# Patient Record
Sex: Female | Born: 1950 | Race: White | Hispanic: No | Smoking: Former smoker
Health system: Southern US, Community
[De-identification: ages and names within clinical notes are randomized; demographics above are authoritative.]

## PROBLEM LIST (undated history)

## (undated) DIAGNOSIS — Z972 Presence of dental prosthetic device (complete) (partial): Secondary | ICD-10-CM

## (undated) DIAGNOSIS — G4730 Sleep apnea, unspecified: Secondary | ICD-10-CM

## (undated) DIAGNOSIS — H409 Unspecified glaucoma: Secondary | ICD-10-CM

## (undated) DIAGNOSIS — M1990 Unspecified osteoarthritis, unspecified site: Secondary | ICD-10-CM

## (undated) DIAGNOSIS — R7303 Prediabetes: Secondary | ICD-10-CM

## (undated) DIAGNOSIS — Z98811 Dental restoration status: Secondary | ICD-10-CM

## (undated) DIAGNOSIS — K219 Gastro-esophageal reflux disease without esophagitis: Secondary | ICD-10-CM

## (undated) DIAGNOSIS — Z8489 Family history of other specified conditions: Secondary | ICD-10-CM

## (undated) DIAGNOSIS — J45909 Unspecified asthma, uncomplicated: Secondary | ICD-10-CM

## (undated) DIAGNOSIS — I10 Essential (primary) hypertension: Secondary | ICD-10-CM

## (undated) DIAGNOSIS — I38 Endocarditis, valve unspecified: Secondary | ICD-10-CM

## (undated) DIAGNOSIS — G5603 Carpal tunnel syndrome, bilateral upper limbs: Secondary | ICD-10-CM

## (undated) DIAGNOSIS — T7840XA Allergy, unspecified, initial encounter: Secondary | ICD-10-CM

## (undated) HISTORY — DX: Allergy, unspecified, initial encounter: T78.40XA

## (undated) HISTORY — DX: Unspecified glaucoma: H40.9

## (undated) HISTORY — PX: COLONOSCOPY: SHX174

## (undated) HISTORY — PX: TOOTH EXTRACTION: SUR596

---

## 2003-03-15 HISTORY — PX: IRIDOTOMY / IRIDECTOMY: SHX165

## 2005-01-12 ENCOUNTER — Ambulatory Visit: Payer: Self-pay | Admitting: Unknown Physician Specialty

## 2006-02-22 ENCOUNTER — Ambulatory Visit: Payer: Self-pay | Admitting: Unknown Physician Specialty

## 2006-07-20 ENCOUNTER — Emergency Department: Payer: Self-pay | Admitting: Emergency Medicine

## 2006-07-20 ENCOUNTER — Other Ambulatory Visit: Payer: Self-pay

## 2007-02-13 ENCOUNTER — Ambulatory Visit: Payer: Self-pay | Admitting: Specialist

## 2007-09-06 ENCOUNTER — Ambulatory Visit: Payer: Self-pay | Admitting: Unknown Physician Specialty

## 2007-10-18 ENCOUNTER — Ambulatory Visit: Payer: Self-pay | Admitting: Unknown Physician Specialty

## 2008-09-09 ENCOUNTER — Ambulatory Visit: Payer: Self-pay | Admitting: Unknown Physician Specialty

## 2008-09-18 ENCOUNTER — Ambulatory Visit: Payer: Self-pay | Admitting: Unknown Physician Specialty

## 2008-09-24 ENCOUNTER — Ambulatory Visit: Payer: Self-pay | Admitting: Unknown Physician Specialty

## 2009-11-18 ENCOUNTER — Ambulatory Visit: Payer: Self-pay | Admitting: Unknown Physician Specialty

## 2012-08-08 ENCOUNTER — Ambulatory Visit: Payer: Self-pay | Admitting: Internal Medicine

## 2012-11-16 ENCOUNTER — Ambulatory Visit: Payer: Self-pay | Admitting: Specialist

## 2013-01-21 ENCOUNTER — Ambulatory Visit: Payer: Self-pay | Admitting: Unknown Physician Specialty

## 2013-01-22 LAB — PATHOLOGY REPORT

## 2013-08-29 DIAGNOSIS — G4733 Obstructive sleep apnea (adult) (pediatric): Secondary | ICD-10-CM | POA: Insufficient documentation

## 2013-08-29 DIAGNOSIS — G4730 Sleep apnea, unspecified: Secondary | ICD-10-CM | POA: Insufficient documentation

## 2013-08-29 DIAGNOSIS — J45909 Unspecified asthma, uncomplicated: Secondary | ICD-10-CM | POA: Insufficient documentation

## 2014-03-28 ENCOUNTER — Ambulatory Visit: Payer: Self-pay | Admitting: Internal Medicine

## 2015-02-16 ENCOUNTER — Encounter: Payer: Self-pay | Admitting: Podiatry

## 2015-02-16 ENCOUNTER — Ambulatory Visit (INDEPENDENT_AMBULATORY_CARE_PROVIDER_SITE_OTHER): Payer: Medicare Other | Admitting: Podiatry

## 2015-02-16 VITALS — BP 127/86 | HR 69 | Resp 12

## 2015-02-16 DIAGNOSIS — L600 Ingrowing nail: Secondary | ICD-10-CM

## 2015-02-16 MED ORDER — NEOMYCIN-POLYMYXIN-HC 3.5-10000-1 OT SOLN
OTIC | Status: DC
Start: 2015-02-16 — End: 2016-09-06

## 2015-02-16 NOTE — Patient Instructions (Signed)

## 2015-02-16 NOTE — Progress Notes (Signed)
   Subjective:    Patient ID: Judith Arnold, female    DOB: 10-30-1950, 64 y.o.   MRN: [id number]  HPI: She presents today chief complaint of a painful ingrown toenail to the hallux left. She states it is been painful for the past month. She thinks that she has an ingrown toenail has been trying to treat it with Epsom salts and antibiotic ointments to no avail.    Review of Systems  HENT: Positive for sinus pressure.   Eyes: Positive for visual disturbance.  Respiratory: Positive for cough.   Musculoskeletal: Positive for gait problem.       Objective:   Physical Exam: 64 year old female vital signs stable alert and oriented 3 in no apparent distress. Pulses are strongly palpable. Neurologic sensorium is intact per Semmes-Weinstein monofilament. Deep tendon reflexes are intact. Muscle strength is 5 over 5 dorsiflexion plantar flexors and inverters everters all intrinsic musculature is intact. Orthopedic evaluation demonstrates all joints distal to the ankle for range of motion without crepitation. Cutaneous evaluation demonstrates supple well-hydrated cutis which sharp incurvated nail margin to the tibiofibular border of the hallux left with onychocryptosis. Mild paronychia is noted no purulence and no malodor.        Assessment & Plan:  Assessment ingrown nail paronychia abscess hallux left.  Plan: Discussed etiology pathology conservative versus surgical therapies. Chemical matrixectomy was performed to the tibial and fibular borders of the hallux left today after local anesthesia was diminished. She tolerated this procedure well and was given both oral and an home-going instructions for care and soaking of the toe. She was also provided a prescription for Cortisporin Otic to be applied twice daily after soaking notify us if there are any questions or concerns. Otherwise we will follow-up with her in 1 week.

## 2015-02-23 ENCOUNTER — Encounter: Payer: Self-pay | Admitting: Podiatry

## 2015-02-23 ENCOUNTER — Ambulatory Visit (INDEPENDENT_AMBULATORY_CARE_PROVIDER_SITE_OTHER): Payer: Medicare Other | Admitting: Podiatry

## 2015-02-23 DIAGNOSIS — L600 Ingrowing nail: Secondary | ICD-10-CM

## 2015-02-23 NOTE — Progress Notes (Signed)
She presents today status post matrixectomy hallux left 1 week. She states that it has a scab on it but seems to be doing well. She continues to soak in Betadine and warm water and apply Cortisporin Otic daily.  Objective: Vital signs are stable she is alert and oriented 3. Toe appears to be healing uneventfully. In that there is no erythema no cellulitis drainage or odor. Scab is present it is minimally tender on palpation.  Assessment: Well-healing surgical hallux matrixectomy left foot.  Plan: Discontinue Betadine sterile with Epsom salts and warm water soaks. Cover during the day and leave open at night time. I also encouraged her to continue to soak until this is completely resolved. She will continue to utilize the Cortisporin Otic until it is gone. All up with me should anything change for the worse or should she have questions or concerns.

## 2015-06-03 ENCOUNTER — Other Ambulatory Visit: Payer: Self-pay | Admitting: Internal Medicine

## 2015-06-03 DIAGNOSIS — Z1231 Encounter for screening mammogram for malignant neoplasm of breast: Secondary | ICD-10-CM

## 2015-06-15 ENCOUNTER — Ambulatory Visit: Payer: Self-pay

## 2015-06-16 ENCOUNTER — Ambulatory Visit
Admission: RE | Admit: 2015-06-16 | Discharge: 2015-06-16 | Disposition: A | Discharge status: Home / Self Care | Payer: Medicare Other | Source: Ambulatory Visit | Attending: Internal Medicine | Admitting: Internal Medicine

## 2015-06-16 ENCOUNTER — Other Ambulatory Visit: Payer: Self-pay | Admitting: Internal Medicine

## 2015-06-16 DIAGNOSIS — Z1231 Encounter for screening mammogram for malignant neoplasm of breast: Secondary | ICD-10-CM

## 2015-07-20 ENCOUNTER — Other Ambulatory Visit: Payer: Self-pay | Admitting: Nurse Practitioner

## 2015-07-20 DIAGNOSIS — R1310 Dysphagia, unspecified: Secondary | ICD-10-CM

## 2015-08-03 ENCOUNTER — Ambulatory Visit: Payer: Medicare Other

## 2015-08-31 ENCOUNTER — Ambulatory Visit
Admission: RE | Admit: 2015-08-31 | Discharge: 2015-08-31 | Disposition: A | Discharge status: Home / Self Care | Payer: Medicare Other | Source: Ambulatory Visit | Attending: Nurse Practitioner | Admitting: Nurse Practitioner

## 2015-08-31 DIAGNOSIS — R1310 Dysphagia, unspecified: Secondary | ICD-10-CM

## 2015-08-31 DIAGNOSIS — R1312 Dysphagia, oropharyngeal phase: Secondary | ICD-10-CM

## 2015-08-31 NOTE — Therapy (Signed)
[facility] DIAGNOSTIC RADIOLOGY [address] [place], [place], [address] Phone: [phone]   Fax:     Modified Barium Swallow  Patient Details  Name: Hennie DuosBarbara B Denicola MRN: [id number] Date of Birth: 1950/04/27 No Data Recorded  Encounter Date: 08/31/2015      End of Session - 08/31/15 1335    Visit Number 1   Number of Visits 1   Date for SLP Re-Evaluation 08/31/15   SLP Start Time 1230   SLP Stop Time  1330   SLP Time Calculation (min) 60 min   Activity Tolerance Patient tolerated treatment well      No past medical history on file.  No past surgical history on file.  There were no vitals filed for this visit.    Subjective: Patient behavior: (alertness, ability to follow instructions, etc.): Patient able to reoprt swallowing complaints and follow directions for this evaluation.  Chief complaint: coughing associated with drinking- usually in combination with another activity such as talking/laughing or turning her head.  The patient's subjective perception is aspiration into the lungs.     Objective:  Radiological Procedure: A videoflouroscopic evaluation of oral-preparatory, reflex initiation, and pharyngeal phases of the swallow was performed; as well as a screening of the upper esophageal phase.  I. POSTURE: Upright in MBS chair  II. VIEW: lateral  III. COMPENSATORY STRATEGIES: N/A  IV. BOLUSES ADMINISTERED:    Thin Liquid: 2 small sips; 3 rapid, consecutive sips; 2 sips with head turn   Nectar-thick Liquid: 1 moderate size sip    Puree: 2 teaspoon presentations   Mechanical Soft: 1/4 graham cracker in apple sauce  V. RESULTS OF EVALUATION: A. ORAL PREPARATORY PHASE: (The lips, tongue, and velum are observed for strength and coordination)       **Overall Severity Rating: Within normal limits  B. SWALLOW INITIATION/REFLEX: (The reflex is normal if "triggered" by the time the bolus reached the base of the  tongue)  **Overall Severity Rating: Mild; triggering at the valleculae for thick consistencies and while falling from the valleculae to the pyriform sinuses with thin liquid  C. PHARYNGEAL PHASE: (Pharyngeal function is normal if the bolus shows rapid, smooth, and continuous transit through the pharynx and there is no pharyngeal residue after the swallow)  **Overall Severity Rating: Within normal limits  D. LARYNGEAL PENETRATION: (Material entering into the laryngeal inlet/vestibule but not aspirated) None  E. ASPIRATION: None  F. ESOPHAGEAL PHASE: (Screening of the upper esophagus) In the cervical esophagus there is a finger-like protrusion along the posterior wall during swallow (does not impede flow of boluses) consistent with prominent cricopharyngeus.    ASSESSMENT: 65 year old woman, with coughing while drinking (patient perceives aspiration), is presenting with minimal oropharyngeal dysphagia.  Oral control of the bolus including oral hold, rotary mastication, and anterior to posterior transfer are within normal limits. Timing of the pharyngeal swallow is delayed, triggering at the valleculae for thick consistencies and while falling from the valleculae to the pyriform sinuses with thin liquid.   Pharyngeal stage of swallowing including tongue base retraction, hyolaryngeal excursion, epiglottic inversion, duration/amplitude of UES opening, and laryngeal vestibule closure at the height of the swallow are within normal limits.  There was no observed pharyngeal residue, laryngeal penetration, or aspiration.  The patient attempted to elicit the cough by turning her head and swallowing, but she did not cough.  In the cervical esophagus there is a finger-like protrusion along the posterior wall during swallow (does not impede flow  of boluses) consistent with prominent cricopharyngeus.  I do not think the patient's complaints are due to aspiration.  The patient may be experiencing laryngospasms.   Recommend referral to ENT for direct visualization of the pharynx/larynx.   PLAN/RECOMMENDATIONS:   A. Diet: Regular   B. Swallowing Precautions: N/A   C. Recommended consultation to: ENT   D. Therapy recommendations: N/A   E. Results and recommendations were discussed with the patient immediately following the study and the final report routed to the referring practitioner.         Oropharyngeal dysphagia  Dysphagia - Plan: DG OP Swallowing Func-Medicare/Speech Path, DG OP Swallowing Func-Medicare/Speech Path      G-Codes - September 05, 2015 1336    Functional Assessment Tool Used MBS, clinical judgment   Functional Limitations Swallowing   Swallow Current Status (Z6109) At least 1 percent but less than 20 percent impaired, limited or restricted   Swallow Goal Status (U0454) At least 1 percent but less than 20 percent impaired, limited or restricted   Swallow Discharge Status [phone]) At least 1 percent but less than 20 percent impaired, limited or restricted          Problem List Patient Active Problem List   Diagnosis Date Noted  . Airway hyperreactivity 08/29/2013  . Apnea, sleep 08/29/2013   Dollene Primrose, MS/CCC- SLP  Leandrew Koyanagi 09/05/2015, 1:37 PM   [facility] DIAGNOSTIC RADIOLOGY [address] [place], [place], [address] Phone: [phone]   Fax:     Name: JAMESA TEDRICK MRN: [id number] Date of Birth: 05-Dec-1950

## 2016-04-08 ENCOUNTER — Other Ambulatory Visit: Payer: Self-pay | Admitting: Internal Medicine

## 2016-04-08 DIAGNOSIS — Z1231 Encounter for screening mammogram for malignant neoplasm of breast: Secondary | ICD-10-CM

## 2016-06-20 ENCOUNTER — Ambulatory Visit: Payer: Medicare Other

## 2016-06-21 ENCOUNTER — Ambulatory Visit
Admission: RE | Admit: 2016-06-21 | Discharge: 2016-06-21 | Disposition: A | Discharge status: Home / Self Care | Payer: Medicare Other | Source: Ambulatory Visit | Attending: Internal Medicine | Admitting: Internal Medicine

## 2016-06-21 DIAGNOSIS — Z1231 Encounter for screening mammogram for malignant neoplasm of breast: Secondary | ICD-10-CM | POA: Diagnosis not present

## 2016-09-06 ENCOUNTER — Encounter: Payer: Self-pay | Admitting: *Deleted

## 2016-09-07 NOTE — Discharge Instructions (Signed)

## 2016-09-12 MED ORDER — SEVOFLURANE IN SOLN
RESPIRATORY_TRACT | Status: AC
  Filled 2016-09-12: qty 250

## 2016-09-14 MED ORDER — PROPOFOL 10 MG/ML IV BOLUS
INTRAVENOUS | Status: AC
  Filled 2016-09-14: qty 20

## 2016-11-09 ENCOUNTER — Encounter: Payer: Self-pay | Admitting: *Deleted

## 2016-11-16 ENCOUNTER — Ambulatory Visit: Payer: Medicare Other | Admitting: Anesthesiology

## 2016-11-16 ENCOUNTER — Ambulatory Visit
Admission: RE | Admit: 2016-11-16 | Discharge: 2016-11-16 | Disposition: A | Discharge status: Home / Self Care | Payer: Medicare Other | Source: Ambulatory Visit | Attending: Ophthalmology | Admitting: Ophthalmology

## 2016-11-16 ENCOUNTER — Encounter
Admission: RE | Disposition: A | Discharge status: Home / Self Care | Payer: Self-pay | Source: Ambulatory Visit | Attending: Ophthalmology

## 2016-11-16 DIAGNOSIS — Z87891 Personal history of nicotine dependence: Secondary | ICD-10-CM | POA: Diagnosis not present

## 2016-11-16 DIAGNOSIS — K219 Gastro-esophageal reflux disease without esophagitis: Secondary | ICD-10-CM | POA: Diagnosis not present

## 2016-11-16 DIAGNOSIS — J45909 Unspecified asthma, uncomplicated: Secondary | ICD-10-CM | POA: Insufficient documentation

## 2016-11-16 DIAGNOSIS — I10 Essential (primary) hypertension: Secondary | ICD-10-CM | POA: Diagnosis not present

## 2016-11-16 DIAGNOSIS — K4090 Unilateral inguinal hernia, without obstruction or gangrene, not specified as recurrent: Secondary | ICD-10-CM | POA: Diagnosis not present

## 2016-11-16 DIAGNOSIS — Z888 Allergy status to other drugs, medicaments and biological substances status: Secondary | ICD-10-CM | POA: Diagnosis not present

## 2016-11-16 DIAGNOSIS — M1990 Unspecified osteoarthritis, unspecified site: Secondary | ICD-10-CM | POA: Insufficient documentation

## 2016-11-16 DIAGNOSIS — Z91040 Latex allergy status: Secondary | ICD-10-CM | POA: Insufficient documentation

## 2016-11-16 DIAGNOSIS — R229 Localized swelling, mass and lump, unspecified: Secondary | ICD-10-CM | POA: Insufficient documentation

## 2016-11-16 DIAGNOSIS — H2511 Age-related nuclear cataract, right eye: Secondary | ICD-10-CM | POA: Insufficient documentation

## 2016-11-16 DIAGNOSIS — E7800 Pure hypercholesterolemia, unspecified: Secondary | ICD-10-CM | POA: Insufficient documentation

## 2016-11-16 DIAGNOSIS — G4730 Sleep apnea, unspecified: Secondary | ICD-10-CM | POA: Insufficient documentation

## 2016-11-16 DIAGNOSIS — E1136 Type 2 diabetes mellitus with diabetic cataract: Secondary | ICD-10-CM | POA: Diagnosis not present

## 2016-11-16 DIAGNOSIS — Z6841 Body Mass Index (BMI) 40.0 and over, adult: Secondary | ICD-10-CM | POA: Diagnosis not present

## 2016-11-16 HISTORY — DX: Dental restoration status: Z98.811

## 2016-11-16 HISTORY — DX: Sleep apnea, unspecified: G47.30

## 2016-11-16 HISTORY — DX: Carpal tunnel syndrome, bilateral upper limbs: G56.03

## 2016-11-16 HISTORY — DX: Gastro-esophageal reflux disease without esophagitis: K21.9

## 2016-11-16 HISTORY — DX: Prediabetes: R73.03

## 2016-11-16 HISTORY — PX: CATARACT EXTRACTION W/PHACO: SHX586

## 2016-11-16 HISTORY — DX: Unspecified asthma, uncomplicated: J45.909

## 2016-11-16 HISTORY — DX: Essential (primary) hypertension: I10

## 2016-11-16 HISTORY — DX: Unspecified osteoarthritis, unspecified site: M19.90

## 2016-11-16 HISTORY — DX: Family history of other specified conditions: Z84.89

## 2016-11-16 HISTORY — DX: Endocarditis, valve unspecified: I38

## 2016-11-16 HISTORY — DX: Presence of dental prosthetic device (complete) (partial): Z97.2

## 2016-11-16 LAB — GLUCOSE, CAPILLARY
Glucose-Capillary: 107 mg/dL — ABNORMAL HIGH (ref 65–99)
Glucose-Capillary: 116 mg/dL — ABNORMAL HIGH (ref 65–99)

## 2016-11-16 SURGERY — PHACOEMULSIFICATION, CATARACT, WITH IOL INSERTION
Anesthesia: Monitor Anesthesia Care | Laterality: Right | Wound class: Clean

## 2016-11-16 MED ORDER — MOXIFLOXACIN HCL 0.5 % OP SOLN
1.0000 [drp] | OPHTHALMIC | Status: DC | PRN
  Administered 2016-11-16 (×3): 1 [drp] via OPHTHALMIC

## 2016-11-16 MED ORDER — CEFUROXIME OPHTHALMIC INJECTION 1 MG/0.1 ML
INJECTION | OPHTHALMIC | Status: DC | PRN
  Administered 2016-11-16: 0.1 mL via OPHTHALMIC

## 2016-11-16 MED ORDER — BRIMONIDINE TARTRATE-TIMOLOL 0.2-0.5 % OP SOLN
OPHTHALMIC | Status: DC | PRN
  Administered 2016-11-16: 1 [drp] via OPHTHALMIC

## 2016-11-16 MED ORDER — ACETAMINOPHEN 325 MG PO TABS: 650.0000 mg | ORAL_TABLET | Freq: Once | ORAL | Status: DC | PRN

## 2016-11-16 MED ORDER — LIDOCAINE HCL (PF) 2 % IJ SOLN
INTRAMUSCULAR | Status: DC | PRN
  Administered 2016-11-16: 1 mL via INTRAOCULAR

## 2016-11-16 MED ORDER — ARMC OPHTHALMIC DILATING DROPS: 1.0000 "application " | OPHTHALMIC | Status: DC | PRN

## 2016-11-16 MED ORDER — ARMC OPHTHALMIC DILATING DROPS
1.0000 "application " | OPHTHALMIC | Status: DC | PRN
  Administered 2016-11-16 (×3): 1 via OPHTHALMIC

## 2016-11-16 MED ORDER — ONDANSETRON HCL 4 MG/2ML IJ SOLN: 4.0000 mg | Freq: Once | INTRAMUSCULAR | Status: DC | PRN

## 2016-11-16 MED ORDER — ACETAMINOPHEN 160 MG/5ML PO SOLN: 325.0000 mg | ORAL | Status: DC | PRN

## 2016-11-16 MED ORDER — FENTANYL CITRATE (PF) 100 MCG/2ML IJ SOLN
INTRAMUSCULAR | Status: DC | PRN
  Administered 2016-11-16: 50 ug via INTRAVENOUS

## 2016-11-16 MED ORDER — MOXIFLOXACIN HCL 0.5 % OP SOLN: 1.0000 [drp] | OPHTHALMIC | Status: DC | PRN

## 2016-11-16 MED ORDER — NA HYALUR & NA CHOND-NA HYALUR 0.4-0.35 ML IO KIT
PACK | INTRAOCULAR | Status: DC | PRN
  Administered 2016-11-16: 1 mL via INTRAOCULAR

## 2016-11-16 MED ORDER — MIDAZOLAM HCL 2 MG/2ML IJ SOLN
INTRAMUSCULAR | Status: DC | PRN
  Administered 2016-11-16: 2 mg via INTRAVENOUS

## 2016-11-16 MED ORDER — LACTATED RINGERS IV SOLN: INTRAVENOUS | Status: DC

## 2016-11-16 MED ORDER — EPINEPHRINE PF 1 MG/ML IJ SOLN
INTRAMUSCULAR | Status: DC | PRN
  Administered 2016-11-16: 64 mL via OPHTHALMIC

## 2016-11-16 SURGICAL SUPPLY — 27 items
CANNULA ANT/CHMB 27G (MISCELLANEOUS) ×1 IMPLANT
CANNULA ANT/CHMB 27GA (MISCELLANEOUS) ×2 IMPLANT
CARTRIDGE ABBOTT (MISCELLANEOUS) IMPLANT
GLOVE SURG LX 7.5 STRW (GLOVE) ×1
GLOVE SURG LX STRL 7.5 STRW (GLOVE) ×1 IMPLANT
GLOVE SURG TRIUMPH 8.0 PF LTX (GLOVE) ×2 IMPLANT
GOWN STRL REUS W/ TWL LRG LVL3 (GOWN DISPOSABLE) ×2 IMPLANT
GOWN STRL REUS W/TWL LRG LVL3 (GOWN DISPOSABLE) ×4
LENS IOL TECNIS ITEC 24.0 (Intraocular Lens) ×1 IMPLANT
MARKER SKIN DUAL TIP RULER LAB (MISCELLANEOUS) ×2 IMPLANT
NDL FILTER BLUNT 18X1 1/2 (NEEDLE) ×1 IMPLANT
NDL RETROBULBAR .5 NSTRL (NEEDLE) IMPLANT
NEEDLE FILTER BLUNT 18X 1/2SAF (NEEDLE) ×1
NEEDLE FILTER BLUNT 18X1 1/2 (NEEDLE) ×1 IMPLANT
PACK CATARACT BRASINGTON (MISCELLANEOUS) ×2 IMPLANT
PACK EYE AFTER SURG (MISCELLANEOUS) ×2 IMPLANT
PACK OPTHALMIC (MISCELLANEOUS) ×2 IMPLANT
RING MALYGIN 7.0 (MISCELLANEOUS) IMPLANT
SUT ETHILON 10-0 CS-B-6CS-B-6 (SUTURE)
SUT VICRYL  9 0 (SUTURE)
SUT VICRYL 9 0 (SUTURE) IMPLANT
SUTURE EHLN 10-0 CS-B-6CS-B-6 (SUTURE) IMPLANT
SYR 3ML LL SCALE MARK (SYRINGE) ×2 IMPLANT
SYR 5ML LL (SYRINGE) ×2 IMPLANT
SYR TB 1ML LUER SLIP (SYRINGE) ×2 IMPLANT
WATER STERILE IRR 250ML POUR (IV SOLUTION) ×2 IMPLANT
WIPE NON LINTING 3.25X3.25 (MISCELLANEOUS) ×2 IMPLANT

## 2016-11-16 NOTE — Anesthesia Postprocedure Evaluation (Signed)
Anesthesia Post Note  Patient: Judith Arnold  Procedure(s) Performed: Procedure(s) (LRB): CATARACT EXTRACTION PHACO AND INTRAOCULAR LENS PLACEMENT (IOC)  right (Right)  Patient location during evaluation: PACU Anesthesia Type: MAC Level of consciousness: awake Pain management: pain level controlled Vital Signs Assessment: post-procedure vital signs reviewed and stable Respiratory status: spontaneous breathing, nonlabored ventilation and respiratory function stable Cardiovascular status: stable Postop Assessment: no signs of nausea or vomiting Anesthetic complications: no    Veda Canning

## 2016-11-16 NOTE — H&P (Signed)
The History and Physical notes are on paper, have been signed, and are to be scanned. The patient remains stable and unchanged from the H&P.   Previous H&P reviewed, patient examined, and there are no changes.  Judith Arnold 11/16/2016 8:45 AM

## 2016-11-16 NOTE — Anesthesia Procedure Notes (Signed)
Procedure Name: MAC Performed by: Jahlen Bollman Pre-anesthesia Checklist: Patient identified, Emergency Drugs available, Suction available, Timeout performed and Patient being monitored Patient Re-evaluated:Patient Re-evaluated prior to inductionOxygen Delivery Method: Nasal cannula Placement Confirmation: positive ETCO2     

## 2016-11-16 NOTE — Op Note (Signed)
LOCATION:  [facility]   PREOPERATIVE DIAGNOSIS:    Nuclear sclerotic cataract right eye. H25.11   POSTOPERATIVE DIAGNOSIS:  Nuclear sclerotic cataract right eye.     PROCEDURE:  Phacoemusification with posterior chamber intraocular lens placement of the right eye   LENS:   Implant Name Type Inv. Item Serial No. Manufacturer Lot No. LRB No. Used  LENS IOL DIOP 24.0 - [id number] Intraocular Lens LENS IOL DIOP 24.0 [id number]   Right 1        ULTRASOUND TIME: 16 % of 1 minutes, 19 seconds.  CDE 12.7   SURGEON:  Deirdre Evenerhadwick R. Ioane Bhola, MD   ANESTHESIA:  Topical with tetracaine drops and 2% Xylocaine jelly, augmented with 1% preservative-free intracameral lidocaine.    COMPLICATIONS:  None.   DESCRIPTION OF PROCEDURE:  The patient was identified in the holding room and transported to the operating room and placed in the supine position under the operating microscope.  The right eye was identified as the operative eye and it was prepped and draped in the usual sterile ophthalmic fashion.   A 1 millimeter clear-corneal paracentesis was made at the 12:00 position.  0.5 ml of preservative-free 1% lidocaine was injected into the anterior chamber. The anterior chamber was filled with Viscoat viscoelastic.  A 2.4 millimeter keratome was used to make a near-clear corneal incision at the 9:00 position.  A curvilinear capsulorrhexis was made with a cystotome and capsulorrhexis forceps.  Balanced salt solution was used to hydrodissect and hydrodelineate the nucleus.   Phacoemulsification was then used in stop and chop fashion to remove the lens nucleus and epinucleus.  The remaining cortex was then removed using the irrigation and aspiration handpiece. Provisc was then placed into the capsular bag to distend it for lens placement.  A lens was then injected into the capsular bag.  The remaining viscoelastic was aspirated.   Wounds were hydrated with balanced salt solution.  The anterior  chamber was inflated to a physiologic pressure with balanced salt solution.  No wound leaks were noted. Cefuroxime 0.1 ml of a 10mg /ml solution was injected into the anterior chamber for a dose of 1 mg of intracameral antibiotic at the completion of the case.   Timolol and Brimonidine drops were applied to the eye.  The patient was taken to the recovery room in stable condition without complications of anesthesia or surgery.   Kelis Plasse 11/16/2016, 10:40 AM

## 2016-11-16 NOTE — Anesthesia Preprocedure Evaluation (Addendum)
Anesthesia Evaluation  Patient identified by MRN, date of birth, ID band Patient awake    Reviewed: Allergy & Precautions, NPO status , Patient's Chart, lab work & pertinent test results  Airway Mallampati: II  TM Distance: >3 FB     Dental   Pulmonary asthma , sleep apnea , former smoker,    breath sounds clear to auscultation       Cardiovascular hypertension,  Rhythm:Regular Rate:Normal     Neuro/Psych    GI/Hepatic GERD  ,  Endo/Other  Morbid obesity  Renal/GU      Musculoskeletal  (+) Arthritis ,   Abdominal   Peds  Hematology   Anesthesia Other Findings   Reproductive/Obstetrics                            Anesthesia Physical Anesthesia Plan  ASA: III  Anesthesia Plan: MAC   Post-op Pain Management:    Induction:   PONV Risk Score and Plan:   Airway Management Planned:   Additional Equipment:   Intra-op Plan:   Post-operative Plan:   Informed Consent: I have reviewed the patients History and Physical, chart, labs and discussed the procedure including the risks, benefits and alternatives for the proposed anesthesia with the patient or authorized representative who has indicated his/her understanding and acceptance.     Plan Discussed with: CRNA  Anesthesia Plan Comments:         Anesthesia Quick Evaluation

## 2016-11-16 NOTE — Transfer of Care (Signed)
Immediate Anesthesia Transfer of Care Note  Patient: Judith Arnold  Procedure(s) Performed: Procedure(s) with comments: CATARACT EXTRACTION PHACO AND INTRAOCULAR LENS PLACEMENT (IOC)  right (Right) - pre diabetic latex sensitivity sleep apnea  Patient Location: PACU  Anesthesia Type: MAC  Level of Consciousness: awake, alert  and patient cooperative  Airway and Oxygen Therapy: Patient Spontanous Breathing and Patient connected to supplemental oxygen  Post-op Assessment: Post-op Vital signs reviewed, Patient's Cardiovascular Status Stable, Respiratory Function Stable, Patent Airway and No signs of Nausea or vomiting  Post-op Vital Signs: Reviewed and stable  Complications: No apparent anesthesia complications

## 2016-11-17 ENCOUNTER — Encounter: Payer: Self-pay | Admitting: Ophthalmology

## 2017-01-12 NOTE — Discharge Instructions (Signed)

## 2017-01-18 ENCOUNTER — Ambulatory Visit: Payer: Medicare Other | Admitting: Anesthesiology

## 2017-01-18 ENCOUNTER — Ambulatory Visit
Admission: RE | Admit: 2017-01-18 | Discharge: 2017-01-18 | Disposition: A | Discharge status: Home / Self Care | Payer: Medicare Other | Source: Ambulatory Visit | Attending: Ophthalmology | Admitting: Ophthalmology

## 2017-01-18 ENCOUNTER — Encounter
Admission: RE | Disposition: A | Discharge status: Home / Self Care | Payer: Self-pay | Source: Ambulatory Visit | Attending: Ophthalmology

## 2017-01-18 DIAGNOSIS — I10 Essential (primary) hypertension: Secondary | ICD-10-CM | POA: Diagnosis not present

## 2017-01-18 DIAGNOSIS — J45909 Unspecified asthma, uncomplicated: Secondary | ICD-10-CM | POA: Diagnosis not present

## 2017-01-18 DIAGNOSIS — K219 Gastro-esophageal reflux disease without esophagitis: Secondary | ICD-10-CM | POA: Insufficient documentation

## 2017-01-18 DIAGNOSIS — H2512 Age-related nuclear cataract, left eye: Secondary | ICD-10-CM | POA: Diagnosis not present

## 2017-01-18 DIAGNOSIS — G4730 Sleep apnea, unspecified: Secondary | ICD-10-CM | POA: Diagnosis not present

## 2017-01-18 DIAGNOSIS — Z87891 Personal history of nicotine dependence: Secondary | ICD-10-CM | POA: Insufficient documentation

## 2017-01-18 HISTORY — PX: CATARACT EXTRACTION W/PHACO: SHX586

## 2017-01-18 LAB — GLUCOSE, CAPILLARY
Glucose-Capillary: 112 mg/dL — ABNORMAL HIGH (ref 65–99)
Glucose-Capillary: 121 mg/dL — ABNORMAL HIGH (ref 65–99)

## 2017-01-18 SURGERY — PHACOEMULSIFICATION, CATARACT, WITH IOL INSERTION
Anesthesia: Topical | Laterality: Left | Wound class: Clean

## 2017-01-18 MED ORDER — BRIMONIDINE TARTRATE-TIMOLOL 0.2-0.5 % OP SOLN
OPHTHALMIC | Status: DC | PRN
  Administered 2017-01-18: 1 [drp] via OPHTHALMIC

## 2017-01-18 MED ORDER — LIDOCAINE HCL (PF) 2 % IJ SOLN
INTRAOCULAR | Status: DC | PRN
  Administered 2017-01-18: 1 mL

## 2017-01-18 MED ORDER — NA HYALUR & NA CHOND-NA HYALUR 0.4-0.35 ML IO KIT
PACK | INTRAOCULAR | Status: DC | PRN
  Administered 2017-01-18: 1 mL via INTRAOCULAR

## 2017-01-18 MED ORDER — MOXIFLOXACIN HCL 0.5 % OP SOLN
1.0000 [drp] | OPHTHALMIC | Status: DC | PRN
  Administered 2017-01-18 (×3): 1 [drp] via OPHTHALMIC

## 2017-01-18 MED ORDER — MIDAZOLAM HCL 2 MG/2ML IJ SOLN
INTRAMUSCULAR | Status: DC | PRN
  Administered 2017-01-18: 2 mg via INTRAVENOUS

## 2017-01-18 MED ORDER — ARMC OPHTHALMIC DILATING DROPS
1.0000 "application " | OPHTHALMIC | Status: DC | PRN
  Administered 2017-01-18 (×3): 1 via OPHTHALMIC

## 2017-01-18 MED ORDER — LACTATED RINGERS IV SOLN: 1000.0000 mL | INTRAVENOUS | Status: DC

## 2017-01-18 MED ORDER — FENTANYL CITRATE (PF) 100 MCG/2ML IJ SOLN
INTRAMUSCULAR | Status: DC | PRN
  Administered 2017-01-18: 50 ug via INTRAVENOUS

## 2017-01-18 MED ORDER — CEFUROXIME OPHTHALMIC INJECTION 1 MG/0.1 ML
INJECTION | OPHTHALMIC | Status: DC | PRN
  Administered 2017-01-18: 0.1 mL via INTRACAMERAL

## 2017-01-18 SURGICAL SUPPLY — 27 items
CANNULA ANT/CHMB 27G (MISCELLANEOUS) ×1 IMPLANT
CANNULA ANT/CHMB 27GA (MISCELLANEOUS) ×2 IMPLANT
CARTRIDGE ABBOTT (MISCELLANEOUS) IMPLANT
GLOVE SURG LX 7.5 STRW (GLOVE) ×1
GLOVE SURG LX STRL 7.5 STRW (GLOVE) ×1 IMPLANT
GLOVE SURG TRIUMPH 8.0 PF LTX (GLOVE) ×2 IMPLANT
GOWN STRL REUS W/ TWL LRG LVL3 (GOWN DISPOSABLE) ×2 IMPLANT
GOWN STRL REUS W/TWL LRG LVL3 (GOWN DISPOSABLE) ×4
LENS IOL TECNIS ITEC 24.5 (Intraocular Lens) ×1 IMPLANT
MARKER SKIN DUAL TIP RULER LAB (MISCELLANEOUS) ×2 IMPLANT
NDL FILTER BLUNT 18X1 1/2 (NEEDLE) ×1 IMPLANT
NDL RETROBULBAR .5 NSTRL (NEEDLE) IMPLANT
NEEDLE FILTER BLUNT 18X 1/2SAF (NEEDLE) ×1
NEEDLE FILTER BLUNT 18X1 1/2 (NEEDLE) ×1 IMPLANT
PACK CATARACT BRASINGTON (MISCELLANEOUS) ×2 IMPLANT
PACK EYE AFTER SURG (MISCELLANEOUS) ×2 IMPLANT
PACK OPTHALMIC (MISCELLANEOUS) ×2 IMPLANT
RING MALYGIN 7.0 (MISCELLANEOUS) IMPLANT
SUT ETHILON 10-0 CS-B-6CS-B-6 (SUTURE)
SUT VICRYL  9 0 (SUTURE)
SUT VICRYL 9 0 (SUTURE) IMPLANT
SUTURE EHLN 10-0 CS-B-6CS-B-6 (SUTURE) IMPLANT
SYR 3ML LL SCALE MARK (SYRINGE) ×2 IMPLANT
SYR 5ML LL (SYRINGE) ×2 IMPLANT
SYR TB 1ML LUER SLIP (SYRINGE) ×2 IMPLANT
WATER STERILE IRR 250ML POUR (IV SOLUTION) ×2 IMPLANT
WIPE NON LINTING 3.25X3.25 (MISCELLANEOUS) ×2 IMPLANT

## 2017-01-18 NOTE — Anesthesia Postprocedure Evaluation (Signed)
Anesthesia Post Note  Patient: Judith Arnold  Procedure(s) Performed: CATARACT EXTRACTION PHACO AND INTRAOCULAR LENS PLACEMENT (IOC) LEFT DIABETIC (Left )  Patient location during evaluation: PACU Anesthesia Type: MAC Level of consciousness: awake and alert Pain management: pain level controlled Vital Signs Assessment: post-procedure vital signs reviewed and stable Respiratory status: spontaneous breathing, nonlabored ventilation, respiratory function stable and patient connected to nasal cannula oxygen Cardiovascular status: stable and blood pressure returned to baseline Postop Assessment: no apparent nausea or vomiting Anesthetic complications: no    Alisa Graff

## 2017-01-18 NOTE — Op Note (Signed)
OPERATIVE NOTE  Judith DuosBarbara B Makar [id number] 01/18/2017   PREOPERATIVE DIAGNOSIS:  Nuclear sclerotic cataract left eye. H25.12   POSTOPERATIVE DIAGNOSIS:    Nuclear sclerotic cataract left eye.     PROCEDURE:  Phacoemusification with posterior chamber intraocular lens placement of the left eye   LENS:   Implant Name Type Inv. Item Serial No. Manufacturer Lot No. LRB No. Used  LENS IOL DIOP 24.5 - [id number] Intraocular Lens LENS IOL DIOP 24.5 [id number]  Left 1        ULTRASOUND TIME: 16  % of 1 minutes 14 seconds, CDE 12.0  SURGEON:  Deirdre Evenerhadwick R. Kylan Liberati, MD   ANESTHESIA:  Topical with tetracaine drops and 2% Xylocaine jelly, augmented with 1% preservative-free intracameral lidocaine.    COMPLICATIONS:  None.   DESCRIPTION OF PROCEDURE:  The patient was identified in the holding room and transported to the operating room and placed in the supine position under the operating microscope.  The left eye was identified as the operative eye and it was prepped and draped in the usual sterile ophthalmic fashion.   A 1 millimeter clear-corneal paracentesis was made at the 1:30 position.  0.5 ml of preservative-free 1% lidocaine was injected into the anterior chamber.  The anterior chamber was filled with Viscoat viscoelastic.  A 2.4 millimeter keratome was used to make a near-clear corneal incision at the 10:30 position.  .  A curvilinear capsulorrhexis was made with a cystotome and capsulorrhexis forceps.  Balanced salt solution was used to hydrodissect and hydrodelineate the nucleus.   Phacoemulsification was then used in stop and chop fashion to remove the lens nucleus and epinucleus.  The remaining cortex was then removed using the irrigation and aspiration handpiece. Provisc was then placed into the capsular bag to distend it for lens placement.  A lens was then injected into the capsular bag.  The remaining viscoelastic was aspirated.   Wounds were hydrated with balanced salt  solution.  The anterior chamber was inflated to a physiologic pressure with balanced salt solution.  No wound leaks were noted. Cefuroxime 0.1 ml of a 10mg /ml solution was injected into the anterior chamber for a dose of 1 mg of intracameral antibiotic at the completion of the case.   Timolol and Brimonidine drops were applied to the eye.  The patient was taken to the recovery room in stable condition without complications of anesthesia or surgery.  [name] 01/18/2017, 8:09 AM

## 2017-01-18 NOTE — Anesthesia Preprocedure Evaluation (Signed)
Anesthesia Evaluation  Patient identified by MRN, date of birth, ID band Patient awake    Reviewed: Allergy & Precautions, H&P , NPO status , Patient's Chart, lab work & pertinent test results, reviewed documented beta blocker date and time   Airway Mallampati: II  TM Distance: >3 FB Neck ROM: full    Dental no notable dental hx.    Pulmonary asthma , sleep apnea , former smoker,    Pulmonary exam normal breath sounds clear to auscultation       Cardiovascular Exercise Tolerance: Good hypertension, negative cardio ROS   Rhythm:regular Rate:Normal     Neuro/Psych negative neurological ROS  negative psych ROS   GI/Hepatic Neg liver ROS, GERD  ,  Endo/Other  negative endocrine ROS  Renal/GU negative Renal ROS  negative genitourinary   Musculoskeletal   Abdominal   Peds  Hematology negative hematology ROS (+)   Anesthesia Other Findings   Reproductive/Obstetrics negative OB ROS                             Anesthesia Physical Anesthesia Plan  ASA: II  Anesthesia Plan:    Post-op Pain Management:    Induction:   PONV Risk Score and Plan: 2  Airway Management Planned:   Additional Equipment:   Intra-op Plan:   Post-operative Plan:   Informed Consent: I have reviewed the patients History and Physical, chart, labs and discussed the procedure including the risks, benefits and alternatives for the proposed anesthesia with the patient or authorized representative who has indicated his/her understanding and acceptance.   Dental Advisory Given  Plan Discussed with: CRNA  Anesthesia Plan Comments:         Anesthesia Quick Evaluation

## 2017-01-18 NOTE — Anesthesia Procedure Notes (Signed)
Procedure Name: MAC Performed by: Londell Moh, CRNA Pre-anesthesia Checklist: Patient identified, Emergency Drugs available, Suction available, Timeout performed and Patient being monitored Patient Re-evaluated:Patient Re-evaluated prior to induction Oxygen Delivery Method: Nasal cannula Placement Confirmation: positive ETCO2

## 2017-01-18 NOTE — Transfer of Care (Signed)
Immediate Anesthesia Transfer of Care Note  Patient: Judith Arnold  Procedure(s) Performed: CATARACT EXTRACTION PHACO AND INTRAOCULAR LENS PLACEMENT (IOC) LEFT DIABETIC (Left )  Patient Location: PACU  Anesthesia Type: No value filed.  Level of Consciousness: awake, alert  and patient cooperative  Airway and Oxygen Therapy: Patient Spontanous Breathing and Patient connected to supplemental oxygen  Post-op Assessment: Post-op Vital signs reviewed, Patient's Cardiovascular Status Stable, Respiratory Function Stable, Patent Airway and No signs of Nausea or vomiting  Post-op Vital Signs: Reviewed and stable  Complications: No apparent anesthesia complications

## 2017-01-18 NOTE — H&P (Signed)
The History and Physical notes are on paper, have been signed, and are to be scanned. The patient remains stable and unchanged from the H&P.   Previous H&P reviewed, patient examined, and there are no changes.  Judith Arnold 01/18/2017 7:41 AM

## 2017-01-19 ENCOUNTER — Encounter: Payer: Self-pay | Admitting: Ophthalmology

## 2017-05-08 ENCOUNTER — Other Ambulatory Visit: Payer: Self-pay | Admitting: Internal Medicine

## 2017-05-08 DIAGNOSIS — Z1231 Encounter for screening mammogram for malignant neoplasm of breast: Secondary | ICD-10-CM

## 2017-06-23 ENCOUNTER — Ambulatory Visit
Admission: RE | Admit: 2017-06-23 | Discharge: 2017-06-23 | Disposition: A | Discharge status: Home / Self Care | Payer: Medicare Other | Source: Ambulatory Visit | Attending: Internal Medicine | Admitting: Internal Medicine

## 2017-06-23 DIAGNOSIS — Z1231 Encounter for screening mammogram for malignant neoplasm of breast: Secondary | ICD-10-CM

## 2018-03-30 ENCOUNTER — Other Ambulatory Visit: Payer: Self-pay | Admitting: Family

## 2018-03-30 DIAGNOSIS — Z1231 Encounter for screening mammogram for malignant neoplasm of breast: Secondary | ICD-10-CM

## 2018-04-06 ENCOUNTER — Encounter: Payer: Self-pay | Admitting: Anesthesiology

## 2018-04-09 ENCOUNTER — Ambulatory Visit: Payer: Medicare Other | Admitting: Anesthesiology

## 2018-04-09 ENCOUNTER — Ambulatory Visit
Admission: RE | Admit: 2018-04-09 | Discharge: 2018-04-09 | Disposition: A | Discharge status: Home / Self Care | Payer: Medicare Other | Attending: Unknown Physician Specialty | Admitting: Unknown Physician Specialty

## 2018-04-09 ENCOUNTER — Encounter
Admission: RE | Disposition: A | Discharge status: Home / Self Care | Payer: Self-pay | Source: Home / Self Care | Attending: Unknown Physician Specialty

## 2018-04-09 ENCOUNTER — Encounter: Payer: Self-pay | Admitting: Anesthesiology

## 2018-04-09 DIAGNOSIS — Z7951 Long term (current) use of inhaled steroids: Secondary | ICD-10-CM | POA: Diagnosis not present

## 2018-04-09 DIAGNOSIS — Z7982 Long term (current) use of aspirin: Secondary | ICD-10-CM | POA: Insufficient documentation

## 2018-04-09 DIAGNOSIS — Z7984 Long term (current) use of oral hypoglycemic drugs: Secondary | ICD-10-CM | POA: Insufficient documentation

## 2018-04-09 DIAGNOSIS — Z87891 Personal history of nicotine dependence: Secondary | ICD-10-CM | POA: Diagnosis not present

## 2018-04-09 DIAGNOSIS — Z791 Long term (current) use of non-steroidal anti-inflammatories (NSAID): Secondary | ICD-10-CM | POA: Diagnosis not present

## 2018-04-09 DIAGNOSIS — K5730 Diverticulosis of large intestine without perforation or abscess without bleeding: Secondary | ICD-10-CM | POA: Diagnosis not present

## 2018-04-09 DIAGNOSIS — J45909 Unspecified asthma, uncomplicated: Secondary | ICD-10-CM | POA: Insufficient documentation

## 2018-04-09 DIAGNOSIS — E669 Obesity, unspecified: Secondary | ICD-10-CM | POA: Diagnosis not present

## 2018-04-09 DIAGNOSIS — Z8371 Family history of colonic polyps: Secondary | ICD-10-CM | POA: Insufficient documentation

## 2018-04-09 DIAGNOSIS — G4730 Sleep apnea, unspecified: Secondary | ICD-10-CM | POA: Insufficient documentation

## 2018-04-09 DIAGNOSIS — Z79899 Other long term (current) drug therapy: Secondary | ICD-10-CM | POA: Insufficient documentation

## 2018-04-09 DIAGNOSIS — K219 Gastro-esophageal reflux disease without esophagitis: Secondary | ICD-10-CM | POA: Diagnosis not present

## 2018-04-09 DIAGNOSIS — Z6838 Body mass index (BMI) 38.0-38.9, adult: Secondary | ICD-10-CM | POA: Diagnosis not present

## 2018-04-09 DIAGNOSIS — K640 First degree hemorrhoids: Secondary | ICD-10-CM | POA: Diagnosis not present

## 2018-04-09 DIAGNOSIS — I10 Essential (primary) hypertension: Secondary | ICD-10-CM | POA: Insufficient documentation

## 2018-04-09 DIAGNOSIS — Z1211 Encounter for screening for malignant neoplasm of colon: Secondary | ICD-10-CM | POA: Insufficient documentation

## 2018-04-09 HISTORY — PX: COLONOSCOPY WITH PROPOFOL: SHX5780

## 2018-04-09 LAB — GLUCOSE, CAPILLARY: Glucose-Capillary: 92 mg/dL (ref 70–99)

## 2018-04-09 SURGERY — COLONOSCOPY WITH PROPOFOL
Anesthesia: General

## 2018-04-09 MED ORDER — PROPOFOL 500 MG/50ML IV EMUL
INTRAVENOUS | Status: DC | PRN
  Administered 2018-04-09: 120 ug/kg/min via INTRAVENOUS

## 2018-04-09 MED ORDER — FENTANYL CITRATE (PF) 100 MCG/2ML IJ SOLN
INTRAMUSCULAR | Status: DC | PRN
  Administered 2018-04-09: 50 ug via INTRAVENOUS

## 2018-04-09 MED ORDER — MIDAZOLAM HCL 2 MG/2ML IJ SOLN
INTRAMUSCULAR | Status: DC | PRN
  Administered 2018-04-09: 2 mg via INTRAVENOUS

## 2018-04-09 MED ORDER — MIDAZOLAM HCL 2 MG/2ML IJ SOLN
INTRAMUSCULAR | Status: AC
  Filled 2018-04-09: qty 2

## 2018-04-09 MED ORDER — PROPOFOL 500 MG/50ML IV EMUL
INTRAVENOUS | Status: AC
  Filled 2018-04-09: qty 50

## 2018-04-09 MED ORDER — FENTANYL CITRATE (PF) 100 MCG/2ML IJ SOLN
INTRAMUSCULAR | Status: AC
  Filled 2018-04-09: qty 2

## 2018-04-09 MED ORDER — SODIUM CHLORIDE 0.9 % IV SOLN
INTRAVENOUS | Status: DC
  Administered 2018-04-09: 09:00:00 via INTRAVENOUS

## 2018-04-09 NOTE — Transfer of Care (Signed)
Immediate Anesthesia Transfer of Care Note  Patient: Judith Arnold  Procedure(s) Performed: COLONOSCOPY WITH PROPOFOL (N/A )  Patient Location: PACU  Anesthesia Type:General  Level of Consciousness: awake and sedated  Airway & Oxygen Therapy: Patient Spontanous Breathing and Patient connected to nasal cannula oxygen  Post-op Assessment: Report given to RN and Post -op Vital signs reviewed and stable  Post vital signs: Reviewed and stable  Last Vitals:  Vitals Value Taken Time  BP    Temp    Pulse    Resp    SpO2      Last Pain:  Vitals:   04/09/18 0901  TempSrc: Tympanic         Complications: No apparent anesthesia complications

## 2018-04-09 NOTE — Anesthesia Postprocedure Evaluation (Signed)
Anesthesia Post Note  Patient: Judith Arnold  Procedure(s) Performed: COLONOSCOPY WITH PROPOFOL (N/A )  Patient location during evaluation: Endoscopy Anesthesia Type: General Level of consciousness: awake and alert Pain management: pain level controlled Vital Signs Assessment: post-procedure vital signs reviewed and stable Respiratory status: spontaneous breathing, nonlabored ventilation, respiratory function stable and patient connected to nasal cannula oxygen Cardiovascular status: blood pressure returned to baseline and stable Postop Assessment: no apparent nausea or vomiting Anesthetic complications: no     Last Vitals:  Vitals:   04/09/18 1021 04/09/18 1041  BP:  122/65  Pulse: 73   Resp:    Temp: (!) 36.1 C   SpO2: 100%     Last Pain:  Vitals:   04/09/18 1139  TempSrc:   PainSc: 0-No pain                 Cole Eastridge S

## 2018-04-09 NOTE — Anesthesia Preprocedure Evaluation (Signed)
Anesthesia Evaluation  Patient identified by MRN, date of birth, ID band Patient awake    Reviewed: Allergy & Precautions, NPO status , Patient's Chart, lab work & pertinent test results, reviewed documented beta blocker date and time   History of Anesthesia Complications (+) Family history of anesthesia reaction  Airway Mallampati: III  TM Distance: >3 FB     Dental  (+) Chipped   Pulmonary asthma , sleep apnea , former smoker,           Cardiovascular hypertension, Pt. on medications      Neuro/Psych  Neuromuscular disease    GI/Hepatic GERD  ,  Endo/Other    Renal/GU      Musculoskeletal  (+) Arthritis ,   Abdominal   Peds  Hematology   Anesthesia Other Findings Obese.  Reproductive/Obstetrics                             Anesthesia Physical Anesthesia Plan  ASA: III  Anesthesia Plan: General   Post-op Pain Management:    Induction: Intravenous  PONV Risk Score and Plan:   Airway Management Planned:   Additional Equipment:   Intra-op Plan:   Post-operative Plan:   Informed Consent: I have reviewed the patients History and Physical, chart, labs and discussed the procedure including the risks, benefits and alternatives for the proposed anesthesia with the patient or authorized representative who has indicated his/her understanding and acceptance.       Plan Discussed with: CRNA  Anesthesia Plan Comments:         Anesthesia Quick Evaluation

## 2018-04-09 NOTE — Op Note (Signed)
[facility] Gastroenterology Patient Name: Judith Arnold Procedure Date: 04/09/2018 10:01 AM MRN: [id number] Account #: [id number] Date of Birth: [birth year] Admit Type: Outpatient Age: 68 Room: [facility] ENDO ROOM 1 Gender: Female Note Status: Finalized Procedure:            Colonoscopy Indications:          Colon cancer screening in patient at increased risk:                        Family history of 1st-degree relative with colon polyps Providers:            Scot Jun, MD Referring MD:         Margaretann Loveless, MD (Referring MD) Medicines:            Propofol per Anesthesia Complications:        No immediate complications. Procedure:            Pre-Anesthesia Assessment:                       - After reviewing the risks and benefits, the patient                        was deemed in satisfactory condition to undergo the                        procedure.                       After obtaining informed consent, the colonoscope was                        passed under direct vision. Throughout the procedure,                        the patient's blood pressure, pulse, and oxygen                        saturations were monitored continuously. The                        Colonoscope was introduced through the anus and                        advanced to the the cecum, identified by appendiceal                        orifice and ileocecal valve. The colonoscopy was                        performed without difficulty. The patient tolerated the                        procedure well. The quality of the bowel preparation                        was good. Findings:      Many small-mouthed diverticula were found in the sigmoid colon.      Internal hemorrhoids were found during endoscopy. The hemorrhoids were       small and Grade I (internal hemorrhoids that do not prolapse).  The exam was otherwise without abnormality. Impression:           - Diverticulosis in the sigmoid  colon.                       - Internal hemorrhoids.                       - The examination was otherwise normal.                       - No specimens collected. Recommendation:       - Repeat colonoscopy in 10 years for screening purposes. Scot Junobert T Trenity Pha, MD 04/09/2018 10:22:24 AM This report has been signed electronically. Number of Addenda: 0 Note Initiated On: 04/09/2018 10:01 AM Scope Withdrawal Time: 0 hours 4 minutes 26 seconds  Total Procedure Duration: 0 hours 10 minutes 27 seconds       [facility]

## 2018-04-09 NOTE — Anesthesia Procedure Notes (Addendum)
Performed by: Cook-Martin, Zania Kalisz Pre-anesthesia Checklist: Patient identified, Emergency Drugs available, Suction available, Patient being monitored and Timeout performed Patient Re-evaluated:Patient Re-evaluated prior to induction Oxygen Delivery Method: Simple face mask Preoxygenation: Pre-oxygenation with 100% oxygen Induction Type: IV induction Ventilation: Oral airway inserted - appropriate to patient size Placement Confirmation: positive ETCO2 and CO2 detector       

## 2018-04-09 NOTE — H&P (Signed)
Primary Care Physician:  Margaretann Loveless, MD Primary Gastroenterologist:  Dr. Mechele Collin  Pre-Procedure History & Physical: HPI:  Judith Arnold is a 68 y.o. female is here for an colonoscopy.   Past Medical History:  Diagnosis Date  . Arthritis    lower back, left shoulder  . Asthma   . Carpal tunnel syndrome on both sides   . Dental bridge present    top  . Dental crown present    multiple  . Family history of adverse reaction to anesthesia    Father and sister - PONV  . GERD (gastroesophageal reflux disease)   . Hypertension   . Leaky heart valve   . Pre-diabetes   . Sleep apnea     Past Surgical History:  Procedure Laterality Date  . CATARACT EXTRACTION W/PHACO Right 11/16/2016   Procedure: CATARACT EXTRACTION PHACO AND INTRAOCULAR LENS PLACEMENT (IOC)  right;  Surgeon: Lockie Mola, MD;  Location: [facility];  Service: Ophthalmology;  Laterality: Right;  pre diabetic latex sensitivity sleep apnea  . CATARACT EXTRACTION W/PHACO Left 01/18/2017   Procedure: CATARACT EXTRACTION PHACO AND INTRAOCULAR LENS PLACEMENT ([facility]) LEFT DIABETIC;  Surgeon: Lockie Mola, MD;  Location: [facility];  Service: Ophthalmology;  Laterality: Left;  . COLONOSCOPY    . IRIDOTOMY / IRIDECTOMY Bilateral 2005  . TOOTH EXTRACTION      Prior to Admission medications   Medication Sig Start Date End Date Taking? Authorizing Provider  albuterol (PROAIR HFA) 108 (90 BASE) MCG/ACT inhaler Inhale into the lungs. 06/24/14  Yes [provider]  amLODipine (NORVASC) 5 MG tablet Take 5 mg by mouth every morning. 01/22/15  Yes [provider]  gabapentin (NEURONTIN) 300 MG capsule Take 300 mg by mouth at bedtime. 02/04/15  Yes [provider]  Ibuprofen (ADVIL) 200 MG CAPS Take by mouth.   Yes [provider]  lansoprazole (PREVACID) 30 MG capsule daily. 12/08/14  Yes [provider]  metFORMIN (GLUCOPHAGE-XR) 500 MG 24 hr tablet  Take 500 mg by mouth daily. 02/04/15  Yes [provider]  montelukast (SINGULAIR) 10 MG tablet Take by mouth.   Yes [provider]  timolol (BETIMOL) 0.5 % ophthalmic solution Place 1 drop into both eyes 2 (two) times daily.   Yes [provider]  triamterene-hydrochlorothiazide (MAXZIDE-25) 37.5-25 MG tablet once daily. Take 1/2 - 1 tablets by mouth once a day   Yes [provider]  aspirin EC 81 MG tablet Take by mouth.    [provider]  bimatoprost (LUMIGAN) 0.01 % SOLN Apply to eye.    [provider]  Cetirizine HCl 10 MG CAPS Take by mouth.    [provider]  Cholecalciferol (VITAMIN D3) 50000 units TABS Take by mouth once a week.    [provider]  FeFum-FePoly-FA-B Cmp-C-Biot (INTEGRA PLUS) CAPS     [provider]  Fluticasone-Salmeterol (ADVAIR DISKUS) 250-50 MCG/DOSE AEPB Inhale into the lungs. 06/24/14   [provider]  gabapentin (NEURONTIN) 600 MG tablet Take by mouth.    [provider]  Ibuprofen-Diphenhydramine Cit (ADVIL PM PO) Take by mouth at bedtime.    [provider]  Icosapent Ethyl (VASCEPA) 1 g CAPS Take 2 g by mouth 2 (two) times daily.    [provider]  Polyethyl Glycol-Propyl Glycol (SYSTANE OP) Apply to eye as needed.    [provider]  simvastatin (ZOCOR) 20 MG tablet Take 20 mg by mouth daily. 02/03/15   [provider]    Allergies as of 01/22/2018 - Review Complete 01/18/2017  Allergen Reaction Noted  . Latex Other (See Comments) 02/16/2015  . Pseudoephedrine-guaifenesin Other (See Comments) 02/23/2015  . Other Rash 09/06/2016    Family History  Problem Relation Age of Onset  . Breast cancer Neg Hx     Social History   Socioeconomic History  . Marital status: Divorced    Spouse name: Not on file  . Number of children: Not on file  . Years of education: Not on file  . Highest education level: Not on file   Occupational History  . Not on file  Social Needs  . Financial resource strain: Not on file  . Food insecurity:    Worry: Not on file    Inability: Not on file  . Transportation needs:    Medical: Not on file    Non-medical: Not on file  Tobacco Use  . Smoking status: Former Smoker    Last attempt to quit: 1988    Years since quitting: 32.0  . Smokeless tobacco: Never Used  Substance and Sexual Activity  . Alcohol use: Yes    Alcohol/week: 0.0 standard drinks    Comment: 5x/yr  . Drug use: No  . Sexual activity: Not on file  Lifestyle  . Physical activity:    Days per week: Not on file    Minutes per session: Not on file  . Stress: Not on file  Relationships  . Social connections:    Talks on phone: Not on file    Gets together: Not on file    Attends religious service: Not on file    Active member of club or organization: Not on file    Attends meetings of clubs or organizations: Not on file    Relationship status: Not on file  . Intimate partner violence:    Fear of current or ex partner: Not on file    Emotionally abused: Not on file    Physically abused: Not on file    Forced sexual activity: Not on file  Other Topics Concern  . Not on file  Social History Narrative  . Not on file    Review of Systems: See HPI, otherwise negative ROS  Physical Exam: BP 124/90   Pulse 71   Temp (!) 97 F (36.1 C) (Tympanic)   Resp 18   Ht 5\' 3"  (1.6 m)   Wt 98.9 kg   SpO2 97%   BMI 38.62 kg/m  General:   Alert,  pleasant and cooperative in NAD Head:  Normocephalic and atraumatic. Neck:  Supple; no masses or thyromegaly. Lungs:  Clear throughout to auscultation.    Heart:  Regular rate and rhythm. Abdomen:  Soft, nontender and nondistended. Normal bowel sounds, without guarding, and without rebound.   Neurologic:  Alert and  oriented x4;  grossly normal neurologically.  Impression/Plan: Judith Arnold is here for an colonoscopy to be performed for [facility] colon  polyps last one 01/21/2013,  FH colon polyps.  Risks, benefits, limitations, and alternatives regarding  colonoscopy have been reviewed with the patient.  Questions have been answered.  All parties agreeable.   Lynnae Prude, MD  04/09/2018, 9:53 AM

## 2018-04-09 NOTE — Anesthesia Post-op Follow-up Note (Signed)
Anesthesia QCDR form completed.        

## 2018-04-11 ENCOUNTER — Encounter: Payer: Self-pay | Admitting: Unknown Physician Specialty

## 2019-03-25 ENCOUNTER — Other Ambulatory Visit: Payer: Self-pay | Admitting: Internal Medicine

## 2019-03-25 DIAGNOSIS — Z1231 Encounter for screening mammogram for malignant neoplasm of breast: Secondary | ICD-10-CM

## 2019-04-26 ENCOUNTER — Ambulatory Visit
Admission: RE | Admit: 2019-04-26 | Discharge: 2019-04-26 | Disposition: A | Discharge status: Home / Self Care | Payer: Medicare PPO | Source: Ambulatory Visit | Attending: Internal Medicine | Admitting: Internal Medicine

## 2019-04-26 DIAGNOSIS — Z1231 Encounter for screening mammogram for malignant neoplasm of breast: Secondary | ICD-10-CM | POA: Insufficient documentation

## 2020-03-26 ENCOUNTER — Other Ambulatory Visit: Payer: Self-pay | Admitting: Internal Medicine

## 2020-03-26 DIAGNOSIS — Z1231 Encounter for screening mammogram for malignant neoplasm of breast: Secondary | ICD-10-CM

## 2020-04-28 ENCOUNTER — Ambulatory Visit
Admission: RE | Admit: 2020-04-28 | Discharge: 2020-04-28 | Disposition: A | Discharge status: Home / Self Care | Payer: Medicare PPO | Source: Ambulatory Visit | Attending: Internal Medicine | Admitting: Internal Medicine

## 2020-04-28 ENCOUNTER — Other Ambulatory Visit: Payer: Self-pay

## 2020-04-28 DIAGNOSIS — Z1231 Encounter for screening mammogram for malignant neoplasm of breast: Secondary | ICD-10-CM | POA: Diagnosis not present

## 2020-11-24 DIAGNOSIS — Z23 Encounter for immunization: Secondary | ICD-10-CM | POA: Diagnosis not present

## 2020-11-24 DIAGNOSIS — I10 Essential (primary) hypertension: Secondary | ICD-10-CM | POA: Diagnosis not present

## 2020-11-24 DIAGNOSIS — E782 Mixed hyperlipidemia: Secondary | ICD-10-CM | POA: Diagnosis not present

## 2020-11-24 DIAGNOSIS — D519 Vitamin B12 deficiency anemia, unspecified: Secondary | ICD-10-CM | POA: Diagnosis not present

## 2020-11-24 DIAGNOSIS — Z0000 Encounter for general adult medical examination without abnormal findings: Secondary | ICD-10-CM | POA: Diagnosis not present

## 2020-11-24 DIAGNOSIS — E119 Type 2 diabetes mellitus without complications: Secondary | ICD-10-CM | POA: Diagnosis not present

## 2020-11-24 DIAGNOSIS — J4520 Mild intermittent asthma, uncomplicated: Secondary | ICD-10-CM | POA: Diagnosis not present

## 2021-02-19 DIAGNOSIS — I10 Essential (primary) hypertension: Secondary | ICD-10-CM | POA: Diagnosis not present

## 2021-02-19 DIAGNOSIS — E119 Type 2 diabetes mellitus without complications: Secondary | ICD-10-CM | POA: Diagnosis not present

## 2021-02-19 DIAGNOSIS — E782 Mixed hyperlipidemia: Secondary | ICD-10-CM | POA: Diagnosis not present

## 2021-02-19 DIAGNOSIS — D519 Vitamin B12 deficiency anemia, unspecified: Secondary | ICD-10-CM | POA: Diagnosis not present

## 2021-03-02 DIAGNOSIS — R7302 Impaired glucose tolerance (oral): Secondary | ICD-10-CM | POA: Diagnosis not present

## 2021-03-02 DIAGNOSIS — E782 Mixed hyperlipidemia: Secondary | ICD-10-CM | POA: Diagnosis not present

## 2021-03-02 DIAGNOSIS — I10 Essential (primary) hypertension: Secondary | ICD-10-CM | POA: Diagnosis not present

## 2021-03-22 DIAGNOSIS — I10 Essential (primary) hypertension: Secondary | ICD-10-CM | POA: Diagnosis not present

## 2021-03-22 DIAGNOSIS — E782 Mixed hyperlipidemia: Secondary | ICD-10-CM | POA: Diagnosis not present

## 2021-03-22 DIAGNOSIS — E119 Type 2 diabetes mellitus without complications: Secondary | ICD-10-CM | POA: Diagnosis not present

## 2021-04-05 DIAGNOSIS — E119 Type 2 diabetes mellitus without complications: Secondary | ICD-10-CM | POA: Diagnosis not present

## 2021-04-05 DIAGNOSIS — J4520 Mild intermittent asthma, uncomplicated: Secondary | ICD-10-CM | POA: Diagnosis not present

## 2021-04-05 DIAGNOSIS — E782 Mixed hyperlipidemia: Secondary | ICD-10-CM | POA: Diagnosis not present

## 2021-04-05 DIAGNOSIS — I10 Essential (primary) hypertension: Secondary | ICD-10-CM | POA: Diagnosis not present

## 2021-04-16 DIAGNOSIS — J340 Abscess, furuncle and carbuncle of nose: Secondary | ICD-10-CM | POA: Diagnosis not present

## 2021-04-16 DIAGNOSIS — J342 Deviated nasal septum: Secondary | ICD-10-CM | POA: Diagnosis not present

## 2021-04-26 ENCOUNTER — Other Ambulatory Visit: Payer: Self-pay | Admitting: Family

## 2021-04-26 DIAGNOSIS — E782 Mixed hyperlipidemia: Secondary | ICD-10-CM | POA: Diagnosis not present

## 2021-04-26 DIAGNOSIS — I10 Essential (primary) hypertension: Secondary | ICD-10-CM | POA: Diagnosis not present

## 2021-04-26 DIAGNOSIS — J4520 Mild intermittent asthma, uncomplicated: Secondary | ICD-10-CM | POA: Diagnosis not present

## 2021-04-26 DIAGNOSIS — Z1231 Encounter for screening mammogram for malignant neoplasm of breast: Secondary | ICD-10-CM

## 2021-04-26 DIAGNOSIS — E119 Type 2 diabetes mellitus without complications: Secondary | ICD-10-CM | POA: Diagnosis not present

## 2021-05-20 DIAGNOSIS — J340 Abscess, furuncle and carbuncle of nose: Secondary | ICD-10-CM | POA: Diagnosis not present

## 2021-05-20 DIAGNOSIS — J342 Deviated nasal septum: Secondary | ICD-10-CM | POA: Diagnosis not present

## 2021-05-20 DIAGNOSIS — J301 Allergic rhinitis due to pollen: Secondary | ICD-10-CM | POA: Diagnosis not present

## 2021-06-01 ENCOUNTER — Ambulatory Visit
Admission: RE | Admit: 2021-06-01 | Discharge: 2021-06-01 | Disposition: A | Discharge status: Home / Self Care | Payer: Medicare PPO | Source: Ambulatory Visit | Attending: Family | Admitting: Family

## 2021-06-01 ENCOUNTER — Other Ambulatory Visit: Payer: Self-pay

## 2021-06-01 DIAGNOSIS — Z1231 Encounter for screening mammogram for malignant neoplasm of breast: Secondary | ICD-10-CM | POA: Diagnosis not present

## 2021-06-17 DIAGNOSIS — D519 Vitamin B12 deficiency anemia, unspecified: Secondary | ICD-10-CM | POA: Diagnosis not present

## 2021-06-17 DIAGNOSIS — E119 Type 2 diabetes mellitus without complications: Secondary | ICD-10-CM | POA: Diagnosis not present

## 2021-06-17 DIAGNOSIS — I10 Essential (primary) hypertension: Secondary | ICD-10-CM | POA: Diagnosis not present

## 2021-06-17 DIAGNOSIS — E782 Mixed hyperlipidemia: Secondary | ICD-10-CM | POA: Diagnosis not present

## 2021-06-21 DIAGNOSIS — E782 Mixed hyperlipidemia: Secondary | ICD-10-CM | POA: Diagnosis not present

## 2021-06-21 DIAGNOSIS — J4531 Mild persistent asthma with (acute) exacerbation: Secondary | ICD-10-CM | POA: Diagnosis not present

## 2021-06-21 DIAGNOSIS — J301 Allergic rhinitis due to pollen: Secondary | ICD-10-CM | POA: Diagnosis not present

## 2021-06-21 DIAGNOSIS — E119 Type 2 diabetes mellitus without complications: Secondary | ICD-10-CM | POA: Diagnosis not present

## 2021-06-21 DIAGNOSIS — I10 Essential (primary) hypertension: Secondary | ICD-10-CM | POA: Diagnosis not present

## 2021-09-16 DIAGNOSIS — I10 Essential (primary) hypertension: Secondary | ICD-10-CM | POA: Diagnosis not present

## 2021-09-16 DIAGNOSIS — E782 Mixed hyperlipidemia: Secondary | ICD-10-CM | POA: Diagnosis not present

## 2021-09-16 DIAGNOSIS — E119 Type 2 diabetes mellitus without complications: Secondary | ICD-10-CM | POA: Diagnosis not present

## 2021-09-20 DIAGNOSIS — I10 Essential (primary) hypertension: Secondary | ICD-10-CM | POA: Diagnosis not present

## 2021-09-20 DIAGNOSIS — J4530 Mild persistent asthma, uncomplicated: Secondary | ICD-10-CM | POA: Diagnosis not present

## 2021-09-20 DIAGNOSIS — E782 Mixed hyperlipidemia: Secondary | ICD-10-CM | POA: Diagnosis not present

## 2021-09-20 DIAGNOSIS — E119 Type 2 diabetes mellitus without complications: Secondary | ICD-10-CM | POA: Diagnosis not present

## 2021-11-08 DIAGNOSIS — Z0100 Encounter for examination of eyes and vision without abnormal findings: Secondary | ICD-10-CM | POA: Diagnosis not present

## 2021-11-08 DIAGNOSIS — H18523 Epithelial (juvenile) corneal dystrophy, bilateral: Secondary | ICD-10-CM | POA: Diagnosis not present

## 2021-11-08 DIAGNOSIS — H401131 Primary open-angle glaucoma, bilateral, mild stage: Secondary | ICD-10-CM | POA: Diagnosis not present

## 2022-01-19 DIAGNOSIS — E119 Type 2 diabetes mellitus without complications: Secondary | ICD-10-CM | POA: Diagnosis not present

## 2022-01-19 DIAGNOSIS — E782 Mixed hyperlipidemia: Secondary | ICD-10-CM | POA: Diagnosis not present

## 2022-01-19 DIAGNOSIS — I10 Essential (primary) hypertension: Secondary | ICD-10-CM | POA: Diagnosis not present

## 2022-01-21 DIAGNOSIS — M25551 Pain in right hip: Secondary | ICD-10-CM | POA: Diagnosis not present

## 2022-01-21 DIAGNOSIS — Z23 Encounter for immunization: Secondary | ICD-10-CM | POA: Diagnosis not present

## 2022-01-21 DIAGNOSIS — J4530 Mild persistent asthma, uncomplicated: Secondary | ICD-10-CM | POA: Diagnosis not present

## 2022-01-21 DIAGNOSIS — M8589 Other specified disorders of bone density and structure, multiple sites: Secondary | ICD-10-CM | POA: Diagnosis not present

## 2022-01-21 DIAGNOSIS — E119 Type 2 diabetes mellitus without complications: Secondary | ICD-10-CM | POA: Diagnosis not present

## 2022-01-21 DIAGNOSIS — I10 Essential (primary) hypertension: Secondary | ICD-10-CM | POA: Diagnosis not present

## 2022-01-21 DIAGNOSIS — Z0000 Encounter for general adult medical examination without abnormal findings: Secondary | ICD-10-CM | POA: Diagnosis not present

## 2022-01-21 DIAGNOSIS — E782 Mixed hyperlipidemia: Secondary | ICD-10-CM | POA: Diagnosis not present

## 2022-01-24 DIAGNOSIS — E782 Mixed hyperlipidemia: Secondary | ICD-10-CM | POA: Diagnosis not present

## 2022-01-24 DIAGNOSIS — Z0000 Encounter for general adult medical examination without abnormal findings: Secondary | ICD-10-CM | POA: Diagnosis not present

## 2022-01-24 DIAGNOSIS — I10 Essential (primary) hypertension: Secondary | ICD-10-CM | POA: Diagnosis not present

## 2022-01-24 DIAGNOSIS — J4530 Mild persistent asthma, uncomplicated: Secondary | ICD-10-CM | POA: Diagnosis not present

## 2022-01-24 DIAGNOSIS — E119 Type 2 diabetes mellitus without complications: Secondary | ICD-10-CM | POA: Diagnosis not present

## 2022-01-24 DIAGNOSIS — M25551 Pain in right hip: Secondary | ICD-10-CM | POA: Diagnosis not present

## 2022-01-24 DIAGNOSIS — M8589 Other specified disorders of bone density and structure, multiple sites: Secondary | ICD-10-CM | POA: Diagnosis not present

## 2022-04-20 ENCOUNTER — Other Ambulatory Visit: Payer: Self-pay | Admitting: Internal Medicine

## 2022-04-20 DIAGNOSIS — M8589 Other specified disorders of bone density and structure, multiple sites: Secondary | ICD-10-CM

## 2022-05-10 DIAGNOSIS — H401121 Primary open-angle glaucoma, left eye, mild stage: Secondary | ICD-10-CM | POA: Diagnosis not present

## 2022-05-10 DIAGNOSIS — Z961 Presence of intraocular lens: Secondary | ICD-10-CM | POA: Diagnosis not present

## 2022-05-10 DIAGNOSIS — H401112 Primary open-angle glaucoma, right eye, moderate stage: Secondary | ICD-10-CM | POA: Diagnosis not present

## 2022-05-20 ENCOUNTER — Other Ambulatory Visit: Payer: Self-pay | Admitting: Internal Medicine

## 2022-05-20 ENCOUNTER — Other Ambulatory Visit: Payer: Medicare PPO

## 2022-05-20 DIAGNOSIS — D508 Other iron deficiency anemias: Secondary | ICD-10-CM

## 2022-05-20 DIAGNOSIS — E119 Type 2 diabetes mellitus without complications: Secondary | ICD-10-CM

## 2022-05-20 DIAGNOSIS — E782 Mixed hyperlipidemia: Secondary | ICD-10-CM

## 2022-05-20 DIAGNOSIS — I10 Essential (primary) hypertension: Secondary | ICD-10-CM | POA: Diagnosis not present

## 2022-05-21 LAB — CMP14+EGFR
ALT: 13 IU/L (ref 0–32)
AST: 20 IU/L (ref 0–40)
Albumin/Globulin Ratio: 1.8 (ref 1.2–2.2)
Albumin: 4.2 g/dL (ref 3.8–4.8)
Alkaline Phosphatase: 69 IU/L (ref 44–121)
BUN/Creatinine Ratio: 15 (ref 12–28)
BUN: 15 mg/dL (ref 8–27)
Bilirubin Total: 0.5 mg/dL (ref 0.0–1.2)
CO2: 24 mmol/L (ref 20–29)
Calcium: 9.5 mg/dL (ref 8.7–10.3)
Chloride: 105 mmol/L (ref 96–106)
Creatinine, Ser: 0.98 mg/dL (ref 0.57–1.00)
Globulin, Total: 2.3 g/dL (ref 1.5–4.5)
Glucose: 88 mg/dL (ref 70–99)
Potassium: 4.5 mmol/L (ref 3.5–5.2)
Sodium: 144 mmol/L (ref 134–144)
Total Protein: 6.5 g/dL (ref 6.0–8.5)
eGFR: 61 mL/min/{1.73_m2} (ref >59)

## 2022-05-21 LAB — CBC WITH DIFFERENTIAL
Basophils Absolute: 0 10*3/uL (ref 0.0–0.2)
Basos: 1 %
EOS (ABSOLUTE): 0.2 10*3/uL (ref 0.0–0.4)
Eos: 4 %
Hematocrit: 35 % (ref 34.0–46.6)
Hemoglobin: 11.6 g/dL (ref 11.1–15.9)
Immature Grans (Abs): 0 10*3/uL (ref 0.0–0.1)
Immature Granulocytes: 0 %
Lymphocytes Absolute: 1.8 10*3/uL (ref 0.7–3.1)
Lymphs: 32 %
MCH: 27.7 pg (ref 26.6–33.0)
MCHC: 33.1 g/dL (ref 31.5–35.7)
MCV: 84 fL (ref 79–97)
Monocytes Absolute: 0.3 10*3/uL (ref 0.1–0.9)
Monocytes: 6 %
Neutrophils Absolute: 3.2 10*3/uL (ref 1.4–7.0)
Neutrophils: 57 %
RBC: 4.19 x10E6/uL (ref 3.77–5.28)
RDW: 12.9 % (ref 11.7–15.4)
WBC: 5.6 10*3/uL (ref 3.4–10.8)

## 2022-05-21 LAB — LIPID PANEL W/O CHOL/HDL RATIO
Cholesterol, Total: 143 mg/dL (ref 100–199)
HDL: 70 mg/dL (ref >39)
LDL Chol Calc (NIH): 54 mg/dL (ref 0–99)
Triglycerides: 103 mg/dL (ref 0–149)
VLDL Cholesterol Cal: 19 mg/dL (ref 5–40)

## 2022-05-21 LAB — HEMOGLOBIN A1C
Est. average glucose Bld gHb Est-mCnc: 123 mg/dL
Hgb A1c MFr Bld: 5.9 % — ABNORMAL HIGH (ref 4.8–5.6)

## 2022-05-24 ENCOUNTER — Ambulatory Visit (INDEPENDENT_AMBULATORY_CARE_PROVIDER_SITE_OTHER): Payer: Medicare PPO

## 2022-05-24 ENCOUNTER — Encounter: Payer: Self-pay | Admitting: Internal Medicine

## 2022-05-24 ENCOUNTER — Ambulatory Visit: Payer: Medicare PPO | Admitting: Internal Medicine

## 2022-05-24 VITALS — BP 128/70 | HR 70 | Ht 63.0 in | Wt 212.0 lb

## 2022-05-24 DIAGNOSIS — D508 Other iron deficiency anemias: Secondary | ICD-10-CM | POA: Diagnosis not present

## 2022-05-24 DIAGNOSIS — K219 Gastro-esophageal reflux disease without esophagitis: Secondary | ICD-10-CM

## 2022-05-24 DIAGNOSIS — J309 Allergic rhinitis, unspecified: Secondary | ICD-10-CM

## 2022-05-24 DIAGNOSIS — M8589 Other specified disorders of bone density and structure, multiple sites: Secondary | ICD-10-CM

## 2022-05-24 DIAGNOSIS — D649 Anemia, unspecified: Secondary | ICD-10-CM | POA: Insufficient documentation

## 2022-05-24 DIAGNOSIS — G4733 Obstructive sleep apnea (adult) (pediatric): Secondary | ICD-10-CM

## 2022-05-24 DIAGNOSIS — H402234 Chronic angle-closure glaucoma, bilateral, indeterminate stage: Secondary | ICD-10-CM

## 2022-05-24 DIAGNOSIS — E119 Type 2 diabetes mellitus without complications: Secondary | ICD-10-CM | POA: Diagnosis not present

## 2022-05-24 DIAGNOSIS — J4540 Moderate persistent asthma, uncomplicated: Secondary | ICD-10-CM

## 2022-05-24 DIAGNOSIS — Z1231 Encounter for screening mammogram for malignant neoplasm of breast: Secondary | ICD-10-CM

## 2022-05-24 DIAGNOSIS — I10 Essential (primary) hypertension: Secondary | ICD-10-CM | POA: Diagnosis not present

## 2022-05-24 DIAGNOSIS — E782 Mixed hyperlipidemia: Secondary | ICD-10-CM | POA: Diagnosis not present

## 2022-05-24 LAB — POCT CBG (FASTING - GLUCOSE)-MANUAL ENTRY: Glucose Fasting, POC: 100 mg/dL — AB (ref 70–99)

## 2022-05-24 LAB — POCT UA - MICROALBUMIN
Creatinine, POC: 50 mg/dL
Microalbumin Ur, POC: 30 mg/L

## 2022-05-24 NOTE — Progress Notes (Signed)
Established Patient Office Visit  Subjective:  Patient ID: Judith Arnold, female    DOB: November 17, 1950  Age: 72 y.o. MRN: [id number]  Chief Complaint  Patient presents with   Follow-up    4 month follow up    Patient comes in for her follow-up today.  She recently had labs done and the results were discussed.  Essentially the labs are all stable.  She offers no new complaints.  Her bone density was done today results not available at this time.  She is also due for mammogram will send an order.     Past Medical History:  Diagnosis Date   Allergy    Arthritis    lower back, left shoulder   Asthma    Carpal tunnel syndrome on both sides    Dental bridge present    top   Dental crown present    multiple   Family history of adverse reaction to anesthesia    Father and sister - PONV   GERD (gastroesophageal reflux disease)    Glaucoma    Hypertension    Leaky heart valve    Pre-diabetes    Sleep apnea     Past Surgical History:  Procedure Laterality Date   CATARACT EXTRACTION W/PHACO Right 11/16/2016   Procedure: CATARACT EXTRACTION PHACO AND INTRAOCULAR LENS PLACEMENT ([place])  right;  Surgeon: Leandrew Koyanagi, MD;  Location: [place];  Service: Ophthalmology;  Laterality: Right;  pre diabetic latex sensitivity sleep apnea   CATARACT EXTRACTION W/PHACO Left 01/18/2017   Procedure: CATARACT EXTRACTION PHACO AND INTRAOCULAR LENS PLACEMENT ([address]) LEFT DIABETIC;  Surgeon: Leandrew Koyanagi, MD;  Location: [place];  Service: Ophthalmology;  Laterality: Left;   COLONOSCOPY     COLONOSCOPY WITH PROPOFOL N/A 04/09/2018   Procedure: COLONOSCOPY WITH PROPOFOL;  Surgeon: Manya Silvas, MD;  Location: [facility] END[facility];  Service: Endoscopy;  Laterality: N/A;   IRIDOTOMY / IRIDECTOMY Bilateral 2005   TOOTH EXTRACTION      Social History   Socioeconomic History   Marital status: Divorced    Spouse name: Not on file   Number of children: Not on file    Years of education: Not on file   Highest education level: Not on file  Occupational History   Not on file  Tobacco Use   Smoking status: Former    Types: Cigarettes    Quit date: 57    Years since quitting: 36.2   Smokeless tobacco: Never  Vaping Use   Vaping Use: Never used  Substance and Sexual Activity   Alcohol use: Yes    Alcohol/week: 0.0 standard drinks of alcohol    Comment: 5x/yr   Drug use: No   Sexual activity: Not on file  Other Topics Concern   Not on file  Social History Narrative   Not on file   Social Determinants of Health   Financial Resource Strain: Not on file  Food Insecurity: Not on file  Transportation Needs: Not on file  Physical Activity: Not on file  Stress: Not on file  Social Connections: Not on file  Intimate Partner Violence: Not on file    Family History  Problem Relation Age of Onset   Hypertension Mother    Diabetes Father    Breast cancer Neg Hx     Allergies  Allergen Reactions   Latex Other (See Comments)    Gets mouth rash from latex dental gloves. (Latex IgE - 08/25/16 - Negative)   Pseudoephedrine-Guaifenesin Other (See Comments)  Severe Headache   Other Rash    Walnuts cause mouth rash    Review of Systems  Constitutional: Negative.   HENT: Negative.    Eyes: Negative.   Respiratory: Negative.    Cardiovascular: Negative.   Gastrointestinal: Negative.   Genitourinary: Negative.   Musculoskeletal: Negative.   Skin: Negative.   Neurological: Negative.   Endo/Heme/Allergies: Negative.   Psychiatric/Behavioral: Negative.         Objective:   BP 128/70   Pulse 70   Ht '5\' 3"'$  (1.6 m)   Wt 212 lb (96.2 kg)   SpO2 96%   BMI 37.55 kg/m   Vitals:   05/24/22 1329  BP: 128/70  Pulse: 70  Height: '5\' 3"'$  (1.6 m)  Weight: 212 lb (96.2 kg)  SpO2: 96%  BMI (Calculated): 37.56    Physical Exam Vitals and nursing note reviewed.  Constitutional:      Appearance: Normal appearance. She is obese.  HENT:      Head: Normocephalic.  Cardiovascular:     Rate and Rhythm: Normal rate.  Pulmonary:     Effort: Pulmonary effort is normal.     Breath sounds: Normal breath sounds.  Abdominal:     General: Abdomen is flat. Bowel sounds are normal.     Palpations: Abdomen is soft.  Musculoskeletal:        General: Normal range of motion.     Cervical back: Normal range of motion and neck supple.  Skin:    General: Skin is warm.  Neurological:     General: No focal deficit present.     Mental Status: She is alert and oriented to person, place, and time.  Psychiatric:        Mood and Affect: Mood normal.        Behavior: Behavior normal.      Results for orders placed or performed in visit on 05/24/22  POCT UA - Microalbumin  Result Value Ref Range   Microalbumin Ur, POC 30 mg/L   Creatinine, POC 50 mg/dL   Albumin/Creatinine Ratio, Urine, POC 30-300   POCT CBG (Fasting - Glucose)  Result Value Ref Range   Glucose Fasting, POC 100 (A) 70 - 99 mg/dL    Recent Results (from the past 2160 hour(s))  CBC With Differential     Status: None   Collection Time: 05/20/22  3:20 PM  Result Value Ref Range   WBC 5.6 3.4 - 10.8 x10E3/uL   RBC 4.19 3.77 - 5.28 x10E6/uL   Hemoglobin 11.6 11.1 - 15.9 g/dL   Hematocrit 35.0 34.0 - 46.6 %   MCV 84 79 - 97 fL   MCH 27.7 26.6 - 33.0 pg   MCHC 33.1 31.5 - 35.7 g/dL   RDW 12.9 11.7 - 15.4 %   Neutrophils 57 Not Estab. %   Lymphs 32 Not Estab. %   Monocytes 6 Not Estab. %   Eos 4 Not Estab. %   Basos 1 Not Estab. %   Neutrophils Absolute 3.2 1.4 - 7.0 x10E3/uL   Lymphocytes Absolute 1.8 0.7 - 3.1 x10E3/uL   Monocytes Absolute 0.3 0.1 - 0.9 x10E3/uL   EOS (ABSOLUTE) 0.2 0.0 - 0.4 x10E3/uL   Basophils Absolute 0.0 0.0 - 0.2 x10E3/uL   Immature Granulocytes 0 Not Estab. %   Immature Grans (Abs) 0.0 0.0 - 0.1 x10E3/uL  Hemoglobin A1c     Status: Abnormal   Collection Time: 05/20/22  3:20 PM  Result Value Ref Range   Hgb  A1c MFr Bld 5.9 (H) 4.8 - 5.6  %    Comment:          Prediabetes: 5.7 - 6.4          Diabetes: >6.4          Glycemic control for adults with diabetes: <7.0    Est. average glucose Bld gHb Est-mCnc 123 mg/dL  CMP14+EGFR     Status: None   Collection Time: 05/20/22  3:20 PM  Result Value Ref Range   Glucose 88 70 - 99 mg/dL   BUN 15 8 - 27 mg/dL   Creatinine, Ser 0.98 0.57 - 1.00 mg/dL   eGFR 61 >59 mL/min/1.73   BUN/Creatinine Ratio 15 12 - 28   Sodium 144 134 - 144 mmol/L   Potassium 4.5 3.5 - 5.2 mmol/L   Chloride 105 96 - 106 mmol/L   CO2 24 20 - 29 mmol/L   Calcium 9.5 8.7 - 10.3 mg/dL   Total Protein 6.5 6.0 - 8.5 g/dL   Albumin 4.2 3.8 - 4.8 g/dL   Globulin, Total 2.3 1.5 - 4.5 g/dL   Albumin/Globulin Ratio 1.8 1.2 - 2.2   Bilirubin Total 0.5 0.0 - 1.2 mg/dL   Alkaline Phosphatase 69 44 - 121 IU/L   AST 20 0 - 40 IU/L   ALT 13 0 - 32 IU/L  Lipid Panel w/o Chol/HDL Ratio     Status: None   Collection Time: 05/20/22  3:20 PM  Result Value Ref Range   Cholesterol, Total 143 100 - 199 mg/dL   Triglycerides 103 0 - 149 mg/dL   HDL 70 >39 mg/dL   VLDL Cholesterol Cal 19 5 - 40 mg/dL   LDL Chol Calc (NIH) 54 0 - 99 mg/dL  POCT CBG (Fasting - Glucose)     Status: Abnormal   Collection Time: 05/24/22  1:40 PM  Result Value Ref Range   Glucose Fasting, POC 100 (A) 70 - 99 mg/dL  POCT UA - Microalbumin     Status: Abnormal   Collection Time: 05/24/22  1:45 PM  Result Value Ref Range   Microalbumin Ur, POC 30 mg/L   Creatinine, POC 50 mg/dL   Albumin/Creatinine Ratio, Urine, POC 30-300       Assessment & Plan:  Patient advised to continue all her medications as such.  She will get her mammogram scheduled also. Problem List Items Addressed This Visit     OSA on CPAP   Type 2 diabetes mellitus without complication, without long-term current use of insulin ([place]) - Primary   Relevant Orders   POCT UA - Microalbumin (Completed)   POCT CBG (Fasting - Glucose) (Completed)   Mixed hyperlipidemia    Essential hypertension, benign   Absolute anemia   Gastroesophageal reflux disease without esophagitis   Allergic rhinitis   Asthma, allergic, moderate persistent, uncomplicated   Bilateral chronic angle-closure glaucoma, indeterminate stage   Other Visit Diagnoses     Encounter for screening mammogram for malignant neoplasm of breast       Relevant Orders   MM 3D SCREENING MAMMOGRAM BILATERAL BREAST       Return in about 4 months (around 09/23/2022).   Total time spent: 30 minutes  Perrin Maltese, MD  05/24/2022

## 2022-06-27 ENCOUNTER — Other Ambulatory Visit: Payer: Self-pay | Admitting: Internal Medicine

## 2022-06-27 DIAGNOSIS — J4540 Moderate persistent asthma, uncomplicated: Secondary | ICD-10-CM

## 2022-06-27 DIAGNOSIS — I10 Essential (primary) hypertension: Secondary | ICD-10-CM

## 2022-09-11 ENCOUNTER — Other Ambulatory Visit: Payer: Self-pay | Admitting: Internal Medicine

## 2022-09-11 DIAGNOSIS — E119 Type 2 diabetes mellitus without complications: Secondary | ICD-10-CM

## 2022-09-21 ENCOUNTER — Other Ambulatory Visit: Payer: Medicare PPO

## 2022-09-21 ENCOUNTER — Other Ambulatory Visit: Payer: Self-pay | Admitting: Internal Medicine

## 2022-09-21 ENCOUNTER — Other Ambulatory Visit: Payer: Self-pay

## 2022-09-21 DIAGNOSIS — I10 Essential (primary) hypertension: Secondary | ICD-10-CM | POA: Diagnosis not present

## 2022-09-21 DIAGNOSIS — E119 Type 2 diabetes mellitus without complications: Secondary | ICD-10-CM | POA: Diagnosis not present

## 2022-09-21 DIAGNOSIS — E782 Mixed hyperlipidemia: Secondary | ICD-10-CM

## 2022-09-22 LAB — LIPID PANEL
Chol/HDL Ratio: 1.9 ratio (ref 0.0–4.4)
Cholesterol, Total: 137 mg/dL (ref 100–199)
HDL: 73 mg/dL (ref >39)
LDL Chol Calc (NIH): 48 mg/dL (ref 0–99)
Triglycerides: 87 mg/dL (ref 0–149)
VLDL Cholesterol Cal: 16 mg/dL (ref 5–40)

## 2022-09-22 LAB — CBC WITH DIFF/PLATELET
Basophils Absolute: 0.1 10*3/uL (ref 0.0–0.2)
Basos: 1 %
EOS (ABSOLUTE): 0.3 10*3/uL (ref 0.0–0.4)
Eos: 5 %
Hematocrit: 32.7 % — ABNORMAL LOW (ref 34.0–46.6)
Hemoglobin: 10.6 g/dL — ABNORMAL LOW (ref 11.1–15.9)
Immature Grans (Abs): 0 10*3/uL (ref 0.0–0.1)
Immature Granulocytes: 1 %
Lymphocytes Absolute: 1.5 10*3/uL (ref 0.7–3.1)
Lymphs: 27 %
MCH: 28.2 pg (ref 26.6–33.0)
MCHC: 32.4 g/dL (ref 31.5–35.7)
MCV: 87 fL (ref 79–97)
Monocytes Absolute: 0.4 10*3/uL (ref 0.1–0.9)
Monocytes: 7 %
Neutrophils Absolute: 3.3 10*3/uL (ref 1.4–7.0)
Neutrophils: 59 %
Platelets: 231 10*3/uL (ref 150–450)
RBC: 3.76 x10E6/uL — ABNORMAL LOW (ref 3.77–5.28)
RDW: 13.4 % (ref 11.7–15.4)
WBC: 5.6 10*3/uL (ref 3.4–10.8)

## 2022-09-22 LAB — CMP14+EGFR
ALT: 12 IU/L (ref 0–32)
AST: 17 IU/L (ref 0–40)
Albumin: 4.2 g/dL (ref 3.8–4.8)
Alkaline Phosphatase: 74 IU/L (ref 44–121)
BUN/Creatinine Ratio: 20 (ref 12–28)
BUN: 23 mg/dL (ref 8–27)
Bilirubin Total: 0.4 mg/dL (ref 0.0–1.2)
CO2: 22 mmol/L (ref 20–29)
Calcium: 9.5 mg/dL (ref 8.7–10.3)
Chloride: 104 mmol/L (ref 96–106)
Creatinine, Ser: 1.17 mg/dL — ABNORMAL HIGH (ref 0.57–1.00)
Globulin, Total: 2.2 g/dL (ref 1.5–4.5)
Glucose: 90 mg/dL (ref 70–99)
Potassium: 5 mmol/L (ref 3.5–5.2)
Sodium: 142 mmol/L (ref 134–144)
Total Protein: 6.4 g/dL (ref 6.0–8.5)
eGFR: 49 mL/min/{1.73_m2} — ABNORMAL LOW (ref >59)

## 2022-09-22 LAB — HEMOGLOBIN A1C
Est. average glucose Bld gHb Est-mCnc: 117 mg/dL
Hgb A1c MFr Bld: 5.7 % — ABNORMAL HIGH (ref 4.8–5.6)

## 2022-09-23 ENCOUNTER — Ambulatory Visit: Payer: Medicare PPO | Admitting: Internal Medicine

## 2022-09-23 ENCOUNTER — Encounter: Payer: Self-pay | Admitting: Internal Medicine

## 2022-09-23 DIAGNOSIS — I872 Venous insufficiency (chronic) (peripheral): Secondary | ICD-10-CM | POA: Diagnosis not present

## 2022-09-23 DIAGNOSIS — M25512 Pain in left shoulder: Secondary | ICD-10-CM

## 2022-09-23 DIAGNOSIS — L03116 Cellulitis of left lower limb: Secondary | ICD-10-CM | POA: Diagnosis not present

## 2022-09-23 DIAGNOSIS — M7502 Adhesive capsulitis of left shoulder: Secondary | ICD-10-CM | POA: Diagnosis not present

## 2022-09-23 DIAGNOSIS — E782 Mixed hyperlipidemia: Secondary | ICD-10-CM | POA: Diagnosis not present

## 2022-09-23 DIAGNOSIS — D508 Other iron deficiency anemias: Secondary | ICD-10-CM | POA: Diagnosis not present

## 2022-09-23 DIAGNOSIS — I10 Essential (primary) hypertension: Secondary | ICD-10-CM

## 2022-09-23 DIAGNOSIS — E119 Type 2 diabetes mellitus without complications: Secondary | ICD-10-CM

## 2022-09-23 MED ORDER — CEPHALEXIN 500 MG PO CAPS: 500.0000 mg | ORAL_CAPSULE | Freq: Two times a day (BID) | ORAL | 0 refills | Status: AC

## 2022-09-23 NOTE — Progress Notes (Signed)
Established Patient Office Visit  Subjective:  Patient ID: Judith Arnold, female    DOB: 03/02/1951  Age: 72 y.o. MRN: [id number]  Chief Complaint  Patient presents with   Follow-up    4 month follow up    Patient comes in for her follow-up and has a few complaints.  She recently noted increased swelling in both her lower legs and feet along with some redness and irritation bilaterally.  Patient has history of lymphedema and now she is developing bilateral stasis dermatitis.  She has not been scratching it but is recently got a pedicure.  There is an element of mild cellulitis.  Will start p.o. Keflex.  Patient advised to keep her legs elevated and start using compression stockings when possible.  She also has a prescription for a diuretic but does not take it regularly.  Patient advised to take it at least Monday Wednesday and Fridays until her next visit. Patient also reports of worsening left shoulder pain.  It has been a problem for him several years now.  And was diagnosed with adhesive capsulitis and rotator cuff tendinitis.  But it is getting worse now with further reduction in her range of motion.  Please set up a new orthopedic consult for her and may benefit from physical therapy as well. Patient missed her mammogram will be rescheduled. Labs discussed.  Her hemoglobin had dropped a little, patient given Hemoccult stool cards to take home.  Needs to drink more water.    No other concerns at this time.   Past Medical History:  Diagnosis Date   Allergy    Arthritis    lower back, left shoulder   Asthma    Carpal tunnel syndrome on both sides    Dental bridge present    top   Dental crown present    multiple   Family history of adverse reaction to anesthesia    Father and sister - PONV   GERD (gastroesophageal reflux disease)    Glaucoma    Hypertension    Leaky heart valve    Pre-diabetes    Sleep apnea     Past Surgical History:  Procedure Laterality Date    CATARACT EXTRACTION W/PHACO Right 11/16/2016   Procedure: CATARACT EXTRACTION PHACO AND INTRAOCULAR LENS PLACEMENT (IOC)  right;  Surgeon: Lockie Mola, MD;  Location: [facility];  Service: Ophthalmology;  Laterality: Right;  pre diabetic latex sensitivity sleep apnea   CATARACT EXTRACTION W/PHACO Left 01/18/2017   Procedure: CATARACT EXTRACTION PHACO AND INTRAOCULAR LENS PLACEMENT (IOC) LEFT DIABETIC;  Surgeon: Lockie Mola, MD;  Location: [facility];  Service: Ophthalmology;  Laterality: Left;   COLONOSCOPY     COLONOSCOPY WITH PROPOFOL N/A 04/09/2018   Procedure: COLONOSCOPY WITH PROPOFOL;  Surgeon: Scot Jun, MD;  Location: [facility] END[facility];  Service: Endoscopy;  Laterality: N/A;   IRIDOTOMY / IRIDECTOMY Bilateral 2005   TOOTH EXTRACTION      Social History   Socioeconomic History   Marital status: Divorced    Spouse name: Not on file   Number of children: Not on file   Years of education: Not on file   Highest education level: Not on file  Occupational History   Not on file  Tobacco Use   Smoking status: Former    Current packs/day: 0.00    Types: Cigarettes    Quit date: 41    Years since quitting: 36.5   Smokeless tobacco: Never  Vaping Use   Vaping status: Never  Used  Substance and Sexual Activity   Alcohol use: Yes    Alcohol/week: 0.0 standard drinks of alcohol    Comment: 5x/yr   Drug use: No   Sexual activity: Not on file  Other Topics Concern   Not on file  Social History Narrative   Not on file   Social Determinants of Health   Financial Resource Strain: Not on file  Food Insecurity: Not on file  Transportation Needs: Not on file  Physical Activity: Not on file  Stress: Not on file  Social Connections: Not on file  Intimate Partner Violence: Not on file    Family History  Problem Relation Age of Onset   Hypertension Mother    Diabetes Father    Breast cancer Neg Hx     Allergies  Allergen Reactions    Latex Other (See Comments)    Gets mouth rash from latex dental gloves. (Latex IgE - 08/25/16 - Negative)   Pseudoephedrine-Guaifenesin Other (See Comments)    Severe Headache   Other Rash    Walnuts cause mouth rash    Review of Systems  Constitutional: Negative.  Negative for chills, fever and malaise/fatigue.  HENT: Negative.    Eyes: Negative.   Respiratory: Negative.  Negative for cough and shortness of breath.   Cardiovascular: Negative.  Negative for chest pain, palpitations and leg swelling.  Gastrointestinal: Negative.  Negative for abdominal pain, constipation, diarrhea, heartburn, nausea and vomiting.  Genitourinary: Negative.  Negative for dysuria and flank pain.  Musculoskeletal:  Positive for joint pain (Left shoulder). Negative for myalgias.  Skin: Negative.   Neurological: Negative.  Negative for dizziness and headaches.  Endo/Heme/Allergies: Negative.   Psychiatric/Behavioral: Negative.  Negative for depression and suicidal ideas. The patient is not nervous/anxious.        Objective:   BP (!) 152/78   Pulse 71   Ht 5\' 3"  (1.6 m)   Wt 212 lb (96.2 kg)   SpO2 98%   BMI 37.55 kg/m   Vitals:   09/23/22 1323  BP: (!) 152/78  Pulse: 71  Height: 5\' 3"  (1.6 m)  Weight: 212 lb (96.2 kg)  SpO2: 98%  BMI (Calculated): 37.56    Physical Exam Vitals and nursing note reviewed.  Constitutional:      Appearance: Normal appearance.  HENT:     Head: Normocephalic and atraumatic.     Nose: Nose normal.     Mouth/Throat:     Mouth: Mucous membranes are moist.     Pharynx: Oropharynx is clear.  Eyes:     Conjunctiva/sclera: Conjunctivae normal.     Pupils: Pupils are equal, round, and reactive to light.  Cardiovascular:     Rate and Rhythm: Normal rate and regular rhythm.     Pulses: Normal pulses.     Heart sounds: Normal heart sounds. No murmur heard. Pulmonary:     Effort: Pulmonary effort is normal.     Breath sounds: Normal breath sounds. No wheezing.   Abdominal:     General: Bowel sounds are normal.     Palpations: Abdomen is soft.     Tenderness: There is no abdominal tenderness. There is no right CVA tenderness or left CVA tenderness.  Musculoskeletal:        General: Normal range of motion.     Cervical back: Normal range of motion.     Right lower leg: Edema present.     Left lower leg: Edema present.  Skin:    General: Skin is  warm and dry.  Neurological:     General: No focal deficit present.     Mental Status: She is alert and oriented to person, place, and time.  Psychiatric:        Mood and Affect: Mood normal.        Behavior: Behavior normal.      No results found for any visits on 09/23/22.  Recent Results (from the past 2160 hour(s))  CBC With Diff/Platelet     Status: Abnormal   Collection Time: 09/21/22 10:33 AM  Result Value Ref Range   WBC 5.6 3.4 - 10.8 x10E3/uL   RBC 3.76 (L) 3.77 - 5.28 x10E6/uL   Hemoglobin 10.6 (L) 11.1 - 15.9 g/dL   Hematocrit 96.0 (L) 45.4 - 46.6 %   MCV 87 79 - 97 fL   MCH 28.2 26.6 - 33.0 pg   MCHC 32.4 31.5 - 35.7 g/dL   RDW 09.8 11.9 - 14.7 %   Platelets 231 150 - 450 x10E3/uL   Neutrophils 59 Not Estab. %   Lymphs 27 Not Estab. %   Monocytes 7 Not Estab. %   Eos 5 Not Estab. %   Basos 1 Not Estab. %   Neutrophils Absolute 3.3 1.4 - 7.0 x10E3/uL   Lymphocytes Absolute 1.5 0.7 - 3.1 x10E3/uL   Monocytes Absolute 0.4 0.1 - 0.9 x10E3/uL   EOS (ABSOLUTE) 0.3 0.0 - 0.4 x10E3/uL   Basophils Absolute 0.1 0.0 - 0.2 x10E3/uL   Immature Granulocytes 1 Not Estab. %   Immature Grans (Abs) 0.0 0.0 - 0.1 x10E3/uL  Hemoglobin A1c     Status: Abnormal   Collection Time: 09/21/22 10:33 AM  Result Value Ref Range   Hgb A1c MFr Bld 5.7 (H) 4.8 - 5.6 %    Comment:          Prediabetes: 5.7 - 6.4          Diabetes: >6.4          Glycemic control for adults with diabetes: <7.0    Est. average glucose Bld gHb Est-mCnc 117 mg/dL  Lipid panel     Status: None   Collection Time:  09/21/22 10:33 AM  Result Value Ref Range   Cholesterol, Total 137 100 - 199 mg/dL   Triglycerides 87 0 - 149 mg/dL   HDL 73 >82 mg/dL   VLDL Cholesterol Cal 16 5 - 40 mg/dL   LDL Chol Calc (NIH) 48 0 - 99 mg/dL   Chol/HDL Ratio 1.9 0.0 - 4.4 ratio    Comment:                                   T. Chol/HDL Ratio                                             Men  Women                               1/2 Avg.Risk  3.4    3.3                                   Avg.Risk  5.0    4.4  2X Avg.Risk  9.6    7.1                                3X Avg.Risk 23.4   11.0   CMP14+EGFR     Status: Abnormal   Collection Time: 09/21/22 10:33 AM  Result Value Ref Range   Glucose 90 70 - 99 mg/dL   BUN 23 8 - 27 mg/dL   Creatinine, Ser 1.61 (H) 0.57 - 1.00 mg/dL   eGFR 49 (L) >09 UE/AVW/0.98   BUN/Creatinine Ratio 20 12 - 28   Sodium 142 134 - 144 mmol/L   Potassium 5.0 3.5 - 5.2 mmol/L   Chloride 104 96 - 106 mmol/L   CO2 22 20 - 29 mmol/L   Calcium 9.5 8.7 - 10.3 mg/dL   Total Protein 6.4 6.0 - 8.5 g/dL   Albumin 4.2 3.8 - 4.8 g/dL   Globulin, Total 2.2 1.5 - 4.5 g/dL   Bilirubin Total 0.4 0.0 - 1.2 mg/dL   Alkaline Phosphatase 74 44 - 121 IU/L   AST 17 0 - 40 IU/L   ALT 12 0 - 32 IU/L      Assessment & Plan:  Patient will start p.o. Keflex for mild cellulitis of both her legs.  Keep legs elevated and try to wear compression stockings.  Also to use her diuretic only on Monday Wednesdays and Fridays. Orthopedic consult sent for left shoulder pain. Problem List Items Addressed This Visit   None Visit Diagnoses     Left anterior shoulder pain    -  Primary   Relevant Orders   Ambulatory referral to Orthopedic Surgery   Cellulitis of left lower extremity       Relevant Medications   cephALEXin (KEFLEX) 500 MG capsule   Stasis dermatitis of both legs       Adhesive capsulitis of left shoulder           Return in about 3 weeks (around 10/14/2022).   Total time  spent: 30 minutes  Margaretann Loveless, MD  09/23/2022   This document may have been prepared by [employer] Voice Recognition software and as such may include unintentional dictation errors.

## 2022-10-10 ENCOUNTER — Other Ambulatory Visit: Payer: Self-pay | Admitting: Internal Medicine

## 2022-10-21 ENCOUNTER — Ambulatory Visit: Payer: Medicare PPO | Admitting: Internal Medicine

## 2022-10-21 ENCOUNTER — Encounter: Payer: Self-pay | Admitting: Internal Medicine

## 2022-10-21 VITALS — BP 142/80 | HR 72 | Ht 60.0 in | Wt 214.0 lb

## 2022-10-21 DIAGNOSIS — E119 Type 2 diabetes mellitus without complications: Secondary | ICD-10-CM | POA: Diagnosis not present

## 2022-10-21 DIAGNOSIS — I890 Lymphedema, not elsewhere classified: Secondary | ICD-10-CM | POA: Diagnosis not present

## 2022-10-21 DIAGNOSIS — M7989 Other specified soft tissue disorders: Secondary | ICD-10-CM

## 2022-10-21 DIAGNOSIS — E782 Mixed hyperlipidemia: Secondary | ICD-10-CM

## 2022-10-21 DIAGNOSIS — I10 Essential (primary) hypertension: Secondary | ICD-10-CM

## 2022-10-21 DIAGNOSIS — M19012 Primary osteoarthritis, left shoulder: Secondary | ICD-10-CM | POA: Diagnosis not present

## 2022-10-21 DIAGNOSIS — I872 Venous insufficiency (chronic) (peripheral): Secondary | ICD-10-CM | POA: Diagnosis not present

## 2022-10-21 LAB — POCT CBG (FASTING - GLUCOSE)-MANUAL ENTRY: Glucose Fasting, POC: 103 mg/dL — AB (ref 70–99)

## 2022-10-21 MED ORDER — FUROSEMIDE 20 MG PO TABS: 20.0000 mg | ORAL_TABLET | Freq: Every day | ORAL | 2 refills | Status: DC

## 2022-10-21 NOTE — Progress Notes (Signed)
Established Patient Office Visit  Subjective:  Patient ID: Judith Arnold, female    DOB: 03/12/1951  Age: 72 y.o. MRN: [id number]  Chief Complaint  Patient presents with   Follow-up    3 week follow up    Patient comes in for her follow-up today.  At her last visit she was having  increased swelling of both her lower extremities with stasis dermatitis and mild cellulitis.  She was given a prescription of p.o. Keflex and was advised to continue taking her other medications including Lasix 20 mg/day on Monday Wednesdays and Fridays.  Today patient states that after taking Keflex for a week she started having increased drowsiness and muscular aches and pains which she thinks was a side effect from it so she stopped it.  She also reports that the Lasix she had at home was expired and it did not help much. Today her redness has improved from before but she still has bilateral lymphedema with mild residual dermatitis.  She has trouble putting on compression stockings.  Will get a consult with the vein and vascular and consider lymphedema pump. Patient still has pain in her left shoulder with reduced range of motion.  She has not seen the orthopedic surgeon yet. Denies chest pain, no shortness of breath, no headaches or dizziness.    No other concerns at this time.   Past Medical History:  Diagnosis Date   Allergy    Arthritis    lower back, left shoulder   Asthma    Carpal tunnel syndrome on both sides    Dental bridge present    top   Dental crown present    multiple   Family history of adverse reaction to anesthesia    Father and sister - PONV   GERD (gastroesophageal reflux disease)    Glaucoma    Hypertension    Leaky heart valve    Pre-diabetes    Sleep apnea     Past Surgical History:  Procedure Laterality Date   CATARACT EXTRACTION W/PHACO Right 11/16/2016   Procedure: CATARACT EXTRACTION PHACO AND INTRAOCULAR LENS PLACEMENT (IOC)  right;  Surgeon: Lockie Mola, MD;  Location: [facility];  Service: Ophthalmology;  Laterality: Right;  pre diabetic latex sensitivity sleep apnea   CATARACT EXTRACTION W/PHACO Left 01/18/2017   Procedure: CATARACT EXTRACTION PHACO AND INTRAOCULAR LENS PLACEMENT (IOC) LEFT DIABETIC;  Surgeon: Lockie Mola, MD;  Location: [facility];  Service: Ophthalmology;  Laterality: Left;   COLONOSCOPY     COLONOSCOPY WITH PROPOFOL N/A 04/09/2018   Procedure: COLONOSCOPY WITH PROPOFOL;  Surgeon: Scot Jun, MD;  Location: [facility];  Service: Endoscopy;  Laterality: N/A;   IRIDOTOMY / IRIDECTOMY Bilateral 2005   TOOTH EXTRACTION      Social History   Socioeconomic History   Marital status: Divorced    Spouse name: Not on file   Number of children: Not on file   Years of education: Not on file   Highest education level: Not on file  Occupational History   Not on file  Tobacco Use   Smoking status: Former    Current packs/day: 0.00    Types: Cigarettes    Quit date: 43    Years since quitting: 36.6   Smokeless tobacco: Never  Vaping Use   Vaping status: Never Used  Substance and Sexual Activity   Alcohol use: Yes    Alcohol/week: 0.0 standard drinks of alcohol    Comment: 5x/yr   Drug use:  No   Sexual activity: Not on file  Other Topics Concern   Not on file  Social History Narrative   Not on file   Social Determinants of Health   Financial Resource Strain: Not on file  Food Insecurity: Not on file  Transportation Needs: Not on file  Physical Activity: Not on file  Stress: Not on file  Social Connections: Not on file  Intimate Partner Violence: Not on file    Family History  Problem Relation Age of Onset   Hypertension Mother    Diabetes Father    Breast cancer Neg Hx     Allergies  Allergen Reactions   Latex Other (See Comments)    Gets mouth rash from latex dental gloves. (Latex IgE - 08/25/16 - Negative)   Pseudoephedrine-Guaifenesin Other (See  Comments)    Severe Headache   Other Rash    Walnuts cause mouth rash    Review of Systems  Constitutional: Negative.  Negative for chills, diaphoresis, fever, malaise/fatigue and weight loss.  HENT: Negative.  Negative for congestion.   Eyes: Negative.   Respiratory: Negative.  Negative for cough, shortness of breath and stridor.   Cardiovascular: Negative.  Negative for chest pain, palpitations and leg swelling.  Gastrointestinal: Negative.  Negative for abdominal pain, constipation, diarrhea, heartburn, nausea and vomiting.  Genitourinary: Negative.  Negative for dysuria and flank pain.  Musculoskeletal:  Positive for joint pain and myalgias.  Skin: Negative.   Neurological: Negative.  Negative for dizziness and headaches.  Endo/Heme/Allergies: Negative.   Psychiatric/Behavioral: Negative.  Negative for depression and suicidal ideas. The patient is not nervous/anxious.        Objective:   BP (!) 142/80   Pulse 72   Ht 5' (1.524 m)   Wt 214 lb (97.1 kg)   SpO2 97%   BMI 41.79 kg/m   Vitals:   10/21/22 1350  BP: (!) 142/80  Pulse: 72  Height: 5' (1.524 m)  Weight: 214 lb (97.1 kg)  SpO2: 97%  BMI (Calculated): 41.79    Physical Exam Vitals and nursing note reviewed.  Constitutional:      Appearance: Normal appearance.  HENT:     Head: Normocephalic and atraumatic.     Nose: Nose normal.     Mouth/Throat:     Mouth: Mucous membranes are moist.     Pharynx: Oropharynx is clear.  Eyes:     Conjunctiva/sclera: Conjunctivae normal.     Pupils: Pupils are equal, round, and reactive to light.  Cardiovascular:     Rate and Rhythm: Normal rate and regular rhythm.     Pulses: Normal pulses.     Heart sounds: Normal heart sounds. No murmur heard. Pulmonary:     Effort: Pulmonary effort is normal.     Breath sounds: Normal breath sounds. No wheezing.  Abdominal:     General: Bowel sounds are normal.     Palpations: Abdomen is soft.     Tenderness: There is no  abdominal tenderness. There is no right CVA tenderness or left CVA tenderness.  Musculoskeletal:        General: Normal range of motion.     Cervical back: Normal range of motion.     Right lower leg: Edema present.     Left lower leg: Edema present.  Skin:    General: Skin is warm and dry.  Neurological:     General: No focal deficit present.     Mental Status: She is alert and oriented to person,  place, and time.  Psychiatric:        Mood and Affect: Mood normal.        Behavior: Behavior normal.      Results for orders placed or performed in visit on 10/21/22  POCT CBG (Fasting - Glucose)  Result Value Ref Range   Glucose Fasting, POC 103 (A) 70 - 99 mg/dL    Recent Results (from the past 2160 hour(s))  CBC With Diff/Platelet     Status: Abnormal   Collection Time: 09/21/22 10:33 AM  Result Value Ref Range   WBC 5.6 3.4 - 10.8 x10E3/uL   RBC 3.76 (L) 3.77 - 5.28 x10E6/uL   Hemoglobin 10.6 (L) 11.1 - 15.9 g/dL   Hematocrit 40.9 (L) 81.1 - 46.6 %   MCV 87 79 - 97 fL   MCH 28.2 26.6 - 33.0 pg   MCHC 32.4 31.5 - 35.7 g/dL   RDW 91.4 78.2 - 95.6 %   Platelets 231 150 - 450 x10E3/uL   Neutrophils 59 Not Estab. %   Lymphs 27 Not Estab. %   Monocytes 7 Not Estab. %   Eos 5 Not Estab. %   Basos 1 Not Estab. %   Neutrophils Absolute 3.3 1.4 - 7.0 x10E3/uL   Lymphocytes Absolute 1.5 0.7 - 3.1 x10E3/uL   Monocytes Absolute 0.4 0.1 - 0.9 x10E3/uL   EOS (ABSOLUTE) 0.3 0.0 - 0.4 x10E3/uL   Basophils Absolute 0.1 0.0 - 0.2 x10E3/uL   Immature Granulocytes 1 Not Estab. %   Immature Grans (Abs) 0.0 0.0 - 0.1 x10E3/uL  Hemoglobin A1c     Status: Abnormal   Collection Time: 09/21/22 10:33 AM  Result Value Ref Range   Hgb A1c MFr Bld 5.7 (H) 4.8 - 5.6 %    Comment:          Prediabetes: 5.7 - 6.4          Diabetes: >6.4          Glycemic control for adults with diabetes: <7.0    Est. average glucose Bld gHb Est-mCnc 117 mg/dL  Lipid panel     Status: None   Collection Time:  09/21/22 10:33 AM  Result Value Ref Range   Cholesterol, Total 137 100 - 199 mg/dL   Triglycerides 87 0 - 149 mg/dL   HDL 73 >21 mg/dL   VLDL Cholesterol Cal 16 5 - 40 mg/dL   LDL Chol Calc (NIH) 48 0 - 99 mg/dL   Chol/HDL Ratio 1.9 0.0 - 4.4 ratio    Comment:                                   T. Chol/HDL Ratio                                             Men  Women                               1/2 Avg.Risk  3.4    3.3                                   Avg.Risk  5.0    4.4  2X Avg.Risk  9.6    7.1                                3X Avg.Risk 23.4   11.0   CMP14+EGFR     Status: Abnormal   Collection Time: 09/21/22 10:33 AM  Result Value Ref Range   Glucose 90 70 - 99 mg/dL   BUN 23 8 - 27 mg/dL   Creatinine, Ser 0.96 (H) 0.57 - 1.00 mg/dL   eGFR 49 (L) >04 VW/UJW/1.19   BUN/Creatinine Ratio 20 12 - 28   Sodium 142 134 - 144 mmol/L   Potassium 5.0 3.5 - 5.2 mmol/L   Chloride 104 96 - 106 mmol/L   CO2 22 20 - 29 mmol/L   Calcium 9.5 8.7 - 10.3 mg/dL   Total Protein 6.4 6.0 - 8.5 g/dL   Albumin 4.2 3.8 - 4.8 g/dL   Globulin, Total 2.2 1.5 - 4.5 g/dL   Bilirubin Total 0.4 0.0 - 1.2 mg/dL   Alkaline Phosphatase 74 44 - 121 IU/L   AST 17 0 - 40 IU/L   ALT 12 0 - 32 IU/L  POCT CBG (Fasting - Glucose)     Status: Abnormal   Collection Time: 10/21/22  1:59 PM  Result Value Ref Range   Glucose Fasting, POC 103 (A) 70 - 99 mg/dL      Assessment & Plan:  Patient advised to continue taking her current medications.  She will be referred to vein and vascular. She also has to see the orthopedic for her left shoulder pain. Problem List Items Addressed This Visit     Type 2 diabetes mellitus without complication, without long-term current use of insulin (HCC) - Primary   Relevant Orders   POCT CBG (Fasting - Glucose) (Completed)   Mixed hyperlipidemia   Relevant Medications   furosemide (LASIX) 20 MG tablet   Essential hypertension, benign   Relevant  Medications   furosemide (LASIX) 20 MG tablet   Stasis dermatitis of both legs   Relevant Medications   furosemide (LASIX) 20 MG tablet   Leg swelling   Relevant Medications   furosemide (LASIX) 20 MG tablet   Other Relevant Orders   Ambulatory referral to Vascular Surgery   Lymphedema of lower extremity    Return in about 3 months (around 01/21/2023).   Total time spent: 30 minutes  Margaretann Loveless, MD  10/21/2022   This document may have been prepared by [facility] Voice Recognition software and as such may include unintentional dictation errors.

## 2022-10-26 ENCOUNTER — Telehealth: Payer: Self-pay | Admitting: Internal Medicine

## 2022-10-26 MED ORDER — TRIAMCINOLONE 0.1 % CREAM:EUCERIN CREAM 1:1
1.0000 | TOPICAL_CREAM | Freq: Two times a day (BID) | CUTANEOUS | 2 refills | Status: AC
Start: 2022-10-26 — End: ?

## 2022-10-26 NOTE — Telephone Encounter (Signed)
Patient called in with complaints of dermatitis on her lower legs/shin area and left thigh. States she was recently on cephalexin for this but cannot tolerate it. Should we send something in for the dermatitis?   [facility] [employer] [facility] [employer]

## 2022-10-28 DIAGNOSIS — M25512 Pain in left shoulder: Secondary | ICD-10-CM | POA: Diagnosis not present

## 2022-10-28 DIAGNOSIS — M25612 Stiffness of left shoulder, not elsewhere classified: Secondary | ICD-10-CM | POA: Diagnosis not present

## 2022-11-01 ENCOUNTER — Encounter: Payer: Self-pay | Admitting: Internal Medicine

## 2022-11-01 ENCOUNTER — Ambulatory Visit: Payer: Medicare PPO | Admitting: Internal Medicine

## 2022-11-01 VITALS — BP 140/60 | HR 72 | Ht 60.5 in | Wt 210.2 lb

## 2022-11-01 DIAGNOSIS — L304 Erythema intertrigo: Secondary | ICD-10-CM | POA: Diagnosis not present

## 2022-11-01 DIAGNOSIS — I872 Venous insufficiency (chronic) (peripheral): Secondary | ICD-10-CM

## 2022-11-01 DIAGNOSIS — I10 Essential (primary) hypertension: Secondary | ICD-10-CM

## 2022-11-01 DIAGNOSIS — D508 Other iron deficiency anemias: Secondary | ICD-10-CM | POA: Diagnosis not present

## 2022-11-01 DIAGNOSIS — I890 Lymphedema, not elsewhere classified: Secondary | ICD-10-CM | POA: Diagnosis not present

## 2022-11-01 MED ORDER — VALSARTAN 80 MG PO TABS
160.0000 mg | ORAL_TABLET | Freq: Every day | ORAL | 3 refills | Status: DC
Start: 2022-11-01 — End: 2023-08-24

## 2022-11-01 MED ORDER — INTEGRA PLUS PO CAPS: 1.0000 | ORAL_CAPSULE | Freq: Every day | ORAL | 6 refills | Status: AC

## 2022-11-01 MED ORDER — CLOTRIMAZOLE-BETAMETHASONE 1-0.05 % EX CREA
1.0000 | TOPICAL_CREAM | Freq: Two times a day (BID) | CUTANEOUS | 0 refills | Status: AC
Start: 2022-11-01 — End: ?

## 2022-11-01 NOTE — Progress Notes (Signed)
Established Patient Office Visit  Subjective:  Patient ID: Judith Arnold, female    DOB: 03/29/1950  Age: 72 y.o. MRN: [id number]  Chief Complaint  Patient presents with   Acute Visit    Allergic reaction to medication    Patient comes in with history of bilateral lower leg swelling and stasis dermatitis.  Today reports that her swelling is actually getting better more on the left side as compared to the right.  She has history of ankle injury of the right side however she has bilateral lymphedema which is improving slowly.  She is using triamcinolone cream and Aquaphor on her lower legs. Also noted some circular red scaly, very itchy patches on her groin and chest area.  Seems to be likely a fungal infection, will send in Lotrisone cream for that. She is getting physical therapy for her Left shoulder pain and is already noticing an improvement.  Wants to continue that for a while. Her blood pressure has been high recently.  Will increase her Valsartan to 80 mg to 160 and see if tolerated well.  Patient is also taking Lasix 20 mg Monday, Wednesdays and Fridays. Still waiting to be seen at the vein and vascular clinic.    No other concerns at this time.   Past Medical History:  Diagnosis Date   Allergy    Arthritis    lower back, left shoulder   Asthma    Carpal tunnel syndrome on both sides    Dental bridge present    top   Dental crown present    multiple   Family history of adverse reaction to anesthesia    Father and sister - PONV   GERD (gastroesophageal reflux disease)    Glaucoma    Hypertension    Leaky heart valve    Pre-diabetes    Sleep apnea     Past Surgical History:  Procedure Laterality Date   CATARACT EXTRACTION W/PHACO Right 11/16/2016   Procedure: CATARACT EXTRACTION PHACO AND INTRAOCULAR LENS PLACEMENT (IOC)  right;  Surgeon: Lockie Mola, MD;  Location: [facility];  Service: Ophthalmology;  Laterality: Right;  pre diabetic latex  sensitivity sleep apnea   CATARACT EXTRACTION W/PHACO Left 01/18/2017   Procedure: CATARACT EXTRACTION PHACO AND INTRAOCULAR LENS PLACEMENT (IOC) LEFT DIABETIC;  Surgeon: Lockie Mola, MD;  Location: [facility];  Service: Ophthalmology;  Laterality: Left;   COLONOSCOPY     COLONOSCOPY WITH PROPOFOL N/A 04/09/2018   Procedure: COLONOSCOPY WITH PROPOFOL;  Surgeon: Scot Jun, MD;  Location: [facility] END[facility]COPY;  Service: Endoscopy;  Laterality: N/A;   IRIDOTOMY / IRIDECTOMY Bilateral 2005   TOOTH EXTRACTION      Social History   Socioeconomic History   Marital status: Divorced    Spouse name: Not on file   Number of children: Not on file   Years of education: Not on file   Highest education level: Not on file  Occupational History   Not on file  Tobacco Use   Smoking status: Former    Current packs/day: 0.00    Types: Cigarettes    Quit date: 60    Years since quitting: 36.6   Smokeless tobacco: Never  Vaping Use   Vaping status: Never Used  Substance and Sexual Activity   Alcohol use: Yes    Alcohol/week: 0.0 standard drinks of alcohol    Comment: 5x/yr   Drug use: No   Sexual activity: Not on file  Other Topics Concern   Not on  file  Social History Narrative   Not on file   Social Determinants of Health   Financial Resource Strain: Not on file  Food Insecurity: Not on file  Transportation Needs: Not on file  Physical Activity: Not on file  Stress: Not on file  Social Connections: Not on file  Intimate Partner Violence: Not on file    Family History  Problem Relation Age of Onset   Hypertension Mother    Diabetes Father    Breast cancer Neg Hx     Allergies  Allergen Reactions   Latex Other (See Comments)    Gets mouth rash from latex dental gloves. (Latex IgE - 08/25/16 - Negative)   Pseudoephedrine-Guaifenesin Other (See Comments)    Severe Headache   Other Rash    Walnuts cause mouth rash    Review of Systems  Constitutional:  Negative.  Negative for chills, fever, malaise/fatigue and weight loss.  HENT: Negative.  Negative for congestion, sinus pain and sore throat.   Eyes: Negative.   Respiratory: Negative.  Negative for cough, shortness of breath and stridor.   Cardiovascular: Negative.  Negative for chest pain, palpitations and leg swelling.  Gastrointestinal: Negative.  Negative for abdominal pain, constipation, diarrhea, heartburn, nausea and vomiting.  Genitourinary: Negative.  Negative for dysuria and flank pain.  Musculoskeletal:  Positive for joint pain (Left shoulder). Negative for myalgias.  Skin:  Positive for itching and rash.  Neurological: Negative.  Negative for dizziness and headaches.  Endo/Heme/Allergies: Negative.   Psychiatric/Behavioral: Negative.  Negative for depression and suicidal ideas. The patient is not nervous/anxious.        Objective:   BP (!) 140/60   Pulse 72   Ht 5' 0.5" (1.537 m)   Wt 210 lb 3.2 oz (95.3 kg)   SpO2 95%   BMI 40.38 kg/m   Vitals:   11/01/22 1043 11/01/22 1132  BP: (!) 170/80 (!) 140/60  Pulse: 72   Height: 5' 0.5" (1.537 m)   Weight: 210 lb 3.2 oz (95.3 kg)   SpO2: 95%   BMI (Calculated): 40.36     Physical Exam Vitals and nursing note reviewed.  Constitutional:      Appearance: Normal appearance.  HENT:     Head: Normocephalic and atraumatic.     Nose: Nose normal.     Mouth/Throat:     Mouth: Mucous membranes are moist.     Pharynx: Oropharynx is clear.  Eyes:     Conjunctiva/sclera: Conjunctivae normal.     Pupils: Pupils are equal, round, and reactive to light.  Cardiovascular:     Rate and Rhythm: Normal rate and regular rhythm.     Pulses: Normal pulses.     Heart sounds: Normal heart sounds. No murmur heard. Pulmonary:     Effort: Pulmonary effort is normal.     Breath sounds: Normal breath sounds. No wheezing.  Abdominal:     General: Bowel sounds are normal.     Palpations: Abdomen is soft.     Tenderness: There is no  abdominal tenderness. There is no right CVA tenderness or left CVA tenderness.  Musculoskeletal:        General: Normal range of motion.     Cervical back: Normal range of motion.     Right lower leg: No edema.     Left lower leg: No edema.  Skin:    General: Skin is warm and dry.     Findings: Rash present.  Neurological:     General:  No focal deficit present.     Mental Status: She is alert and oriented to person, place, and time.  Psychiatric:        Mood and Affect: Mood normal.        Behavior: Behavior normal.      No results found for any visits on 11/01/22.  Recent Results (from the past 2160 hour(s))  CBC With Diff/Platelet     Status: Abnormal   Collection Time: 09/21/22 10:33 AM  Result Value Ref Range   WBC 5.6 3.4 - 10.8 x10E3/uL   RBC 3.76 (L) 3.77 - 5.28 x10E6/uL   Hemoglobin 10.6 (L) 11.1 - 15.9 g/dL   Hematocrit 09.8 (L) 11.9 - 46.6 %   MCV 87 79 - 97 fL   MCH 28.2 26.6 - 33.0 pg   MCHC 32.4 31.5 - 35.7 g/dL   RDW 14.7 82.9 - 56.2 %   Platelets 231 150 - 450 x10E3/uL   Neutrophils 59 Not Estab. %   Lymphs 27 Not Estab. %   Monocytes 7 Not Estab. %   Eos 5 Not Estab. %   Basos 1 Not Estab. %   Neutrophils Absolute 3.3 1.4 - 7.0 x10E3/uL   Lymphocytes Absolute 1.5 0.7 - 3.1 x10E3/uL   Monocytes Absolute 0.4 0.1 - 0.9 x10E3/uL   EOS (ABSOLUTE) 0.3 0.0 - 0.4 x10E3/uL   Basophils Absolute 0.1 0.0 - 0.2 x10E3/uL   Immature Granulocytes 1 Not Estab. %   Immature Grans (Abs) 0.0 0.0 - 0.1 x10E3/uL  Hemoglobin A1c     Status: Abnormal   Collection Time: 09/21/22 10:33 AM  Result Value Ref Range   Hgb A1c MFr Bld 5.7 (H) 4.8 - 5.6 %    Comment:          Prediabetes: 5.7 - 6.4          Diabetes: >6.4          Glycemic control for adults with diabetes: <7.0    Est. average glucose Bld gHb Est-mCnc 117 mg/dL  Lipid panel     Status: None   Collection Time: 09/21/22 10:33 AM  Result Value Ref Range   Cholesterol, Total 137 100 - 199 mg/dL   Triglycerides  87 0 - 149 mg/dL   HDL 73 >13 mg/dL   VLDL Cholesterol Cal 16 5 - 40 mg/dL   LDL Chol Calc (NIH) 48 0 - 99 mg/dL   Chol/HDL Ratio 1.9 0.0 - 4.4 ratio    Comment:                                   T. Chol/HDL Ratio                                             Men  Women                               1/2 Avg.Risk  3.4    3.3                                   Avg.Risk  5.0    4.4  2X Avg.Risk  9.6    7.1                                3X Avg.Risk 23.4   11.0   CMP14+EGFR     Status: Abnormal   Collection Time: 09/21/22 10:33 AM  Result Value Ref Range   Glucose 90 70 - 99 mg/dL   BUN 23 8 - 27 mg/dL   Creatinine, Ser 1.61 (H) 0.57 - 1.00 mg/dL   eGFR 49 (L) >09 UE/AVW/0.98   BUN/Creatinine Ratio 20 12 - 28   Sodium 142 134 - 144 mmol/L   Potassium 5.0 3.5 - 5.2 mmol/L   Chloride 104 96 - 106 mmol/L   CO2 22 20 - 29 mmol/L   Calcium 9.5 8.7 - 10.3 mg/dL   Total Protein 6.4 6.0 - 8.5 g/dL   Albumin 4.2 3.8 - 4.8 g/dL   Globulin, Total 2.2 1.5 - 4.5 g/dL   Bilirubin Total 0.4 0.0 - 1.2 mg/dL   Alkaline Phosphatase 74 44 - 121 IU/L   AST 17 0 - 40 IU/L   ALT 12 0 - 32 IU/L  POCT CBG (Fasting - Glucose)     Status: Abnormal   Collection Time: 10/21/22  1:59 PM  Result Value Ref Range   Glucose Fasting, POC 103 (A) 70 - 99 mg/dL      Assessment & Plan:  Trial of Lotrisone cream.  Monitor blood pressure at home.  Increase the dose of valsartan to 160 mg/day. Problem List Items Addressed This Visit     Essential hypertension, benign - Primary   Relevant Medications   valsartan (DIOVAN) 80 MG tablet   Absolute anemia   Relevant Medications   FeFum-FePoly-FA-B Cmp-C-Biot (INTEGRA PLUS) CAPS   Stasis dermatitis of both legs   Relevant Medications   valsartan (DIOVAN) 80 MG tablet   Lymphedema of lower extremity   Intertrigo   Relevant Medications   clotrimazole-betamethasone (LOTRISONE) cream    Return in about 2 weeks (around 11/15/2022).    Total time spent: 30 minutes  Margaretann Loveless, MD  11/01/2022   This document may have been prepared by [facility] Voice Recognition software and as such may include unintentional dictation errors.

## 2022-11-03 DIAGNOSIS — M25512 Pain in left shoulder: Secondary | ICD-10-CM | POA: Diagnosis not present

## 2022-11-03 DIAGNOSIS — M25612 Stiffness of left shoulder, not elsewhere classified: Secondary | ICD-10-CM | POA: Diagnosis not present

## 2022-11-05 ENCOUNTER — Other Ambulatory Visit: Payer: Self-pay | Admitting: Family

## 2022-11-05 DIAGNOSIS — E782 Mixed hyperlipidemia: Secondary | ICD-10-CM

## 2022-11-08 DIAGNOSIS — Z0100 Encounter for examination of eyes and vision without abnormal findings: Secondary | ICD-10-CM | POA: Diagnosis not present

## 2022-11-08 DIAGNOSIS — E119 Type 2 diabetes mellitus without complications: Secondary | ICD-10-CM | POA: Diagnosis not present

## 2022-11-08 DIAGNOSIS — H401121 Primary open-angle glaucoma, left eye, mild stage: Secondary | ICD-10-CM | POA: Diagnosis not present

## 2022-11-08 DIAGNOSIS — H401112 Primary open-angle glaucoma, right eye, moderate stage: Secondary | ICD-10-CM | POA: Diagnosis not present

## 2022-11-08 DIAGNOSIS — H26492 Other secondary cataract, left eye: Secondary | ICD-10-CM | POA: Diagnosis not present

## 2022-11-15 ENCOUNTER — Ambulatory Visit: Payer: Medicare PPO | Admitting: Internal Medicine

## 2022-11-17 DIAGNOSIS — M25512 Pain in left shoulder: Secondary | ICD-10-CM | POA: Diagnosis not present

## 2022-11-17 DIAGNOSIS — M25612 Stiffness of left shoulder, not elsewhere classified: Secondary | ICD-10-CM | POA: Diagnosis not present

## 2022-11-18 ENCOUNTER — Encounter: Payer: Self-pay | Admitting: Internal Medicine

## 2022-11-18 ENCOUNTER — Ambulatory Visit: Payer: Medicare PPO | Admitting: Internal Medicine

## 2022-11-18 VITALS — BP 170/80 | HR 70 | Ht 61.0 in | Wt 206.8 lb

## 2022-11-18 DIAGNOSIS — I872 Venous insufficiency (chronic) (peripheral): Secondary | ICD-10-CM | POA: Diagnosis not present

## 2022-11-18 DIAGNOSIS — I890 Lymphedema, not elsewhere classified: Secondary | ICD-10-CM | POA: Diagnosis not present

## 2022-11-18 DIAGNOSIS — L304 Erythema intertrigo: Secondary | ICD-10-CM | POA: Diagnosis not present

## 2022-11-18 DIAGNOSIS — I10 Essential (primary) hypertension: Secondary | ICD-10-CM

## 2022-11-18 DIAGNOSIS — E119 Type 2 diabetes mellitus without complications: Secondary | ICD-10-CM

## 2022-11-18 DIAGNOSIS — E782 Mixed hyperlipidemia: Secondary | ICD-10-CM

## 2022-11-19 ENCOUNTER — Encounter: Payer: Self-pay | Admitting: Internal Medicine

## 2022-11-19 NOTE — Progress Notes (Signed)
Established Patient Office Visit  Subjective:  Patient ID: Judith Arnold, female    DOB: [birth year]  Age: 72 y.o. MRN: [id number]  Chief Complaint  Patient presents with   Follow-up    2 week follow up    Patient comes in for follow up. Her BP is high but insists it is always much lower at home. She has checked with different BP monitors. Did not increase her dose of Valsartan to 160 , still on 80 mg. But taking Lasix 20 mg ,MW,F. Her leg swelling and stasis dermatitis has improved significantly. Lost some weight as well. Left shoulder discomfort and ROM is also better with PT.     No other concerns at this time.   Past Medical History:  Diagnosis Date   Allergy    Arthritis    lower back, left shoulder   Asthma    Carpal tunnel syndrome on both sides    Dental bridge present    top   Dental crown present    multiple   Family history of adverse reaction to anesthesia    Father and sister - PONV   GERD (gastroesophageal reflux disease)    Glaucoma    Hypertension    Leaky heart valve    Pre-diabetes    Sleep apnea     Past Surgical History:  Procedure Laterality Date   CATARACT EXTRACTION W/PHACO Right 11/16/2016   Procedure: CATARACT EXTRACTION PHACO AND INTRAOCULAR LENS PLACEMENT (IOC)  right;  Surgeon: Lockie Mola, MD;  Location: [facility];  Service: Ophthalmology;  Laterality: Right;  pre diabetic latex sensitivity sleep apnea   CATARACT EXTRACTION W/PHACO Left 01/18/2017   Procedure: CATARACT EXTRACTION PHACO AND INTRAOCULAR LENS PLACEMENT (IOC) LEFT DIABETIC;  Surgeon: Lockie Mola, MD;  Location: [facility];  Service: Ophthalmology;  Laterality: Left;   COLONOSCOPY     COLONOSCOPY WITH PROPOFOL N/A 04/09/2018   Procedure: COLONOSCOPY WITH PROPOFOL;  Surgeon: Scot Jun, MD;  Location: [facility] END[facility];  Service: Endoscopy;  Laterality: N/A;   IRIDOTOMY / IRIDECTOMY Bilateral 2005   TOOTH EXTRACTION      Social  History   Socioeconomic History   Marital status: Divorced    Spouse name: Not on file   Number of children: Not on file   Years of education: Not on file   Highest education level: Not on file  Occupational History   Not on file  Tobacco Use   Smoking status: Former    Current packs/day: 0.00    Types: Cigarettes    Quit date: 91    Years since quitting: 36.7   Smokeless tobacco: Never  Vaping Use   Vaping status: Never Used  Substance and Sexual Activity   Alcohol use: Yes    Alcohol/week: 0.0 standard drinks of alcohol    Comment: 5x/yr   Drug use: No   Sexual activity: Not on file  Other Topics Concern   Not on file  Social History Narrative   Not on file   Social Determinants of Health   Financial Resource Strain: Not on file  Food Insecurity: Not on file  Transportation Needs: Not on file  Physical Activity: Not on file  Stress: Not on file  Social Connections: Not on file  Intimate Partner Violence: Not on file    Family History  Problem Relation Age of Onset   Hypertension Mother    Diabetes Father    Breast cancer Neg Hx     Allergies  Allergen  Reactions   Latex Other (See Comments)    Gets mouth rash from latex dental gloves. (Latex IgE - 08/25/16 - Negative)   Pseudoephedrine-Guaifenesin Other (See Comments)    Severe Headache   Other Rash    Walnuts cause mouth rash    Review of Systems  Constitutional: Negative.   HENT: Negative.    Eyes: Negative.   Respiratory: Negative.  Negative for cough and shortness of breath.   Cardiovascular: Negative.  Negative for chest pain, palpitations and leg swelling.  Gastrointestinal: Negative.  Negative for abdominal pain, constipation, diarrhea, heartburn, nausea and vomiting.  Genitourinary: Negative.  Negative for dysuria and flank pain.  Musculoskeletal: Negative.  Negative for joint pain and myalgias.  Skin: Negative.   Neurological: Negative.  Negative for dizziness and headaches.   Endo/Heme/Allergies: Negative.   Psychiatric/Behavioral: Negative.  Negative for depression and suicidal ideas. The patient is not nervous/anxious.        Objective:   BP (!) 170/80   Pulse 70   Ht 5\' 1"  (1.549 m)   Wt 206 lb 12.8 oz (93.8 kg)   SpO2 96%   BMI 39.07 kg/m   Vitals:   11/18/22 1348  BP: (!) 170/80  Pulse: 70  Height: 5\' 1"  (1.549 m)  Weight: 206 lb 12.8 oz (93.8 kg)  SpO2: 96%  BMI (Calculated): 39.09    Physical Exam Vitals and nursing note reviewed.  Constitutional:      Appearance: Normal appearance.  HENT:     Head: Normocephalic and atraumatic.     Nose: Nose normal.     Mouth/Throat:     Mouth: Mucous membranes are moist.     Pharynx: Oropharynx is clear.  Eyes:     Conjunctiva/sclera: Conjunctivae normal.     Pupils: Pupils are equal, round, and reactive to light.  Cardiovascular:     Rate and Rhythm: Normal rate and regular rhythm.     Pulses: Normal pulses.     Heart sounds: Normal heart sounds. No murmur heard. Pulmonary:     Effort: Pulmonary effort is normal.     Breath sounds: Normal breath sounds. No wheezing.  Abdominal:     General: Bowel sounds are normal.     Palpations: Abdomen is soft.     Tenderness: There is no abdominal tenderness. There is no right CVA tenderness or left CVA tenderness.  Musculoskeletal:        General: Normal range of motion.     Cervical back: Normal range of motion.     Right lower leg: Edema present.     Left lower leg: Edema present.  Skin:    General: Skin is warm and dry.     Findings: Erythema present. No rash.  Neurological:     General: No focal deficit present.     Mental Status: She is alert and oriented to person, place, and time.  Psychiatric:        Mood and Affect: Mood normal.        Behavior: Behavior normal.      No results found for any visits on 11/18/22.  Recent Results (from the past 2160 hour(s))  CBC With Diff/Platelet     Status: Abnormal   Collection Time:  09/21/22 10:33 AM  Result Value Ref Range   WBC 5.6 3.4 - 10.8 x10E3/uL   RBC 3.76 (L) 3.77 - 5.28 x10E6/uL   Hemoglobin 10.6 (L) 11.1 - 15.9 g/dL   Hematocrit 40.9 (L) 81.1 - 46.6 %  MCV 87 79 - 97 fL   MCH 28.2 26.6 - 33.0 pg   MCHC 32.4 31.5 - 35.7 g/dL   RDW 16.1 09.6 - 04.5 %   Platelets 231 150 - 450 x10E3/uL   Neutrophils 59 Not Estab. %   Lymphs 27 Not Estab. %   Monocytes 7 Not Estab. %   Eos 5 Not Estab. %   Basos 1 Not Estab. %   Neutrophils Absolute 3.3 1.4 - 7.0 x10E3/uL   Lymphocytes Absolute 1.5 0.7 - 3.1 x10E3/uL   Monocytes Absolute 0.4 0.1 - 0.9 x10E3/uL   EOS (ABSOLUTE) 0.3 0.0 - 0.4 x10E3/uL   Basophils Absolute 0.1 0.0 - 0.2 x10E3/uL   Immature Granulocytes 1 Not Estab. %   Immature Grans (Abs) 0.0 0.0 - 0.1 x10E3/uL  Hemoglobin A1c     Status: Abnormal   Collection Time: 09/21/22 10:33 AM  Result Value Ref Range   Hgb A1c MFr Bld 5.7 (H) 4.8 - 5.6 %    Comment:          Prediabetes: 5.7 - 6.4          Diabetes: >6.4          Glycemic control for adults with diabetes: <7.0    Est. average glucose Bld gHb Est-mCnc 117 mg/dL  Lipid panel     Status: None   Collection Time: 09/21/22 10:33 AM  Result Value Ref Range   Cholesterol, Total 137 100 - 199 mg/dL   Triglycerides 87 0 - 149 mg/dL   HDL 73 >40 mg/dL   VLDL Cholesterol Cal 16 5 - 40 mg/dL   LDL Chol Calc (NIH) 48 0 - 99 mg/dL   Chol/HDL Ratio 1.9 0.0 - 4.4 ratio    Comment:                                   T. Chol/HDL Ratio                                             Men  Women                               1/2 Avg.Risk  3.4    3.3                                   Avg.Risk  5.0    4.4                                2X Avg.Risk  9.6    7.1                                3X Avg.Risk 23.4   11.0   CMP14+EGFR     Status: Abnormal   Collection Time: 09/21/22 10:33 AM  Result Value Ref Range   Glucose 90 70 - 99 mg/dL   BUN 23 8 - 27 mg/dL   Creatinine, Ser 9.81 (H) 0.57 - 1.00 mg/dL   eGFR  49 (L) >19 JY/NWG/9.56   BUN/Creatinine Ratio  20 12 - 28   Sodium 142 134 - 144 mmol/L   Potassium 5.0 3.5 - 5.2 mmol/L   Chloride 104 96 - 106 mmol/L   CO2 22 20 - 29 mmol/L   Calcium 9.5 8.7 - 10.3 mg/dL   Total Protein 6.4 6.0 - 8.5 g/dL   Albumin 4.2 3.8 - 4.8 g/dL   Globulin, Total 2.2 1.5 - 4.5 g/dL   Bilirubin Total 0.4 0.0 - 1.2 mg/dL   Alkaline Phosphatase 74 44 - 121 IU/L   AST 17 0 - 40 IU/L   ALT 12 0 - 32 IU/L  POCT CBG (Fasting - Glucose)     Status: Abnormal   Collection Time: 10/21/22  1:59 PM  Result Value Ref Range   Glucose Fasting, POC 103 (A) 70 - 99 mg/dL      Assessment & Plan:  Patient advised to continue meds, and monitor BP at home. Problem List Items Addressed This Visit     Type 2 diabetes mellitus without complication, without long-term current use of insulin (HCC)   Mixed hyperlipidemia   Essential hypertension, benign - Primary   Stasis dermatitis of both legs   Lymphedema of lower extremity   Intertrigo    Return in about 6 weeks (around 12/30/2022).   Total time spent: 25 minutes  Margaretann Loveless, MD  11/18/2022   This document may have been prepared by [facility] Voice Recognition software and as such may include unintentional dictation errors.

## 2022-11-22 DIAGNOSIS — M25512 Pain in left shoulder: Secondary | ICD-10-CM | POA: Diagnosis not present

## 2022-11-22 DIAGNOSIS — M25612 Stiffness of left shoulder, not elsewhere classified: Secondary | ICD-10-CM | POA: Diagnosis not present

## 2022-11-29 DIAGNOSIS — M25612 Stiffness of left shoulder, not elsewhere classified: Secondary | ICD-10-CM | POA: Diagnosis not present

## 2022-11-29 DIAGNOSIS — M25512 Pain in left shoulder: Secondary | ICD-10-CM | POA: Diagnosis not present

## 2022-12-07 ENCOUNTER — Encounter: Payer: Self-pay | Admitting: Internal Medicine

## 2022-12-08 DIAGNOSIS — H26492 Other secondary cataract, left eye: Secondary | ICD-10-CM | POA: Diagnosis not present

## 2022-12-13 ENCOUNTER — Other Ambulatory Visit: Payer: Self-pay | Admitting: Internal Medicine

## 2022-12-13 DIAGNOSIS — L304 Erythema intertrigo: Secondary | ICD-10-CM

## 2022-12-13 DIAGNOSIS — E119 Type 2 diabetes mellitus without complications: Secondary | ICD-10-CM

## 2022-12-21 ENCOUNTER — Other Ambulatory Visit: Payer: Self-pay | Admitting: Internal Medicine

## 2022-12-30 ENCOUNTER — Ambulatory Visit: Payer: Medicare PPO | Admitting: Internal Medicine

## 2022-12-30 ENCOUNTER — Encounter: Payer: Self-pay | Admitting: Internal Medicine

## 2022-12-30 VITALS — BP 144/78 | HR 83 | Ht 61.0 in | Wt 210.0 lb

## 2022-12-30 DIAGNOSIS — I872 Venous insufficiency (chronic) (peripheral): Secondary | ICD-10-CM | POA: Diagnosis not present

## 2022-12-30 DIAGNOSIS — Z23 Encounter for immunization: Secondary | ICD-10-CM | POA: Diagnosis not present

## 2022-12-30 DIAGNOSIS — I890 Lymphedema, not elsewhere classified: Secondary | ICD-10-CM | POA: Diagnosis not present

## 2022-12-30 DIAGNOSIS — I10 Essential (primary) hypertension: Secondary | ICD-10-CM

## 2022-12-30 DIAGNOSIS — E782 Mixed hyperlipidemia: Secondary | ICD-10-CM | POA: Diagnosis not present

## 2022-12-30 NOTE — Progress Notes (Addendum)
Established Patient Office Visit  Subjective:  Patient ID: Judith Arnold, female    DOB: 21-Mar-1950  Age: 72 y.o. MRN: [id number]  Chief Complaint  Patient presents with   Follow-up    6 week follow up    Patient comes in for her blood pressure follow-up today.  Is better than the last time but it is still elevated.  However patient feels well and insists that the blood pressure at home was always within normal limits. Still has bilateral stasis dermatitis and lymphedema, but better than before. Needs flu shot today.  Has rescheduled her mammogram. Will check labs next visit.    No other concerns at this time.   Past Medical History:  Diagnosis Date   Allergy    Arthritis    lower back, left shoulder   Asthma    Carpal tunnel syndrome on both sides    Dental bridge present    top   Dental crown present    multiple   Family history of adverse reaction to anesthesia    Father and sister - PONV   GERD (gastroesophageal reflux disease)    Glaucoma    Hypertension    Leaky heart valve    Pre-diabetes    Sleep apnea     Past Surgical History:  Procedure Laterality Date   CATARACT EXTRACTION W/PHACO Right 11/16/2016   Procedure: CATARACT EXTRACTION PHACO AND INTRAOCULAR LENS PLACEMENT (IOC)  right;  Surgeon: Lockie Mola, MD;  Location: [facility];  Service: Ophthalmology;  Laterality: Right;  pre diabetic latex sensitivity sleep apnea   CATARACT EXTRACTION W/PHACO Left 01/18/2017   Procedure: CATARACT EXTRACTION PHACO AND INTRAOCULAR LENS PLACEMENT (IOC) LEFT DIABETIC;  Surgeon: Lockie Mola, MD;  Location: [facility];  Service: Ophthalmology;  Laterality: Left;   COLONOSCOPY     COLONOSCOPY WITH PROPOFOL N/A 04/09/2018   Procedure: COLONOSCOPY WITH PROPOFOL;  Surgeon: Scot Jun, MD;  Location: [facility] END[facility]COPY;  Service: Endoscopy;  Laterality: N/A;   IRIDOTOMY / IRIDECTOMY Bilateral 2005   TOOTH EXTRACTION      Social  History   Socioeconomic History   Marital status: Divorced    Spouse name: Not on file   Number of children: Not on file   Years of education: Not on file   Highest education level: Not on file  Occupational History   Not on file  Tobacco Use   Smoking status: Former    Current packs/day: 0.00    Types: Cigarettes    Quit date: 28    Years since quitting: 36.8   Smokeless tobacco: Never  Vaping Use   Vaping status: Never Used  Substance and Sexual Activity   Alcohol use: Yes    Alcohol/week: 0.0 standard drinks of alcohol    Comment: 5x/yr   Drug use: No   Sexual activity: Not on file  Other Topics Concern   Not on file  Social History Narrative   Not on file   Social Determinants of Health   Financial Resource Strain: Not on file  Food Insecurity: Not on file  Transportation Needs: Not on file  Physical Activity: Not on file  Stress: Not on file  Social Connections: Not on file  Intimate Partner Violence: Not on file    Family History  Problem Relation Age of Onset   Hypertension Mother    Diabetes Father    Breast cancer Neg Hx     Allergies  Allergen Reactions   Latex Other (See Comments)  Gets mouth rash from latex dental gloves. (Latex IgE - 08/25/16 - Negative)   Pseudoephedrine-Guaifenesin Other (See Comments)    Severe Headache   Other Rash    Walnuts cause mouth rash    Review of Systems  Constitutional: Negative.  Negative for chills, fever, malaise/fatigue and weight loss.  HENT: Negative.    Eyes: Negative.   Respiratory: Negative.  Negative for cough and shortness of breath.   Cardiovascular: Negative.  Negative for chest pain, palpitations and leg swelling.  Gastrointestinal: Negative.  Negative for abdominal pain, constipation, diarrhea, heartburn, nausea and vomiting.  Genitourinary: Negative.  Negative for dysuria and flank pain.  Musculoskeletal: Negative.  Negative for joint pain and myalgias.  Neurological: Negative.  Negative  for dizziness and headaches.  Endo/Heme/Allergies: Negative.   Psychiatric/Behavioral: Negative.  Negative for depression and suicidal ideas. The patient is not nervous/anxious.        Objective:   BP (!) 144/78   Pulse 83   Ht 5\' 1"  (1.549 m)   Wt 210 lb (95.3 kg)   SpO2 97%   BMI 39.68 kg/m   Vitals:   12/30/22 1303  BP: (!) 144/78  Pulse: 83  Height: 5\' 1"  (1.549 m)  Weight: 210 lb (95.3 kg)  SpO2: 97%  BMI (Calculated): 39.7    Physical Exam Vitals and nursing note reviewed.  Constitutional:      Appearance: Normal appearance.  HENT:     Head: Normocephalic and atraumatic.     Nose: Nose normal.     Mouth/Throat:     Mouth: Mucous membranes are moist.     Pharynx: Oropharynx is clear.  Eyes:     Conjunctiva/sclera: Conjunctivae normal.     Pupils: Pupils are equal, round, and reactive to light.  Cardiovascular:     Rate and Rhythm: Normal rate and regular rhythm.     Pulses: Normal pulses.     Heart sounds: Normal heart sounds. No murmur heard. Pulmonary:     Effort: Pulmonary effort is normal.     Breath sounds: Normal breath sounds. No wheezing.  Abdominal:     General: Bowel sounds are normal.     Palpations: Abdomen is soft.     Tenderness: There is no abdominal tenderness. There is no right CVA tenderness or left CVA tenderness.  Musculoskeletal:        General: Normal range of motion.     Cervical back: Normal range of motion.     Right lower leg: Edema present.     Left lower leg: Edema present.  Skin:    General: Skin is warm and dry.  Neurological:     General: No focal deficit present.     Mental Status: She is alert and oriented to person, place, and time.  Psychiatric:        Mood and Affect: Mood normal.        Behavior: Behavior normal.      No results found for any visits on 12/30/22.  Recent Results (from the past 2160 hour(s))  POCT CBG (Fasting - Glucose)     Status: Abnormal   Collection Time: 10/21/22  1:59 PM  Result  Value Ref Range   Glucose Fasting, POC 103 (A) 70 - 99 mg/dL      Assessment & Plan:  Continue all medications. Monitor blood pressure at home. Flu shot given today. Patient will call and schedule her mammogram. Problem List Items Addressed This Visit     Mixed hyperlipidemia  Essential hypertension, benign - Primary   Stasis dermatitis of both legs   Lymphedema of lower extremity   Other Visit Diagnoses     Need for immunization against influenza       Relevant Orders   Flu Vaccine Trivalent High Dose (Fluad) (Completed)      Follow up for AWV.  Total time spent: 30 minutes  Margaretann Loveless, MD  12/30/2022   This document may have been prepared by [facility] Voice Recognition software and as such may include unintentional dictation errors.

## 2023-01-17 ENCOUNTER — Other Ambulatory Visit: Payer: Self-pay | Admitting: Internal Medicine

## 2023-01-17 DIAGNOSIS — M7989 Other specified soft tissue disorders: Secondary | ICD-10-CM

## 2023-01-18 ENCOUNTER — Other Ambulatory Visit: Payer: Self-pay | Admitting: Internal Medicine

## 2023-01-18 ENCOUNTER — Other Ambulatory Visit: Payer: Medicare PPO

## 2023-01-18 DIAGNOSIS — E119 Type 2 diabetes mellitus without complications: Secondary | ICD-10-CM | POA: Diagnosis not present

## 2023-01-18 DIAGNOSIS — E782 Mixed hyperlipidemia: Secondary | ICD-10-CM

## 2023-01-18 DIAGNOSIS — I10 Essential (primary) hypertension: Secondary | ICD-10-CM | POA: Diagnosis not present

## 2023-01-19 LAB — CBC WITH DIFF/PLATELET
Basophils Absolute: 0.1 10*3/uL (ref 0.0–0.2)
Basos: 1 %
EOS (ABSOLUTE): 0.4 10*3/uL (ref 0.0–0.4)
Eos: 7 %
Hematocrit: 34.9 % (ref 34.0–46.6)
Hemoglobin: 11.4 g/dL (ref 11.1–15.9)
Immature Grans (Abs): 0 10*3/uL (ref 0.0–0.1)
Immature Granulocytes: 0 %
Lymphocytes Absolute: 1.5 10*3/uL (ref 0.7–3.1)
Lymphs: 25 %
MCH: 28.6 pg (ref 26.6–33.0)
MCHC: 32.7 g/dL (ref 31.5–35.7)
MCV: 88 fL (ref 79–97)
Monocytes Absolute: 0.4 10*3/uL (ref 0.1–0.9)
Monocytes: 7 %
Neutrophils Absolute: 3.6 10*3/uL (ref 1.4–7.0)
Neutrophils: 60 %
Platelets: 241 10*3/uL (ref 150–450)
RBC: 3.98 x10E6/uL (ref 3.77–5.28)
RDW: 13.4 % (ref 11.7–15.4)
WBC: 6 10*3/uL (ref 3.4–10.8)

## 2023-01-19 LAB — CMP14+EGFR
ALT: 13 [IU]/L (ref 0–32)
AST: 21 [IU]/L (ref 0–40)
Albumin: 4.2 g/dL (ref 3.8–4.8)
Alkaline Phosphatase: 67 [IU]/L (ref 44–121)
BUN/Creatinine Ratio: 17 (ref 12–28)
BUN: 18 mg/dL (ref 8–27)
Bilirubin Total: 0.5 mg/dL (ref 0.0–1.2)
CO2: 21 mmol/L (ref 20–29)
Calcium: 9.8 mg/dL (ref 8.7–10.3)
Chloride: 105 mmol/L (ref 96–106)
Creatinine, Ser: 1.09 mg/dL — ABNORMAL HIGH (ref 0.57–1.00)
Globulin, Total: 2.1 g/dL (ref 1.5–4.5)
Glucose: 97 mg/dL (ref 70–99)
Potassium: 4.6 mmol/L (ref 3.5–5.2)
Sodium: 143 mmol/L (ref 134–144)
Total Protein: 6.3 g/dL (ref 6.0–8.5)
eGFR: 53 mL/min/{1.73_m2} — ABNORMAL LOW (ref >59)

## 2023-01-19 LAB — LIPID PANEL
Chol/HDL Ratio: 2.4 ratio (ref 0.0–4.4)
Cholesterol, Total: 159 mg/dL (ref 100–199)
HDL: 66 mg/dL (ref >39)
LDL Chol Calc (NIH): 73 mg/dL (ref 0–99)
Triglycerides: 110 mg/dL (ref 0–149)
VLDL Cholesterol Cal: 20 mg/dL (ref 5–40)

## 2023-01-19 LAB — HEMOGLOBIN A1C
Est. average glucose Bld gHb Est-mCnc: 120 mg/dL
Hgb A1c MFr Bld: 5.8 % — ABNORMAL HIGH (ref 4.8–5.6)

## 2023-01-20 ENCOUNTER — Ambulatory Visit: Payer: Medicare PPO | Admitting: Internal Medicine

## 2023-01-20 ENCOUNTER — Encounter: Payer: Self-pay | Admitting: Internal Medicine

## 2023-01-20 VITALS — BP 120/84 | HR 65 | Ht 61.0 in | Wt 204.0 lb

## 2023-01-20 VITALS — BP 124/70 | HR 65 | Ht 61.0 in | Wt 204.0 lb

## 2023-01-20 DIAGNOSIS — E782 Mixed hyperlipidemia: Secondary | ICD-10-CM

## 2023-01-20 DIAGNOSIS — I872 Venous insufficiency (chronic) (peripheral): Secondary | ICD-10-CM | POA: Diagnosis not present

## 2023-01-20 DIAGNOSIS — E1169 Type 2 diabetes mellitus with other specified complication: Secondary | ICD-10-CM | POA: Insufficient documentation

## 2023-01-20 DIAGNOSIS — E1159 Type 2 diabetes mellitus with other circulatory complications: Secondary | ICD-10-CM

## 2023-01-20 DIAGNOSIS — I152 Hypertension secondary to endocrine disorders: Secondary | ICD-10-CM | POA: Insufficient documentation

## 2023-01-20 DIAGNOSIS — I890 Lymphedema, not elsewhere classified: Secondary | ICD-10-CM

## 2023-01-20 DIAGNOSIS — Z0000 Encounter for general adult medical examination without abnormal findings: Secondary | ICD-10-CM

## 2023-01-20 DIAGNOSIS — G4733 Obstructive sleep apnea (adult) (pediatric): Secondary | ICD-10-CM | POA: Diagnosis not present

## 2023-01-20 DIAGNOSIS — J309 Allergic rhinitis, unspecified: Secondary | ICD-10-CM

## 2023-01-20 LAB — POCT CBG (FASTING - GLUCOSE)-MANUAL ENTRY: Glucose Fasting, POC: 71 mg/dL (ref 70–99)

## 2023-01-20 NOTE — Progress Notes (Signed)
Established Patient Office Visit  Subjective:  Patient ID: Judith Arnold, female    DOB: 08-16-1950  Age: 72 y.o. MRN: [id number]  Chief Complaint  Patient presents with   Annual Exam    3 month follow up, AWV.    Patient is here for her wellness visit today.  She has been taking her medications regularly and just had lab work done, results discussed, essentially stable. Her last colonoscopy was 2020. DEXA scan 2024. Waiting to get her mammogram for this year. PHQ-9/GAD score is 1/0. CFS is 0.    No other concerns at this time.   Past Medical History:  Diagnosis Date   Allergy    Arthritis    lower back, left shoulder   Asthma    Carpal tunnel syndrome on both sides    Dental bridge present    top   Dental crown present    multiple   Family history of adverse reaction to anesthesia    Father and sister - PONV   GERD (gastroesophageal reflux disease)    Glaucoma    Hypertension    Leaky heart valve    Pre-diabetes    Sleep apnea     Past Surgical History:  Procedure Laterality Date   CATARACT EXTRACTION W/PHACO Right 11/16/2016   Procedure: CATARACT EXTRACTION PHACO AND INTRAOCULAR LENS PLACEMENT (IOC)  right;  Surgeon: Lockie Mola, MD;  Location: [facility];  Service: Ophthalmology;  Laterality: Right;  pre diabetic latex sensitivity sleep apnea   CATARACT EXTRACTION W/PHACO Left 01/18/2017   Procedure: CATARACT EXTRACTION PHACO AND INTRAOCULAR LENS PLACEMENT (IOC) LEFT DIABETIC;  Surgeon: Lockie Mola, MD;  Location: [facility];  Service: Ophthalmology;  Laterality: Left;   COLONOSCOPY     COLONOSCOPY WITH PROPOFOL N/A 04/09/2018   Procedure: COLONOSCOPY WITH PROPOFOL;  Surgeon: Scot Jun, MD;  Location: [facility] ENDOSCOPY;  Service: Endoscopy;  Laterality: N/A;   IRIDOTOMY / IRIDECTOMY Bilateral 2005   TOOTH EXTRACTION      Social History   Socioeconomic History   Marital status: Divorced    Spouse name: Not on  file   Number of children: Not on file   Years of education: Not on file   Highest education level: Not on file  Occupational History   Not on file  Tobacco Use   Smoking status: Former    Current packs/day: 0.00    Types: Cigarettes    Quit date: 54    Years since quitting: 36.8   Smokeless tobacco: Never  Vaping Use   Vaping status: Never Used  Substance and Sexual Activity   Alcohol use: Yes    Alcohol/week: 0.0 standard drinks of alcohol    Comment: 5x/yr   Drug use: No   Sexual activity: Not on file  Other Topics Concern   Not on file  Social History Narrative   Not on file   Social Determinants of Health   Financial Resource Strain: Not on file  Food Insecurity: Not on file  Transportation Needs: Not on file  Physical Activity: Not on file  Stress: Not on file  Social Connections: Not on file  Intimate Partner Violence: Not on file    Family History  Problem Relation Age of Onset   Hypertension Mother    Diabetes Father    Breast cancer Neg Hx     Allergies  Allergen Reactions   Latex Other (See Comments)    Gets mouth rash from latex dental gloves. (Latex IgE -  08/25/16 - Negative)   Pseudoephedrine-Guaifenesin Other (See Comments)    Severe Headache   Other Rash    Walnuts cause mouth rash    Review of Systems  Constitutional: Negative.  Negative for chills, diaphoresis, fever, malaise/fatigue and weight loss.  HENT: Negative.  Negative for congestion, ear discharge, ear pain, nosebleeds, sinus pain and sore throat.   Eyes: Negative.  Negative for blurred vision, double vision, discharge and redness.  Respiratory: Negative.  Negative for cough, shortness of breath and wheezing.   Cardiovascular: Negative.  Negative for chest pain, palpitations, orthopnea and leg swelling.  Gastrointestinal: Negative.  Negative for abdominal pain, blood in stool, constipation, diarrhea, heartburn, melena, nausea and vomiting.  Genitourinary: Negative.  Negative for  dysuria, flank pain and urgency.  Musculoskeletal:  Positive for joint pain. Negative for back pain, falls, myalgias and neck pain.  Skin:  Positive for rash (mild stasis dermatitis.).  Neurological: Negative.  Negative for dizziness, tingling, tremors, weakness and headaches.  Endo/Heme/Allergies: Negative.   Psychiatric/Behavioral: Negative.  Negative for depression, memory loss and suicidal ideas. The patient is not nervous/anxious and does not have insomnia.        Objective:   BP 120/84   Pulse 65   Ht 5\' 1"  (1.549 m)   Wt 204 lb (92.5 kg)   SpO2 96%   BMI 38.55 kg/m   Vitals:   01/20/23 1406  BP: 120/84  Pulse: 65  Height: 5\' 1"  (1.549 m)  Weight: 204 lb (92.5 kg)  SpO2: 96%  BMI (Calculated): 38.57    Physical Exam Vitals and nursing note reviewed.  Constitutional:      General: She is not in acute distress.    Appearance: Normal appearance.  HENT:     Head: Normocephalic and atraumatic.     Nose: Nose normal.     Mouth/Throat:     Mouth: Mucous membranes are moist.     Pharynx: Oropharynx is clear.  Eyes:     Conjunctiva/sclera: Conjunctivae normal.     Pupils: Pupils are equal, round, and reactive to light.  Neck:     Vascular: No carotid bruit.  Cardiovascular:     Rate and Rhythm: Normal rate and regular rhythm.     Pulses: Normal pulses.     Heart sounds: Normal heart sounds. No murmur heard. Pulmonary:     Effort: Pulmonary effort is normal.     Breath sounds: No wheezing, rhonchi or rales.  Abdominal:     General: Bowel sounds are normal.     Palpations: Abdomen is soft.     Tenderness: There is no abdominal tenderness. There is no right CVA tenderness or left CVA tenderness.  Musculoskeletal:        General: Normal range of motion.     Cervical back: Normal range of motion.     Right lower leg: No edema.     Left lower leg: No edema.  Lymphadenopathy:     Cervical: No cervical adenopathy.  Skin:    General: Skin is warm and dry.   Neurological:     General: No focal deficit present.     Mental Status: She is alert and oriented to person, place, and time.  Psychiatric:        Mood and Affect: Mood normal.        Behavior: Behavior normal.      Results for orders placed or performed in visit on 01/20/23  POCT CBG (Fasting - Glucose)  Result Value Ref Range  Glucose Fasting, POC 71 70 - 99 mg/dL    Recent Results (from the past 2160 hour(s))  Lipid panel     Status: None   Collection Time: 01/18/23 11:10 AM  Result Value Ref Range   Cholesterol, Total 159 100 - 199 mg/dL   Triglycerides 629 0 - 149 mg/dL   HDL 66 >52 mg/dL   VLDL Cholesterol Cal 20 5 - 40 mg/dL   LDL Chol Calc (NIH) 73 0 - 99 mg/dL   Chol/HDL Ratio 2.4 0.0 - 4.4 ratio    Comment:                                   T. Chol/HDL Ratio                                             Men  Women                               1/2 Avg.Risk  3.4    3.3                                   Avg.Risk  5.0    4.4                                2X Avg.Risk  9.6    7.1                                3X Avg.Risk 23.4   11.0   CBC With Diff/Platelet     Status: None   Collection Time: 01/18/23 11:10 AM  Result Value Ref Range   WBC 6.0 3.4 - 10.8 x10E3/uL   RBC 3.98 3.77 - 5.28 x10E6/uL   Hemoglobin 11.4 11.1 - 15.9 g/dL   Hematocrit 84.1 32.4 - 46.6 %   MCV 88 79 - 97 fL   MCH 28.6 26.6 - 33.0 pg   MCHC 32.7 31.5 - 35.7 g/dL   RDW 40.1 02.7 - 25.3 %   Platelets 241 150 - 450 x10E3/uL   Neutrophils 60 Not Estab. %   Lymphs 25 Not Estab. %   Monocytes 7 Not Estab. %   Eos 7 Not Estab. %   Basos 1 Not Estab. %   Neutrophils Absolute 3.6 1.4 - 7.0 x10E3/uL   Lymphocytes Absolute 1.5 0.7 - 3.1 x10E3/uL   Monocytes Absolute 0.4 0.1 - 0.9 x10E3/uL   EOS (ABSOLUTE) 0.4 0.0 - 0.4 x10E3/uL   Basophils Absolute 0.1 0.0 - 0.2 x10E3/uL   Immature Granulocytes 0 Not Estab. %   Immature Grans (Abs) 0.0 0.0 - 0.1 x10E3/uL  Hemoglobin A1c     Status: Abnormal    Collection Time: 01/18/23 11:10 AM  Result Value Ref Range   Hgb A1c MFr Bld 5.8 (H) 4.8 - 5.6 %    Comment:          Prediabetes: 5.7 - 6.4          Diabetes: >6.4          Glycemic  control for adults with diabetes: <7.0    Est. average glucose Bld gHb Est-mCnc 120 mg/dL  [id number]     Status: Abnormal   Collection Time: 01/18/23 11:12 AM  Result Value Ref Range   Glucose 97 70 - 99 mg/dL   BUN 18 8 - 27 mg/dL   Creatinine, Ser 7.82 (H) 0.57 - 1.00 mg/dL   eGFR 53 (L) >95 AO/ZHY/8.65   BUN/Creatinine Ratio 17 12 - 28   Sodium 143 134 - 144 mmol/L   Potassium 4.6 3.5 - 5.2 mmol/L   Chloride 105 96 - 106 mmol/L   CO2 21 20 - 29 mmol/L   Calcium 9.8 8.7 - 10.3 mg/dL   Total Protein 6.3 6.0 - 8.5 g/dL   Albumin 4.2 3.8 - 4.8 g/dL   Globulin, Total 2.1 1.5 - 4.5 g/dL   Bilirubin Total 0.5 0.0 - 1.2 mg/dL   Alkaline Phosphatase 67 44 - 121 IU/L   AST 21 0 - 40 IU/L   ALT 13 0 - 32 IU/L  POCT CBG (Fasting - Glucose)     Status: None   Collection Time: 01/20/23  1:33 PM  Result Value Ref Range   Glucose Fasting, POC 71 70 - 99 mg/dL      Assessment & Plan:  Patient advised to continue taking all her medications regularly. Will call and set up her mammogram appointment. Problem List Items Addressed This Visit     OSA on CPAP   Stasis dermatitis of both legs   Lymphedema of lower extremity   Hypertension associated with diabetes (HCC)   Combined hyperlipidemia associated with type 2 diabetes mellitus (HCC)   Other Visit Diagnoses     Annual wellness visit    -  Primary       Return in about 4 months (around 05/20/2023).   Total time spent: 30 minutes  Margaretann Loveless, MD  01/20/2023   This document may have been prepared by [facility] Voice Recognition software and as such may include unintentional dictation errors.

## 2023-01-23 ENCOUNTER — Encounter: Payer: Self-pay | Admitting: Internal Medicine

## 2023-01-23 NOTE — Progress Notes (Signed)
cancelled

## 2023-01-30 ENCOUNTER — Ambulatory Visit: Payer: Medicare PPO | Admitting: Internal Medicine

## 2023-02-16 ENCOUNTER — Encounter (INDEPENDENT_AMBULATORY_CARE_PROVIDER_SITE_OTHER): Payer: Self-pay | Admitting: Nurse Practitioner

## 2023-02-16 ENCOUNTER — Ambulatory Visit (INDEPENDENT_AMBULATORY_CARE_PROVIDER_SITE_OTHER): Payer: Medicare PPO | Admitting: Nurse Practitioner

## 2023-02-16 VITALS — BP 135/64 | HR 73 | Resp 16 | Ht 61.0 in | Wt 205.0 lb

## 2023-02-16 DIAGNOSIS — E119 Type 2 diabetes mellitus without complications: Secondary | ICD-10-CM | POA: Diagnosis not present

## 2023-02-16 DIAGNOSIS — I10 Essential (primary) hypertension: Secondary | ICD-10-CM | POA: Diagnosis not present

## 2023-02-16 DIAGNOSIS — I890 Lymphedema, not elsewhere classified: Secondary | ICD-10-CM

## 2023-02-20 ENCOUNTER — Encounter (INDEPENDENT_AMBULATORY_CARE_PROVIDER_SITE_OTHER): Payer: Self-pay | Admitting: Nurse Practitioner

## 2023-02-20 NOTE — Progress Notes (Signed)
Subjective:    Patient ID: Judith Arnold, female    DOB: [birth year], 72 y.o.   MRN: [id number] Chief Complaint  Patient presents with   New Patient (Initial Visit)    Ref Dorene Ar consult leg swelling    Judith Arnold is a 72 year old female who presents today due to lower extremity edema.  The patient has had longstanding leg swelling over the last several years.  However recently it is worsened.  She has been having redness and discoloration of her bilateral lower extremities.  She had an episode of cellulitis treated with antibiotics.  She also has dryness of the areas of redness despite use of lotion.  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  She has previously worn compression but is unable to do so due to significant arthritis issues.    Review of Systems  Cardiovascular:  Positive for leg swelling.  Musculoskeletal:  Positive for arthralgias and gait problem.  All other systems reviewed and are negative.      Objective:   Physical Exam Vitals reviewed.  HENT:     Head: Normocephalic.  Cardiovascular:     Rate and Rhythm: Normal rate.  Pulmonary:     Effort: Pulmonary effort is normal.  Musculoskeletal:     Right lower leg: Edema present.     Left lower leg: Edema present.  Skin:    General: Skin is warm and dry.  Neurological:     Mental Status: She is alert and oriented to person, place, and time.  Psychiatric:        Mood and Affect: Mood normal.        Behavior: Behavior normal.        Thought Content: Thought content normal.        Judgment: Judgment normal.     BP 135/64 (BP Location: Left Arm)   Pulse 73   Resp 16   Ht 5\' 1"  (1.549 m)   Wt 205 lb (93 kg)   BMI 38.73 kg/m   Past Medical History:  Diagnosis Date   Allergy    Arthritis    lower back, left shoulder   Asthma    Carpal tunnel syndrome on both sides    Dental bridge present    top   Dental crown present    multiple   Family history of adverse  reaction to anesthesia    Father and sister - PONV   GERD (gastroesophageal reflux disease)    Glaucoma    Hypertension    Leaky heart valve    Pre-diabetes    Sleep apnea     Social History   Socioeconomic History   Marital status: Divorced    Spouse name: Not on file   Number of children: Not on file   Years of education: Not on file   Highest education level: Not on file  Occupational History   Not on file  Tobacco Use   Smoking status: Former    Current packs/day: 0.00    Types: Cigarettes    Quit date: 36    Years since quitting: 36.9   Smokeless tobacco: Never  Vaping Use   Vaping status: Never Used  Substance and Sexual Activity   Alcohol use: Yes    Alcohol/week: 0.0 standard drinks of alcohol    Comment: 5x/yr   Drug use: No   Sexual activity: Not on file  Other Topics Concern   Not on file  Social History  Narrative   Not on file   Social Determinants of Health   Financial Resource Strain: Not on file  Food Insecurity: Not on file  Transportation Needs: Not on file  Physical Activity: Not on file  Stress: Not on file  Social Connections: Not on file  Intimate Partner Violence: Not on file    Past Surgical History:  Procedure Laterality Date   CATARACT EXTRACTION W/PHACO Right 11/16/2016   Procedure: CATARACT EXTRACTION PHACO AND INTRAOCULAR LENS PLACEMENT (IOC)  right;  Surgeon: Lockie Mola, MD;  Location: [facility];  Service: Ophthalmology;  Laterality: Right;  pre diabetic latex sensitivity sleep apnea   CATARACT EXTRACTION W/PHACO Left 01/18/2017   Procedure: CATARACT EXTRACTION PHACO AND INTRAOCULAR LENS PLACEMENT (IOC) LEFT DIABETIC;  Surgeon: Lockie Mola, MD;  Location: [facility];  Service: Ophthalmology;  Laterality: Left;   COLONOSCOPY     COLONOSCOPY WITH PROPOFOL N/A 04/09/2018   Procedure: COLONOSCOPY WITH PROPOFOL;  Surgeon: Scot Jun, MD;  Location: [facility];  Service: Endoscopy;   Laterality: N/A;   IRIDOTOMY / IRIDECTOMY Bilateral 2005   TOOTH EXTRACTION      Family History  Problem Relation Age of Onset   Hypertension Mother    Diabetes Father    Breast cancer Neg Hx     Allergies  Allergen Reactions   Latex Other (See Comments)    Gets mouth rash from latex dental gloves. (Latex IgE - 08/25/16 - Negative)   Pseudoephedrine-Guaifenesin Other (See Comments)    Severe Headache   Other Rash    Walnuts cause mouth rash       Latest Ref Rng & Units 01/18/2023   11:10 AM 09/21/2022   10:33 AM 05/20/2022    3:20 PM  CBC  WBC 3.4 - 10.8 x10E3/uL 6.0  5.6  5.6   Hemoglobin 11.1 - 15.9 g/dL 16.1  09.6  04.5   Hematocrit 34.0 - 46.6 % 34.9  32.7  35.0   Platelets 150 - 450 x10E3/uL 241  231        CMP     Component Value Date/Time   NA 143 01/18/2023 1112   K 4.6 01/18/2023 1112   CL 105 01/18/2023 1112   CO2 21 01/18/2023 1112   GLUCOSE 97 01/18/2023 1112   BUN 18 01/18/2023 1112   CREATININE 1.09 (H) 01/18/2023 1112   CALCIUM 9.8 01/18/2023 1112   PROT 6.3 01/18/2023 1112   ALBUMIN 4.2 01/18/2023 1112   AST 21 01/18/2023 1112   ALT 13 01/18/2023 1112   ALKPHOS 67 01/18/2023 1112   BILITOT 0.5 01/18/2023 1112   EGFR 53 (L) 01/18/2023 1112     No results found.     Assessment & Plan:   1. Lymphedema of lower extremity Recommend:  I have had a long discussion with the patient regarding swelling and why it  causes symptoms.  Patient will begin wearing graduated compression on a daily basis a prescription was given. The patient will  wear the stockings first thing in the morning and removing them in the evening. The patient is instructed specifically not to sleep in the stockings.   In addition, behavioral modification will be initiated.  This will include frequent elevation, use of over the counter pain medications and exercise such as walking.  Consideration for a lymph pump will also be made based upon the effectiveness of conservative  therapy.  This would help to improve the edema control and prevent sequela such as ulcers and infections  Patient should undergo duplex ultrasound of the venous system to ensure that DVT or reflux is not present.  The patient will follow-up with me after the ultrasound.   2. Essential hypertension, benign Continue antihypertensive medications as already ordered, these medications have been reviewed and there are no changes at this time.  3. Type 2 diabetes mellitus without complication, without long-term current use of insulin (HCC) Continue hypoglycemic medications as already ordered, these medications have been reviewed and there are no changes at this time.  Hgb A1C to be monitored as already arranged by primary service   Current Outpatient Medications on File Prior to Visit  Medication Sig Dispense Refill   albuterol (VENTOLIN HFA) 108 (90 Base) MCG/ACT inhaler TAKE 2 PUFFS BY MOUTH 4 TIMES A DAY AS NEEDED 8.5 each 6   aspirin EC 81 MG tablet Take by mouth.     bimatoprost (LUMIGAN) 0.01 % SOLN Apply to eye.     Cetirizine HCl 10 MG CAPS Take by mouth.     Cholecalciferol (VITAMIN D3) 50000 units TABS Take by mouth once a week.     clotrimazole-betamethasone (LOTRISONE) cream APPLY TO AFFECTED AREA TWICE A DAY 75 g 2   FeFum-FePoly-FA-B Cmp-C-Biot (INTEGRA PLUS) CAPS Take 1 each by mouth daily. 30 capsule 6   Fluticasone-Salmeterol (ADVAIR DISKUS) 250-50 MCG/DOSE AEPB Inhale into the lungs.     furosemide (LASIX) 20 MG tablet TAKE 1 TABLET BY MOUTH EVERY DAY 90 tablet 3   gabapentin (NEURONTIN) 300 MG capsule TAKE 1 CAPSULE BY MOUTH EVERYDAY AT BEDTIME 90 capsule 3   Ibuprofen (ADVIL) 200 MG CAPS Take by mouth.     lansoprazole (PREVACID) 15 MG capsule TAKE 1 CAPSULE BY MOUTH TWICE DAILY 30 MINUTES BEFORE MEALS. 180 capsule 3   metFORMIN (GLUCOPHAGE-XR) 500 MG 24 hr tablet TAKE 1 TABLET BY MOUTH EVERY DAY 90 tablet 3   Polyethyl Glycol-Propyl Glycol (SYSTANE OP) Apply to eye as  needed.     rosuvastatin (CRESTOR) 20 MG tablet TAKE 1 TABLET BY MOUTH EVERY DAY 90 tablet 3   SINGULAIR 10 MG tablet TAKE 1 TABLET BY MOUTH EVERY DAY 90 tablet 3   timolol (BETIMOL) 0.5 % ophthalmic solution Place 1 drop into both eyes 2 (two) times daily.     Triamcinolone Acetonide (TRIAMCINOLONE 0.1 % CREAM : EUCERIN) CREA Apply 1 Application topically 2 (two) times daily. 80 each 2   valsartan (DIOVAN) 80 MG tablet Take 2 tablets (160 mg total) by mouth daily. 90 tablet 3   VASCEPA 1 g capsule TAKE 2 CAPSULES BY MOUTH TWICE A DAY 360 capsule 2   lansoprazole (PREVACID) 30 MG capsule daily. (Patient not taking: Reported on 01/20/2023)  3   No current facility-administered medications on file prior to visit.    There are no Patient Instructions on file for this visit. No follow-ups on file.   Georgiana Spinner, NP

## 2023-03-23 ENCOUNTER — Other Ambulatory Visit (INDEPENDENT_AMBULATORY_CARE_PROVIDER_SITE_OTHER): Payer: Self-pay | Admitting: Nurse Practitioner

## 2023-03-23 DIAGNOSIS — R600 Localized edema: Secondary | ICD-10-CM

## 2023-03-24 ENCOUNTER — Ambulatory Visit (INDEPENDENT_AMBULATORY_CARE_PROVIDER_SITE_OTHER): Payer: Medicare PPO | Admitting: Nurse Practitioner

## 2023-03-24 ENCOUNTER — Encounter (INDEPENDENT_AMBULATORY_CARE_PROVIDER_SITE_OTHER): Payer: Medicare PPO

## 2023-04-21 ENCOUNTER — Ambulatory Visit (INDEPENDENT_AMBULATORY_CARE_PROVIDER_SITE_OTHER): Payer: Medicare PPO | Admitting: Nurse Practitioner

## 2023-04-21 ENCOUNTER — Encounter (INDEPENDENT_AMBULATORY_CARE_PROVIDER_SITE_OTHER): Payer: Self-pay | Admitting: Nurse Practitioner

## 2023-04-21 ENCOUNTER — Ambulatory Visit (INDEPENDENT_AMBULATORY_CARE_PROVIDER_SITE_OTHER): Payer: Medicare PPO

## 2023-04-21 VITALS — BP 122/71 | HR 66 | Resp 18 | Ht 61.5 in | Wt 205.0 lb

## 2023-04-21 DIAGNOSIS — I10 Essential (primary) hypertension: Secondary | ICD-10-CM

## 2023-04-21 DIAGNOSIS — R600 Localized edema: Secondary | ICD-10-CM

## 2023-04-21 DIAGNOSIS — E119 Type 2 diabetes mellitus without complications: Secondary | ICD-10-CM

## 2023-04-21 DIAGNOSIS — I890 Lymphedema, not elsewhere classified: Secondary | ICD-10-CM

## 2023-04-23 NOTE — Progress Notes (Signed)
 Subjective:    Patient ID: Judith Arnold, female    DOB: 03/28/1950, 73 y.o.   MRN: [id number] Chief Complaint  Patient presents with   Follow-up    pt conv BIL reflux    Judith Arnold is a 73 year old female who presents today due to lower extremity edema.  The patient has had longstanding leg swelling over the last several years.  However recently it is worsened.  She has been having redness and discoloration of her bilateral lower extremities.  She had an episode of cellulitis treated with antibiotics.  She also has dryness of the areas of redness despite use of lotion.  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  She has previously worn compression but is unable to do so due to significant arthritis issues.   Today noninvasive study showed no evidence of DVT or superficial phlebitis bilaterally.  No evidence of deep venous insufficiency or superficial venous reflux bilaterally.    Review of Systems  Cardiovascular:  Positive for leg swelling.  Skin:  Negative for wound.  All other systems reviewed and are negative.      Objective:   Physical Exam Vitals reviewed.  HENT:     Head: Normocephalic.  Cardiovascular:     Rate and Rhythm: Normal rate.     Pulses: Normal pulses.  Pulmonary:     Effort: Pulmonary effort is normal.  Musculoskeletal:     Right lower leg: Edema present.     Left lower leg: Edema present.  Skin:    General: Skin is warm and dry.  Neurological:     Mental Status: She is alert and oriented to person, place, and time.  Psychiatric:        Mood and Affect: Mood normal.        Behavior: Behavior normal.        Thought Content: Thought content normal.        Judgment: Judgment normal.     BP 122/71   Pulse 66   Resp 18   Ht 5' 1.5 (1.562 m)   Wt 205 lb (93 kg)   BMI 38.11 kg/m   Past Medical History:  Diagnosis Date   Allergy    Arthritis    lower back, left shoulder   Asthma    Carpal tunnel  syndrome on both sides    Dental bridge present    top   Dental crown present    multiple   Family history of adverse reaction to anesthesia    Father and sister - PONV   GERD (gastroesophageal reflux disease)    Glaucoma    Hypertension    Leaky heart valve    Pre-diabetes    Sleep apnea     Social History   Socioeconomic History   Marital status: Divorced    Spouse name: Not on file   Number of children: Not on file   Years of education: Not on file   Highest education level: Not on file  Occupational History   Not on file  Tobacco Use   Smoking status: Former    Current packs/day: 0.00    Types: Cigarettes    Quit date: 60    Years since quitting: 37.1   Smokeless tobacco: Never  Vaping Use   Vaping status: Never Used  Substance and Sexual Activity   Alcohol use: Yes    Alcohol/week: 0.0 standard drinks of alcohol    Comment: 5x/yr  Drug use: No   Sexual activity: Not on file  Other Topics Concern   Not on file  Social History Narrative   Not on file   Social Drivers of Health   Financial Resource Strain: Not on file  Food Insecurity: Not on file  Transportation Needs: Not on file  Physical Activity: Not on file  Stress: Not on file  Social Connections: Not on file  Intimate Partner Violence: Not on file    Past Surgical History:  Procedure Laterality Date   CATARACT EXTRACTION W/PHACO Right 11/16/2016   Procedure: CATARACT EXTRACTION PHACO AND INTRAOCULAR LENS PLACEMENT (IOC)  right;  Surgeon: Mittie Gaskin, MD;  Location: [facility];  Service: Ophthalmology;  Laterality: Right;  pre diabetic latex sensitivity sleep apnea   CATARACT EXTRACTION W/PHACO Left 01/18/2017   Procedure: CATARACT EXTRACTION PHACO AND INTRAOCULAR LENS PLACEMENT (IOC) LEFT DIABETIC;  Surgeon: Mittie Gaskin, MD;  Location: [facility];  Service: Ophthalmology;  Laterality: Left;   COLONOSCOPY     COLONOSCOPY WITH PROPOFOL  N/A 04/09/2018    Procedure: COLONOSCOPY WITH PROPOFOL ;  Surgeon: Viktoria Lamar DASEN, MD;  Location: [facility];  Service: Endoscopy;  Laterality: N/A;   IRIDOTOMY / IRIDECTOMY Bilateral 2005   TOOTH EXTRACTION      Family History  Problem Relation Age of Onset   Hypertension Mother    Diabetes Father    Breast cancer Neg Hx     Allergies  Allergen Reactions   Latex Other (See Comments)    Gets mouth rash from latex dental gloves. (Latex IgE - 08/25/16 - Negative)   Pseudoephedrine-Guaifenesin Other (See Comments)    Severe Headache   Other Rash    Walnuts cause mouth rash       Latest Ref Rng & Units 01/18/2023   11:10 AM 09/21/2022   10:33 AM 05/20/2022    3:20 PM  CBC  WBC 3.4 - 10.8 x10E3/uL 6.0  5.6  5.6   Hemoglobin 11.1 - 15.9 g/dL 88.5  89.3  88.3   Hematocrit 34.0 - 46.6 % 34.9  32.7  35.0   Platelets 150 - 450 x10E3/uL 241  231        CMP     Component Value Date/Time   NA 143 01/18/2023 1112   K 4.6 01/18/2023 1112   CL 105 01/18/2023 1112   CO2 21 01/18/2023 1112   GLUCOSE 97 01/18/2023 1112   BUN 18 01/18/2023 1112   CREATININE 1.09 (H) 01/18/2023 1112   CALCIUM 9.8 01/18/2023 1112   PROT 6.3 01/18/2023 1112   ALBUMIN 4.2 01/18/2023 1112   AST 21 01/18/2023 1112   ALT 13 01/18/2023 1112   ALKPHOS 67 01/18/2023 1112   BILITOT 0.5 01/18/2023 1112   EGFR 53 (L) 01/18/2023 1112     No results found.     Assessment & Plan:   1. Lymphedema of lower extremity (Primary) I had a discussion regarding lymphedema and treatment for the edema.  Typical conservative therapy goals use of medical grade compression, elevation and activity.  Additional compression will be difficult for the patient.  We discussed compression wraps but her mobility will also limit this as well.  The patient will attempt to elevate her lower extremities more in addition to more activity.  We discussed a lymphedema pump but at this time the patient wishes to attempt more conservative therapies.  Will  have her return in 6 months or sooner if she has worsening edema, cellulitis or weeping.  2. Essential  hypertension, benign Continue antihypertensive medications as already ordered, these medications have been reviewed and there are no changes at this time.  3. Type 2 diabetes mellitus without complication, without long-term current use of insulin (HCC) Continue hypoglycemic medications as already ordered, these medications have been reviewed and there are no changes at this time.  Hgb A1C to be monitored as already arranged by primary service   Current Outpatient Medications on File Prior to Visit  Medication Sig Dispense Refill   albuterol (VENTOLIN HFA) 108 (90 Base) MCG/ACT inhaler TAKE 2 PUFFS BY MOUTH 4 TIMES A DAY AS NEEDED 8.5 each 6   aspirin EC 81 MG tablet Take by mouth.     bimatoprost (LUMIGAN) 0.01 % SOLN Apply to eye.     Cetirizine HCl 10 MG CAPS Take by mouth.     Cholecalciferol (VITAMIN D3) 50000 units TABS Take by mouth once a week.     clotrimazole -betamethasone  (LOTRISONE ) cream APPLY TO AFFECTED AREA TWICE A DAY 75 g 2   FeFum-FePoly-FA-B Cmp-C-Biot (INTEGRA PLUS ) CAPS Take 1 each by mouth daily. 30 capsule 6   Fluticasone-Salmeterol (ADVAIR DISKUS) 250-50 MCG/DOSE AEPB Inhale into the lungs.     furosemide  (LASIX ) 20 MG tablet TAKE 1 TABLET BY MOUTH EVERY DAY 90 tablet 3   gabapentin  (NEURONTIN ) 300 MG capsule TAKE 1 CAPSULE BY MOUTH EVERYDAY AT BEDTIME 90 capsule 3   Ibuprofen (ADVIL) 200 MG CAPS Take by mouth.     lansoprazole (PREVACID) 15 MG capsule TAKE 1 CAPSULE BY MOUTH TWICE DAILY 30 MINUTES BEFORE MEALS. 180 capsule 3   metFORMIN (GLUCOPHAGE-XR) 500 MG 24 hr tablet TAKE 1 TABLET BY MOUTH EVERY DAY 90 tablet 3   Polyethyl Glycol-Propyl Glycol (SYSTANE OP) Apply to eye as needed.     rosuvastatin (CRESTOR) 20 MG tablet TAKE 1 TABLET BY MOUTH EVERY DAY 90 tablet 3   SINGULAIR 10 MG tablet TAKE 1 TABLET BY MOUTH EVERY DAY 90 tablet 3   timolol  (BETIMOL ) 0.5 %  ophthalmic solution Place 1 drop into both eyes 2 (two) times daily.     Triamcinolone  Acetonide (TRIAMCINOLONE  0.1 % CREAM : EUCERIN) CREA Apply 1 Application topically 2 (two) times daily. 80 each 2   valsartan  (DIOVAN ) 80 MG tablet Take 2 tablets (160 mg total) by mouth daily. 90 tablet 3   VASCEPA 1 g capsule TAKE 2 CAPSULES BY MOUTH TWICE A DAY 360 capsule 2   lansoprazole (PREVACID) 30 MG capsule daily. (Patient not taking: Reported on 04/21/2023)  3   No current facility-administered medications on file prior to visit.    There are no Patient Instructions on file for this visit. No follow-ups on file.   Denaya Horn E Kween Bacorn, NP

## 2023-05-22 ENCOUNTER — Encounter: Payer: Self-pay | Admitting: Internal Medicine

## 2023-05-22 ENCOUNTER — Ambulatory Visit: Payer: Medicare PPO | Admitting: Internal Medicine

## 2023-05-22 VITALS — BP 144/80 | HR 82 | Ht 61.0 in | Wt 197.6 lb

## 2023-05-22 DIAGNOSIS — M25512 Pain in left shoulder: Secondary | ICD-10-CM

## 2023-05-22 DIAGNOSIS — G4733 Obstructive sleep apnea (adult) (pediatric): Secondary | ICD-10-CM | POA: Diagnosis not present

## 2023-05-22 DIAGNOSIS — E782 Mixed hyperlipidemia: Secondary | ICD-10-CM | POA: Diagnosis not present

## 2023-05-22 DIAGNOSIS — E1159 Type 2 diabetes mellitus with other circulatory complications: Secondary | ICD-10-CM | POA: Diagnosis not present

## 2023-05-22 DIAGNOSIS — K219 Gastro-esophageal reflux disease without esophagitis: Secondary | ICD-10-CM

## 2023-05-22 DIAGNOSIS — I890 Lymphedema, not elsewhere classified: Secondary | ICD-10-CM | POA: Diagnosis not present

## 2023-05-22 DIAGNOSIS — I152 Hypertension secondary to endocrine disorders: Secondary | ICD-10-CM

## 2023-05-22 DIAGNOSIS — E1169 Type 2 diabetes mellitus with other specified complication: Secondary | ICD-10-CM | POA: Diagnosis not present

## 2023-05-22 DIAGNOSIS — L304 Erythema intertrigo: Secondary | ICD-10-CM | POA: Diagnosis not present

## 2023-05-22 DIAGNOSIS — Z1231 Encounter for screening mammogram for malignant neoplasm of breast: Secondary | ICD-10-CM | POA: Diagnosis not present

## 2023-05-22 DIAGNOSIS — I872 Venous insufficiency (chronic) (peripheral): Secondary | ICD-10-CM | POA: Diagnosis not present

## 2023-05-22 DIAGNOSIS — E119 Type 2 diabetes mellitus without complications: Secondary | ICD-10-CM | POA: Diagnosis not present

## 2023-05-22 NOTE — Progress Notes (Signed)
 Established Patient Office Visit  Subjective:  Patient ID: Judith Arnold, female    DOB: 05-18-1950  Age: 74 y.o. MRN: [id number]  Chief Complaint  Patient presents with   Follow-up    4 month follow up    Patient is here for her follow-up today.  She is generally feeling well but complains of multiple arthritic aches and pains including her wrists hips and knees.  She has to use a walker for ambulation.  Patient also has bilateral lymphedema with stasis dermatitis.  She was evaluated by the vascular surgeon and her venous Dopplers were negative for DVT.  She has been advised to wear compression stockings, keep her legs elevated, and maybe consider lymphedema pump. She has no complaints of chest pain or shortness of breath, no nausea vomiting or diarrhea.  Her blood pressure is slightly above normal today.  Usually it stays stable.    No other concerns at this time.   Past Medical History:  Diagnosis Date   Allergy    Arthritis    lower back, left shoulder   Asthma    Carpal tunnel syndrome on both sides    Dental bridge present    top   Dental crown present    multiple   Family history of adverse reaction to anesthesia    Father and sister - PONV   GERD (gastroesophageal reflux disease)    Glaucoma    Hypertension    Leaky heart valve    Pre-diabetes    Sleep apnea     Past Surgical History:  Procedure Laterality Date   CATARACT EXTRACTION W/PHACO Right 11/16/2016   Procedure: CATARACT EXTRACTION PHACO AND INTRAOCULAR LENS PLACEMENT (IOC)  right;  Surgeon: Lockie Mola, MD;  Location: [facility];  Service: Ophthalmology;  Laterality: Right;  pre diabetic latex sensitivity sleep apnea   CATARACT EXTRACTION W/PHACO Left 01/18/2017   Procedure: CATARACT EXTRACTION PHACO AND INTRAOCULAR LENS PLACEMENT (IOC) LEFT DIABETIC;  Surgeon: Lockie Mola, MD;  Location: [facility];  Service: Ophthalmology;  Laterality: Left;   COLONOSCOPY      COLONOSCOPY WITH PROPOFOL N/A 04/09/2018   Procedure: COLONOSCOPY WITH PROPOFOL;  Surgeon: Scot Jun, MD;  Location: [facility];  Service: Endoscopy;  Laterality: N/A;   IRIDOTOMY / IRIDECTOMY Bilateral 2005   TOOTH EXTRACTION      Social History   Socioeconomic History   Marital status: Divorced    Spouse name: Not on file   Number of children: Not on file   Years of education: Not on file   Highest education level: Not on file  Occupational History   Not on file  Tobacco Use   Smoking status: Former    Current packs/day: 0.00    Types: Cigarettes    Quit date: 5    Years since quitting: 37.2   Smokeless tobacco: Never  Vaping Use   Vaping status: Never Used  Substance and Sexual Activity   Alcohol use: Yes    Alcohol/week: 0.0 standard drinks of alcohol    Comment: 5x/yr   Drug use: No   Sexual activity: Not on file  Other Topics Concern   Not on file  Social History Narrative   Not on file   Social Drivers of Health   Financial Resource Strain: Not on file  Food Insecurity: Not on file  Transportation Needs: Not on file  Physical Activity: Not on file  Stress: Not on file  Social Connections: Not on file  Intimate Partner  Violence: Not on file    Family History  Problem Relation Age of Onset   Hypertension Mother    Diabetes Father    Breast cancer Neg Hx     Allergies  Allergen Reactions   Latex Other (See Comments)    Gets mouth rash from latex dental gloves. (Latex IgE - 08/25/16 - Negative)   Pseudoephedrine-Guaifenesin Other (See Comments)    Severe Headache   Other Rash    Walnuts cause mouth rash    Outpatient Medications Prior to Visit  Medication Sig   albuterol (VENTOLIN HFA) 108 (90 Base) MCG/ACT inhaler TAKE 2 PUFFS BY MOUTH 4 TIMES A DAY AS NEEDED   aspirin EC 81 MG tablet Take by mouth.   b complex vitamins capsule Take 1 capsule by mouth daily.   bimatoprost (LUMIGAN) 0.01 % SOLN Apply to eye.   Cetirizine HCl 10 MG  CAPS Take by mouth.   clotrimazole-betamethasone (LOTRISONE) cream APPLY TO AFFECTED AREA TWICE A DAY   FeFum-FePoly-FA-B Cmp-C-Biot (INTEGRA PLUS) CAPS Take 1 each by mouth daily.   Fluticasone-Salmeterol (ADVAIR DISKUS) 250-50 MCG/DOSE AEPB Inhale into the lungs.   furosemide (LASIX) 20 MG tablet TAKE 1 TABLET BY MOUTH EVERY DAY   Ibuprofen (ADVIL) 200 MG CAPS Take by mouth.   lansoprazole (PREVACID) 15 MG capsule TAKE 1 CAPSULE BY MOUTH TWICE DAILY 30 MINUTES BEFORE MEALS.   metFORMIN (GLUCOPHAGE-XR) 500 MG 24 hr tablet TAKE 1 TABLET BY MOUTH EVERY DAY   Polyethyl Glycol-Propyl Glycol (SYSTANE OP) Apply to eye as needed.   rosuvastatin (CRESTOR) 20 MG tablet TAKE 1 TABLET BY MOUTH EVERY DAY   SINGULAIR 10 MG tablet TAKE 1 TABLET BY MOUTH EVERY DAY   timolol (BETIMOL) 0.5 % ophthalmic solution Place 1 drop into both eyes 2 (two) times daily.   Triamcinolone Acetonide (TRIAMCINOLONE 0.1 % CREAM : EUCERIN) CREA Apply 1 Application topically 2 (two) times daily.   valsartan (DIOVAN) 80 MG tablet Take 2 tablets (160 mg total) by mouth daily.   VASCEPA 1 g capsule TAKE 2 CAPSULES BY MOUTH TWICE A DAY   Cholecalciferol (VITAMIN D3) 50000 units TABS Take by mouth once a week. (Patient not taking: Reported on 05/22/2023)   gabapentin (NEURONTIN) 300 MG capsule TAKE 1 CAPSULE BY MOUTH EVERYDAY AT BEDTIME (Patient not taking: Reported on 05/22/2023)   lansoprazole (PREVACID) 30 MG capsule daily. (Patient not taking: Reported on 01/20/2023)   No facility-administered medications prior to visit.    Review of Systems  Constitutional: Negative.  Negative for chills, diaphoresis, fever, malaise/fatigue and weight loss.  HENT: Negative.  Negative for sore throat.   Eyes: Negative.   Respiratory: Negative.  Negative for cough and shortness of breath.   Cardiovascular: Negative.  Negative for chest pain, palpitations and leg swelling.  Gastrointestinal: Negative.  Negative for abdominal pain, constipation,  diarrhea, heartburn, nausea and vomiting.  Genitourinary: Negative.  Negative for dysuria and flank pain.  Musculoskeletal:  Positive for joint pain. Negative for myalgias.  Skin: Negative.   Neurological: Negative.  Negative for dizziness and headaches.  Endo/Heme/Allergies: Negative.   Psychiatric/Behavioral: Negative.  Negative for depression and suicidal ideas. The patient is not nervous/anxious.        Objective:   BP (!) 144/80   Pulse 82   Ht 5\' 1"  (1.549 m)   Wt 197 lb 9.6 oz (89.6 kg)   SpO2 97%   BMI 37.34 kg/m   Vitals:   05/22/23 1257  BP: (!) 144/80  Pulse:  82  Height: 5\' 1"  (1.549 m)  Weight: 197 lb 9.6 oz (89.6 kg)  SpO2: 97%  BMI (Calculated): 37.36    Physical Exam Vitals and nursing note reviewed.  Constitutional:      Appearance: Normal appearance.  HENT:     Head: Normocephalic and atraumatic.     Nose: Nose normal.     Mouth/Throat:     Mouth: Mucous membranes are moist.     Pharynx: Oropharynx is clear.  Eyes:     Conjunctiva/sclera: Conjunctivae normal.     Pupils: Pupils are equal, round, and reactive to light.  Cardiovascular:     Rate and Rhythm: Normal rate and regular rhythm.     Pulses: Normal pulses.     Heart sounds: Normal heart sounds. No murmur heard. Pulmonary:     Effort: Pulmonary effort is normal.     Breath sounds: Normal breath sounds. No wheezing.  Abdominal:     General: Bowel sounds are normal.     Palpations: Abdomen is soft.     Tenderness: There is no abdominal tenderness. There is no right CVA tenderness or left CVA tenderness.  Musculoskeletal:        General: Normal range of motion.     Cervical back: Normal range of motion.     Right lower leg: Edema present.     Left lower leg: Edema present.  Skin:    General: Skin is warm and dry.  Neurological:     General: No focal deficit present.     Mental Status: She is alert and oriented to person, place, and time.  Psychiatric:        Mood and Affect: Mood  normal.        Behavior: Behavior normal.      No results found for any visits on 05/22/23.  No results found for this or any previous visit (from the past 2160 hours).    Assessment & Plan:  To continue current medications.  Encouraged to wear compression stockings.  Check labs today.  Mammogram order sent again. Problem List Items Addressed This Visit     OSA on CPAP   Type 2 diabetes mellitus without complication, without long-term current use of insulin (HCC)   Relevant Orders   Hemoglobin A1c   Gastroesophageal reflux disease without esophagitis   Stasis dermatitis of both legs   Lymphedema of lower extremity   Intertrigo   Hypertension associated with diabetes (HCC) - Primary   Relevant Orders   CMP14+EGFR   Combined hyperlipidemia associated with type 2 diabetes mellitus (HCC)   Relevant Orders   Lipid Profile   Other Visit Diagnoses       Breast cancer screening by mammogram       Relevant Orders   MM 3D SCREENING MAMMOGRAM BILATERAL BREAST     Left anterior shoulder pain       Relevant Orders   Arthritis Panel       Return in about 4 months (around 09/21/2023).   Total time spent: 30 minutes  Margaretann Loveless, MD  05/22/2023   This document may have been prepared by [facility] Voice Recognition software and as such may include unintentional dictation errors.

## 2023-05-23 LAB — CMP14+EGFR
ALT: 12 IU/L (ref 0–32)
AST: 21 IU/L (ref 0–40)
Albumin: 4.3 g/dL (ref 3.8–4.8)
Alkaline Phosphatase: 71 IU/L (ref 44–121)
BUN/Creatinine Ratio: 10 — ABNORMAL LOW (ref 12–28)
BUN: 11 mg/dL (ref 8–27)
Bilirubin Total: 0.3 mg/dL (ref 0.0–1.2)
CO2: 20 mmol/L (ref 20–29)
Calcium: 9.6 mg/dL (ref 8.7–10.3)
Chloride: 106 mmol/L (ref 96–106)
Creatinine, Ser: 1.09 mg/dL — ABNORMAL HIGH (ref 0.57–1.00)
Globulin, Total: 2.4 g/dL (ref 1.5–4.5)
Glucose: 85 mg/dL (ref 70–99)
Potassium: 4.7 mmol/L (ref 3.5–5.2)
Sodium: 144 mmol/L (ref 134–144)
Total Protein: 6.7 g/dL (ref 6.0–8.5)
eGFR: 53 mL/min/{1.73_m2} — ABNORMAL LOW (ref >59)

## 2023-05-23 LAB — LIPID PANEL
Chol/HDL Ratio: 2.3 ratio (ref 0.0–4.4)
Cholesterol, Total: 167 mg/dL (ref 100–199)
HDL: 74 mg/dL (ref >39)
LDL Chol Calc (NIH): 75 mg/dL (ref 0–99)
Triglycerides: 100 mg/dL (ref 0–149)
VLDL Cholesterol Cal: 18 mg/dL (ref 5–40)

## 2023-05-23 LAB — ARTHRITIS PANEL
Anti Nuclear Antibody (ANA): NEGATIVE
Rheumatoid fact SerPl-aCnc: <10 [IU]/mL (ref <14.0)
Sed Rate: 8 mm/h (ref 0–40)
Uric Acid: 5.5 mg/dL (ref 3.1–7.9)

## 2023-05-23 LAB — HEMOGLOBIN A1C
Est. average glucose Bld gHb Est-mCnc: 114 mg/dL
Hgb A1c MFr Bld: 5.6 % (ref 4.8–5.6)

## 2023-05-25 NOTE — Progress Notes (Signed)
 Patient notified

## 2023-06-26 ENCOUNTER — Telehealth: Payer: Self-pay | Admitting: Internal Medicine

## 2023-06-26 NOTE — Telephone Encounter (Signed)
 Patient left VM requesting a referral to the wound clinic for her legs. States that vein and vascular have not been helpful for her and that her legs are having something "strange" going on with them.   Need to call and see exactly what is going on with her legs now that is "strange".

## 2023-06-27 ENCOUNTER — Ambulatory Visit
Admission: RE | Admit: 2023-06-27 | Discharge: 2023-06-27 | Disposition: A | Discharge status: Home / Self Care | Source: Ambulatory Visit | Attending: Internal Medicine | Admitting: Internal Medicine

## 2023-06-27 DIAGNOSIS — Z1231 Encounter for screening mammogram for malignant neoplasm of breast: Secondary | ICD-10-CM | POA: Diagnosis not present

## 2023-06-29 DIAGNOSIS — H401121 Primary open-angle glaucoma, left eye, mild stage: Secondary | ICD-10-CM | POA: Diagnosis not present

## 2023-07-03 ENCOUNTER — Ambulatory Visit: Admitting: Internal Medicine

## 2023-07-03 ENCOUNTER — Encounter: Payer: Self-pay | Admitting: Internal Medicine

## 2023-07-03 VITALS — BP 130/70 | HR 62 | Ht 61.0 in | Wt 188.2 lb

## 2023-07-03 DIAGNOSIS — E1169 Type 2 diabetes mellitus with other specified complication: Secondary | ICD-10-CM

## 2023-07-03 DIAGNOSIS — I872 Venous insufficiency (chronic) (peripheral): Secondary | ICD-10-CM | POA: Diagnosis not present

## 2023-07-03 DIAGNOSIS — I152 Hypertension secondary to endocrine disorders: Secondary | ICD-10-CM | POA: Diagnosis not present

## 2023-07-03 DIAGNOSIS — I890 Lymphedema, not elsewhere classified: Secondary | ICD-10-CM | POA: Diagnosis not present

## 2023-07-03 DIAGNOSIS — E1159 Type 2 diabetes mellitus with other circulatory complications: Secondary | ICD-10-CM | POA: Diagnosis not present

## 2023-07-03 DIAGNOSIS — E782 Mixed hyperlipidemia: Secondary | ICD-10-CM | POA: Diagnosis not present

## 2023-07-03 DIAGNOSIS — G4733 Obstructive sleep apnea (adult) (pediatric): Secondary | ICD-10-CM | POA: Diagnosis not present

## 2023-07-03 NOTE — Progress Notes (Signed)
 Established Patient Office Visit  Subjective:  Patient ID: Judith Arnold, female    DOB: 1950/11/27  Age: 73 y.o. MRN: [id number]  Chief Complaint  Patient presents with   Follow-up    Wants wound clinic referral for legs    Patient comes in to address swelling of her legs due to lymphedema.  Patient has been evaluated by the vascular surgeon and recommended to wear her compression stockings regularly.  Patient has tried wearing them but they are not very comfortable and did not help much.  However today her right leg is slightly more swollen than the left but overall they are looking better.  She has very mild stasis dermatitis on both legs.  There is no open wound or ulcers.  Patient wanted to get information about the wound clinic, but since she does not have any open wounds they will not be able to help.  Will check on the lymphedema pumps and lymphedema clinic at the hospital.  Meanwhile she is advised to keep her legs elevated when possible and wear her compression stockings.    No other concerns at this time.   Past Medical History:  Diagnosis Date   Allergy    Arthritis    lower back, left shoulder   Asthma    Carpal tunnel syndrome on both sides    Dental bridge present    top   Dental crown present    multiple   Family history of adverse reaction to anesthesia    Father and sister - PONV   GERD (gastroesophageal reflux disease)    Glaucoma    Hypertension    Leaky heart valve    Pre-diabetes    Sleep apnea     Past Surgical History:  Procedure Laterality Date   CATARACT EXTRACTION W/PHACO Right 11/16/2016   Procedure: CATARACT EXTRACTION PHACO AND INTRAOCULAR LENS PLACEMENT (IOC)  right;  Surgeon: Annell Kidney, MD;  Location: [facility];  Service: Ophthalmology;  Laterality: Right;  pre diabetic latex sensitivity sleep apnea   CATARACT EXTRACTION W/PHACO Left 01/18/2017   Procedure: CATARACT EXTRACTION PHACO AND INTRAOCULAR LENS PLACEMENT  (IOC) LEFT DIABETIC;  Surgeon: Annell Kidney, MD;  Location: [facility];  Service: Ophthalmology;  Laterality: Left;   COLONOSCOPY     COLONOSCOPY WITH PROPOFOL  N/A 04/09/2018   Procedure: COLONOSCOPY WITH PROPOFOL ;  Surgeon: Cassie Click, MD;  Location: [facility] ENDOSCOPY;  Service: Endoscopy;  Laterality: N/A;   IRIDOTOMY / IRIDECTOMY Bilateral 2005   TOOTH EXTRACTION      Social History   Socioeconomic History   Marital status: Divorced    Spouse name: Not on file   Number of children: Not on file   Years of education: Not on file   Highest education level: Not on file  Occupational History   Not on file  Tobacco Use   Smoking status: Former    Current packs/day: 0.00    Types: Cigarettes    Quit date: 9    Years since quitting: 37.3   Smokeless tobacco: Never  Vaping Use   Vaping status: Never Used  Substance and Sexual Activity   Alcohol use: Yes    Alcohol/week: 0.0 standard drinks of alcohol    Comment: 5x/yr   Drug use: No   Sexual activity: Not on file  Other Topics Concern   Not on file  Social History Narrative   Not on file   Social Drivers of Health   Financial Resource Strain: Not on file  Food Insecurity: Not on file  Transportation Needs: Not on file  Physical Activity: Not on file  Stress: Not on file  Social Connections: Not on file  Intimate Partner Violence: Not on file    Family History  Problem Relation Age of Onset   Hypertension Mother    Diabetes Father    Breast cancer Neg Hx     Allergies  Allergen Reactions   Latex Other (See Comments)    Gets mouth rash from latex dental gloves. (Latex IgE - 08/25/16 - Negative)   Pseudoephedrine-Guaifenesin Other (See Comments)    Severe Headache   Other Rash    Walnuts cause mouth rash    Outpatient Medications Prior to Visit  Medication Sig   albuterol (VENTOLIN HFA) 108 (90 Base) MCG/ACT inhaler TAKE 2 PUFFS BY MOUTH 4 TIMES A DAY AS NEEDED   aspirin EC 81 MG  tablet Take by mouth.   b complex vitamins capsule Take 1 capsule by mouth daily.   bimatoprost (LUMIGAN) 0.01 % SOLN Apply to eye.   Cetirizine HCl 10 MG CAPS Take by mouth.   clotrimazole -betamethasone  (LOTRISONE ) cream APPLY TO AFFECTED AREA TWICE A DAY   FeFum-FePoly-FA-B Cmp-C-Biot (INTEGRA PLUS ) CAPS Take 1 each by mouth daily.   Fluticasone-Salmeterol (ADVAIR DISKUS) 250-50 MCG/DOSE AEPB Inhale into the lungs.   furosemide  (LASIX ) 20 MG tablet TAKE 1 TABLET BY MOUTH EVERY DAY   Ibuprofen (ADVIL) 200 MG CAPS Take by mouth.   lansoprazole (PREVACID) 15 MG capsule TAKE 1 CAPSULE BY MOUTH TWICE DAILY 30 MINUTES BEFORE MEALS.   metFORMIN (GLUCOPHAGE-XR) 500 MG 24 hr tablet TAKE 1 TABLET BY MOUTH EVERY DAY   Polyethyl Glycol-Propyl Glycol (SYSTANE OP) Apply to eye as needed.   rosuvastatin (CRESTOR) 20 MG tablet TAKE 1 TABLET BY MOUTH EVERY DAY   SINGULAIR 10 MG tablet TAKE 1 TABLET BY MOUTH EVERY DAY   timolol  (BETIMOL ) 0.5 % ophthalmic solution Place 1 drop into both eyes 2 (two) times daily.   Triamcinolone  Acetonide (TRIAMCINOLONE  0.1 % CREAM : EUCERIN) CREA Apply 1 Application topically 2 (two) times daily.   valsartan  (DIOVAN ) 80 MG tablet Take 2 tablets (160 mg total) by mouth daily.   VASCEPA 1 g capsule TAKE 2 CAPSULES BY MOUTH TWICE A DAY   Cholecalciferol (VITAMIN D3) 50000 units TABS Take by mouth once a week. (Patient not taking: Reported on 07/03/2023)   gabapentin (NEURONTIN) 300 MG capsule TAKE 1 CAPSULE BY MOUTH EVERYDAY AT BEDTIME (Patient not taking: Reported on 07/03/2023)   lansoprazole (PREVACID) 30 MG capsule daily. (Patient not taking: Reported on 07/03/2023)   No facility-administered medications prior to visit.    Review of Systems  Constitutional: Negative.  Negative for chills, fever, malaise/fatigue and weight loss.  HENT: Negative.  Negative for sore throat.   Eyes: Negative.   Respiratory: Negative.  Negative for cough and shortness of breath.    Cardiovascular: Negative.  Negative for chest pain, palpitations and leg swelling.  Gastrointestinal: Negative.  Negative for abdominal pain, constipation, diarrhea, heartburn, nausea and vomiting.  Genitourinary: Negative.  Negative for dysuria and flank pain.  Musculoskeletal: Negative.  Negative for joint pain and myalgias.  Skin: Negative.   Neurological: Negative.  Negative for dizziness and headaches.  Endo/Heme/Allergies: Negative.   Psychiatric/Behavioral: Negative.  Negative for depression and suicidal ideas. The patient is not nervous/anxious.        Objective:   BP 130/70   Pulse 62   Ht 5\' 1"  (1.549 m)  Wt 188 lb 3.2 oz (85.4 kg)   SpO2 97%   BMI 35.56 kg/m   Vitals:   07/03/23 1313  BP: 130/70  Pulse: 62  Height: 5\' 1"  (1.549 m)  Weight: 188 lb 3.2 oz (85.4 kg)  SpO2: 97%  BMI (Calculated): 35.58    Physical Exam Vitals and nursing note reviewed.  Constitutional:      Appearance: Normal appearance.  HENT:     Head: Normocephalic and atraumatic.     Nose: Nose normal.     Mouth/Throat:     Mouth: Mucous membranes are moist.     Pharynx: Oropharynx is clear.  Eyes:     Conjunctiva/sclera: Conjunctivae normal.     Pupils: Pupils are equal, round, and reactive to light.  Cardiovascular:     Rate and Rhythm: Normal rate and regular rhythm.     Pulses: Normal pulses.     Heart sounds: Normal heart sounds. No murmur heard. Pulmonary:     Effort: Pulmonary effort is normal.     Breath sounds: Normal breath sounds. No wheezing.  Abdominal:     General: Bowel sounds are normal.     Palpations: Abdomen is soft.     Tenderness: There is no abdominal tenderness. There is no right CVA tenderness or left CVA tenderness.  Musculoskeletal:        General: Normal range of motion.     Cervical back: Normal range of motion.     Right lower leg: Edema present.     Left lower leg: Edema present.  Skin:    General: Skin is warm and dry.  Neurological:      General: No focal deficit present.     Mental Status: She is alert and oriented to person, place, and time.  Psychiatric:        Mood and Affect: Mood normal.        Behavior: Behavior normal.      No results found for any visits on 07/03/23.  Recent Results (from the past 2160 hours)  CMP14+EGFR     Status: Abnormal   Collection Time: 05/22/23  1:39 PM  Result Value Ref Range   Glucose 85 70 - 99 mg/dL   BUN 11 8 - 27 mg/dL   Creatinine, Ser 0.98 (H) 0.57 - 1.00 mg/dL   eGFR 53 (L) >11 BJ/YNW/2.95   BUN/Creatinine Ratio 10 (L) 12 - 28   Sodium 144 134 - 144 mmol/L   Potassium 4.7 3.5 - 5.2 mmol/L   Chloride 106 96 - 106 mmol/L   CO2 20 20 - 29 mmol/L   Calcium 9.6 8.7 - 10.3 mg/dL   Total Protein 6.7 6.0 - 8.5 g/dL   Albumin 4.3 3.8 - 4.8 g/dL   Globulin, Total 2.4 1.5 - 4.5 g/dL   Bilirubin Total 0.3 0.0 - 1.2 mg/dL   Alkaline Phosphatase 71 44 - 121 IU/L   AST 21 0 - 40 IU/L   ALT 12 0 - 32 IU/L  Lipid Profile     Status: None   Collection Time: 05/22/23  1:39 PM  Result Value Ref Range   Cholesterol, Total 167 100 - 199 mg/dL   Triglycerides 621 0 - 149 mg/dL   HDL 74 >30 mg/dL   VLDL Cholesterol Cal 18 5 - 40 mg/dL   LDL Chol Calc (NIH) 75 0 - 99 mg/dL   Chol/HDL Ratio 2.3 0.0 - 4.4 ratio    Comment:  T. Chol/HDL Ratio                                             Men  Women                               1/2 Avg.Risk  3.4    3.3                                   Avg.Risk  5.0    4.4                                2X Avg.Risk  9.6    7.1                                3X Avg.Risk 23.4   11.0   Hemoglobin A1c     Status: None   Collection Time: 05/22/23  1:39 PM  Result Value Ref Range   Hgb A1c MFr Bld 5.6 4.8 - 5.6 %    Comment:          Prediabetes: 5.7 - 6.4          Diabetes: >6.4          Glycemic control for adults with diabetes: <7.0    Est. average glucose Bld gHb Est-mCnc 114 mg/dL  Arthritis Panel     Status: None    Collection Time: 05/22/23  1:39 PM  Result Value Ref Range   Uric Acid 5.5 3.1 - 7.9 mg/dL    Comment:            Therapeutic target for gout patients: <6.0   Anti Nuclear Antibody (ANA) Negative Negative   Rheumatoid fact SerPl-aCnc <10.0 <14.0 IU/mL   Sed Rate 8 0 - 40 mm/hr      Assessment & Plan:  Will try to get lymphedema pump for the patient to use at home.  Also consider lymphedema clinic at the hospital. Problem List Items Addressed This Visit     OSA on CPAP   Stasis dermatitis of both legs   Lymphedema of lower extremity - Primary   Hypertension associated with diabetes (HCC)   Combined hyperlipidemia associated with type 2 diabetes mellitus (HCC)    Follow up as scheduled.  Total time spent: 25 minutes  Aisha Hove, MD  07/03/2023   This document may have been prepared by [facility] Voice Recognition software and as such may include unintentional dictation errors.

## 2023-07-06 DIAGNOSIS — H401121 Primary open-angle glaucoma, left eye, mild stage: Secondary | ICD-10-CM | POA: Diagnosis not present

## 2023-07-06 DIAGNOSIS — H18523 Epithelial (juvenile) corneal dystrophy, bilateral: Secondary | ICD-10-CM | POA: Diagnosis not present

## 2023-07-06 DIAGNOSIS — H401112 Primary open-angle glaucoma, right eye, moderate stage: Secondary | ICD-10-CM | POA: Diagnosis not present

## 2023-07-07 ENCOUNTER — Telehealth: Payer: Self-pay | Admitting: Internal Medicine

## 2023-07-07 ENCOUNTER — Other Ambulatory Visit: Payer: Self-pay | Admitting: Internal Medicine

## 2023-07-07 DIAGNOSIS — I890 Lymphedema, not elsewhere classified: Secondary | ICD-10-CM

## 2023-07-07 DIAGNOSIS — I872 Venous insufficiency (chronic) (peripheral): Secondary | ICD-10-CM

## 2023-07-07 NOTE — Telephone Encounter (Signed)
 Patient left VM requesting a referral to the lymphedema clinic at [facility]. I checked and we cannot get a lymphedema pump through a DME company, she would need a referral to the lymphedema clinic. Please place referral.

## 2023-07-12 ENCOUNTER — Ambulatory Visit: Attending: Internal Medicine | Admitting: Occupational Therapy

## 2023-07-12 ENCOUNTER — Encounter: Payer: Self-pay | Admitting: Occupational Therapy

## 2023-07-12 DIAGNOSIS — I890 Lymphedema, not elsewhere classified: Secondary | ICD-10-CM | POA: Diagnosis not present

## 2023-07-12 NOTE — Therapy (Signed)
 OUTPATIENT OCCUPATIONAL THERAPY EVALUATION  BILATERAL LOWER EXTREMITY LYMPHEDEMA  Patient Name: AYVEN COUFAL MRN: [id number] DOB:05-17-1950, 73 y.o., female Today's Date: 07/12/2023  END OF SESSION:   OT End of Session - 07/12/23 1413     Visit Number 1    Number of Visits 36    Date for OT Re-Evaluation 10/10/23    OT Start Time 0205    OT Stop Time 0315    OT Time Calculation (min) 70 min    Activity Tolerance Patient tolerated treatment well;No increased pain;Other (comment)    Behavior During Therapy WFL for tasks assessed/performed   verbose; needs frequent redirection to complete eval             Past Medical History:  Diagnosis Date   Allergy    Arthritis    lower back, left shoulder   Asthma    Carpal tunnel syndrome on both sides    Dental bridge present    top   Dental crown present    multiple   Family history of adverse reaction to anesthesia    Father and sister - PONV   GERD (gastroesophageal reflux disease)    Glaucoma    Hypertension    Leaky heart valve    Pre-diabetes    Sleep apnea    Past Surgical History:  Procedure Laterality Date   CATARACT EXTRACTION W/PHACO Right 11/16/2016   Procedure: CATARACT EXTRACTION PHACO AND INTRAOCULAR LENS PLACEMENT (IOC)  right;  Surgeon: Annell Kidney, MD;  Location: [facility];  Service: Ophthalmology;  Laterality: Right;  pre diabetic latex sensitivity sleep apnea   CATARACT EXTRACTION W/PHACO Left 01/18/2017   Procedure: CATARACT EXTRACTION PHACO AND INTRAOCULAR LENS PLACEMENT (IOC) LEFT DIABETIC;  Surgeon: Annell Kidney, MD;  Location: [facility];  Service: Ophthalmology;  Laterality: Left;   COLONOSCOPY     COLONOSCOPY WITH PROPOFOL  N/A 04/09/2018   Procedure: COLONOSCOPY WITH PROPOFOL ;  Surgeon: Cassie Click, MD;  Location: [facility];  Service: Endoscopy;  Laterality: N/A;   IRIDOTOMY / IRIDECTOMY Bilateral 2005   TOOTH EXTRACTION     Patient Active  Problem List   Diagnosis Date Noted   Hypertension associated with diabetes (HCC) 01/20/2023   Combined hyperlipidemia associated with type 2 diabetes mellitus (HCC) 01/20/2023   Intertrigo 11/01/2022   Stasis dermatitis of both legs 10/21/2022   Leg swelling 10/21/2022   Lymphedema of lower extremity 10/21/2022   Type 2 diabetes mellitus without complication, without long-term current use of insulin (HCC) 05/24/2022   Mixed hyperlipidemia 05/24/2022   Essential hypertension, benign 05/24/2022   Absolute anemia 05/24/2022   Gastroesophageal reflux disease without esophagitis 05/24/2022   Allergic rhinitis 05/24/2022   Asthma, allergic, moderate persistent, uncomplicated 05/24/2022   Bilateral chronic angle-closure glaucoma, indeterminate stage 05/24/2022   Airway hyperreactivity 08/29/2013   OSA on CPAP 08/29/2013    PCP: Aisha Hove, MD  REFERRING PROVIDER: same  REFERRING DIAG: lymphedema I89.0  THERAPY DIAG:  Lymphedema, not elsewhere classified  Rationale for Evaluation and Treatment: Rehabilitation  ONSET DATE: Pt reports onset of L ankle / leg swelling after injury 20-30 yrs ago. Then about 1 year ago legs started swelling and Pt having prickly sensation on shins and thighs bilaterally.   SUBJECTIVE:  SUBJECTIVE STATEMENT:Jaunice Kable is referred to Occupational Therapy by Aisha Hove, MD,  for evaluation and treatment of BLE lymphedema.  Pt reports onset of L ankle and distal leg swelling about 30 years ago, then noticed RLE swelling and worsening LLE swelling about 1 year ago at same time she started having unusual sensation in legs. Pt denies previously having lymphedema treatment. Pt reports paternal aunt had leg swelling. Pt is unable to wear compression stockings because she is  unable to reach feet and distal legs to don and doff them due to back and hip pain.  PERTINENT HISTORY: Relevant to lymphedema (OSA (denies using CPAP), BLE stasis dermatitis, HTN, DM 2 (Pt states she is prediabetic only), B glaucoma, Back and R hip OA  PAIN:  Are you having pain? Yes: NPRS scale: not rated numerically Pain location: B legs Pain description: like "stickers" Aggravating factors: TBA Relieving factors: TBA  PRECAUTIONS: Fall and Other: LYMPHEDEMA precautions  (DM 2 and asthma)  RED FLAGS: None   WEIGHT BEARING RESTRICTIONS: No  FALLS:  Has patient fallen in last 6 months?  TBA  LIVING ENVIRONMENT: Lives with:  TBA Lives in: House/apartment Stairs: No;  Has following equipment at home: Environmental consultant - 4 wheeled  OCCUPATION: retired [employer] information [employer] manager  LEISURE: TBA  HAND DOMINANCE: right   PRIOR LEVEL OF FUNCTION: Independent with household mobility with device, Requires assistive device for independence, Needs assistance with ADLs, Needs assistance with homemaking, and Needs assistance with gait  PATIENT GOALS: reduce swelling and ikeep it fromn gettng worse    OBJECTIVE: Note: Objective measures were completed at Evaluation unless otherwise noted.  COGNITION:  Overall cognitive status:  impaired short term  memory    OBSERVATIONS / OTHER ASSESSMENTS: Mild, Stage II, BLE Lymphedema 2/2 suspected venous insufficiency.   SENSATION: Reports uncomtable scratching, or sticker-like  sensation on anterior thighs   POSTURE: Raised walker handles 2 notches to increase upright spinal alignment when walking to limit falls risk. Handles need to be raised at least 1 hole more for safe upright posture when walking.  Upright sitting posture: head forward , shoulders forward and rounded- appears to be flexible kyphosis  LE ROM: WFL for ankles and knees. Pt reports limited hip abduction sted)  LE MMT: wfl FOR LYMPHEDEMA CARE. Presenting with generalized weakness  and Pt reports debility  LYMPHEDEMA ASSESSMENTS:   SURGERY TYPE/DATE: Non-cancer related limb swelling  HX INFECTIONS: positive for 1 episode cellulitis treated w inbiotics'  Hx WOUNDS: denies   BLE COMPARATIVE LIMB VOLUMETRICS TBA OT Rx 1  LANDMARK RIGHT   R LEG (A-D) N/A  R THIGH (E-G) ml  R FULL LIMB (A-G) ml  Limb Volume differential (LVD)  %  Volume change since initial %  Volume change overall V  (Blank rows = not tested)  LANDMARK LEFT    L LEG (A-D) N/A  L THIGH (E-G) ml  L FULL LIMB (A-G) ml  Limb Volume differential (LVD)  %  Volume change since initial %  Volume change overall %    Mild, Stage  II, Bilateral Lower Extremity Lymphedema 2/2 CVI and Obesity  Skin  Description Hyper-Keratosis Peau' de Orange Shiny Tight Fibrotic/ Indurated Fatty Doughy Spongy/ boggy       R>L x  x   Skin dry Flaky WNL Macerated   mildly      Color Redness Varicosities Blanching Hemosiderin Stain Mottled   x     x   Odor Malodorous Yeast Fungal infection  WNL      x   Temperature Warm Cool wnl    x     Pitting Edema   1+ 2+ 3+ 4+ Non-pitting         x   Girth Symmetrical Asymmetrical                   Distribution    R>L toes to groin    Stemmer Sign Positive Negative   +    Lymphorrhea History Of:  Present Absent     x    Wounds History Of Present Absent Venous Arterial Pressure Sheer     x        Signs of Infection Redness Warmth Erythema Acute Swelling Drainage Borders                    Sensation Light Touch Deep pressure Hypersensitivty   Present Impaired Present Impaired Absent Impaired   x Tactile  x  x     Nails WNL   Fungus nail dystrophy   x     Hair Growth Symmetrical Asymmetrical   x    Skin Creases Base of toes  Ankles   Base of Fingers knees       Abdominal pannus Thigh Lobules  Face/neck   x x  x      (Blank rows = not tested)    GAIT: Distance walked: >500' Assistive device utilized: Environmental consultant - 4 wheeled Level  of assistance: Modified independence Comments: Pt bent at waist nearly 90 degrees with walker handles in lowest position  LYMPHEDEMA LIFE IMPACT SCALE (LLIS): TBA OT Rx visit 1 TREATMENT DATE:  OT Lymphedema evaluation Pt edu Modified walker handles by raising them ~ 3" to facilitate improved upright posture and to reduce fall risk  PATIENT EDUCATION:  Discussed differential diagnoses for various swelling disorders. Provided basic level education regarding lymphatic structure and function, etiology, onset patterns, stages of progression, and prevention to limit infection risk, worsening condition and further functional decline. Pt edu for aught interaction between blood circulatory system and lymphatic circulation.Discussed  impact of gravity and co-morbidities on lymphatic function. Outlined Complete Decongestive Therapy (CDT)  as standard of care and provided in depth information regarding 4 primary components of Intensive and Self Management Phases, including Manual Lymph Drainage (MLD), compression wrapping and garments, skin care, and therapeutic exercise. Samuel Crock discussion with re need for frequent attendance and high burden of care when caregiver is needed, impact of co morbidities. We discussed  the chronic, progressive nature of lymphedema and Importance of daily, ongoing LE self-care essential for limiting progression and infection risk.  Person educated: Patient  Education method: Explanation, Demonstration, and Handouts Education comprehension: verbalized understanding, returned demonstration, verbal cues required, and needs further education  HOME EXERCISE PROGRAM: BLE lymphatic pumping there ex using- 1 sets of 10 reps, each exercise in order-  1-2 x daily, bilaterally Simple self MLD 1 x daily Daily skin care to increase hydration, skin mobility and decrease infection risk- can be done during MLD During Intensive Phase CDT: Compression wraps 23/7 until limb volume reduction  complete During Self-management Phase CDT: Fit with qappropriate compression garments or alternatives. Consider BLE, knee length, Mediven, custom, CircAid , Velcro style leggings over soft cotton liners.  ASSESSMENT:  CLINICAL IMPRESSION: Kavitha "[name]" Richad Champagne is a 73 yo female presenting with chronic, progressive, BLE swelling 2/2 unknown etiology, which at first glance appears to be circlatory in nature due to skin coloring at distal legs  where swelling is most invested. Pt is presently unable to wear traditional elastic comression garments due to difficulty donning and doffing them.  Chronic, progressive lymphedema with associated skin changes, including fibrosis, limits this patent's functional performance in all occupational domains, including functional ambulation and mobility, basic and instrumental ADLs (lower body dressing, LB bathing, fitting street shoes and LB clothing, driving, shopping, and home management. It also limits perform productive activities, leisure pursuits, and participation in social and community activities. BLE lymphedema contributes to elevated infection risk. Pt will benefit from skilled OT for Complete Decongestive Therapy (CDT), which  typically includes manual lymphatic drainage (MLD), skin care to limit' infection risk and increase skin excursion, lymphatic pumping exercise, and during the Intensive Phase multilayer, gradient compression bandaging to reduce limb volume. Once limb volume is reduced as much as possible, Pt is fitted with aapprropriate compression garments and/ or devices andtransitions into the self management phase of care consisting of follow long support PRN.     Pt understands that her fair prognosis will become poor without daily assistance with compression wrapping since she is unable to reach feet and legs to apply them herself. Pt assures OT that she has a friend she believes is willing to assist her between OT sessions.   OBJECTIVE IMPAIRMENTS:  decreased standing and walking tolerance, activity tolerance, decreased balance, decreased knowledge of condition, decreased knowledge of use of DME, decreased mobility, decreased ROM, decreased strength, increased edema, impaired flexibility, impaired sensation, impaired UE functional use, impaired vision/preception, pain, and chronic, progressive, BLE swelling and associated skin changes, at increased infection risk   ACTIVITY LIMITATIONS: Functional ambulation and mobility (lifting, carrying, steps/stairs, transfers, squating) , Basic and instrumental ADLs, leisure pursuuits, productive activirties, social participation  PARTICIPATION LIMITATIONS: meal prep, cleaning, laundry, driving, shopping, yard work, and    PERSONAL FACTORS: Age, Fitness, Past/current experiences, Time since onset of injury/illness/exacerbation, 2+ comorbidities: OSA, Htn,  are also affecting patient's functional outcome.   REHAB POTENTIAL: Good  EVALUATION COMPLEXITY: Moderate   GOALS: Goals reviewed with patient? Yes  SHORT TERM GOALS: Target date: 4th OT Rx visit   Pt will demonstrate understanding of lymphedema precautions and prevention strategies with modified independence using a printed reference to identify at least 5 precautions and discussing how s/he may implement them into daily life to reduce risk of progression with extra time. Baseline:Max A Goal status: INITIAL  2.  Pt will be able to apply multilayer, knee length, gradient, compression wraps to one leg at a time from toes to below knee with max caregiver assist to decrease limb volume, to limit infection risk, and to limit lymphedema progression.  Baseline: Dependent Goal status: INITIAL  LONG TERM GOALS: Target date: 09/19/23  1.Given this patient's Intake score of TBA % on the Lymphedema Life Impact Scale (LLIS), patient will experience a reduction of at least 5 points in her perceived level of functional impairment resulting from lymphedema to  improve functional performance and quality of life (QOL). Baseline: TBA % Goal status: INITIAL  2.  Pt will achieve at least a 10% volume reduction in B legs to return limb to typical size and shape, to limit infection risk and LE progression, to decrease pain, to improve function. Baseline: Dependent Goal status: INITIAL  3.  Pt will obtain appropriate compression garments/devices and achieve modified independence (extra time + assistive devices) with donning/doffing to optimize limb volume reductions and limit LE progression over time. Baseline: Dependent Goal status: INITIAL  During Intensive phase CDT, with max  CG assistance, Pt will achieve at least 85% compliance with all adapted lymphedema self-care home program components, including daily skin care, compression wraps and /or garments, simple self MLD and lymphatic pumping therex to habituate LE self care protocol  into ADLs for optimal LE self-management over time. Baseline: Dependent Goal status: INITIAL  PLAN:  OT FREQUENCY: 2x/week  OT DURATION: 12 weeks  PLANNED INTERVENTIONS:compression bandaging, skin care,  97110-Therapeutic exercises, 97530- Therapeutic activity, 97535- Self Care, Manual lymph drainage, DME instructions, and fit with appropriate compression  PLAN FOR NEXT SESSION:  initial volumetrics. Pt edufor all aspects of LE self-care   Arnold Bicker, MS, OTR/L, CLT-LANA 07/12/23 3:41 PM

## 2023-07-19 ENCOUNTER — Encounter: Payer: Self-pay | Admitting: Occupational Therapy

## 2023-07-19 ENCOUNTER — Ambulatory Visit: Attending: Internal Medicine | Admitting: Occupational Therapy

## 2023-07-19 DIAGNOSIS — I890 Lymphedema, not elsewhere classified: Secondary | ICD-10-CM | POA: Diagnosis not present

## 2023-07-19 NOTE — Therapy (Signed)
 OUTPATIENT OCCUPATIONAL THERAPY TREATMENT NOTE  BILATERAL LOWER EXTREMITY LYMPHEDEMA  Patient Name: Judith Arnold MRN: [id number] DOB:[birth year], 73 y.o., female Today's Date: 07/19/2023  END OF SESSION:   OT End of Session - 07/19/23 0804     Visit Number 2    Number of Visits 36    Date for OT Re-Evaluation 10/10/23    OT Start Time 0804    OT Stop Time 0905    OT Time Calculation (min) 61 min    Equipment Utilized During Treatment sock donner    Activity Tolerance Patient tolerated treatment well;No increased pain;Other (comment)   Pt unable to reach distal legs and feet. Must have max assist to apply traditional compression wraps.   Behavior During Therapy [facility] for tasks assessed/performed   verbose; needs frequent redirection to complete eval             Past Medical History:  Diagnosis Date   Allergy    Arthritis    lower back, left shoulder   Asthma    Carpal tunnel syndrome on both sides    Dental bridge present    top   Dental crown present    multiple   Family history of adverse reaction to anesthesia    Father and sister - PONV   GERD (gastroesophageal reflux disease)    Glaucoma    Hypertension    Leaky heart valve    Pre-diabetes    Sleep apnea    Past Surgical History:  Procedure Laterality Date   CATARACT EXTRACTION W/PHACO Right 11/16/2016   Procedure: CATARACT EXTRACTION PHACO AND INTRAOCULAR LENS PLACEMENT (IOC)  right;  Surgeon: Annell Kidney, MD;  Location: [facility];  Service: Ophthalmology;  Laterality: Right;  pre diabetic latex sensitivity sleep apnea   CATARACT EXTRACTION W/PHACO Left 01/18/2017   Procedure: CATARACT EXTRACTION PHACO AND INTRAOCULAR LENS PLACEMENT (IOC) LEFT DIABETIC;  Surgeon: Annell Kidney, MD;  Location: [facility];  Service: Ophthalmology;  Laterality: Left;   COLONOSCOPY     COLONOSCOPY WITH PROPOFOL  N/A 04/09/2018   Procedure: COLONOSCOPY WITH PROPOFOL ;  Surgeon: Cassie Click,  MD;  Location: [facility];  Service: Endoscopy;  Laterality: N/A;   IRIDOTOMY / IRIDECTOMY Bilateral 2005   TOOTH EXTRACTION     Patient Active Problem List   Diagnosis Date Noted   Hypertension associated with diabetes (HCC) 01/20/2023   Combined hyperlipidemia associated with type 2 diabetes mellitus (HCC) 01/20/2023   Intertrigo 11/01/2022   Stasis dermatitis of both legs 10/21/2022   Leg swelling 10/21/2022   Lymphedema of lower extremity 10/21/2022   Type 2 diabetes mellitus without complication, without long-term current use of insulin (HCC) 05/24/2022   Mixed hyperlipidemia 05/24/2022   Essential hypertension, benign 05/24/2022   Absolute anemia 05/24/2022   Gastroesophageal reflux disease without esophagitis 05/24/2022   Allergic rhinitis 05/24/2022   Asthma, allergic, moderate persistent, uncomplicated 05/24/2022   Bilateral chronic angle-closure glaucoma, indeterminate stage 05/24/2022   Airway hyperreactivity 08/29/2013   OSA on CPAP 08/29/2013    PCP: Aisha Hove, MD  REFERRING PROVIDER: same  REFERRING DIAG: lymphedema I89.0  THERAPY DIAG:  Lymphedema, not elsewhere classified  Rationale for Evaluation and Treatment: Rehabilitation  ONSET DATE: Pt reports onset of L ankle / leg swelling after injury 20-30 yrs ago. Then about 1 year ago legs started swelling and Pt having prickly sensation on shins and thighs bilaterally.   SUBJECTIVE:  SUBJECTIVE STATEMENT: "Judith Kehr" Arnold presents to OT for treatment of BLE lymphedema 2/2 suspected CVI and obesity. Pt is unaccompanied today. She reports that the friend who she hoped would be able to assist her with compression wrapping between visits is not available. At present Pt does not have assistance with self care. Pt brings lace up  tennis shoes to treatment visit as instructed. Pt is eager to commence CDT today. She decides to start Rx on thew LLE because it is the most painful.    (INITIAL EVAL 07/12/23 : Judith Arnold is referred to Occupational Therapy by Aisha Hove, MD,  for evaluation and treatment of BLE lymphedema.  Pt reports onset of L ankle and distal leg swelling about 30 years ago, then noticed RLE swelling and worsening LLE swelling about 1 year ago at same time she started having unusual sensation in legs. Pt denies previously having lymphedema treatment. Pt reports paternal aunt had leg swelling. Pt is unable to wear compression stockings because she is unable to reach feet and distal legs to don and doff them due to back and hip pain.)  PERTINENT HISTORY: Relevant to lymphedema (OSA (denies using CPAP), BLE stasis dermatitis, HTN, DM 2 (Pt states she is prediabetic only), B glaucoma, Back and R hip OA  PAIN:  Are you having pain? Yes: NPRS scale: not rated numerically Pain location: B legs Pain description: like "stickers" Aggravating factors: standing, walking, dependent sitting Relieving factors: elevation  PRECAUTIONS: Fall and Other: LYMPHEDEMA precautions  (DM 2 and asthma)  WEIGHT BEARING RESTRICTIONS: No  FALLS:  Has patient fallen in last 6 months?  TBA  LIVING ENVIRONMENT: Lives with:  TBA Lives in: House/apartment Stairs: No;  Has following equipment at home: Environmental consultant - 4 wheeled  OCCUPATION: retired Scientist, research (life sciences)  LEISURE: reading and taking notes on current events and politics  HAND DOMINANCE: right   PRIOR LEVEL OF FUNCTION: Independent with household mobility with device, Requires assistive device for independence, Needs assistance with ADLs, Needs assistance with homemaking, and Needs assistance with gait  PATIENT GOALS: reduce swelling and keep it from getting worse  OBJECTIVE: Note: Objective measures were completed at Evaluation unless otherwise  noted.  COGNITION:  Overall cognitive status:  impaired short term  memory    OBSERVATIONS / OTHER ASSESSMENTS: Mild, Stage II, BLE Lymphedema 2/2 suspected venous insufficiency.   SENSATION: Reports uncomfortable scratching, or sticker-like  sensation on anterior thighs   POSTURE: Raised walker handles 2 notches to increase upright spinal alignment when walking to limit falls risk. Handles need to be raised at least 1 hole more for safe upright posture when walking.  Upright sitting posture: head forward , shoulders forward and rounded- appears to be flexible kyphosis  LE ROM: WFL for ankles and knees. Pt reports limited hip abduction sted)  LE MMT: WFL FOR LYMPHEDEMA CARE. Presenting with generalized weakness and Pt reports debility  LYMPHEDEMA ASSESSMENTS:   SURGERY TYPE/DATE: Non-cancer related limb swelling  HX INFECTIONS: positive for 1 episode cellulitis treated w antibiotics  Hx WOUNDS: denies   BLE COMPARATIVE LIMB VOLUMETRICS: INITIAL 07/19/23  LANDMARK RIGHT (dominant)  R LEG (A-D) 3525.1 ml  R THIGH (E-G) ml  R FULL LIMB (A-G) ml  Limb Volume differential (LVD)  %  Volume change since initial %  Volume change overall V  (Blank rows = not tested)  LANDMARK LEFT    L LEG (A-D) 3769.6 ml  L THIGH (E-G) ml  L FULL LIMB (A-G) ml  Limb Volume differential (LVD)  Limb Volume Differential (LVD) measures 6.5%, L>R.  Volume change since initial %  Volume change overall %    Mild, Stage  II, Bilateral Lower Extremity Lymphedema 2/2 CVI and Obesity  Skin  Description Hyper-Keratosis Peau' de Orange Shiny Tight Fibrotic/ Indurated Fatty Doughy Spongy/ boggy       R>L x  x   Skin dry Flaky WNL Macerated   mildly      Color Redness Varicosities Blanching Hemosiderin Stain Mottled   x     x   Odor Malodorous Yeast Fungal infection  WNL      x   Temperature Warm Cool wnl    x     Pitting Edema   1+ 2+ 3+ 4+ Non-pitting         x   Girth Symmetrical  Asymmetrical                   Distribution    R>L toes to groin    Stemmer Sign Positive Negative   +    Lymphorrhea History Of:  Present Absent     x    Wounds History Of Present Absent Venous Arterial Pressure Sheer     x        Signs of Infection Redness Warmth Erythema Acute Swelling Drainage Borders                    Sensation Light Touch Deep pressure Hypersensitivity   Present Impaired Present Impaired Absent Impaired   x Tactile  x  x     Nails WNL   Fungus nail dystrophy   x     Hair Growth Symmetrical Asymmetrical   x    Skin Creases Base of toes  Ankles   Base of Fingers knees       Abdominal pannus Thigh Lobules  Face/neck   x x  x      (Blank rows = not tested)    GAIT: Distance walked: >500' Assistive device utilized: Environmental consultant - 4 wheeled Level of assistance: Modified independence Comments: Pt bent at waist nearly 90 degrees with walker handles in lowest position  LYMPHEDEMA LIFE IMPACT SCALE (LLIS): TBA OT Rx visit 1  TREATMENT DATE:  BLE comparative limb volumetrics RLE multilayer compression bandaging Pt edu for lymphedema self-care- compression wrapping and volumetrics   PATIENT EDUCATION:  Continued Pt/ CG edu for lymphedema self care home program throughout session. Topics include outcome of comparative limb volumetrics- starting limb volume differentials (LVDs), technology and gradient techniques used for short stretch, multilayer compression wrapping, simple self-MLD, therapeutic lymphatic pumping exercises, skin/nail care, LE precautions, compression garment recommendations and specifications, wear and care schedule and compression garment donning / doffing w assistive devices. Discussed progress towards all OT goals since commencing CDT. Discussed detrimental impact of obesity on lower and upper extremity lymphedema over time. Reviewed OT goals for lymphedema care with Pt and discussed progress to date.  All questions answered to the  Pt's satisfaction. Good return. Person educated: Patient  Education method: Explanation, Demonstration, and Handouts Education comprehension: verbalized understanding, returned demonstration, verbal cues required, and needs further education   HOME EXERCISE PROGRAM: BLE lymphatic pumping there ex using- 1 sets of 10 reps, each exercise in order-  1-2 x daily, bilaterally Simple self MLD 1 x daily Daily skin care to increase hydration, skin mobility and decrease infection risk- can be done during MLD During Intensive Phase CDT: Compression wraps 23/7 until limb  volume reduction complete During Self-management Phase CDT: Fit with appropriate compression garments or alternatives. Consider BLE, knee length, Mediven, custom, CircAid , Velcro style leggings over soft cotton liners.  ASSESSMENT: CLINICAL IMPRESSION:Initial limb volumetrics reveal Limb Volume Differential (LVD) measuring 6.5%, L>R. Despite LLE being mildly larger, the R leg is most symptomatic for leg pain and stasis dermatitis. Applied multilayer short retch compression wraps from base of the toes to popliteal fossa of R Leg utilizing one each 10 and 12 cm wide  short stretch wraps over a single layer of Rosidal   foam (0.4 cm thick). Provided Pt edu throughout session. Pt was able to use sock donner to don socks after wrapping. After modifying shoe laces Pt able to done B tennis shoes without assistive device. Pt instructed to remove wraps if she experiences sudden increased pain in her leg, or atypical SOB. If Pt is unable to line up assistance with compression wrapping between visits, then we'll change plan and opt for garments ASAP instead of full Intensive Phase CDT. Cont as per POC.   (INITIAL EVAL 07/12/23: Delone "Judith Kehr" Richad Champagne is a 73 yo female presenting with chronic, progressive, BLE swelling 2/2 unknown etiology, which at first glance appears to be circulatory in nature due to skin coloring at distal legs where swelling is most  invested. Pt is presently unable to wear traditional elastic compression garments due to difficulty donning and doffing them.  Chronic, progressive lymphedema with associated skin changes, including fibrosis, limits this patent's functional performance in all occupational domains, including functional ambulation and mobility, basic and instrumental ADLs (lower body dressing, LB bathing, fitting street shoes and LB clothing, driving, shopping, and home management. It also limits perform productive activities, leisure pursuits, and participation in social and community activities. BLE lymphedema contributes to elevated infection risk. Pt will benefit from skilled OT for Complete Decongestive Therapy (CDT), which  typically includes manual lymphatic drainage (MLD), skin care to limit' infection risk and increase skin excursion, lymphatic pumping exercise, and during the Intensive Phase multilayer, gradient compression bandaging to reduce limb volume. Once limb volume is reduced as much as possible, Pt is fitted with appropriate compression garments and/ or devices and transitions into the self management phase of care consisting of follow long support PRN.    Pt understands that her fair prognosis will become poor without daily assistance with compression wrapping since she is unable to reach feet and legs to apply them herself. Pt assures OT that she has a friend she believes is willing to assist her between OT sessions. )   OBJECTIVE IMPAIRMENTS: decreased standing and walking tolerance, activity tolerance, decreased balance, decreased knowledge of condition, decreased knowledge of use of DME, decreased mobility, decreased ROM, decreased strength, increased edema, impaired flexibility, impaired sensation, impaired UE functional use, impaired vision/reception, pain, and chronic, progressive, BLE swelling and associated skin changes, at increased infection risk   ACTIVITY LIMITATIONS: Functional ambulation and  mobility (lifting, carrying, steps/stairs, transfers, squatting) , Basic and instrumental ADLs, leisure pursuits, productive activities, social participation  PARTICIPATION LIMITATIONS: meal prep, cleaning, laundry, driving, shopping, yard work, and    PERSONAL FACTORS: Age, Fitness, Past/current experiences, Time since onset of injury/illness/exacerbation, 2+ co morbidities: OSA, Htn,  are also affecting patient's functional outcome.   REHAB POTENTIAL: Good  EVALUATION COMPLEXITY: Moderate   GOALS: Goals reviewed with patient? Yes  SHORT TERM GOALS: Target date: 4th OT Rx visit   Pt will demonstrate understanding of lymphedema precautions and prevention strategies with modified independence using a  printed reference to identify at least 5 precautions and discussing how s/he may implement them into daily life to reduce risk of progression with extra time. Baseline:Max A Goal status: INITIAL  2.  Pt will be able to apply multilayer, knee length, gradient, compression wraps to one leg at a time from toes to below knee with max caregiver assist to decrease limb volume, to limit infection risk, and to limit lymphedema progression.  Baseline: Dependent Goal status: INITIAL  LONG TERM GOALS: Target date: 09/19/23  1.Given this patient's Intake score of TBA % on the Lymphedema Life Impact Scale (LLIS), patient will experience a reduction of at least 5 points in her perceived level of functional impairment resulting from lymphedema to improve functional performance and quality of life (QOL). Baseline: TBA % Goal status: INITIAL  2.  Pt will achieve at least a 10% volume reduction in B legs to return limb to typical size and shape, to limit infection risk and LE progression, to decrease pain, to improve function. Baseline: Dependent Goal status: INITIAL  3.  Pt will obtain appropriate compression garments/devices and achieve modified independence (extra time + assistive devices) with  donning/doffing to optimize limb volume reductions and limit LE progression over time. Baseline: Dependent Goal status: INITIAL  During Intensive phase CDT, with max CG assistance, Pt will achieve at least 85% compliance with all adapted lymphedema self-care home program components, including daily skin care, compression wraps and /or garments, simple self MLD and lymphatic pumping therex to habituate LE self care protocol  into ADLs for optimal LE self-management over time. Baseline: Dependent Goal status: INITIAL  PLAN:  OT FREQUENCY: 2x/week  OT DURATION: 12 weeks  PLANNED INTERVENTIONS:compression bandaging, skin care,  97110-Therapeutic exercises, 97530- Therapeutic activity, 97535- Self Care, Manual lymph drainage, DME instructions, and fit with appropriate compression  PLAN FOR NEXT SESSION:  initial volumetrics. Pt edu for all aspects of LE self-care   Arnold Bicker, MS, OTR/L, CLT-LANA 07/19/23 9:20 AM

## 2023-07-21 ENCOUNTER — Ambulatory Visit: Admitting: Occupational Therapy

## 2023-07-21 DIAGNOSIS — I890 Lymphedema, not elsewhere classified: Secondary | ICD-10-CM

## 2023-07-21 NOTE — Therapy (Signed)
 OUTPATIENT OCCUPATIONAL THERAPY TREATMENT NOTE  BILATERAL LOWER EXTREMITY LYMPHEDEMA  Patient Name: Judith Arnold MRN: [id number] DOB:[birth year], 73 y.o., female Today's Date: 07/21/2023  END OF SESSION:   OT End of Session - 07/21/23 1108     Visit Number 3    Number of Visits 36    Date for OT Re-Evaluation 10/10/23    OT Start Time 1100    OT Stop Time 1154    OT Time Calculation (min) 54 min    Equipment Utilized During Treatment sock donner    Activity Tolerance Patient tolerated treatment well;No increased pain;Other (comment)   Pt unable to reach distal legs and feet. Must have max assist to apply traditional compression wraps.   Behavior During Therapy [facility] for tasks assessed/performed   verbose; needs frequent redirection to complete eval             Past Medical History:  Diagnosis Date   Allergy    Arthritis    lower back, left shoulder   Asthma    Carpal tunnel syndrome on both sides    Dental bridge present    top   Dental crown present    multiple   Family history of adverse reaction to anesthesia    Father and sister - PONV   GERD (gastroesophageal reflux disease)    Glaucoma    Hypertension    Leaky heart valve    Pre-diabetes    Sleep apnea    Past Surgical History:  Procedure Laterality Date   CATARACT EXTRACTION W/PHACO Right 11/16/2016   Procedure: CATARACT EXTRACTION PHACO AND INTRAOCULAR LENS PLACEMENT (IOC)  right;  Surgeon: Annell Kidney, MD;  Location: [facility];  Service: Ophthalmology;  Laterality: Right;  pre diabetic latex sensitivity sleep apnea   CATARACT EXTRACTION W/PHACO Left 01/18/2017   Procedure: CATARACT EXTRACTION PHACO AND INTRAOCULAR LENS PLACEMENT (IOC) LEFT DIABETIC;  Surgeon: Annell Kidney, MD;  Location: [facility];  Service: Ophthalmology;  Laterality: Left;   COLONOSCOPY     COLONOSCOPY WITH PROPOFOL  N/A 04/09/2018   Procedure: COLONOSCOPY WITH PROPOFOL ;  Surgeon: Cassie Click,  MD;  Location: [facility];  Service: Endoscopy;  Laterality: N/A;   IRIDOTOMY / IRIDECTOMY Bilateral 2005   TOOTH EXTRACTION     Patient Active Problem List   Diagnosis Date Noted   Hypertension associated with diabetes (HCC) 01/20/2023   Combined hyperlipidemia associated with type 2 diabetes mellitus (HCC) 01/20/2023   Intertrigo 11/01/2022   Stasis dermatitis of both legs 10/21/2022   Leg swelling 10/21/2022   Lymphedema of lower extremity 10/21/2022   Type 2 diabetes mellitus without complication, without long-term current use of insulin (HCC) 05/24/2022   Mixed hyperlipidemia 05/24/2022   Essential hypertension, benign 05/24/2022   Absolute anemia 05/24/2022   Gastroesophageal reflux disease without esophagitis 05/24/2022   Allergic rhinitis 05/24/2022   Asthma, allergic, moderate persistent, uncomplicated 05/24/2022   Bilateral chronic angle-closure glaucoma, indeterminate stage 05/24/2022   Airway hyperreactivity 08/29/2013   OSA on CPAP 08/29/2013    PCP: Judith Hove, MD  REFERRING PROVIDER: same  REFERRING DIAG: lymphedema I89.0  THERAPY DIAG:  Lymphedema, not elsewhere classified  Rationale for Evaluation and Treatment: Rehabilitation  ONSET DATE: Pt reports onset of L ankle / leg swelling after injury 20-30 yrs ago. Then about 1 year ago legs started swelling and Pt having prickly sensation on shins and thighs bilaterally.   SUBJECTIVE:  SUBJECTIVE STATEMENT: "Judith Kehr" Arnold presents to OT for treatment of BLE lymphedema 2/2 suspected CVI and obesity. Pt is unaccompanied today. Pt presents wearing compression wraps applied at last visit. Pt did not realize that she was supposed to remove the wraps after 24 hours. So, consequently, We agree that she tolerated them without any  difficulty. Pt denies LE related pain in her legs.   (INITIAL EVAL 07/12/23 : Judith Arnold is referred to Occupational Therapy by Judith Hove, MD,  for evaluation and treatment of BLE lymphedema.  Pt reports onset of L ankle and distal leg swelling about 30 years ago, then noticed RLE swelling and worsening LLE swelling about 1 year ago at same time she started having unusual sensation in legs. Pt denies previously having lymphedema treatment. Pt reports paternal aunt had leg swelling. Pt is unable to wear compression stockings because she is unable to reach feet and distal legs to don and doff them due to back and hip pain.)  PERTINENT HISTORY: Relevant to lymphedema (OSA (denies using CPAP), BLE stasis dermatitis, HTN, DM 2 (Pt states she is prediabetic only), B glaucoma, Back and R hip OA  PAIN:  Are you having pain? Yes: NPRS scale: not rated numerically Pain location: B legs Pain description: like "stickers" Aggravating factors: standing, walking, dependent sitting Relieving factors: elevation  PRECAUTIONS: Fall and Other: LYMPHEDEMA precautions  (DM 2 and asthma)  WEIGHT BEARING RESTRICTIONS: No  FALLS:  Has patient fallen in last 6 months? TBA  LIVING ENVIRONMENT: Lives with: TBA Lives in: House/apartment Stairs: No;  Has following equipment at home: Environmental consultant - 4 wheeled  OCCUPATION: retired Scientist, research (life sciences)  LEISURE: reading and taking notes on current events and politics  HAND DOMINANCE: right   PRIOR LEVEL OF FUNCTION: Independent with household mobility with device, Requires assistive device for independence, Needs assistance with ADLs, Needs assistance with homemaking, and Needs assistance with gait  PATIENT GOALS: reduce swelling and keep it from getting worse  OBJECTIVE: Note: Objective measures were completed at Evaluation unless otherwise noted.  COGNITION:  Overall cognitive status: impaired short term  memory   OBSERVATIONS / OTHER  ASSESSMENTS: Mild, Stage II, BLE Lymphedema 2/2 suspected venous insufficiency.   SENSATION: Reports uncomfortable scratching, or sticker-like  sensation on anterior thighs   POSTURE: Raised walker handles 2 notches to increase upright spinal alignment when walking to limit falls risk. Handles need to be raised at least 1 hole more for safe upright posture when walking.  Upright sitting posture: head forward , shoulders forward and rounded- appears to be flexible kyphosis  LE ROM: WFL for ankles and knees. Pt reports limited hip abduction sted)  LE MMT: WFL FOR LYMPHEDEMA CARE. Presenting with generalized weakness and Pt reports debility  LYMPHEDEMA ASSESSMENTS:   SURGERY TYPE/DATE: Non-cancer related limb swelling  HX INFECTIONS: positive for 1 episode cellulitis treated w antibiotics  Hx WOUNDS: denies   BLE COMPARATIVE LIMB VOLUMETRICS: INITIAL 07/19/23  LANDMARK RIGHT (dominant)  R LEG (A-D) 3525.1 ml  R THIGH (E-G) ml  R FULL LIMB (A-G) ml  Limb Volume differential (LVD)  %  Volume change since initial %  Volume change overall V  (Blank rows = not tested)  LANDMARK LEFT    L LEG (A-D) 3769.6 ml  L THIGH (E-G) ml  L FULL LIMB (A-G) ml  Limb Volume differential (LVD)  Limb Volume Differential (LVD) measures 6.5%, L>R.  Volume change since initial %  Volume change overall %  Mild, Stage  II, Bilateral Lower Extremity Lymphedema 2/2 CVI and Obesity  Skin  Description Hyper-Keratosis Peau' de Orange Shiny Tight Fibrotic/ Indurated Fatty Doughy Spongy/ boggy       R>L x  x   Skin dry Flaky WNL Macerated   mildly      Color Redness Varicosities Blanching Hemosiderin Stain Mottled   x     x   Odor Malodorous Yeast Fungal infection  WNL      x   Temperature Warm Cool wnl    x     Pitting Edema   1+ 2+ 3+ 4+ Non-pitting         x   Girth Symmetrical Asymmetrical                   Distribution    R>L toes to groin    Stemmer Sign Positive Negative    +    Lymphorrhea History Of:  Present Absent     x    Wounds History Of Present Absent Venous Arterial Pressure Sheer     x        Signs of Infection Redness Warmth Erythema Acute Swelling Drainage Borders                    Sensation Light Touch Deep pressure Hypersensitivity   Present Impaired Present Impaired Absent Impaired   x Tactile  x  x     Nails WNL   Fungus nail dystrophy   x     Hair Growth Symmetrical Asymmetrical   x    Skin Creases Base of toes  Ankles   Base of Fingers knees       Abdominal pannus Thigh Lobules  Face/neck   x x  x      (Blank rows = not tested)    GAIT: Distance walked: >500' Assistive device utilized: Environmental consultant - 4 wheeled Level of assistance: Modified independence Comments: Pt bent at waist nearly 90 degrees with walker handles in lowest position  LYMPHEDEMA LIFE IMPACT SCALE (LLIS): TBA OT Rx visit 1  TREATMENT DATE:  RLE multilayer compression bandaging Pt edu for lymphedema self-care- compression wrapping    PATIENT EDUCATION:  Continued Pt/ CG edu for lymphedema self care home program throughout session. Topics include outcome of comparative limb volumetrics- starting limb volume differentials (LVDs), technology and gradient techniques used for short stretch, multilayer compression wrapping, simple self-MLD, therapeutic lymphatic pumping exercises, skin/nail care, LE precautions, compression garment recommendations and specifications, wear and care schedule and compression garment donning / doffing w assistive devices. Discussed progress towards all OT goals since commencing CDT. Discussed detrimental impact of obesity on lower and upper extremity lymphedema over time. Reviewed OT goals for lymphedema care with Pt and discussed progress to date.  All questions answered to the Pt's satisfaction. Good return. Person educated: Patient  Education method: Explanation, Demonstration, and Handouts Education comprehension:  verbalized understanding, returned demonstration, verbal cues required, and needs further education   HOME EXERCISE PROGRAM: BLE lymphatic pumping there ex using- 1 sets of 10 reps, each exercise in order-  1-2 x daily, bilaterally Simple self MLD 1 x daily Daily skin care to increase hydration, skin mobility and decrease infection risk- can be done during MLD During Intensive Phase CDT: Compression wraps 23/7 until limb volume reduction complete During Self-management Phase CDT: Fit with appropriate compression garments or alternatives. Consider BLE, knee length, Mediven, custom, CircAid , Velcro style leggings over soft cotton liners.  ASSESSMENT:  CLINICAL IMPRESSION: Entire visit dedicated to teaching Pt how to apply multilayer compression wraps using gradient techniques. Pt took notes throughout session. We discussed again the importance of having assistance with compression wrapping between sessions since Pt is unable to perform this critical componenet of CDT herself. Pt will contact the person she hopes will assist her this weekend and ask her to attend an OT visit to learn. Pt agrees to remove the wraps applied today in 24 hours. Cont as per POC.   (INITIAL EVAL 07/12/23: Brittinay "Judith Kehr" Richad Champagne is a 73 yo female presenting with chronic, progressive, BLE swelling 2/2 unknown etiology, which at first glance appears to be circulatory in nature due to skin coloring at distal legs where swelling is most invested. Pt is presently unable to wear traditional elastic compression garments due to difficulty donning and doffing them.  Chronic, progressive lymphedema with associated skin changes, including fibrosis, limits this patent's functional performance in all occupational domains, including functional ambulation and mobility, basic and instrumental ADLs (lower body dressing, LB bathing, fitting street shoes and LB clothing, driving, shopping, and home management. It also limits perform productive  activities, leisure pursuits, and participation in social and community activities. BLE lymphedema contributes to elevated infection risk. Pt will benefit from skilled OT for Complete Decongestive Therapy (CDT), which  typically includes manual lymphatic drainage (MLD), skin care to limit' infection risk and increase skin excursion, lymphatic pumping exercise, and during the Intensive Phase multilayer, gradient compression bandaging to reduce limb volume. Once limb volume is reduced as much as possible, Pt is fitted with appropriate compression garments and/ or devices and transitions into the self management phase of care consisting of follow long support PRN.    Pt understands that her fair prognosis will become poor without daily assistance with compression wrapping since she is unable to reach feet and legs to apply them herself. Pt assures OT that she has a friend she believes is willing to assist her between OT sessions. )   OBJECTIVE IMPAIRMENTS: decreased standing and walking tolerance, activity tolerance, decreased balance, decreased knowledge of condition, decreased knowledge of use of DME, decreased mobility, decreased ROM, decreased strength, increased edema, impaired flexibility, impaired sensation, impaired UE functional use, impaired vision/reception, pain, and chronic, progressive, BLE swelling and associated skin changes, at increased infection risk   ACTIVITY LIMITATIONS: Functional ambulation and mobility (lifting, carrying, steps/stairs, transfers, squatting) , Basic and instrumental ADLs, leisure pursuits, productive activities, social participation  PARTICIPATION LIMITATIONS: meal prep, cleaning, laundry, driving, shopping, yard work, and    PERSONAL FACTORS: Age, Fitness, Past/current experiences, Time since onset of injury/illness/exacerbation, 2+ co morbidities: OSA, Htn,  are also affecting patient's functional outcome.   REHAB POTENTIAL: Good  EVALUATION COMPLEXITY:  Moderate   GOALS: Goals reviewed with patient? Yes  SHORT TERM GOALS: Target date: 4th OT Rx visit   Pt will demonstrate understanding of lymphedema precautions and prevention strategies with modified independence using a printed reference to identify at least 5 precautions and discussing how s/he may implement them into daily life to reduce risk of progression with extra time. Baseline:Max A Goal status: INITIAL  2.  Pt will be able to apply multilayer, knee length, gradient, compression wraps to one leg at a time from toes to below knee with max caregiver assist to decrease limb volume, to limit infection risk, and to limit lymphedema progression.  Baseline: Dependent Goal status: INITIAL  LONG TERM GOALS: Target date: 09/19/23  1.Given this patient's Intake score of TBA % on  the Lymphedema Life Impact Scale (LLIS), patient will experience a reduction of at least 5 points in her perceived level of functional impairment resulting from lymphedema to improve functional performance and quality of life (QOL). Baseline: TBA % Goal status: INITIAL  2.  Pt will achieve at least a 10% volume reduction in B legs to return limb to typical size and shape, to limit infection risk and LE progression, to decrease pain, to improve function. Baseline: Dependent Goal status: INITIAL  3.  Pt will obtain appropriate compression garments/devices and achieve modified independence (extra time + assistive devices) with donning/doffing to optimize limb volume reductions and limit LE progression over time. Baseline: Dependent Goal status: INITIAL  During Intensive phase CDT, with max CG assistance, Pt will achieve at least 85% compliance with all adapted lymphedema self-care home program components, including daily skin care, compression wraps and /or garments, simple self MLD and lymphatic pumping therex to habituate LE self care protocol  into ADLs for optimal LE self-management over time. Baseline:  Dependent Goal status: INITIAL  PLAN:  OT FREQUENCY: 2x/week  OT DURATION: 12 weeks  PLANNED INTERVENTIONS:compression bandaging, skin care,  97110-Therapeutic exercises, 97530- Therapeutic activity, 97535- Self Care, Manual lymph drainage, DME instructions, and fit with appropriate compression  PLAN FOR NEXT SESSION:  Pt edu for all aspects of LE self-care- bandaging   Arnold Bicker, MS, OTR/L, CLT-LANA 07/21/23 11:55 AM

## 2023-07-26 ENCOUNTER — Ambulatory Visit: Admitting: Occupational Therapy

## 2023-07-26 DIAGNOSIS — I890 Lymphedema, not elsewhere classified: Secondary | ICD-10-CM

## 2023-07-26 NOTE — Therapy (Signed)
 OUTPATIENT OCCUPATIONAL THERAPY TREATMENT NOTE  BILATERAL LOWER EXTREMITY LYMPHEDEMA  Patient Name: Judith Arnold MRN: [id number] DOB:24-Sep-1950, 73 y.o., female Today's Date: 07/26/2023  END OF SESSION:   OT End of Session - 07/26/23 1459     Visit Number 4    Number of Visits 36    Date for OT Re-Evaluation 10/10/23    OT Start Time 0306    OT Stop Time 0359    OT Time Calculation (min) 53 min    Equipment Utilized During Treatment sock donner    Activity Tolerance Patient tolerated treatment well;No increased pain;Other (comment)   Pt unable to reach distal legs and feet. Must have max assist to apply traditional compression wraps.   Behavior During Therapy [facility] for tasks assessed/performed   verbose; needs frequent redirection to complete eval             Past Medical History:  Diagnosis Date   Allergy    Arthritis    lower back, left shoulder   Asthma    Carpal tunnel syndrome on both sides    Dental bridge present    top   Dental crown present    multiple   Family history of adverse reaction to anesthesia    Father and sister - PONV   GERD (gastroesophageal reflux disease)    Glaucoma    Hypertension    Leaky heart valve    Pre-diabetes    Sleep apnea    Past Surgical History:  Procedure Laterality Date   CATARACT EXTRACTION W/PHACO Right 11/16/2016   Procedure: CATARACT EXTRACTION PHACO AND INTRAOCULAR LENS PLACEMENT (IOC)  right;  Surgeon: Annell Kidney, MD;  Location: [facility];  Service: Ophthalmology;  Laterality: Right;  pre diabetic latex sensitivity sleep apnea   CATARACT EXTRACTION W/PHACO Left 01/18/2017   Procedure: CATARACT EXTRACTION PHACO AND INTRAOCULAR LENS PLACEMENT (IOC) LEFT DIABETIC;  Surgeon: Annell Kidney, MD;  Location: [facility];  Service: Ophthalmology;  Laterality: Left;   COLONOSCOPY     COLONOSCOPY WITH PROPOFOL  N/A 04/09/2018   Procedure: COLONOSCOPY WITH PROPOFOL ;  Surgeon: Cassie Click, MD;  Location: [facility];  Service: Endoscopy;  Laterality: N/A;   IRIDOTOMY / IRIDECTOMY Bilateral 2005   TOOTH EXTRACTION     Patient Active Problem List   Diagnosis Date Noted   Hypertension associated with diabetes (HCC) 01/20/2023   Combined hyperlipidemia associated with type 2 diabetes mellitus (HCC) 01/20/2023   Intertrigo 11/01/2022   Stasis dermatitis of both legs 10/21/2022   Leg swelling 10/21/2022   Lymphedema of lower extremity 10/21/2022   Type 2 diabetes mellitus without complication, without long-term current use of insulin (HCC) 05/24/2022   Mixed hyperlipidemia 05/24/2022   Essential hypertension, benign 05/24/2022   Absolute anemia 05/24/2022   Gastroesophageal reflux disease without esophagitis 05/24/2022   Allergic rhinitis 05/24/2022   Asthma, allergic, moderate persistent, uncomplicated 05/24/2022   Bilateral chronic angle-closure glaucoma, indeterminate stage 05/24/2022   Airway hyperreactivity 08/29/2013   OSA on CPAP 08/29/2013    PCP: Aisha Hove, MD  REFERRING PROVIDER: same  REFERRING DIAG: lymphedema I89.0  THERAPY DIAG:  Lymphedema, not elsewhere classified  Rationale for Evaluation and Treatment: Rehabilitation  ONSET DATE: Pt reports onset of L ankle / leg swelling after injury 20-30 yrs ago. Then about 1 year ago legs started swelling and Pt having prickly sensation on shins and thighs bilaterally.   SUBJECTIVE:  SUBJECTIVE STATEMENT: "Judith Kehr" Arnold presents to OT for treatment of BLE lymphedema 2/2 suspected CVI and obesity. Pt is accompanied today by Celestyne who will help with compression bandaging between visits. Pt reports she was able to remove compression wraps by herself and roll bandages. Pt was able to tolerate them without difficulty. Pt  denies LE related pain in her legs.   (INITIAL EVAL 07/12/23 : Judith "[place]" Arnold is referred to Occupational Therapy by Aisha Hove, MD,  for evaluation and treatment of BLE lymphedema.  Pt reports onset of L ankle and distal leg swelling about 30 years ago, then noticed RLE swelling and worsening LLE swelling about 1 year ago at same time she started having unusual sensation in legs. Pt denies previously having lymphedema treatment. Pt reports paternal aunt had leg swelling. Pt is unable to wear compression stockings because she is unable to reach feet and distal legs to don and doff them due to back and hip pain.)  PERTINENT HISTORY: Relevant to lymphedema (OSA (denies using CPAP), BLE stasis dermatitis, HTN, DM 2 (Pt states she is prediabetic only), B glaucoma, Back and R hip OA  PAIN:  Are you having pain? Yes: NPRS scale: not rated numerically Pain location: B legs Pain description: like "stickers" Aggravating factors: standing, walking, dependent sitting Relieving factors: elevation  PRECAUTIONS: Fall and Other: LYMPHEDEMA precautions  (DM 2 and asthma)  WEIGHT BEARING RESTRICTIONS: No  FALLS:  Has patient fallen in last 6 months? TBA  LIVING ENVIRONMENT: Lives with: TBA Lives in: House/apartment Stairs: No;  Has following equipment at home: Environmental consultant - 4 wheeled  OCCUPATION: retired Scientist, research (life sciences)  LEISURE: reading and taking notes on current events and politics  HAND DOMINANCE: right   PRIOR LEVEL OF FUNCTION: Independent with household mobility with device, Requires assistive device for independence, Needs assistance with ADLs, Needs assistance with homemaking, and Needs assistance with gait  PATIENT GOALS: reduce swelling and keep it from getting worse  OBJECTIVE: Note: Objective measures were completed at Evaluation unless otherwise noted.  COGNITION:  Overall cognitive status: impaired short term  memory   OBSERVATIONS / OTHER ASSESSMENTS: Mild,  Stage II, BLE Lymphedema 2/2 suspected venous insufficiency.   SENSATION: Reports uncomfortable scratching, or sticker-like  sensation on anterior thighs   POSTURE: Raised walker handles 2 notches to increase upright spinal alignment when walking to limit falls risk. Handles need to be raised at least 1 hole more for safe upright posture when walking.  Upright sitting posture: head forward , shoulders forward and rounded- appears to be flexible kyphosis  LE ROM: WFL for ankles and knees. Pt reports limited hip abduction )  LE MMT: WFL FOR LYMPHEDEMA CARE. Presenting with generalized weakness and Pt reports debility  LYMPHEDEMA ASSESSMENTS:   SURGERY TYPE/DATE: Non-cancer related limb swelling  HX INFECTIONS: positive for 1 episode cellulitis treated w antibiotics  Hx WOUNDS: denies   BLE COMPARATIVE LIMB VOLUMETRICS: INITIAL 07/19/23  LANDMARK RIGHT (dominant)  R LEG (A-D) 3525.1 ml  R THIGH (E-G) ml  R FULL LIMB (A-G) ml  Limb Volume differential (LVD)  %  Volume change since initial %  Volume change overall V  (Blank rows = not tested)  LANDMARK LEFT    L LEG (A-D) 3769.6 ml  L THIGH (E-G) ml  L FULL LIMB (A-G) ml  Limb Volume differential (LVD)  Limb Volume Differential (LVD) measures 6.5%, L>R.  Volume change since initial %  Volume change overall %    Mild, Stage  II, Bilateral Lower Extremity Lymphedema 2/2 CVI and Obesity  Skin  Description Hyper-Keratosis Peau' de Orange Shiny Tight Fibrotic/ Indurated Fatty Doughy Spongy/ boggy       R>L x  x   Skin dry Flaky WNL Macerated   mildly      Color Redness Varicosities Blanching Hemosiderin Stain Mottled   x     x   Odor Malodorous Yeast Fungal infection  WNL      x   Temperature Warm Cool wnl    x     Pitting Edema   1+ 2+ 3+ 4+ Non-pitting         x   Girth Symmetrical Asymmetrical                   Distribution    R>L toes to groin    Stemmer Sign Positive Negative   +    Lymphorrhea  History Of:  Present Absent     x    Wounds History Of Present Absent Venous Arterial Pressure Sheer     x        Signs of Infection Redness Warmth Erythema Acute Swelling Drainage Borders                    Sensation Light Touch Deep pressure Hypersensitivity   Present Impaired Present Impaired Absent Impaired   x Tactile  x  x     Nails WNL   Fungus nail dystrophy   x     Hair Growth Symmetrical Asymmetrical   x    Skin Creases Base of toes  Ankles   Base of Fingers knees       Abdominal pannus Thigh Lobules  Face/neck   x x  x      (Blank rows = not tested)    GAIT: Distance walked: >500' Assistive device utilized: Environmental consultant - 4 wheeled Level of assistance: Modified independence Comments: Pt bent at waist nearly 90 degrees with walker handles in lowest position  LYMPHEDEMA LIFE IMPACT SCALE (LLIS): TBA OT Rx visit 1  TREATMENT DATE:  RLE multilayer compression bandaging Pt edu for lymphedema self-care- compression wrapping    PATIENT EDUCATION:  Continued Pt/ CG edu for lymphedema self care home program throughout session. Topics include outcome of comparative limb volumetrics- starting limb volume differentials (LVDs), technology and gradient techniques used for short stretch, multilayer compression wrapping, simple self-MLD, therapeutic lymphatic pumping exercises, skin/nail care, LE precautions, compression garment recommendations and specifications, wear and care schedule and compression garment donning / doffing w assistive devices. Discussed progress towards all OT goals since commencing CDT. Discussed detrimental impact of obesity on lower and upper extremity lymphedema over time. Reviewed OT goals for lymphedema care with Pt and discussed progress to date.  All questions answered to the Pt's satisfaction. Good return. Person educated: Patient  Education method: Explanation, Demonstration, and Handouts Education comprehension: verbalized understanding,  returned demonstration, verbal cues required, and needs further education   HOME EXERCISE PROGRAM: BLE lymphatic pumping there ex using- 1 sets of 10 reps, each exercise in order-  1-2 x daily, bilaterally Simple self MLD 1 x daily Daily skin care to increase hydration, skin mobility and decrease infection risk- can be done during MLD During Intensive Phase CDT: Compression wraps 23/7 until limb volume reduction complete During Self-management Phase CDT: Fit with appropriate compression garments or alternatives. Consider BLE, knee length, Mediven, custom, CircAid , Velcro style leggings over soft cotton liners.  ASSESSMENT: CLINICAL IMPRESSION: Entire  visit dedicated to teaching Pt and caregiver/friend how to apply multilayer compression wraps using gradient techniques. By end of session caregiver was able to apply wraps with min assistance. Pt able to cue caregiver intermittently to correct technique. With practice I believe caregiver will learn techniques quickly.  Cont as per POC.   (INITIAL EVAL 07/12/23: Loralee "Judith Kehr" Richad Champagne is a 73 yo female presenting with chronic, progressive, BLE swelling 2/2 unknown etiology, which at first glance appears to be circulatory in nature due to skin coloring at distal legs where swelling is most invested. Pt is presently unable to wear traditional elastic compression garments due to difficulty donning and doffing them.  Chronic, progressive lymphedema with associated skin changes, including fibrosis, limits this patent's functional performance in all occupational domains, including functional ambulation and mobility, basic and instrumental ADLs (lower body dressing, LB bathing, fitting street shoes and LB clothing, driving, shopping, and home management. It also limits perform productive activities, leisure pursuits, and participation in social and community activities. BLE lymphedema contributes to elevated infection risk. Pt will benefit from skilled OT for  Complete Decongestive Therapy (CDT), which  typically includes manual lymphatic drainage (MLD), skin care to limit' infection risk and increase skin excursion, lymphatic pumping exercise, and during the Intensive Phase multilayer, gradient compression bandaging to reduce limb volume. Once limb volume is reduced as much as possible, Pt is fitted with appropriate compression garments and/ or devices and transitions into the self management phase of care consisting of follow long support PRN.    Pt understands that her fair prognosis will become poor without daily assistance with compression wrapping since she is unable to reach feet and legs to apply them herself. Pt assures OT that she has a friend she believes is willing to assist her between OT sessions. )   OBJECTIVE IMPAIRMENTS: decreased standing and walking tolerance, activity tolerance, decreased balance, decreased knowledge of condition, decreased knowledge of use of DME, decreased mobility, decreased ROM, decreased strength, increased edema, impaired flexibility, impaired sensation, impaired UE functional use, impaired vision/reception, pain, and chronic, progressive, BLE swelling and associated skin changes, at increased infection risk   ACTIVITY LIMITATIONS: Functional ambulation and mobility (lifting, carrying, steps/stairs, transfers, squatting) , Basic and instrumental ADLs, leisure pursuits, productive activities, social participation  PARTICIPATION LIMITATIONS: meal prep, cleaning, laundry, driving, shopping, yard work, and    PERSONAL FACTORS: Age, Fitness, Past/current experiences, Time since onset of injury/illness/exacerbation, 2+ co morbidities: OSA, HTN,  are also affecting patient's functional outcome.   REHAB POTENTIAL: Good  EVALUATION COMPLEXITY: Moderate   GOALS: Goals reviewed with patient? Yes  SHORT TERM GOALS: Target date: 4th OT Rx visit   Pt will demonstrate understanding of lymphedema precautions and prevention  strategies with modified independence using a printed reference to identify at least 5 precautions and discussing how s/he may implement them into daily life to reduce risk of progression with extra time. Baseline:Max A Goal status: INITIAL  2.  Pt will be able to apply multilayer, knee length, gradient, compression wraps to one leg at a time from toes to below knee with max caregiver assist to decrease limb volume, to limit infection risk, and to limit lymphedema progression.  Baseline: Dependent Goal status: INITIAL  LONG TERM GOALS: Target date: 09/19/23  1.Given this patient's Intake score of TBA % on the Lymphedema Life Impact Scale (LLIS), patient will experience a reduction of at least 5 points in her perceived level of functional impairment resulting from lymphedema to improve functional performance and quality  of life (QOL). Baseline: TBA % Goal status: INITIAL  2.  Pt will achieve at least a 10% volume reduction in B legs to return limb to typical size and shape, to limit infection risk and LE progression, to decrease pain, to improve function. Baseline: Dependent Goal status: INITIAL  3.  Pt will obtain appropriate compression garments/devices and achieve modified independence (extra time + assistive devices) with donning/doffing to optimize limb volume reductions and limit LE progression over time. Baseline: Dependent Goal status: INITIAL  During Intensive phase CDT, with max CG assistance, Pt will achieve at least 85% compliance with all adapted lymphedema self-care home program components, including daily skin care, compression wraps and /or garments, simple self MLD and lymphatic pumping therex to habituate LE self care protocol  into ADLs for optimal LE self-management over time. Baseline: Dependent Goal status: INITIAL  PLAN:  OT FREQUENCY: 2 x/week  OT DURATION: 12 weeks  PLANNED INTERVENTIONS:compression bandaging, skin care,  97110-Therapeutic exercises, 97530-  Therapeutic activity, 97535- Self Care, Manual lymph drainage, DME instructions, and fit with appropriate compression  PLAN FOR NEXT SESSION:  Pt edu for all aspects of LE self-care- bandaging   Arnold Bicker, MS, OTR/L, CLT-LANA 07/26/23 4:06 PM

## 2023-07-28 ENCOUNTER — Ambulatory Visit: Admitting: Occupational Therapy

## 2023-07-28 DIAGNOSIS — I890 Lymphedema, not elsewhere classified: Secondary | ICD-10-CM | POA: Diagnosis not present

## 2023-07-28 NOTE — Therapy (Signed)
 OUTPATIENT OCCUPATIONAL THERAPY TREATMENT NOTE  BILATERAL LOWER EXTREMITY LYMPHEDEMA  Patient Name: Judith Arnold MRN: [id number] DOB:1950/08/31, 73 y.o., female Today's Date: 07/28/2023  END OF SESSION:   OT End of Session - 07/28/23 1110     Visit Number 5    Number of Visits 36    Date for OT Re-Evaluation 10/10/23    OT Start Time 1107    OT Stop Time 1155    OT Time Calculation (min) 48 min    Equipment Utilized During Treatment sock donner    Activity Tolerance Patient tolerated treatment well;No increased pain;Other (comment)   Pt unable to reach distal legs and feet. Must have max assist to apply traditional compression wraps.   Behavior During Therapy [facility] for tasks assessed/performed   verbose; needs frequent redirection to complete eval             Past Medical History:  Diagnosis Date   Allergy    Arthritis    lower back, left shoulder   Asthma    Carpal tunnel syndrome on both sides    Dental bridge present    top   Dental crown present    multiple   Family history of adverse reaction to anesthesia    Father and sister - PONV   GERD (gastroesophageal reflux disease)    Glaucoma    Hypertension    Leaky heart valve    Pre-diabetes    Sleep apnea    Past Surgical History:  Procedure Laterality Date   CATARACT EXTRACTION W/PHACO Right 11/16/2016   Procedure: CATARACT EXTRACTION PHACO AND INTRAOCULAR LENS PLACEMENT (IOC)  right;  Surgeon: Annell Kidney, MD;  Location: [facility];  Service: Ophthalmology;  Laterality: Right;  pre diabetic latex sensitivity sleep apnea   CATARACT EXTRACTION W/PHACO Left 01/18/2017   Procedure: CATARACT EXTRACTION PHACO AND INTRAOCULAR LENS PLACEMENT (IOC) LEFT DIABETIC;  Surgeon: Annell Kidney, MD;  Location: [facility];  Service: Ophthalmology;  Laterality: Left;   COLONOSCOPY     COLONOSCOPY WITH PROPOFOL  N/A 04/09/2018   Procedure: COLONOSCOPY WITH PROPOFOL ;  Surgeon: Cassie Click, MD;  Location: [facility];  Service: Endoscopy;  Laterality: N/A;   IRIDOTOMY / IRIDECTOMY Bilateral 2005   TOOTH EXTRACTION     Patient Active Problem List   Diagnosis Date Noted   Hypertension associated with diabetes (HCC) 01/20/2023   Combined hyperlipidemia associated with type 2 diabetes mellitus (HCC) 01/20/2023   Intertrigo 11/01/2022   Stasis dermatitis of both legs 10/21/2022   Leg swelling 10/21/2022   Lymphedema of lower extremity 10/21/2022   Type 2 diabetes mellitus without complication, without long-term current use of insulin (HCC) 05/24/2022   Mixed hyperlipidemia 05/24/2022   Essential hypertension, benign 05/24/2022   Absolute anemia 05/24/2022   Gastroesophageal reflux disease without esophagitis 05/24/2022   Allergic rhinitis 05/24/2022   Asthma, allergic, moderate persistent, uncomplicated 05/24/2022   Bilateral chronic angle-closure glaucoma, indeterminate stage 05/24/2022   Airway hyperreactivity 08/29/2013   OSA on CPAP 08/29/2013    PCP: Aisha Hove, MD  REFERRING PROVIDER: same  REFERRING DIAG: lymphedema I89.0  THERAPY DIAG:  Lymphedema, not elsewhere classified  Rationale for Evaluation and Treatment: Rehabilitation  ONSET DATE: Pt reports onset of L ankle / leg swelling after injury 20-30 yrs ago. Then about 1 year ago legs started swelling and Pt having prickly sensation on shins and thighs bilaterally.   SUBJECTIVE:  SUBJECTIVE STATEMENT: "Judith Kehr" Arnold presents to OT for treatment of BLE lymphedema 2/2 suspected CVI and obesity. Pt is unaccompanied today. Pt reports her friend who is assisting her with compression wraps between visits got them a little too tight the first time.  Pt states she had to take wraps off because they were too tight. Pt reports  pain  in proximal thighs 5/10.   (INITIAL EVAL 07/12/23 : Judith Arnold is referred to Occupational Therapy by Aisha Hove, MD,  for evaluation and treatment of BLE lymphedema.  Pt reports onset of L ankle and distal leg swelling about 30 years ago, then noticed RLE swelling and worsening LLE swelling about 1 year ago at same time she started having unusual sensation in legs. Pt denies previously having lymphedema treatment. Pt reports paternal aunt had leg swelling. Pt is unable to wear compression stockings because she is unable to reach feet and distal legs to don and doff them due to back and hip pain.)  PERTINENT HISTORY: Relevant to lymphedema (OSA (denies using CPAP), BLE stasis dermatitis, HTN, DM 2 (Pt states she is prediabetic only), B glaucoma, Back and R hip OA  PAIN:  Are you having pain? Yes: NPRS scale: 5/10 Pain location: B legs Pain description: like "stickers" Aggravating factors: standing, walking, dependent sitting Relieving factors: elevation  PRECAUTIONS: Fall and Other: LYMPHEDEMA precautions  (DM 2 and asthma)  WEIGHT BEARING RESTRICTIONS: No  FALLS:  Has patient fallen in last 6 months? TBA  LIVING ENVIRONMENT: Lives with: TBA Lives in: House/apartment Stairs: No;  Has following equipment at home: Environmental consultant - 4 wheeled  OCCUPATION: retired Scientist, research (life sciences)  LEISURE: reading and taking notes on current events and politics  HAND DOMINANCE: right   PRIOR LEVEL OF FUNCTION: Independent with household mobility with device, Requires assistive device for independence, Needs assistance with ADLs, Needs assistance with homemaking, and Needs assistance with gait  PATIENT GOALS: reduce swelling and keep it from getting worse  OBJECTIVE: Note: Objective measures were completed at Evaluation unless otherwise noted.  COGNITION:  Overall cognitive status: impaired short term  memory   OBSERVATIONS / OTHER ASSESSMENTS: Mild, Stage II, BLE Lymphedema  2/2 suspected venous insufficiency.   SENSATION: Reports uncomfortable scratching, or sticker-like  sensation on anterior thighs   POSTURE: Raised walker handles 2 notches to increase upright spinal alignment when walking to limit falls risk. Handles need to be raised at least 1 hole more for safe upright posture when walking.  Upright sitting posture: head forward , shoulders forward and rounded- appears to be flexible kyphosis  LE ROM: WFL for ankles and knees. Pt reports limited hip abduction )  LE MMT: WFL FOR LYMPHEDEMA CARE. Presenting with generalized weakness and Pt reports debility  LYMPHEDEMA ASSESSMENTS:   SURGERY TYPE/DATE: Non-cancer related limb swelling  HX INFECTIONS: positive for 1 episode cellulitis treated w antibiotics  Hx WOUNDS: denies   BLE COMPARATIVE LIMB VOLUMETRICS: INITIAL 07/19/23  LANDMARK RIGHT (dominant)  R LEG (A-D) 3525.1 ml  R THIGH (E-G) ml  R FULL LIMB (A-G) ml  Limb Volume differential (LVD)  %  Volume change since initial %  Volume change overall V  (Blank rows = not tested)  LANDMARK LEFT    L LEG (A-D) 3769.6 ml  L THIGH (E-G) ml  L FULL LIMB (A-G) ml  Limb Volume differential (LVD)  Limb Volume Differential (LVD) measures 6.5%, L>R.  Volume change since initial %  Volume change overall %    Mild,  Stage  II, Bilateral Lower Extremity Lymphedema 2/2 CVI and Obesity  Skin  Description Hyper-Keratosis Peau' de Orange Shiny Tight Fibrotic/ Indurated Fatty Doughy Spongy/ boggy       R>L x  x   Skin dry Flaky WNL Macerated   mildly      Color Redness Varicosities Blanching Hemosiderin Stain Mottled   x     x   Odor Malodorous Yeast Fungal infection  WNL      x   Temperature Warm Cool wnl    x     Pitting Edema   1+ 2+ 3+ 4+ Non-pitting         x   Girth Symmetrical Asymmetrical                   Distribution    R>L toes to groin    Stemmer Sign Positive Negative   +    Lymphorrhea History Of:  Present  Absent     x    Wounds History Of Present Absent Venous Arterial Pressure Sheer     x        Signs of Infection Redness Warmth Erythema Acute Swelling Drainage Borders                    Sensation Light Touch Deep pressure Hypersensitivity   Present Impaired Present Impaired Absent Impaired   x Tactile  x  x     Nails WNL   Fungus nail dystrophy   x     Hair Growth Symmetrical Asymmetrical   x    Skin Creases Base of toes  Ankles   Base of Fingers knees       Abdominal pannus Thigh Lobules  Face/neck   x x  x      (Blank rows = not tested)    GAIT: Distance walked: >500' Assistive device utilized: Environmental consultant - 4 wheeled Level of assistance: Modified independence Comments: Pt bent at waist nearly 90 degrees with walker handles in lowest position  LYMPHEDEMA LIFE IMPACT SCALE (LLIS): TBA OT Rx visit 1  TREATMENT DATE:  RLE multilayer compression bandaging Pt edu for lymphedema self-care- compression wrapping    PATIENT EDUCATION:  Continued Pt/ CG edu for lymphedema self care home program throughout session. Topics include outcome of comparative limb volumetrics- starting limb volume differentials (LVDs), technology and gradient techniques used for short stretch, multilayer compression wrapping, simple self-MLD, therapeutic lymphatic pumping exercises, skin/nail care, LE precautions, compression garment recommendations and specifications, wear and care schedule and compression garment donning / doffing w assistive devices. Discussed progress towards all OT goals since commencing CDT. Discussed detrimental impact of obesity on lower and upper extremity lymphedema over time. Reviewed OT goals for lymphedema care with Pt and discussed progress to date.  All questions answered to the Pt's satisfaction. Good return. Person educated: Patient  Education method: Explanation, Demonstration, and Handouts Education comprehension: verbalized understanding, returned demonstration,  verbal cues required, and needs further education   HOME EXERCISE PROGRAM: BLE lymphatic pumping there ex using- 1 sets of 10 reps, each exercise in order-  1-2 x daily, bilaterally Simple self MLD 1 x daily Daily skin care to increase hydration, skin mobility and decrease infection risk- can be done during MLD During Intensive Phase CDT: Compression wraps 23/7 until limb volume reduction complete During Self-management Phase CDT: Fit with appropriate compression garments or alternatives. Consider BLE, knee length, Mediven, custom, CircAid , Velcro style leggings over soft cotton liners.  ASSESSMENT: CLINICAL  IMPRESSION: Commenced MLD to RLE/RLQ today utilizing functional inguinal LN, deep abdominal pathways and modified short neck sequence to avoid thyroid region (No strokes to lateral neck and very gentle strokes to R and L of sternal notch over terminus. Pt tolerated manual therapy without increased pain. Provided skin care to limit infection risk using castor oil to increase skin excursion during MLD. Pt educated throughout session on lymphatic map, structure and function. Reapplied multilayer, knee length compression wraps as established, reviewing techniques simultaneously. Cont as per POC.  (INITIAL EVAL 07/12/23: Marketia "Judith Kehr" Richad Champagne is a 73 yo female presenting with chronic, progressive, BLE swelling 2/2 unknown etiology, which at first glance appears to be circulatory in nature due to skin coloring at distal legs where swelling is most invested. Pt is presently unable to wear traditional elastic compression garments due to difficulty donning and doffing them.  Chronic, progressive lymphedema with associated skin changes, including fibrosis, limits this patent's functional performance in all occupational domains, including functional ambulation and mobility, basic and instrumental ADLs (lower body dressing, LB bathing, fitting street shoes and LB clothing, driving, shopping, and home  management. It also limits perform productive activities, leisure pursuits, and participation in social and community activities. BLE lymphedema contributes to elevated infection risk. Pt will benefit from skilled OT for Complete Decongestive Therapy (CDT), which  typically includes manual lymphatic drainage (MLD), skin care to limit' infection risk and increase skin excursion, lymphatic pumping exercise, and during the Intensive Phase multilayer, gradient compression bandaging to reduce limb volume. Once limb volume is reduced as much as possible, Pt is fitted with appropriate compression garments and/ or devices and transitions into the self management phase of care consisting of follow long support PRN.    Pt understands that her fair prognosis will become poor without daily assistance with compression wrapping since she is unable to reach feet and legs to apply them herself. Pt assures OT that she has a friend she believes is willing to assist her between OT sessions. )   OBJECTIVE IMPAIRMENTS: decreased standing and walking tolerance, activity tolerance, decreased balance, decreased knowledge of condition, decreased knowledge of use of DME, decreased mobility, decreased ROM, decreased strength, increased edema, impaired flexibility, impaired sensation, impaired UE functional use, impaired vision/reception, pain, and chronic, progressive, BLE swelling and associated skin changes, at increased infection risk   ACTIVITY LIMITATIONS: Functional ambulation and mobility (lifting, carrying, steps/stairs, transfers, squatting) , Basic and instrumental ADLs, leisure pursuits, productive activities, social participation  PARTICIPATION LIMITATIONS: meal prep, cleaning, laundry, driving, shopping, yard work, and    PERSONAL FACTORS: Age, Fitness, Past/current experiences, Time since onset of injury/illness/exacerbation, 2+ co morbidities: OSA, HTN,  are also affecting patient's functional outcome.   REHAB  POTENTIAL: Good  EVALUATION COMPLEXITY: Moderate   GOALS: Goals reviewed with patient? Yes  SHORT TERM GOALS: Target date: 4th OT Rx visit   Pt will demonstrate understanding of lymphedema precautions and prevention strategies with modified independence using a printed reference to identify at least 5 precautions and discussing how s/he may implement them into daily life to reduce risk of progression with extra time. Baseline:Max A Goal status: INITIAL  2.  Pt will be able to apply multilayer, knee length, gradient, compression wraps to one leg at a time from toes to below knee with max caregiver assist to decrease limb volume, to limit infection risk, and to limit lymphedema progression.  Baseline: Dependent Goal status: INITIAL  LONG TERM GOALS: Target date: 09/19/23  1.Given this patient's Intake score of  TBA % on the Lymphedema Life Impact Scale (LLIS), patient will experience a reduction of at least 5 points in her perceived level of functional impairment resulting from lymphedema to improve functional performance and quality of life (QOL). Baseline: TBA % Goal status: INITIAL  2.  Pt will achieve at least a 10% volume reduction in B legs to return limb to typical size and shape, to limit infection risk and LE progression, to decrease pain, to improve function. Baseline: Dependent Goal status: INITIAL  3.  Pt will obtain appropriate compression garments/devices and achieve modified independence (extra time + assistive devices) with donning/doffing to optimize limb volume reductions and limit LE progression over time. Baseline: Dependent Goal status: INITIAL  During Intensive phase CDT, with max CG assistance, Pt will achieve at least 85% compliance with all adapted lymphedema self-care home program components, including daily skin care, compression wraps and /or garments, simple self MLD and lymphatic pumping therex to habituate LE self care protocol  into ADLs for optimal LE  self-management over time. Baseline: Dependent Goal status: INITIAL  PLAN:  OT FREQUENCY: 2 x/week  OT DURATION: 12 weeks  PLANNED INTERVENTIONS:compression bandaging, skin care,  97110-Therapeutic exercises, 97530- Therapeutic activity, 97535- Self Care, Manual lymph drainage, DME instructions, and fit with appropriate compression  PLAN FOR NEXT SESSION:  Pt edu for all aspects of LE self-care- bandaging   Arnold Bicker, MS, OTR/L, CLT-LANA 07/28/23 11:57 AM

## 2023-08-01 ENCOUNTER — Encounter (INDEPENDENT_AMBULATORY_CARE_PROVIDER_SITE_OTHER): Payer: Self-pay

## 2023-08-02 ENCOUNTER — Encounter: Payer: Self-pay | Admitting: Occupational Therapy

## 2023-08-02 ENCOUNTER — Ambulatory Visit: Admitting: Occupational Therapy

## 2023-08-02 DIAGNOSIS — I890 Lymphedema, not elsewhere classified: Secondary | ICD-10-CM

## 2023-08-02 NOTE — Therapy (Signed)
 OUTPATIENT OCCUPATIONAL THERAPY TREATMENT NOTE  BILATERAL LOWER EXTREMITY LYMPHEDEMA  Patient Name: Judith Arnold MRN: [id number] DOB:08-29-1950, 73 y.o., female Today's Date: 08/02/2023  END OF SESSION:   OT End of Session - 08/02/23 1414     Visit Number 6    Number of Visits 36    Date for OT Re-Evaluation 10/10/23    OT Start Time 0200    OT Stop Time 0315    OT Time Calculation (min) 75 min    Activity Tolerance Patient tolerated treatment well;No increased pain;Other (comment)   Pt unable to reach distal legs and feet. Must have max assist to apply traditional compression wraps.   Behavior During Therapy [facility] for tasks assessed/performed   verbose; needs frequent redirection to complete eval             Past Medical History:  Diagnosis Date   Allergy    Arthritis    lower back, left shoulder   Asthma    Carpal tunnel syndrome on both sides    Dental bridge present    top   Dental crown present    multiple   Family history of adverse reaction to anesthesia    Father and sister - PONV   GERD (gastroesophageal reflux disease)    Glaucoma    Hypertension    Leaky heart valve    Pre-diabetes    Sleep apnea    Past Surgical History:  Procedure Laterality Date   CATARACT EXTRACTION W/PHACO Right 11/16/2016   Procedure: CATARACT EXTRACTION PHACO AND INTRAOCULAR LENS PLACEMENT (IOC)  right;  Surgeon: Judith Kidney, MD;  Location: [facility];  Service: Ophthalmology;  Laterality: Right;  pre diabetic latex sensitivity sleep apnea   CATARACT EXTRACTION W/PHACO Left 01/18/2017   Procedure: CATARACT EXTRACTION PHACO AND INTRAOCULAR LENS PLACEMENT (IOC) LEFT DIABETIC;  Surgeon: Judith Kidney, MD;  Location: [facility];  Service: Ophthalmology;  Laterality: Left;   COLONOSCOPY     COLONOSCOPY WITH PROPOFOL  N/A 04/09/2018   Procedure: COLONOSCOPY WITH PROPOFOL ;  Surgeon: Judith Click, MD;  Location: [facility];  Service:  Endoscopy;  Laterality: N/A;   IRIDOTOMY / IRIDECTOMY Bilateral 2005   TOOTH EXTRACTION     Patient Active Problem List   Diagnosis Date Noted   Hypertension associated with diabetes (HCC) 01/20/2023   Combined hyperlipidemia associated with type 2 diabetes mellitus (HCC) 01/20/2023   Intertrigo 11/01/2022   Stasis dermatitis of both legs 10/21/2022   Leg swelling 10/21/2022   Lymphedema of lower extremity 10/21/2022   Type 2 diabetes mellitus without complication, without long-term current use of insulin (HCC) 05/24/2022   Mixed hyperlipidemia 05/24/2022   Essential hypertension, benign 05/24/2022   Absolute anemia 05/24/2022   Gastroesophageal reflux disease without esophagitis 05/24/2022   Allergic rhinitis 05/24/2022   Asthma, allergic, moderate persistent, uncomplicated 05/24/2022   Bilateral chronic angle-closure glaucoma, indeterminate stage 05/24/2022   Airway hyperreactivity 08/29/2013   OSA on CPAP 08/29/2013    PCP: Judith Hove, MD  REFERRING PROVIDER: same  REFERRING DIAG: lymphedema I89.0  THERAPY DIAG:  Lymphedema, not elsewhere classified  Rationale for Evaluation and Treatment: Rehabilitation  ONSET DATE: Pt reports onset of L ankle / leg swelling after injury 20-30 yrs ago. Then about 1 year ago legs started swelling and Pt having prickly sensation on shins and thighs bilaterally.   SUBJECTIVE:  SUBJECTIVE STATEMENT: "Judith Kehr" Arnold presents to OT for treatment of BLE lymphedema 2/2 suspected CVI and obesity. Pt is unaccompanied today. Pt reports her friend who is assisting her with compression wraps will come again tomorrow. Pt states, "when she wraps she puts too much on my foot and I can't get my slipper on". Pt reports pain  in proximal thighs 5/10.   (INITIAL EVAL  07/12/23 : Judith Arnold is referred to Occupational Therapy by Judith Hove, MD,  for evaluation and treatment of BLE lymphedema.  Pt reports onset of L ankle and distal leg swelling about 30 years ago, then noticed RLE swelling and worsening LLE swelling about 1 year ago at same time she started having unusual sensation in legs. Pt denies previously having lymphedema treatment. Pt reports paternal aunt had leg swelling. Pt is unable to wear compression stockings because she is unable to reach feet and distal legs to don and doff them due to back and hip pain.)  PERTINENT HISTORY: Relevant to lymphedema (OSA (denies using CPAP), BLE stasis dermatitis, HTN, DM 2 (Pt states she is prediabetic only), B glaucoma, Back and R hip OA  PAIN:  Are you having pain? Yes: NPRS scale: 5/10 Pain location: B legs Pain description: like "stickers" Aggravating factors: standing, walking, dependent sitting Relieving factors: elevation  PRECAUTIONS: Fall and Other: LYMPHEDEMA precautions  (DM 2 and asthma)  WEIGHT BEARING RESTRICTIONS: No  FALLS:  Has patient fallen in last 6 months? TBA  LIVING ENVIRONMENT: Lives with: TBA Lives in: House/apartment Stairs: No;  Has following equipment at home: Environmental consultant - 4 wheeled  OCCUPATION: retired Scientist, research (life sciences)  LEISURE: reading and taking notes on current events and politics  HAND DOMINANCE: right   PRIOR LEVEL OF FUNCTION: Independent with household mobility with device, Requires assistive device for independence, Needs assistance with ADLs, Needs assistance with homemaking, and Needs assistance with gait  PATIENT GOALS: reduce swelling and keep it from getting worse  OBJECTIVE: Note: Objective measures were completed at Evaluation unless otherwise noted.  COGNITION:  Overall cognitive status: impaired short term  memory   OBSERVATIONS / OTHER ASSESSMENTS: Mild, Stage II, BLE Lymphedema 2/2 suspected venous insufficiency.    SENSATION: Reports uncomfortable scratching, or sticker-like  sensation on anterior thighs   POSTURE: Raised walker handles 2 notches to increase upright spinal alignment when walking to limit falls risk. Handles need to be raised at least 1 hole more for safe upright posture when walking.  Upright sitting posture: head forward , shoulders forward and rounded- appears to be flexible kyphosis  LE ROM: WFL for ankles and knees. Pt reports limited hip abduction )  LE MMT: WFL FOR LYMPHEDEMA CARE. Presenting with generalized weakness and Pt reports debility  LYMPHEDEMA ASSESSMENTS:   SURGERY TYPE/DATE: Non-cancer related limb swelling  HX INFECTIONS: positive for 1 episode cellulitis treated w antibiotics  Hx WOUNDS: denies   BLE COMPARATIVE LIMB VOLUMETRICS: INITIAL 07/19/23  LANDMARK RIGHT (dominant)  R LEG (A-D) 3525.1 ml  R THIGH (E-G) ml  R FULL LIMB (A-G) ml  Limb Volume differential (LVD)  %  Volume change since initial %  Volume change overall V  (Blank rows = not tested)  LANDMARK LEFT    L LEG (A-D) 3769.6 ml  L THIGH (E-G) ml  L FULL LIMB (A-G) ml  Limb Volume differential (LVD)  Limb Volume Differential (LVD) measures 6.5%, L>R.  Volume change since initial %  Volume change overall %    Mild, Stage  II, Bilateral Lower Extremity Lymphedema 2/2 CVI and Obesity  Skin  Description Hyper-Keratosis Peau' de Orange Shiny Tight Fibrotic/ Indurated Fatty Doughy Spongy/ boggy       R>L x  x   Skin dry Flaky WNL Macerated   mildly      Color Redness Varicosities Blanching Hemosiderin Stain Mottled   x     x   Odor Malodorous Yeast Fungal infection  WNL      x   Temperature Warm Cool wnl    x     Pitting Edema   1+ 2+ 3+ 4+ Non-pitting         x   Girth Symmetrical Asymmetrical                   Distribution    R>L toes to groin    Stemmer Sign Positive Negative   +    Lymphorrhea History Of:  Present Absent     x    Wounds History Of  Present Absent Venous Arterial Pressure Sheer     x        Signs of Infection Redness Warmth Erythema Acute Swelling Drainage Borders                    Sensation Light Touch Deep pressure Hypersensitivity   Present Impaired Present Impaired Absent Impaired   x Tactile  x  x     Nails WNL   Fungus nail dystrophy   x     Hair Growth Symmetrical Asymmetrical   x    Skin Creases Base of toes  Ankles   Base of Fingers knees       Abdominal pannus Thigh Lobules  Face/neck   x x  x      (Blank rows = not tested)    GAIT: Distance walked: >500' Assistive device utilized: Environmental consultant - 4 wheeled Level of assistance: Modified independence Comments: Pt bent at waist nearly 90 degrees with walker handles in lowest position  LYMPHEDEMA LIFE IMPACT SCALE (LLIS): TBA OT Rx visit 1  TREATMENT DATE:  RLE/RLQ MLD with simultaneous skin care as established RLE multilayer compression bandaging Pt edu for lymphedema self-care throughout session    PATIENT EDUCATION:  Continued Pt/ CG edu for lymphedema self care home program throughout session. Topics include outcome of comparative limb volumetrics- starting limb volume differentials (LVDs), technology and gradient techniques used for short stretch, multilayer compression wrapping, simple self-MLD, therapeutic lymphatic pumping exercises, skin/nail care, LE precautions, compression garment recommendations and specifications, wear and care schedule and compression garment donning / doffing w assistive devices. Discussed progress towards all OT goals since commencing CDT. Discussed detrimental impact of obesity on lower and upper extremity lymphedema over time. Reviewed OT goals for lymphedema care with Pt and discussed progress to date.  All questions answered to the Pt's satisfaction. Good return. Person educated: Patient  Education method: Explanation, Demonstration, and Handouts Education comprehension: verbalized understanding, returned  demonstration, verbal cues required, and needs further education   HOME EXERCISE PROGRAM: BLE lymphatic pumping there ex using- 1 sets of 10 reps, each exercise in order-  1-2 x daily, bilaterally Simple self MLD 1 x daily Daily skin care to increase hydration, skin mobility and decrease infection risk- can be done during MLD During Intensive Phase CDT: Compression wraps 23/7 until limb volume reduction complete During Self-management Phase CDT: Fit with appropriate compression garments or alternatives. Consider BLE, knee length, Mediven, custom, CircAid , Velcro style leggings over  soft cotton liners.  ASSESSMENT: CLINICAL IMPRESSION: Continued MLD to RLE/RLQ utilizing functional inguinal LN, deep abdominal pathways and modified short neck sequence to avoid thyroid region (No strokes to lateral neck and very gentle strokes to R and L of sternal notch over terminus. Pt tolerated manual therapy without increased pain. Provided skin care to limit infection risk using castor oil to increase skin excursion during MLD. Pt educated throughout session on lymphedema self care. Reapplied multilayer, knee length compression wraps, adding Artiflex to change shape at distal leg to reduce compression. Stasis dermatitis on anterior R leg appears  darker red today. Wraps may be exacerbating it. Reviewed bandaging reviewing simultaneously with wrapping. Cont as per POC.  (INITIAL EVAL 07/12/23: Cyndel "Judith Kehr" Richad Champagne is a 73 yo female presenting with chronic, progressive, BLE swelling 2/2 unknown etiology, which at first glance appears to be circulatory in nature due to skin coloring at distal legs where swelling is most invested. Pt is presently unable to wear traditional elastic compression garments due to difficulty donning and doffing them.  Chronic, progressive lymphedema with associated skin changes, including fibrosis, limits this patent's functional performance in all occupational domains, including functional  ambulation and mobility, basic and instrumental ADLs (lower body dressing, LB bathing, fitting street shoes and LB clothing, driving, shopping, and home management. It also limits perform productive activities, leisure pursuits, and participation in social and community activities. BLE lymphedema contributes to elevated infection risk. Pt will benefit from skilled OT for Complete Decongestive Therapy (CDT), which  typically includes manual lymphatic drainage (MLD), skin care to limit' infection risk and increase skin excursion, lymphatic pumping exercise, and during the Intensive Phase multilayer, gradient compression bandaging to reduce limb volume. Once limb volume is reduced as much as possible, Pt is fitted with appropriate compression garments and/ or devices and transitions into the self management phase of care consisting of follow long support PRN.    Pt understands that her fair prognosis will become poor without daily assistance with compression wrapping since she is unable to reach feet and legs to apply them herself. Pt assures OT that she has a friend she believes is willing to assist her between OT sessions. )   OBJECTIVE IMPAIRMENTS: decreased standing and walking tolerance, activity tolerance, decreased balance, decreased knowledge of condition, decreased knowledge of use of DME, decreased mobility, decreased ROM, decreased strength, increased edema, impaired flexibility, impaired sensation, impaired UE functional use, impaired vision/reception, pain, and chronic, progressive, BLE swelling and associated skin changes, at increased infection risk   ACTIVITY LIMITATIONS: Functional ambulation and mobility (lifting, carrying, steps/stairs, transfers, squatting) , Basic and instrumental ADLs, leisure pursuits, productive activities, social participation  PARTICIPATION LIMITATIONS: meal prep, cleaning, laundry, driving, shopping, yard work, and    PERSONAL FACTORS: Age, Fitness, Past/current  experiences, Time since onset of injury/illness/exacerbation, 2+ co morbidities: OSA, HTN,  are also affecting patient's functional outcome.   REHAB POTENTIAL: Good  EVALUATION COMPLEXITY: Moderate   GOALS: Goals reviewed with patient? Yes  SHORT TERM GOALS: Target date: 4th OT Rx visit   Pt will demonstrate understanding of lymphedema precautions and prevention strategies with modified independence using a printed reference to identify at least 5 precautions and discussing how s/he may implement them into daily life to reduce risk of progression with extra time. Baseline:Max A Goal status: INITIAL  2.  Pt will be able to apply multilayer, knee length, gradient, compression wraps to one leg at a time from toes to below knee with max caregiver assist to decrease  limb volume, to limit infection risk, and to limit lymphedema progression.  Baseline: Dependent Goal status: INITIAL  LONG TERM GOALS: Target date: 09/19/23  1.Given this patient's Intake score of TBA % on the Lymphedema Life Impact Scale (LLIS), patient will experience a reduction of at least 5 points in her perceived level of functional impairment resulting from lymphedema to improve functional performance and quality of life (QOL). Baseline: TBA % Goal status: INITIAL  2.  Pt will achieve at least a 10% volume reduction in B legs to return limb to typical size and shape, to limit infection risk and LE progression, to decrease pain, to improve function. Baseline: Dependent Goal status: INITIAL  3.  Pt will obtain appropriate compression garments/devices and achieve modified independence (extra time + assistive devices) with donning/doffing to optimize limb volume reductions and limit LE progression over time. Baseline: Dependent Goal status: INITIAL  During Intensive phase CDT, with max CG assistance, Pt will achieve at least 85% compliance with all adapted lymphedema self-care home program components, including daily skin  care, compression wraps and /or garments, simple self MLD and lymphatic pumping therex to habituate LE self care protocol  into ADLs for optimal LE self-management over time. Baseline: Dependent Goal status: INITIAL  PLAN:  OT FREQUENCY: 2 x/week  OT DURATION: 12 weeks  PLANNED INTERVENTIONS:compression bandaging, skin care,  97110-Therapeutic exercises, 97530- Therapeutic activity, 97535- Self Care, Manual lymph drainage, DME instructions, and fit with appropriate compression  PLAN FOR NEXT SESSION:  Pt edu for all aspects of LE self-care- bandaging   Arnold Bicker, MS, OTR/L, CLT-LANA 08/02/23 3:48 PM

## 2023-08-04 ENCOUNTER — Encounter: Admitting: Occupational Therapy

## 2023-08-08 ENCOUNTER — Ambulatory Visit: Admitting: Occupational Therapy

## 2023-08-08 DIAGNOSIS — I890 Lymphedema, not elsewhere classified: Secondary | ICD-10-CM

## 2023-08-08 NOTE — Therapy (Addendum)
 OUTPATIENT OCCUPATIONAL THERAPY TREATMENT NOTE  BILATERAL LOWER EXTREMITY LYMPHEDEMA  Patient Name: Judith Arnold MRN: [id number] DOB:1950-12-20, 73 y.o., female Today's Date: 08/08/2023  END OF SESSION:   OT End of Session - 08/08/23 1119     Visit Number 7    Number of Visits 36    Date for OT Re-Evaluation 10/10/23    OT Start Time 1110    OT Stop Time 1215    OT Time Calculation (min) 65 min    Activity Tolerance Patient tolerated treatment well;No increased pain;Other (comment)   Pt unable to reach distal legs and feet. Must have max assist to apply traditional compression wraps.   Behavior During Therapy [facility] for tasks assessed/performed   verbose; needs frequent redirection to complete eval             Past Medical History:  Diagnosis Date   Allergy    Arthritis    lower back, left shoulder   Asthma    Carpal tunnel syndrome on both sides    Dental bridge present    top   Dental crown present    multiple   Family history of adverse reaction to anesthesia    Father and sister - PONV   GERD (gastroesophageal reflux disease)    Glaucoma    Hypertension    Leaky heart valve    Pre-diabetes    Sleep apnea    Past Surgical History:  Procedure Laterality Date   CATARACT EXTRACTION W/PHACO Right 11/16/2016   Procedure: CATARACT EXTRACTION PHACO AND INTRAOCULAR LENS PLACEMENT (IOC)  right;  Surgeon: Annell Kidney, MD;  Location: [facility];  Service: Ophthalmology;  Laterality: Right;  pre diabetic latex sensitivity sleep apnea   CATARACT EXTRACTION W/PHACO Left 01/18/2017   Procedure: CATARACT EXTRACTION PHACO AND INTRAOCULAR LENS PLACEMENT (IOC) LEFT DIABETIC;  Surgeon: Annell Kidney, MD;  Location: [facility];  Service: Ophthalmology;  Laterality: Left;   COLONOSCOPY     COLONOSCOPY WITH PROPOFOL  N/A 04/09/2018   Procedure: COLONOSCOPY WITH PROPOFOL ;  Surgeon: Cassie Click, MD;  Location: [facility];  Service:  Endoscopy;  Laterality: N/A;   IRIDOTOMY / IRIDECTOMY Bilateral 2005   TOOTH EXTRACTION     Patient Active Problem List   Diagnosis Date Noted   Hypertension associated with diabetes (HCC) 01/20/2023   Combined hyperlipidemia associated with type 2 diabetes mellitus (HCC) 01/20/2023   Intertrigo 11/01/2022   Stasis dermatitis of both legs 10/21/2022   Leg swelling 10/21/2022   Lymphedema of lower extremity 10/21/2022   Type 2 diabetes mellitus without complication, without long-term current use of insulin (HCC) 05/24/2022   Mixed hyperlipidemia 05/24/2022   Essential hypertension, benign 05/24/2022   Absolute anemia 05/24/2022   Gastroesophageal reflux disease without esophagitis 05/24/2022   Allergic rhinitis 05/24/2022   Asthma, allergic, moderate persistent, uncomplicated 05/24/2022   Bilateral chronic angle-closure glaucoma, indeterminate stage 05/24/2022   Airway hyperreactivity 08/29/2013   OSA on CPAP 08/29/2013    PCP: Aisha Hove, MD  REFERRING PROVIDER: same  REFERRING DIAG: lymphedema I89.0  THERAPY DIAG:  Lymphedema, not elsewhere classified  Rationale for Evaluation and Treatment: Rehabilitation  ONSET DATE: Pt reports onset of L ankle / leg swelling after injury 20-30 yrs ago. Then about 1 year ago legs started swelling and Pt having prickly sensation on shins and thighs bilaterally.   SUBJECTIVE:  SUBJECTIVE STATEMENT: "Judith Arnold" [place] presents to OT for treatment of BLE lymphedema 2/2 suspected CVI and obesity. Pt is unaccompanied today. Pt reports her friend who is assisting her with compression wraps is "doing a good job". Pt reports pain  in proximal thighs 5/10.   (INITIAL EVAL 07/12/23 : Judith Arnold is referred to Occupational Therapy by Aisha Hove, MD,   for evaluation and treatment of BLE lymphedema.  Pt reports onset of L ankle and distal leg swelling about 30 years ago, then noticed RLE swelling and worsening LLE swelling about 1 year ago at same time she started having unusual sensation in legs. Pt denies previously having lymphedema treatment. Pt reports paternal aunt had leg swelling. Pt is unable to wear compression stockings because she is unable to reach feet and distal legs to don and doff them due to back and hip pain.)  PERTINENT HISTORY: Relevant to lymphedema (OSA (denies using CPAP), BLE stasis dermatitis, HTN, DM 2 (Pt states she is prediabetic only), B glaucoma, Back and R hip OA  PAIN:  Are you having pain? Yes: NPRS scale: 5/10 Pain location: B legs Pain description: like "stickers" Aggravating factors: standing, walking, dependent sitting Relieving factors: elevation  PRECAUTIONS: Fall and Other: LYMPHEDEMA precautions  (DM 2 and asthma)  WEIGHT BEARING RESTRICTIONS: No  FALLS:  Has patient fallen in last 6 months? TBA  LIVING ENVIRONMENT: Lives with: TBA Lives in: House/apartment Stairs: No;  Has following equipment at home: Environmental consultant - 4 wheeled  OCCUPATION: retired Scientist, research (life sciences)  LEISURE: reading and taking notes on current events and politics  HAND DOMINANCE: right   PRIOR LEVEL OF FUNCTION: Independent with household mobility with device, Requires assistive device for independence, Needs assistance with ADLs, Needs assistance with homemaking, and Needs assistance with gait  PATIENT GOALS: reduce swelling and keep it from getting worse  OBJECTIVE: Note: Objective measures were completed at Evaluation unless otherwise noted.  COGNITION:  Overall cognitive status: impaired short term  memory   OBSERVATIONS / OTHER ASSESSMENTS: Mild, Stage II, BLE Lymphedema 2/2 suspected venous insufficiency.   SENSATION: Reports uncomfortable scratching, or sticker-like  sensation on anterior thighs   POSTURE:  Raised walker handles 2 notches to increase upright spinal alignment when walking to limit falls risk. Handles need to be raised at least 1 hole more for safe upright posture when walking.  Upright sitting posture: head forward , shoulders forward and rounded- appears to be flexible kyphosis  LE ROM: WFL for ankles and knees. Pt reports limited hip abduction )  LE MMT: WFL FOR LYMPHEDEMA CARE. Presenting with generalized weakness and Pt reports debility  LYMPHEDEMA ASSESSMENTS:   SURGERY TYPE/DATE: Non-cancer related limb swelling  HX INFECTIONS: positive for 1 episode cellulitis treated w antibiotics  Hx WOUNDS: denies   BLE COMPARATIVE LIMB VOLUMETRICS: INITIAL 07/19/23  LANDMARK RIGHT (dominant)  R LEG (A-D) 3525.1 ml  R THIGH (E-G) ml  R FULL LIMB (A-G) ml  Limb Volume differential (LVD)  %  Volume change since initial %  Volume change overall V  (Blank rows = not tested)  LANDMARK LEFT    L LEG (A-D) 3769.6 ml  L THIGH (E-G) ml  L FULL LIMB (A-G) ml  Limb Volume differential (LVD)  Limb Volume Differential (LVD) measures 6.5%, L>R.  Volume change since initial %  Volume change overall %    Mild, Stage  II, Bilateral Lower Extremity Lymphedema 2/2 CVI and Obesity  Skin  Description Hyper-Keratosis Peau' de Orange Shiny  Tight Fibrotic/ Indurated Fatty Doughy Spongy/ boggy       R>L x  x   Skin dry Flaky WNL Macerated   mildly      Color Redness Varicosities Blanching Hemosiderin Stain Mottled   x     x   Odor Malodorous Yeast Fungal infection  WNL      x   Temperature Warm Cool wnl    x     Pitting Edema   1+ 2+ 3+ 4+ Non-pitting         x   Girth Symmetrical Asymmetrical                   Distribution    R>L toes to groin    Stemmer Sign Positive Negative   +    Lymphorrhea History Of:  Present Absent     x    Wounds History Of Present Absent Venous Arterial Pressure Sheer     x        Signs of Infection Redness Warmth Erythema Acute  Swelling Drainage Borders                    Sensation Light Touch Deep pressure Hypersensitivity   Present Impaired Present Impaired Absent Impaired   x Tactile  x  x     Nails WNL   Fungus nail dystrophy   x     Hair Growth Symmetrical Asymmetrical   x    Skin Creases Base of toes  Ankles   Base of Fingers knees       Abdominal pannus Thigh Lobules  Face/neck   x x  x      (Blank rows = not tested)    GAIT: Distance walked: >500' Assistive device utilized: Environmental consultant - 4 wheeled Level of assistance: Modified independence Comments: Pt bent at waist nearly 90 degrees with walker handles in lowest position  LYMPHEDEMA LIFE IMPACT SCALE (LLIS): TBA OT Rx visit 1  TREATMENT DATE:  RLE/RLQ MLD with simultaneous skin care as established RLE multilayer compression bandaging Pt edu for lymphedema self-care throughout session    PATIENT EDUCATION:  Continued Pt/ CG edu for lymphedema self care home program throughout session. Topics include outcome of comparative limb volumetrics- starting limb volume differentials (LVDs), technology and gradient techniques used for short stretch, multilayer compression wrapping, simple self-MLD, therapeutic lymphatic pumping exercises, skin/nail care, LE precautions, compression garment recommendations and specifications, wear and care schedule and compression garment donning / doffing w assistive devices. Discussed progress towards all OT goals since commencing CDT. Discussed detrimental impact of obesity on lower and upper extremity lymphedema over time. Reviewed OT goals for lymphedema care with Pt and discussed progress to date.  All questions answered to the Pt's satisfaction. Good return. Person educated: Patient  Education method: Explanation, Demonstration, and Handouts Education comprehension: verbalized understanding, returned demonstration, verbal cues required, and needs further education   HOME EXERCISE PROGRAM: BLE lymphatic pumping  there ex using- 1 sets of 10 reps, each exercise in order-  1-2 x daily, bilaterally Simple self MLD 1 x daily Daily skin care to increase hydration, skin mobility and decrease infection risk- can be done during MLD During Intensive Phase CDT: Compression wraps 23/7 until limb volume reduction complete During Self-management Phase CDT: Fit with appropriate compression garments or alternatives. Consider BLE, knee length, Mediven, custom, CircAid , Velcro style leggings over soft cotton liners.  ASSESSMENT: CLINICAL IMPRESSION: Continued MLD to RLE/RLQ utilizing functional inguinal LN, deep abdominal pathways  and modified short neck sequence to avoid thyroid region (No strokes to lateral neck and very gentle strokes to R and L of sternal notch over terminus. Pt tolerated manual therapy without increased pain. Provided skin care to limit infection risk using castor oil to increase skin excursion during MLD. Pt educated throughout session on lymphedema self care. Reapplied multilayer, knee length compression wraps, adding Artiflex to change shape at distal leg to reduce compression. Stasis dermatitis on anterior R leg appears  darker red today. Wraps may be exacerbating it. Reviewed bandaging reviewing simultaneously with wrapping. Cont as per POC.  (INITIAL EVAL 07/12/23: Judith Arnold is a 73 yo female presenting with chronic, progressive, BLE swelling 2/2 unknown etiology, which at first glance appears to be circulatory in nature due to skin coloring at distal legs where swelling is most invested. Pt is presently unable to wear traditional elastic compression garments due to difficulty donning and doffing them.  Chronic, progressive lymphedema with associated skin changes, including fibrosis, limits this patent's functional performance in all occupational domains, including functional ambulation and mobility, basic and instrumental ADLs (lower body dressing, LB bathing, fitting street shoes and LB  clothing, driving, shopping, and home management. It also limits perform productive activities, leisure pursuits, and participation in social and community activities. BLE lymphedema contributes to elevated infection risk. Pt will benefit from skilled OT for Complete Decongestive Therapy (CDT), which  typically includes manual lymphatic drainage (MLD), skin care to limit' infection risk and increase skin excursion, lymphatic pumping exercise, and during the Intensive Phase multilayer, gradient compression bandaging to reduce limb volume. Once limb volume is reduced as much as possible, Pt is fitted with appropriate compression garments and/ or devices and transitions into the self management phase of care consisting of follow long support PRN.    Pt understands that her fair prognosis will become poor without daily assistance with compression wrapping since she is unable to reach feet and legs to apply them herself. Pt assures OT that she has a friend she believes is willing to assist her between OT sessions. )   OBJECTIVE IMPAIRMENTS: decreased standing and walking tolerance, activity tolerance, decreased balance, decreased knowledge of condition, decreased knowledge of use of DME, decreased mobility, decreased ROM, decreased strength, increased edema, impaired flexibility, impaired sensation, impaired UE functional use, impaired vision/reception, pain, and chronic, progressive, BLE swelling and associated skin changes, at increased infection risk   ACTIVITY LIMITATIONS: Functional ambulation and mobility (lifting, carrying, steps/stairs, transfers, squatting) , Basic and instrumental ADLs, leisure pursuits, productive activities, social participation  PARTICIPATION LIMITATIONS: meal prep, cleaning, laundry, driving, shopping, yard work, and    PERSONAL FACTORS: Age, Fitness, Past/current experiences, Time since onset of injury/illness/exacerbation, 2+ co morbidities: OSA, HTN,  are also affecting  patient's functional outcome.   REHAB POTENTIAL: Good  EVALUATION COMPLEXITY: Moderate   GOALS: Goals reviewed with patient? Yes  SHORT TERM GOALS: Target date: 4th OT Rx visit   Pt will demonstrate understanding of lymphedema precautions and prevention strategies with modified independence using a printed reference to identify at least 5 precautions and discussing how s/he may implement them into daily life to reduce risk of progression with extra time. Baseline:Max A Goal status: INITIAL  2.  Pt will be able to apply multilayer, knee length, gradient, compression wraps to one leg at a time from toes to below knee with max caregiver assist to decrease limb volume, to limit infection risk, and to limit lymphedema progression.  Baseline: Dependent Goal status: INITIAL  LONG TERM GOALS: Target date: 09/19/23  1.Given this patient's Intake score of TBA % on the Lymphedema Life Impact Scale (LLIS), patient will experience a reduction of at least 5 points in her perceived level of functional impairment resulting from lymphedema to improve functional performance and quality of life (QOL). Baseline: TBA % Goal status: INITIAL  2.  Pt will achieve at least a 10% volume reduction in B legs to return limb to typical size and shape, to limit infection risk and LE progression, to decrease pain, to improve function. Baseline: Dependent Goal status: INITIAL  3.  Pt will obtain appropriate compression garments/devices and achieve modified independence (extra time + assistive devices) with donning/doffing to optimize limb volume reductions and limit LE progression over time. Baseline: Dependent Goal status: INITIAL  During Intensive phase CDT, with max CG assistance, Pt will achieve at least 85% compliance with all adapted lymphedema self-care home program components, including daily skin care, compression wraps and /or garments, simple self MLD and lymphatic pumping therex to habituate LE self care  protocol  into ADLs for optimal LE self-management over time. Baseline: Dependent Goal status: INITIAL  PLAN:  OT FREQUENCY: 2 x/week  OT DURATION: 12 weeks  PLANNED INTERVENTIONS:compression bandaging, skin care,  97110-Therapeutic exercises, 97530- Therapeutic activity, 97535- Self Care, Manual lymph drainage, DME instructions, and fit with appropriate compression  PLAN FOR NEXT SESSION:  Pt edu for all aspects of LE self-care- bandaging   Arnold Bicker, MS, OTR/L, CLT-LANA 08/08/23 1:21 PM

## 2023-08-11 ENCOUNTER — Telehealth: Payer: Self-pay | Admitting: Internal Medicine

## 2023-08-11 ENCOUNTER — Ambulatory Visit: Admitting: Occupational Therapy

## 2023-08-11 ENCOUNTER — Ambulatory Visit (INDEPENDENT_AMBULATORY_CARE_PROVIDER_SITE_OTHER): Admitting: Internal Medicine

## 2023-08-11 VITALS — BP 109/55

## 2023-08-11 DIAGNOSIS — I890 Lymphedema, not elsewhere classified: Secondary | ICD-10-CM

## 2023-08-11 DIAGNOSIS — I152 Hypertension secondary to endocrine disorders: Secondary | ICD-10-CM | POA: Diagnosis not present

## 2023-08-11 DIAGNOSIS — E1159 Type 2 diabetes mellitus with other circulatory complications: Secondary | ICD-10-CM | POA: Diagnosis not present

## 2023-08-11 NOTE — Telephone Encounter (Signed)
 Patient came by for a BP check to compare to her wrist monitor. Her monitor was not far off from my manual readings. Only about 5 on top and 5 on bottom from being off. Here are some of her recent readings from home: 87/48, 85/45, 89/40. She reports dizziness and some arm weakness. Please advise on what to do.

## 2023-08-11 NOTE — Therapy (Signed)
 OUTPATIENT OCCUPATIONAL THERAPY TREATMENT NOTE  BILATERAL LOWER EXTREMITY LYMPHEDEMA  Patient Name: Judith Arnold MRN: [id number] DOB:03-Feb-1951, 73 y.o., female Today's Date: 08/11/2023  END OF SESSION:   OT End of Session - 08/11/23 1112     Visit Number 8    Number of Visits 36    Date for OT Re-Evaluation 10/10/23    OT Start Time 1102    OT Stop Time 1202    OT Time Calculation (min) 60 min    Activity Tolerance Patient tolerated treatment well;No increased pain;Other (comment)   Pt unable to reach distal legs and feet. Must have max assist to apply traditional compression wraps.   Behavior During Therapy [facility] for tasks assessed/performed   verbose; needs frequent redirection to complete eval             Past Medical History:  Diagnosis Date   Allergy    Arthritis    lower back, left shoulder   Asthma    Carpal tunnel syndrome on both sides    Dental bridge present    top   Dental crown present    multiple   Family history of adverse reaction to anesthesia    Father and sister - PONV   GERD (gastroesophageal reflux disease)    Glaucoma    Hypertension    Leaky heart valve    Pre-diabetes    Sleep apnea    Past Surgical History:  Procedure Laterality Date   CATARACT EXTRACTION W/PHACO Right 11/16/2016   Procedure: CATARACT EXTRACTION PHACO AND INTRAOCULAR LENS PLACEMENT (IOC)  right;  Surgeon: Annell Kidney, MD;  Location: [facility];  Service: Ophthalmology;  Laterality: Right;  pre diabetic latex sensitivity sleep apnea   CATARACT EXTRACTION W/PHACO Left 01/18/2017   Procedure: CATARACT EXTRACTION PHACO AND INTRAOCULAR LENS PLACEMENT (IOC) LEFT DIABETIC;  Surgeon: Annell Kidney, MD;  Location: [facility];  Service: Ophthalmology;  Laterality: Left;   COLONOSCOPY     COLONOSCOPY WITH PROPOFOL  N/A 04/09/2018   Procedure: COLONOSCOPY WITH PROPOFOL ;  Surgeon: Cassie Click, MD;  Location: [facility];  Service:  Endoscopy;  Laterality: N/A;   IRIDOTOMY / IRIDECTOMY Bilateral 2005   TOOTH EXTRACTION     Patient Active Problem List   Diagnosis Date Noted   Hypertension associated with diabetes (HCC) 01/20/2023   Combined hyperlipidemia associated with type 2 diabetes mellitus (HCC) 01/20/2023   Intertrigo 11/01/2022   Stasis dermatitis of both legs 10/21/2022   Leg swelling 10/21/2022   Lymphedema of lower extremity 10/21/2022   Type 2 diabetes mellitus without complication, without long-term current use of insulin (HCC) 05/24/2022   Mixed hyperlipidemia 05/24/2022   Essential hypertension, benign 05/24/2022   Absolute anemia 05/24/2022   Gastroesophageal reflux disease without esophagitis 05/24/2022   Allergic rhinitis 05/24/2022   Asthma, allergic, moderate persistent, uncomplicated 05/24/2022   Bilateral chronic angle-closure glaucoma, indeterminate stage 05/24/2022   Airway hyperreactivity 08/29/2013   OSA on CPAP 08/29/2013    PCP: Aisha Hove, MD  REFERRING PROVIDER: same  REFERRING DIAG: lymphedema I89.0  THERAPY DIAG:  Lymphedema, not elsewhere classified  Rationale for Evaluation and Treatment: Rehabilitation  ONSET DATE: Pt reports onset of L ankle / leg swelling after injury 20-30 yrs ago. Then about 1 year ago legs started swelling and Pt having prickly sensation on shins and thighs bilaterally.   SUBJECTIVE:  SUBJECTIVE STATEMENT: "Judith Kehr" Arnold presents to OT for treatment of BLE lymphedema 2/2 suspected CVI and obesity. Pt is unaccompanied today. Pt reports her friend who is assisting her with compression wraps is "doing a good job". Pt reports pain  in proximal thighs 5/10.Pt reports episode of low blood pressure during Rx interval. Pt notified doctor.   (INITIAL EVAL 07/12/23 :  Judith Arnold is referred to Occupational Therapy by Aisha Hove, MD,  for evaluation and treatment of BLE lymphedema.  Pt reports onset of L ankle and distal leg swelling about 30 years ago, then noticed RLE swelling and worsening LLE swelling about 1 year ago at same time she started having unusual sensation in legs. Pt denies previously having lymphedema treatment. Pt reports paternal aunt had leg swelling. Pt is unable to wear compression stockings because she is unable to reach feet and distal legs to don and doff them due to back and hip pain.)  PERTINENT HISTORY: Relevant to lymphedema (OSA (denies using CPAP), BLE stasis dermatitis, HTN, DM 2 (Pt states she is prediabetic only), B glaucoma, Back and R hip OA  PAIN:  Are you having pain? Yes: NPRS scale: 5/10 Pain location: B legs Pain description: like "stickers" Aggravating factors: standing, walking, dependent sitting Relieving factors: elevation  PRECAUTIONS: Fall and Other: LYMPHEDEMA precautions  (DM 2 and asthma)  WEIGHT BEARING RESTRICTIONS: No  FALLS:  Has patient fallen in last 6 months? TBA  LIVING ENVIRONMENT: Lives with: TBA Lives in: House/apartment Stairs: No;  Has following equipment at home: Environmental consultant - 4 wheeled  OCCUPATION: retired Scientist, research (life sciences)  LEISURE: reading and taking notes on current events and politics  HAND DOMINANCE: right   PRIOR LEVEL OF FUNCTION: Independent with household mobility with device, Requires assistive device for independence, Needs assistance with ADLs, Needs assistance with homemaking, and Needs assistance with gait  PATIENT GOALS: reduce swelling and keep it from getting worse  OBJECTIVE: Note: Objective measures were completed at Evaluation unless otherwise noted.  COGNITION:  Overall cognitive status: impaired short term  memory   OBSERVATIONS / OTHER ASSESSMENTS: Mild, Stage II, BLE Lymphedema 2/2 suspected venous insufficiency.   SENSATION: Reports  uncomfortable scratching, or sticker-like  sensation on anterior thighs   POSTURE: Raised walker handles 2 notches to increase upright spinal alignment when walking to limit falls risk. Handles need to be raised at least 1 hole more for safe upright posture when walking.  Upright sitting posture: head forward , shoulders forward and rounded- appears to be flexible kyphosis  LE ROM: WFL for ankles and knees. Pt reports limited hip abduction )  LE MMT: WFL FOR LYMPHEDEMA CARE. Presenting with generalized weakness and Pt reports debility  LYMPHEDEMA ASSESSMENTS:   SURGERY TYPE/DATE: Non-cancer related limb swelling  HX INFECTIONS: positive for 1 episode cellulitis treated w antibiotics  Hx WOUNDS: denies   BLE COMPARATIVE LIMB VOLUMETRICS: INITIAL 07/19/23  LANDMARK RIGHT (dominant)  R LEG (A-D) 3525.1 ml  R THIGH (E-G) ml  R FULL LIMB (A-G) ml  Limb Volume differential (LVD)  %  Volume change since initial %  Volume change overall V  (Blank rows = not tested)  LANDMARK LEFT    L LEG (A-D) 3769.6 ml  L THIGH (E-G) ml  L FULL LIMB (A-G) ml  Limb Volume differential (LVD)  Limb Volume Differential (LVD) measures 6.5%, L>R.  Volume change since initial %  Volume change overall %    Mild, Stage  II, Bilateral Lower Extremity Lymphedema 2/2  CVI and Obesity  Skin  Description Hyper-Keratosis Peau' de Orange Shiny Tight Fibrotic/ Indurated Fatty Doughy Spongy/ boggy       R>L x  x   Skin dry Flaky WNL Macerated   mildly      Color Redness Varicosities Blanching Hemosiderin Stain Mottled   x     x   Odor Malodorous Yeast Fungal infection  WNL      x   Temperature Warm Cool wnl    x     Pitting Edema   1+ 2+ 3+ 4+ Non-pitting         x   Girth Symmetrical Asymmetrical                   Distribution    R>L toes to groin    Stemmer Sign Positive Negative   +    Lymphorrhea History Of:  Present Absent     x    Wounds History Of Present Absent Venous  Arterial Pressure Sheer     x        Signs of Infection Redness Warmth Erythema Acute Swelling Drainage Borders                    Sensation Light Touch Deep pressure Hypersensitivity   Present Impaired Present Impaired Absent Impaired   x Tactile  x  x     Nails WNL   Fungus nail dystrophy   x     Hair Growth Symmetrical Asymmetrical   x    Skin Creases Base of toes  Ankles   Base of Fingers knees       Abdominal pannus Thigh Lobules  Face/neck   x x  x      (Blank rows = not tested)    GAIT: Distance walked: >500' Assistive device utilized: Environmental consultant - 4 wheeled Level of assistance: Modified independence Comments: Pt bent at waist nearly 90 degrees with walker handles in lowest position  LYMPHEDEMA LIFE IMPACT SCALE (LLIS): TBA OT Rx visit 1  TREATMENT DATE:  RLE/RLQ MLD with simultaneous skin care as established RLE multilayer compression bandaging Pt edu for lymphedema self-care throughout session    PATIENT EDUCATION:  Continued Pt/ CG edu for lymphedema self care home program throughout session. Topics include outcome of comparative limb volumetrics- starting limb volume differentials (LVDs), technology and gradient techniques used for short stretch, multilayer compression wrapping, simple self-MLD, therapeutic lymphatic pumping exercises, skin/nail care, LE precautions, compression garment recommendations and specifications, wear and care schedule and compression garment donning / doffing w assistive devices. Discussed progress towards all OT goals since commencing CDT. Discussed detrimental impact of obesity on lower and upper extremity lymphedema over time. Reviewed OT goals for lymphedema care with Pt and discussed progress to date.  All questions answered to the Pt's satisfaction. Good return. Person educated: Patient  Education method: Explanation, Demonstration, and Handouts Education comprehension: verbalized understanding, returned demonstration, verbal  cues required, and needs further education   HOME EXERCISE PROGRAM: BLE lymphatic pumping there ex using- 1 sets of 10 reps, each exercise in order-  1-2 x daily, bilaterally Simple self MLD 1 x daily Daily skin care to increase hydration, skin mobility and decrease infection risk- can be done during MLD During Intensive Phase CDT: Compression wraps 23/7 until limb volume reduction complete During Self-management Phase CDT: Fit with appropriate compression garments or alternatives. Consider BLE, knee length, Mediven, custom, CircAid , Velcro style leggings over soft cotton liners.  ASSESSMENT: CLINICAL  IMPRESSION: Continued MLD to RLE/RLQ utilizing functional inguinal LN, deep abdominal pathways and modified short neck sequence to avoid thyroid region (No strokes to lateral neck and very gentle strokes to R and L of sternal notch over terminus. Pt tolerated manual therapy without increased pain. Provided skin care to limit infection risk using castor oil to increase skin excursion during MLD. Pt educated throughout session on lymphedema self care. Reapplied multilayer, knee length compression wraps, adding Artiflex to change shape at distal leg to reduce compression. Stasis dermatitis on anterior R leg appears  darker red today. Wraps may be exacerbating it. Reviewed bandaging reviewing simultaneously with wrapping. Cont as per POC.  (INITIAL EVAL 07/12/23: Judith Arnold is a 73 yo female presenting with chronic, progressive, BLE swelling 2/2 unknown etiology, which at first glance appears to be circulatory in nature due to skin coloring at distal legs where swelling is most invested. Pt is presently unable to wear traditional elastic compression garments due to difficulty donning and doffing them.  Chronic, progressive lymphedema with associated skin changes, including fibrosis, limits this patent's functional performance in all occupational domains, including functional ambulation and  mobility, basic and instrumental ADLs (lower body dressing, LB bathing, fitting street shoes and LB clothing, driving, shopping, and home management. It also limits perform productive activities, leisure pursuits, and participation in social and community activities. BLE lymphedema contributes to elevated infection risk. Pt will benefit from skilled OT for Complete Decongestive Therapy (CDT), which  typically includes manual lymphatic drainage (MLD), skin care to limit' infection risk and increase skin excursion, lymphatic pumping exercise, and during the Intensive Phase multilayer, gradient compression bandaging to reduce limb volume. Once limb volume is reduced as much as possible, Pt is fitted with appropriate compression garments and/ or devices and transitions into the self management phase of care consisting of follow long support PRN.    Pt understands that her fair prognosis will become poor without daily assistance with compression wrapping since she is unable to reach feet and legs to apply them herself. Pt assures OT that she has a friend she believes is willing to assist her between OT sessions. )   OBJECTIVE IMPAIRMENTS: decreased standing and walking tolerance, activity tolerance, decreased balance, decreased knowledge of condition, decreased knowledge of use of DME, decreased mobility, decreased ROM, decreased strength, increased edema, impaired flexibility, impaired sensation, impaired UE functional use, impaired vision/reception, pain, and chronic, progressive, BLE swelling and associated skin changes, at increased infection risk   ACTIVITY LIMITATIONS: Functional ambulation and mobility (lifting, carrying, steps/stairs, transfers, squatting) , Basic and instrumental ADLs, leisure pursuits, productive activities, social participation  PARTICIPATION LIMITATIONS: meal prep, cleaning, laundry, driving, shopping, yard work, and    PERSONAL FACTORS: Age, Fitness, Past/current experiences, Time  since onset of injury/illness/exacerbation, 2+ co morbidities: OSA, HTN,  are also affecting patient's functional outcome.   REHAB POTENTIAL: Good  EVALUATION COMPLEXITY: Moderate   GOALS: Goals reviewed with patient? Yes  SHORT TERM GOALS: Target date: 4th OT Rx visit   Pt will demonstrate understanding of lymphedema precautions and prevention strategies with modified independence using a printed reference to identify at least 5 precautions and discussing how s/he may implement them into daily life to reduce risk of progression with extra time. Baseline:Max A Goal status: INITIAL  2.  Pt will be able to apply multilayer, knee length, gradient, compression wraps to one leg at a time from toes to below knee with max caregiver assist to decrease limb volume, to limit infection risk,  and to limit lymphedema progression.  Baseline: Dependent Goal status: INITIAL  LONG TERM GOALS: Target date: 09/19/23  1.Given this patient's Intake score of TBA % on the Lymphedema Life Impact Scale (LLIS), patient will experience a reduction of at least 5 points in her perceived level of functional impairment resulting from lymphedema to improve functional performance and quality of life (QOL). Baseline: TBA % Goal status: INITIAL  2.  Pt will achieve at least a 10% volume reduction in B legs to return limb to typical size and shape, to limit infection risk and LE progression, to decrease pain, to improve function. Baseline: Dependent Goal status: INITIAL  3.  Pt will obtain appropriate compression garments/devices and achieve modified independence (extra time + assistive devices) with donning/doffing to optimize limb volume reductions and limit LE progression over time. Baseline: Dependent Goal status: INITIAL  During Intensive phase CDT, with max CG assistance, Pt will achieve at least 85% compliance with all adapted lymphedema self-care home program components, including daily skin care, compression  wraps and /or garments, simple self MLD and lymphatic pumping therex to habituate LE self care protocol  into ADLs for optimal LE self-management over time. Baseline: Dependent Goal status: INITIAL  PLAN:  OT FREQUENCY: 2 x/week  OT DURATION: 12 weeks  PLANNED INTERVENTIONS:compression bandaging, skin care,  97110-Therapeutic exercises, 97530- Therapeutic activity, 97535- Self Care, Manual lymph drainage, DME instructions, and fit with appropriate compression  PLAN FOR NEXT SESSION:  Pt edu for all aspects of LE self-care- bandaging   Arnold Bicker, MS, OTR/L, CLT-LANA 08/11/23 11:59 AM

## 2023-08-11 NOTE — Telephone Encounter (Signed)
 Patient left VM that her BP dropped yesterday and she had to go lay down. She wants to come by today and get a BP check to compare to her wrist monitor that she uses at home.

## 2023-08-14 ENCOUNTER — Encounter: Payer: Self-pay | Admitting: Internal Medicine

## 2023-08-14 NOTE — Progress Notes (Signed)
 Patient here for BP check

## 2023-08-15 ENCOUNTER — Ambulatory Visit: Attending: Internal Medicine | Admitting: Occupational Therapy

## 2023-08-15 DIAGNOSIS — I890 Lymphedema, not elsewhere classified: Secondary | ICD-10-CM | POA: Diagnosis not present

## 2023-08-15 NOTE — Therapy (Signed)
 OUTPATIENT OCCUPATIONAL THERAPY TREATMENT NOTE  BILATERAL LOWER EXTREMITY LYMPHEDEMA  Patient Name: Judith Arnold MRN: [id number] DOB:Nov 08, 1950, 73 y.o., female Today's Date: 08/15/2023  REPORTING PERIOD:  07/12/23 -   END OF SESSION:   OT End of Session - 08/15/23 1301     Visit Number 9    Number of Visits 36    Date for OT Re-Evaluation 10/10/23    OT Start Time 0102    Activity Tolerance Patient tolerated treatment well;No increased pain;Other (comment)   Pt unable to reach distal legs and feet. Must have max assist to apply traditional compression wraps.   Behavior During Therapy [facility] for tasks assessed/performed   verbose; needs frequent redirection to complete eval             Past Medical History:  Diagnosis Date   Allergy    Arthritis    lower back, left shoulder   Asthma    Carpal tunnel syndrome on both sides    Dental bridge present    top   Dental crown present    multiple   Family history of adverse reaction to anesthesia    Father and sister - PONV   GERD (gastroesophageal reflux disease)    Glaucoma    Hypertension    Leaky heart valve    Pre-diabetes    Sleep apnea    Past Surgical History:  Procedure Laterality Date   CATARACT EXTRACTION W/PHACO Right 11/16/2016   Procedure: CATARACT EXTRACTION PHACO AND INTRAOCULAR LENS PLACEMENT (IOC)  right;  Surgeon: Annell Kidney, MD;  Location: [facility];  Service: Ophthalmology;  Laterality: Right;  pre diabetic latex sensitivity sleep apnea   CATARACT EXTRACTION W/PHACO Left 01/18/2017   Procedure: CATARACT EXTRACTION PHACO AND INTRAOCULAR LENS PLACEMENT (IOC) LEFT DIABETIC;  Surgeon: Annell Kidney, MD;  Location: [facility];  Service: Ophthalmology;  Laterality: Left;   COLONOSCOPY     COLONOSCOPY WITH PROPOFOL  N/A 04/09/2018   Procedure: COLONOSCOPY WITH PROPOFOL ;  Surgeon: Cassie Click, MD;  Location: [facility];  Service: Endoscopy;  Laterality: N/A;    IRIDOTOMY / IRIDECTOMY Bilateral 2005   TOOTH EXTRACTION     Patient Active Problem List   Diagnosis Date Noted   Hypertension associated with diabetes (HCC) 01/20/2023   Combined hyperlipidemia associated with type 2 diabetes mellitus (HCC) 01/20/2023   Intertrigo 11/01/2022   Stasis dermatitis of both legs 10/21/2022   Leg swelling 10/21/2022   Lymphedema of lower extremity 10/21/2022   Type 2 diabetes mellitus without complication, without long-term current use of insulin (HCC) 05/24/2022   Mixed hyperlipidemia 05/24/2022   Essential hypertension, benign 05/24/2022   Absolute anemia 05/24/2022   Gastroesophageal reflux disease without esophagitis 05/24/2022   Allergic rhinitis 05/24/2022   Asthma, allergic, moderate persistent, uncomplicated 05/24/2022   Bilateral chronic angle-closure glaucoma, indeterminate stage 05/24/2022   Airway hyperreactivity 08/29/2013   OSA on CPAP 08/29/2013    PCP: Aisha Hove, MD  REFERRING PROVIDER: same  REFERRING DIAG: lymphedema I89.0  THERAPY DIAG:  Lymphedema, not elsewhere classified  Rationale for Evaluation and Treatment: Rehabilitation  ONSET DATE: Pt reports onset of L ankle / leg swelling after injury 20-30 yrs ago. Then about 1 year ago legs started swelling and Pt having prickly sensation on shins and thighs bilaterally.   SUBJECTIVE:  SUBJECTIVE STATEMENT: "Judith Arnold" [name] presents to OT for treatment of BLE lymphedema 2/2 suspected CVI and obesity. Pt is unaccompanied today. Pt reports pain in proximal thighs 5/10. Pt reports her helper did not come to assist w wrap on Sunday, but she did come yesterday to assist. Pt has no new concerns.   (INITIAL EVAL 07/12/23 : Judith Arnold is referred to Occupational Therapy by Aisha Hove, MD,   for evaluation and treatment of BLE lymphedema.  Pt reports onset of L ankle and distal leg swelling about 30 years ago, then noticed RLE swelling and worsening LLE swelling about 1 year ago at same time she started having unusual sensation in legs. Pt denies previously having lymphedema treatment. Pt reports paternal aunt had leg swelling. Pt is unable to wear compression stockings because she is unable to reach feet and distal legs to don and doff them due to back and hip pain.)  PERTINENT HISTORY: Relevant to lymphedema (OSA (denies using CPAP), BLE stasis dermatitis, HTN, DM 2 (Pt states she is prediabetic only), B glaucoma, Back and R hip OA  PAIN:  Are you having pain? Yes: NPRS scale: 5/10 Pain location: B legs Pain description: like "stickers" Aggravating factors: standing, walking, dependent sitting Relieving factors: elevation  PRECAUTIONS: Fall and Other: LYMPHEDEMA precautions  (DM 2 and asthma)  WEIGHT BEARING RESTRICTIONS: No  FALLS:  Has patient fallen in last 6 months? TBA  LIVING ENVIRONMENT: Lives with: TBA Lives in: House/apartment Stairs: No;  Has following equipment at home: Environmental consultant - 4 wheeled  OCCUPATION: retired Scientist, research (life sciences)  LEISURE: reading and taking notes on current events and politics  HAND DOMINANCE: right   PRIOR LEVEL OF FUNCTION: Independent with household mobility with device, Requires assistive device for independence, Needs assistance with ADLs, Needs assistance with homemaking, and Needs assistance with gait  PATIENT GOALS: reduce swelling and keep it from getting worse  OBJECTIVE: Note: Objective measures were completed at Evaluation unless otherwise noted.  COGNITION:  Overall cognitive status: impaired short term  memory   OBSERVATIONS / OTHER ASSESSMENTS: Mild, Stage II, BLE Lymphedema 2/2 suspected venous insufficiency.   SENSATION: Reports uncomfortable scratching, or sticker-like  sensation on anterior thighs   POSTURE:  Raised walker handles 2 notches to increase upright spinal alignment when walking to limit falls risk. Handles need to be raised at least 1 hole more for safe upright posture when walking.  Upright sitting posture: head forward , shoulders forward and rounded- appears to be flexible kyphosis  LE ROM: WFL for ankles and knees. Pt reports limited hip abduction )  LE MMT: WFL FOR LYMPHEDEMA CARE. Presenting with generalized weakness and Pt reports debility  LYMPHEDEMA ASSESSMENTS:   SURGERY TYPE/DATE: Non-cancer related limb swelling  HX INFECTIONS: positive for 1 episode cellulitis treated w antibiotics  Hx WOUNDS: denies   BLE COMPARATIVE LIMB VOLUMETRICS: INITIAL 07/19/23  LANDMARK RIGHT (dominant)  R LEG (A-D) 3525.1 ml  R THIGH (E-G) ml  R FULL LIMB (A-G) ml  Limb Volume differential (LVD)  %  Volume change since initial %  Volume change overall V  (Blank rows = not tested)  LANDMARK LEFT    L LEG (A-D) 3769.6 ml  L THIGH (E-G) ml  L FULL LIMB (A-G) ml  Limb Volume differential (LVD)  Limb Volume Differential (LVD) measures 6.5%, L>R.  Volume change since initial %  Volume change overall %    RLE COMPARATIVE LIMB VOLUMETRICS: VISIT 9 08/15/23  LANDMARK RIGHT (dominant)  R  LEG (A-D) 3549.4 ml  R THIGH (E-G) ml  R FULL LIMB (A-G) ml  Limb Volume differential (LVD)  %  Volume change since initial R LEG volume reduced by 5.84% since initially measured on 07/18/21  Volume change overall V  (Blank rows = not tested)  Mild, Stage  II, Bilateral Lower Extremity Lymphedema 2/2 CVI and Obesity  Skin  Description Hyper-Keratosis Peau' de Orange Shiny Tight Fibrotic/ Indurated Fatty Doughy Spongy/ boggy       R>L x  x   Skin dry Flaky WNL Macerated   mildly      Color Redness Varicosities Blanching Hemosiderin Stain Mottled   x     x   Odor Malodorous Yeast Fungal infection  WNL      x   Temperature Warm Cool wnl    x     Pitting Edema   1+ 2+ 3+ 4+ Non-pitting          x   Girth Symmetrical Asymmetrical                   Distribution    R>L toes to groin    Stemmer Sign Positive Negative   +    Lymphorrhea History Of:  Present Absent     x    Wounds History Of Present Absent Venous Arterial Pressure Sheer     x        Signs of Infection Redness Warmth Erythema Acute Swelling Drainage Borders                    Sensation Light Touch Deep pressure Hypersensitivity   Present Impaired Present Impaired Absent Impaired   x Tactile  x  x     Nails WNL   Fungus nail dystrophy   x     Hair Growth Symmetrical Asymmetrical   x    Skin Creases Base of toes  Ankles   Base of Fingers knees       Abdominal pannus Thigh Lobules  Face/neck   x x  x      (Blank rows = not tested)    GAIT: Distance walked: >500' Assistive device utilized: Environmental consultant - 4 wheeled Level of assistance: Modified independence Comments: Pt bent at waist nearly 90 degrees with walker handles in lowest position  LYMPHEDEMA LIFE IMPACT SCALE (LLIS): TBA OT Rx visit 1  TREATMENT DATE:  Comparative limb volumetrics RLE/RLQ MLD with simultaneous skin care as established RLE multilayer compression bandaging Pt edu for lymphedema self-care throughout session    PATIENT EDUCATION:  Continued Pt/ CG edu for lymphedema self care home program throughout session. Topics include outcome of comparative limb volumetrics- starting limb volume differentials (LVDs), technology and gradient techniques used for short stretch, multilayer compression wrapping, simple self-MLD, therapeutic lymphatic pumping exercises, skin/nail care, LE precautions, compression garment recommendations and specifications, wear and care schedule and compression garment donning / doffing w assistive devices. Discussed progress towards all OT goals since commencing CDT. Discussed detrimental impact of obesity on lower and upper extremity lymphedema over time. Reviewed OT goals for lymphedema care with  Pt and discussed progress to date.  All questions answered to the Pt's satisfaction. Good return. Person educated: Patient  Education method: Explanation, Demonstration, and Handouts Education comprehension: verbalized understanding, returned demonstration, verbal cues required, and needs further education   HOME EXERCISE PROGRAM: BLE lymphatic pumping there ex using- 1 sets of 10 reps, each exercise in order-  1-2 x daily, bilaterally Simple self  MLD 1 x daily Daily skin care to increase hydration, skin mobility and decrease infection risk- can be done during MLD During Intensive Phase CDT: Compression wraps 23/7 until limb volume reduction complete During Self-management Phase CDT: Fit with appropriate compression garments or alternatives. Consider BLE, knee length, Mediven, custom, CircAid , Velcro style leggings over soft cotton liners.  ASSESSMENT: CLINICAL IMPRESSION: RLE comparative limb volumetrics reveal a 5.84% limb volume reduction since initially measured on 07/19/23. This value is a little over halfway towards goal of 10% volume reduction below the knee.   . Continued MLD to RLE/RLQ utilizing functional inguinal LN, deep abdominal pathways and modified short neck sequence to avoid thyroid region (No strokes to lateral neck and very gentle strokes to R and L of sternal notch over terminus. Pt tolerated manual therapy without increased pain. Provided skin care to limit infection risk using castor oil to increase skin excursion during MLD. Pt educated throughout session on lymphedema self care. Reapplied multilayer, knee length compression wraps, adding newly fabricated, custom , Comprex foam "kidneys" be hind   each malleolus.. Reviewed bandaging reviewing simultaneously with wrapping. Cont as per POC.  (INITIAL EVAL 07/12/23: Jisell "Judith Arnold" Richad Champagne is a 73 yo female presenting with chronic, progressive, BLE swelling 2/2 unknown etiology, which at first glance appears to be circulatory in  nature due to skin coloring at distal legs where swelling is most invested. Pt is presently unable to wear traditional elastic compression garments due to difficulty donning and doffing them.  Chronic, progressive lymphedema with associated skin changes, including fibrosis, limits this patent's functional performance in all occupational domains, including functional ambulation and mobility, basic and instrumental ADLs (lower body dressing, LB bathing, fitting street shoes and LB clothing, driving, shopping, and home management. It also limits perform productive activities, leisure pursuits, and participation in social and community activities. BLE lymphedema contributes to elevated infection risk. Pt will benefit from skilled OT for Complete Decongestive Therapy (CDT), which  typically includes manual lymphatic drainage (MLD), skin care to limit' infection risk and increase skin excursion, lymphatic pumping exercise, and during the Intensive Phase multilayer, gradient compression bandaging to reduce limb volume. Once limb volume is reduced as much as possible, Pt is fitted with appropriate compression garments and/ or devices and transitions into the self management phase of care consisting of follow long support PRN.    Pt understands that her fair prognosis will become poor without daily assistance with compression wrapping since she is unable to reach feet and legs to apply them herself. Pt assures OT that she has a friend she believes is willing to assist her between OT sessions. )   OBJECTIVE IMPAIRMENTS: decreased standing and walking tolerance, activity tolerance, decreased balance, decreased knowledge of condition, decreased knowledge of use of DME, decreased mobility, decreased ROM, decreased strength, increased edema, impaired flexibility, impaired sensation, impaired UE functional use, impaired vision/reception, pain, and chronic, progressive, BLE swelling and associated skin changes, at increased  infection risk   ACTIVITY LIMITATIONS: Functional ambulation and mobility (lifting, carrying, steps/stairs, transfers, squatting) , Basic and instrumental ADLs, leisure pursuits, productive activities, social participation  PARTICIPATION LIMITATIONS: meal prep, cleaning, laundry, driving, shopping, yard work, and    PERSONAL FACTORS: Age, Fitness, Past/current experiences, Time since onset of injury/illness/exacerbation, 2+ co morbidities: OSA, HTN,  are also affecting patient's functional outcome.   REHAB POTENTIAL: Good  EVALUATION COMPLEXITY: Moderate   GOALS: Goals reviewed with patient? Yes  SHORT TERM GOALS: Target date: 4th OT Rx visit   Pt  will demonstrate understanding of lymphedema precautions and prevention strategies with modified independence using a printed reference to identify at least 5 precautions and discussing how s/he may implement them into daily life to reduce risk of progression with extra time. Baseline:Max A Goal status: GOAL MET  2.  Pt will be able to apply multilayer, knee length, gradient, compression wraps to one leg at a time from toes to below knee with max caregiver assist to decrease limb volume, to limit infection risk, and to limit lymphedema progression.  Baseline: Dependent Goal status: GOAL MET  LONG TERM GOALS: Target date: 09/19/23  1.Given this patient's Intake score of TBA % on the Lymphedema Life Impact Scale (LLIS), patient will experience a reduction of at least 5 points in her perceived level of functional impairment resulting from lymphedema to improve functional performance and quality of life (QOL). Baseline: TBA % Goal status:  GOAL DEFERRED  2.  Pt will achieve at least a 10% volume reduction in B legs to return limb to typical size and shape, to limit infection risk and LE progression, to decrease pain, to improve function. Baseline: Dependent Goal status: PROGRESSING. R LEG volume reduced by 5.84% since initially measured on  07/18/21  3.  Pt will obtain appropriate compression garments/devices and achieve modified independence (extra time + assistive devices) with donning/doffing to optimize limb volume reductions and limit LE progression over time. Baseline: Dependent Goal status: PROGRESSING  During Intensive phase CDT, with max CG assistance, Pt will achieve at least 85% compliance with all adapted lymphedema self-care home program components, including daily skin care, compression wraps and /or garments, simple self MLD and lymphatic pumping therex to habituate LE self care protocol  into ADLs for optimal LE self-management over time. Baseline: Dependent Goal status:PROGRESSING  PLAN:  OT FREQUENCY: 2 x/week  OT DURATION: 12 weeks  PLANNED INTERVENTIONS:compression bandaging, skin care,  97110-Therapeutic exercises, 97530- Therapeutic activity, 97535- Self Care, Manual lymph drainage, DME instructions, and fit with appropriate compression  PLAN FOR NEXT SESSION:  RLE MLD and skin care RLE multilayer compression bandaging Cont Pt edu for LE self-care   Arnold Bicker, MS, OTR/L, CLT-LANA 08/15/23 1:13 PM

## 2023-08-17 ENCOUNTER — Ambulatory Visit: Admitting: Occupational Therapy

## 2023-08-18 ENCOUNTER — Ambulatory Visit: Admitting: Occupational Therapy

## 2023-08-18 ENCOUNTER — Encounter: Payer: Self-pay | Admitting: Occupational Therapy

## 2023-08-18 DIAGNOSIS — I890 Lymphedema, not elsewhere classified: Secondary | ICD-10-CM

## 2023-08-18 NOTE — Therapy (Signed)
 OUTPATIENT OCCUPATIONAL THERAPY TREATMENT NOTE AND PROGRESS REPORT  BILATERAL LOWER EXTREMITY LYMPHEDEMA  Patient Name: Judith Arnold MRN: [id number] DOB:[birth year], 73 y.o., female Today's Date: 08/18/2023  REPORTING PERIOD:  07/12/23 - 08/18/23  END OF SESSION:   OT End of Session - 08/18/23 1004     Visit Number 10    Number of Visits 36    Date for OT Re-Evaluation 10/10/23    OT Start Time 1004    Activity Tolerance Patient tolerated treatment well;No increased pain;Other (comment)              Past Medical History:  Diagnosis Date   Allergy    Arthritis    lower back, left shoulder   Asthma    Carpal tunnel syndrome on both sides    Dental bridge present    top   Dental crown present    multiple   Family history of adverse reaction to anesthesia    Father and sister - PONV   GERD (gastroesophageal reflux disease)    Glaucoma    Hypertension    Leaky heart valve    Pre-diabetes    Sleep apnea    Past Surgical History:  Procedure Laterality Date   CATARACT EXTRACTION W/PHACO Right 11/16/2016   Procedure: CATARACT EXTRACTION PHACO AND INTRAOCULAR LENS PLACEMENT (IOC)  right;  Surgeon: Judith Kidney, MD;  Location: [facility];  Service: Ophthalmology;  Laterality: Right;  pre diabetic latex sensitivity sleep apnea   CATARACT EXTRACTION W/PHACO Left 01/18/2017   Procedure: CATARACT EXTRACTION PHACO AND INTRAOCULAR LENS PLACEMENT (IOC) LEFT DIABETIC;  Surgeon: Judith Kidney, MD;  Location: [facility];  Service: Ophthalmology;  Laterality: Left;   COLONOSCOPY     COLONOSCOPY WITH PROPOFOL  N/A 04/09/2018   Procedure: COLONOSCOPY WITH PROPOFOL ;  Surgeon: Judith Click, MD;  Location: [facility];  Service: Endoscopy;  Laterality: N/A;   IRIDOTOMY / IRIDECTOMY Bilateral 2005   TOOTH EXTRACTION     Patient Active Problem List   Diagnosis Date Noted   Hypertension associated with diabetes (HCC) 01/20/2023   Combined  hyperlipidemia associated with type 2 diabetes mellitus (HCC) 01/20/2023   Intertrigo 11/01/2022   Stasis dermatitis of both legs 10/21/2022   Leg swelling 10/21/2022   Lymphedema of lower extremity 10/21/2022   Type 2 diabetes mellitus without complication, without long-term current use of insulin (HCC) 05/24/2022   Mixed hyperlipidemia 05/24/2022   Essential hypertension, benign 05/24/2022   Absolute anemia 05/24/2022   Gastroesophageal reflux disease without esophagitis 05/24/2022   Allergic rhinitis 05/24/2022   Asthma, allergic, moderate persistent, uncomplicated 05/24/2022   Bilateral chronic angle-closure glaucoma, indeterminate stage 05/24/2022   Airway hyperreactivity 08/29/2013   OSA on CPAP 08/29/2013    PCP: Judith Hove, MD  REFERRING PROVIDER: same  REFERRING DIAG: lymphedema I89.0  THERAPY DIAG:  No diagnosis found.  Rationale for Evaluation and Treatment: Rehabilitation  ONSET DATE: Pt reports onset of L ankle / leg swelling after injury 20-30 yrs ago. Then about 1 year ago legs started swelling and Pt having prickly sensation on shins and thighs bilaterally.   SUBJECTIVE:  SUBJECTIVE STATEMENT: "Judith Arnold" Judith Arnold presents to OT for treatment of BLE lymphedema 2/2 suspected CVI and obesity. Pt is unaccompanied today. Pt reports pain in proximal thighs 5/10. Pt reports her helper did not come to assist w wrap on Sunday, but she did come yesterday to assist. Pt has no new concerns.   (INITIAL EVAL 07/12/23 : Judith Arnold is referred to Occupational Therapy by Judith Hove, MD,  for evaluation and treatment of BLE lymphedema.  Pt reports onset of L ankle and distal leg swelling about 30 years ago, then noticed RLE swelling and worsening LLE swelling about 1 year ago at same  time she started having unusual sensation in legs. Pt denies previously having lymphedema treatment. Pt reports paternal aunt had leg swelling. Pt is unable to wear compression stockings because she is unable to reach feet and distal legs to don and doff them due to back and hip pain.)  PERTINENT HISTORY: Relevant to lymphedema (OSA (denies using CPAP), BLE stasis dermatitis, HTN, DM 2 (Pt states she is prediabetic only), B glaucoma, Back and R hip OA  PAIN:  Are you having pain? Yes: NPRS scale: 5/10 Pain location: B legs Pain description: like "stickers" Aggravating factors: standing, walking, dependent sitting Relieving factors: elevation  PRECAUTIONS: Fall and Other: LYMPHEDEMA precautions  (DM 2 and asthma)  WEIGHT BEARING RESTRICTIONS: No  FALLS:  Has patient fallen in last 6 months? TBA  LIVING ENVIRONMENT: Lives with: TBA Lives in: House/apartment Stairs: No;  Has following equipment at home: Environmental consultant - 4 wheeled  OCCUPATION: retired Scientist, research (life sciences)  LEISURE: reading and taking notes on current events and politics  HAND DOMINANCE: right   PRIOR LEVEL OF FUNCTION: Independent with household mobility with device, Requires assistive device for independence, Needs assistance with ADLs, Needs assistance with homemaking, and Needs assistance with gait  PATIENT GOALS: reduce swelling and keep it from getting worse  OBJECTIVE: Note: Objective measures were completed at Evaluation unless otherwise noted.  COGNITION:  Overall cognitive status: impaired short term  memory   OBSERVATIONS / OTHER ASSESSMENTS: Mild, Stage II, BLE Lymphedema 2/2 suspected venous insufficiency.   SENSATION: Reports uncomfortable scratching, or sticker-like  sensation on anterior thighs   POSTURE: Raised walker handles 2 notches to increase upright spinal alignment when walking to limit falls risk. Handles need to be raised at least 1 hole more for safe upright posture when walking.  Upright  sitting posture: head forward , shoulders forward and rounded- appears to be flexible kyphosis  LE ROM: WFL for ankles and knees. Pt reports limited hip abduction )  LE MMT: WFL FOR LYMPHEDEMA CARE. Presenting with generalized weakness and Pt reports debility  LYMPHEDEMA ASSESSMENTS:   SURGERY TYPE/DATE: Non-cancer related limb swelling  HX INFECTIONS: positive for 1 episode cellulitis treated w antibiotics  Hx WOUNDS: denies   BLE COMPARATIVE LIMB VOLUMETRICS: INITIAL 07/19/23  LANDMARK RIGHT (dominant)  R LEG (A-D) 3525.1 ml  R THIGH (E-G) ml  R FULL LIMB (A-G) ml  Limb Volume differential (LVD)  %  Volume change since initial %  Volume change overall V  (Blank rows = not tested)  LANDMARK LEFT    L LEG (A-D) 3769.6 ml  L THIGH (E-G) ml  L FULL LIMB (A-G) ml  Limb Volume differential (LVD)  Limb Volume Differential (LVD) measures 6.5%, L>R.  Volume change since initial %  Volume change overall %    RLE COMPARATIVE LIMB VOLUMETRICS: VISIT 9 08/15/23  LANDMARK RIGHT (dominant)  R  LEG (A-D) 3549.4 ml  R THIGH (E-G) ml  R FULL LIMB (A-G) ml  Limb Volume differential (LVD)  %  Volume change since initial R LEG volume reduced by 5.84% since initially measured on 07/18/21  Volume change overall V  (Blank rows = not tested)  Mild, Stage  II, Bilateral Lower Extremity Lymphedema 2/2 CVI and Obesity  Skin  Description Hyper-Keratosis Peau' de Orange Shiny Tight Fibrotic/ Indurated Fatty Doughy Spongy/ boggy       R>L x  x   Skin dry Flaky WNL Macerated   mildly      Color Redness Varicosities Blanching Hemosiderin Stain Mottled   x     x   Odor Malodorous Yeast Fungal infection  WNL      x   Temperature Warm Cool wnl    x     Pitting Edema   1+ 2+ 3+ 4+ Non-pitting         x   Girth Symmetrical Asymmetrical                   Distribution    R>L toes to groin    Stemmer Sign Positive Negative   +    Lymphorrhea History Of:  Present Absent     x     Wounds History Of Present Absent Venous Arterial Pressure Sheer     x        Signs of Infection Redness Warmth Erythema Acute Swelling Drainage Borders                    Sensation Light Touch Deep pressure Hypersensitivity   Present Impaired Present Impaired Absent Impaired   x Tactile  x  x     Nails WNL   Fungus nail dystrophy   x     Hair Growth Symmetrical Asymmetrical   x    Skin Creases Base of toes  Ankles   Base of Fingers knees       Abdominal pannus Thigh Lobules  Face/neck   x x  x      (Blank rows = not tested)    GAIT: Distance walked: >500' Assistive device utilized: Environmental consultant - 4 wheeled Level of assistance: Modified independence Comments: Pt bent at waist nearly 90 degrees with walker handles in lowest position  LYMPHEDEMA LIFE IMPACT SCALE (LLIS): TBA OT Rx visit 1  TREATMENT DATE:  RLE/RLQ MLD with simultaneous skin care as established RLE multilayer compression bandaging Pt edu for lymphedema self-care throughout session    PATIENT EDUCATION:  Continued Pt/ CG edu for lymphedema self care home program throughout session. Topics include outcome of comparative limb volumetrics- starting limb volume differentials (LVDs), technology and gradient techniques used for short stretch, multilayer compression wrapping, simple self-MLD, therapeutic lymphatic pumping exercises, skin/nail care, LE precautions, compression garment recommendations and specifications, wear and care schedule and compression garment donning / doffing w assistive devices. Discussed progress towards all OT goals since commencing CDT. Discussed detrimental impact of obesity on lower and upper extremity lymphedema over time. Reviewed OT goals for lymphedema care with Pt and discussed progress to date.  All questions answered to the Pt's satisfaction. Good return. Person educated: Patient  Education method: Explanation, Demonstration, and Handouts Education comprehension: verbalized  understanding, returned demonstration, verbal cues required, and needs further education   HOME EXERCISE PROGRAM: BLE lymphatic pumping there ex using- 1 sets of 10 reps, each exercise in order-  1-2 x daily, bilaterally Simple self MLD 1 x  daily Daily skin care to increase hydration, skin mobility and decrease infection risk- can be done during MLD During Intensive Phase CDT: Compression wraps 23/7 until limb volume reduction complete During Self-management Phase CDT: Fit with appropriate compression garments or alternatives. Consider BLE, knee length, Mediven, custom, CircAid , Velcro style leggings over soft cotton liners.  ASSESSMENT: CLINICAL IMPRESSION: RLE comparative limb volumetrics reveal a 5.84% limb volume reduction since initially measured on 07/19/23. This value is a little over halfway towards goal of 10% volume reduction below the knee. Pt demonstrates ongoing progress towards all goals. Pls review GOALS section for details.  . Continued MLD to RLE/RLQ utilizing functional inguinal LN, deep abdominal pathways and modified short neck sequence to avoid thyroid region (No strokes to lateral neck and very gentle strokes to R and L of sternal notch over terminus. Pt tolerated manual therapy without increased pain. Provided skin care to limit infection risk using castor oil to increase skin excursion during MLD. Pt educated throughout session on lymphedema self care. Reapplied multilayer, knee length compression wraps, adding newly fabricated, custom , Comprex foam "kidneys" be hind   each malleolus.. Reviewed bandaging reviewing simultaneously with wrapping. Cont as per POC.  (INITIAL EVAL 07/12/23: Judith Arnold is a 73 yo female presenting with chronic, progressive, BLE swelling 2/2 unknown etiology, which at first glance appears to be circulatory in nature due to skin coloring at distal legs where swelling is most invested. Pt is presently unable to wear traditional elastic  compression garments due to difficulty donning and doffing them.  Chronic, progressive lymphedema with associated skin changes, including fibrosis, limits this patent's functional performance in all occupational domains, including functional ambulation and mobility, basic and instrumental ADLs (lower body dressing, LB bathing, fitting street shoes and LB clothing, driving, shopping, and home management. It also limits perform productive activities, leisure pursuits, and participation in social and community activities. BLE lymphedema contributes to elevated infection risk. Pt will benefit from skilled OT for Complete Decongestive Therapy (CDT), which  typically includes manual lymphatic drainage (MLD), skin care to limit' infection risk and increase skin excursion, lymphatic pumping exercise, and during the Intensive Phase multilayer, gradient compression bandaging to reduce limb volume. Once limb volume is reduced as much as possible, Pt is fitted with appropriate compression garments and/ or devices and transitions into the self management phase of care consisting of follow long support PRN.    Pt understands that her fair prognosis will become poor without daily assistance with compression wrapping since she is unable to reach feet and legs to apply them herself. Pt assures OT that she has a friend she believes is willing to assist her between OT sessions. )   OBJECTIVE IMPAIRMENTS: decreased standing and walking tolerance, activity tolerance, decreased balance, decreased knowledge of condition, decreased knowledge of use of DME, decreased mobility, decreased ROM, decreased strength, increased edema, impaired flexibility, impaired sensation, impaired UE functional use, impaired vision/reception, pain, and chronic, progressive, BLE swelling and associated skin changes, at increased infection risk   ACTIVITY LIMITATIONS: Functional ambulation and mobility (lifting, carrying, steps/stairs, transfers,  squatting) , Basic and instrumental ADLs, leisure pursuits, productive activities, social participation  PARTICIPATION LIMITATIONS: meal prep, cleaning, laundry, driving, shopping, yard work, and    PERSONAL FACTORS: Age, Fitness, Past/current experiences, Time since onset of injury/illness/exacerbation, 2+ co morbidities: OSA, HTN,  are also affecting patient's functional outcome.   REHAB POTENTIAL: Good  EVALUATION COMPLEXITY: Moderate   GOALS: Goals reviewed with patient? Yes  SHORT TERM GOALS:  Target date: 4th OT Rx visit   Pt will demonstrate understanding of lymphedema precautions and prevention strategies with modified independence using a printed reference to identify at least 5 precautions and discussing how s/he may implement them into daily life to reduce risk of progression with extra time. Baseline:Max A Goal status: GOAL MET  2.  Pt will be able to apply multilayer, knee length, gradient, compression wraps to one leg at a time from toes to below knee with max caregiver assist to decrease limb volume, to limit infection risk, and to limit lymphedema progression.  Baseline: Dependent Goal status: GOAL MET  LONG TERM GOALS: Target date: 09/19/23  1.Given this patient's Intake score of TBA % on the Lymphedema Life Impact Scale (LLIS), patient will experience a reduction of at least 5 points in her perceived level of functional impairment resulting from lymphedema to improve functional performance and quality of life (QOL). Baseline: TBA % Goal status:  GOAL DEFERRED  2.  Pt will achieve at least a 10% volume reduction in B legs to return limb to typical size and shape, to limit infection risk and LE progression, to decrease pain, to improve function. Baseline: Dependent Goal status: PROGRESSING. R LEG volume reduced by 5.84% since initially measured on 07/18/21  3.  Pt will obtain appropriate compression garments/devices and achieve modified independence (extra time +  assistive devices) with donning/doffing to optimize limb volume reductions and limit LE progression over time. Baseline: Dependent Goal status: PROGRESSING  During Intensive phase CDT, with max CG assistance, Pt will achieve at least 85% compliance with all adapted lymphedema self-care home program components, including daily skin care, compression wraps and /or garments, simple self MLD and lymphatic pumping therex to habituate LE self care protocol  into ADLs for optimal LE self-management over time. Baseline: Dependent Goal status:PROGRESSING  PLAN:  OT FREQUENCY: 2 x/week  OT DURATION: 12 weeks  PLANNED INTERVENTIONS:compression bandaging, skin care,  97110-Therapeutic exercises, 97530- Therapeutic activity, 97535- Self Care, Manual lymph drainage, DME instructions, and fit with appropriate compression  PLAN FOR NEXT SESSION:  RLE MLD and skin care RLE multilayer compression bandaging Cont Pt edu for LE self-care   Arnold Bicker, MS, OTR/L, CLT-LANA 08/18/23 10:11 AM

## 2023-08-22 ENCOUNTER — Ambulatory Visit: Admitting: Occupational Therapy

## 2023-08-22 ENCOUNTER — Encounter: Payer: Self-pay | Admitting: Occupational Therapy

## 2023-08-22 DIAGNOSIS — I890 Lymphedema, not elsewhere classified: Secondary | ICD-10-CM | POA: Diagnosis not present

## 2023-08-22 NOTE — Therapy (Signed)
 OUTPATIENT OCCUPATIONAL THERAPY TREATMENT NOTE  BILATERAL LOWER EXTREMITY LYMPHEDEMA  Patient Name: Judith Arnold MRN: [id number] DOB:Jul 16, 1950, 73 y.o., female Today's Date: 08/22/2023  REPORTING PERIOD:    END OF SESSION:   OT End of Session - 08/22/23 1419     Visit Number 11    Number of Visits 36    Date for OT Re-Evaluation 10/10/23    OT Start Time 0210    OT Stop Time 0310    OT Time Calculation (min) 60 min    Activity Tolerance Patient tolerated treatment well;No increased pain;Other (comment)              Past Medical History:  Diagnosis Date   Allergy    Arthritis    lower back, left shoulder   Asthma    Carpal tunnel syndrome on both sides    Dental bridge present    top   Dental crown present    multiple   Family history of adverse reaction to anesthesia    Father and sister - PONV   GERD (gastroesophageal reflux disease)    Glaucoma    Hypertension    Leaky heart valve    Pre-diabetes    Sleep apnea    Past Surgical History:  Procedure Laterality Date   CATARACT EXTRACTION W/PHACO Right 11/16/2016   Procedure: CATARACT EXTRACTION PHACO AND INTRAOCULAR LENS PLACEMENT (IOC)  right;  Surgeon: Judith Kidney, MD;  Location: [facility];  Service: Ophthalmology;  Laterality: Right;  pre diabetic latex sensitivity sleep apnea   CATARACT EXTRACTION W/PHACO Left 01/18/2017   Procedure: CATARACT EXTRACTION PHACO AND INTRAOCULAR LENS PLACEMENT (IOC) LEFT DIABETIC;  Surgeon: Judith Kidney, MD;  Location: [facility];  Service: Ophthalmology;  Laterality: Left;   COLONOSCOPY     COLONOSCOPY WITH PROPOFOL  N/A 04/09/2018   Procedure: COLONOSCOPY WITH PROPOFOL ;  Surgeon: Judith Click, MD;  Location: [facility];  Service: Endoscopy;  Laterality: N/A;   IRIDOTOMY / IRIDECTOMY Bilateral 2005   TOOTH EXTRACTION     Patient Active Problem List   Diagnosis Date Noted   Hypertension associated with diabetes (HCC)  01/20/2023   Combined hyperlipidemia associated with type 2 diabetes mellitus (HCC) 01/20/2023   Intertrigo 11/01/2022   Stasis dermatitis of both legs 10/21/2022   Leg swelling 10/21/2022   Lymphedema of lower extremity 10/21/2022   Type 2 diabetes mellitus without complication, without long-term current use of insulin (HCC) 05/24/2022   Mixed hyperlipidemia 05/24/2022   Essential hypertension, benign 05/24/2022   Absolute anemia 05/24/2022   Gastroesophageal reflux disease without esophagitis 05/24/2022   Allergic rhinitis 05/24/2022   Asthma, allergic, moderate persistent, uncomplicated 05/24/2022   Bilateral chronic angle-closure glaucoma, indeterminate stage 05/24/2022   Airway hyperreactivity 08/29/2013   OSA on CPAP 08/29/2013    PCP: Judith Hove, MD  REFERRING PROVIDER: same  REFERRING DIAG: lymphedema I89.0  THERAPY DIAG:  Lymphedema, not elsewhere classified  Rationale for Evaluation and Treatment: Rehabilitation  ONSET DATE: Pt reports onset of L ankle / leg swelling after injury 20-30 yrs ago. Then about 1 year ago legs started swelling and Pt having prickly sensation on shins and thighs bilaterally.   SUBJECTIVE:  SUBJECTIVE STATEMENT: "Judith Arnold" Judith Arnold presents to OT for treatment of BLE lymphedema 2/2 suspected CVI and obesity. Pt is unaccompanied today. Pt reports pain in proximal thighs 5/10. Pt reports her helper missed one day assisting with wraps, but other than that she comes every day. Pt has no new concerns.  (INITIAL EVAL 07/12/23 : Judith Arnold is referred to Occupational Therapy by Judith Hove, MD,  for evaluation and treatment of BLE lymphedema.  Pt reports onset of L ankle and distal leg swelling about 30 years ago, then noticed RLE swelling and worsening LLE  swelling about 1 year ago at same time she started having unusual sensation in legs. Pt denies previously having lymphedema treatment. Pt reports paternal aunt had leg swelling. Pt is unable to wear compression stockings because she is unable to reach feet and distal legs to don and doff them due to back and hip pain.)  PERTINENT HISTORY: Relevant to lymphedema (OSA (denies using CPAP), BLE stasis dermatitis, HTN, DM 2 (Pt states she is prediabetic only), B glaucoma, Back and R hip OA  PAIN:  Are you having pain? Yes: NPRS scale: 5/10 Pain location: B legs Pain description: like "stickers" Aggravating factors: standing, walking, dependent sitting Relieving factors: elevation  PRECAUTIONS: Fall and Other: LYMPHEDEMA precautions  (DM 2 and asthma)  WEIGHT BEARING RESTRICTIONS: No  FALLS:  Has patient fallen in last 6 months? TBA  LIVING ENVIRONMENT: Lives with: TBA Lives in: House/apartment Stairs: No;  Has following equipment at home: Environmental consultant - 4 wheeled  OCCUPATION: retired Scientist, research (life sciences)  LEISURE: reading and taking notes on current events and politics  HAND DOMINANCE: right   PRIOR LEVEL OF FUNCTION: Independent with household mobility with device, Requires assistive device for independence, Needs assistance with ADLs, Needs assistance with homemaking, and Needs assistance with gait  PATIENT GOALS: reduce swelling and keep it from getting worse  OBJECTIVE: Note: Objective measures were completed at Evaluation unless otherwise noted.  COGNITION:  Overall cognitive status: impaired short term  memory   OBSERVATIONS / OTHER ASSESSMENTS: Mild, Stage II, BLE Lymphedema 2/2 suspected venous insufficiency.   SENSATION: Reports uncomfortable scratching, or sticker-like  sensation on anterior thighs   POSTURE: Raised walker handles 2 notches to increase upright spinal alignment when walking to limit falls risk. Handles need to be raised at least 1 hole more for safe  upright posture when walking.  Upright sitting posture: head forward , shoulders forward and rounded- appears to be flexible kyphosis  LE ROM: WFL for ankles and knees. Pt reports limited hip abduction )  LE MMT: WFL FOR LYMPHEDEMA CARE. Presenting with generalized weakness and Pt reports debility  LYMPHEDEMA ASSESSMENTS:   SURGERY TYPE/DATE: Non-cancer related limb swelling  HX INFECTIONS: positive for 1 episode cellulitis treated w antibiotics  Hx WOUNDS: denies   BLE COMPARATIVE LIMB VOLUMETRICS: INITIAL 07/19/23  LANDMARK RIGHT (dominant)  R LEG (A-D) 3525.1 ml  R THIGH (E-G) ml  R FULL LIMB (A-G) ml  Limb Volume differential (LVD)  %  Volume change since initial %  Volume change overall V  (Blank rows = not tested)  LANDMARK LEFT    L LEG (A-D) 3769.6 ml  L THIGH (E-G) ml  L FULL LIMB (A-G) ml  Limb Volume differential (LVD)  Limb Volume Differential (LVD) measures 6.5%, L>R.  Volume change since initial %  Volume change overall %    RLE COMPARATIVE LIMB VOLUMETRICS: VISIT 9 08/15/23  LANDMARK RIGHT (dominant)  R LEG (A-D) 3549.4  ml  R THIGH (E-G) ml  R FULL LIMB (A-G) ml  Limb Volume differential (LVD)  %  Volume change since initial R LEG volume reduced by 5.84% since initially measured on 07/18/21  Volume change overall V  (Blank rows = not tested)  Mild, Stage  II, Bilateral Lower Extremity Lymphedema 2/2 CVI and Obesity  Skin  Description Hyper-Keratosis Peau' de Orange Shiny Tight Fibrotic/ Indurated Fatty Doughy Spongy/ boggy       R>L x  x   Skin dry Flaky WNL Macerated   mildly      Color Redness Varicosities Blanching Hemosiderin Stain Mottled   x     x   Odor Malodorous Yeast Fungal infection  WNL      x   Temperature Warm Cool wnl    x     Pitting Edema   1+ 2+ 3+ 4+ Non-pitting         x   Girth Symmetrical Asymmetrical                   Distribution    R>L toes to groin    Stemmer Sign Positive Negative   +     Lymphorrhea History Of:  Present Absent     x    Wounds History Of Present Absent Venous Arterial Pressure Sheer     x        Signs of Infection Redness Warmth Erythema Acute Swelling Drainage Borders                    Sensation Light Touch Deep pressure Hypersensitivity   Present Impaired Present Impaired Absent Impaired   x Tactile  x  x     Nails WNL   Fungus nail dystrophy   x     Hair Growth Symmetrical Asymmetrical   x    Skin Creases Base of toes  Ankles   Base of Fingers knees       Abdominal pannus Thigh Lobules  Face/neck   x x  x      (Blank rows = not tested)    GAIT: Distance walked: >500' Assistive device utilized: Environmental consultant - 4 wheeled Level of assistance: Modified independence Comments: Pt bent at waist nearly 90 degrees with walker handles in lowest position  LYMPHEDEMA LIFE IMPACT SCALE (LLIS): TBA OT Rx visit 1  TREATMENT DATE:  RLE/RLQ MLD with simultaneous skin care as established RLE multilayer compression bandaging Pt edu for lymphedema self-care throughout session    PATIENT EDUCATION:  Continued Pt/ CG edu for lymphedema self care home program throughout session. Topics include outcome of comparative limb volumetrics- starting limb volume differentials (LVDs), technology and gradient techniques used for short stretch, multilayer compression wrapping, simple self-MLD, therapeutic lymphatic pumping exercises, skin/nail care, LE precautions, compression garment recommendations and specifications, wear and care schedule and compression garment donning / doffing w assistive devices. Discussed progress towards all OT goals since commencing CDT. Discussed detrimental impact of obesity on lower and upper extremity lymphedema over time. Reviewed OT goals for lymphedema care with Pt and discussed progress to date.  All questions answered to the Pt's satisfaction. Good return. Person educated: Patient  Education method: Explanation, Demonstration,  and Handouts Education comprehension: verbalized understanding, returned demonstration, verbal cues required, and needs further education   HOME EXERCISE PROGRAM: BLE lymphatic pumping there ex using- 1 sets of 10 reps, each exercise in order-  1-2 x daily, bilaterally Simple self MLD 1 x daily Daily skin  care to increase hydration, skin mobility and decrease infection risk- can be done during MLD During Intensive Phase CDT: Compression wraps 23/7 until limb volume reduction complete During Self-management Phase CDT: Fit with appropriate compression garments or alternatives. Consider BLE, knee length, Mediven, custom, CircAid , Velcro style leggings over soft cotton liners.  ASSESSMENT: CLINICAL IMPRESSION: Pt continues to demonstrate ongoing progress towards all goals. Continued MLD to RLE/RLQ utilizing functional inguinal LN, deep abdominal pathways and modified short neck sequence to avoid thyroid region (No strokes to lateral neck and very gentle strokes to R and L of sternal notch over terminus. Pt tolerated manual therapy without increased pain. Provided skin care to limit infection risk using castor oil to increase skin excursion during MLD. Pt educated throughout session on lymphedema self care. Reapplied multilayer, knee length compression wraps, adding newly fabricated, custom , Comprex foam "kidneys" be hind   each malleolus.. Reviewed bandaging reviewing simultaneously with wrapping. Cont as per POC.  (INITIAL EVAL 07/12/23: Regnia "Judith Arnold" Richad Champagne is a 73 yo female presenting with chronic, progressive, BLE swelling 2/2 unknown etiology, which at first glance appears to be circulatory in nature due to skin coloring at distal legs where swelling is most invested. Pt is presently unable to wear traditional elastic compression garments due to difficulty donning and doffing them.  Chronic, progressive lymphedema with associated skin changes, including fibrosis, limits this patent's functional  performance in all occupational domains, including functional ambulation and mobility, basic and instrumental ADLs (lower body dressing, LB bathing, fitting street shoes and LB clothing, driving, shopping, and home management. It also limits perform productive activities, leisure pursuits, and participation in social and community activities. BLE lymphedema contributes to elevated infection risk. Pt will benefit from skilled OT for Complete Decongestive Therapy (CDT), which  typically includes manual lymphatic drainage (MLD), skin care to limit' infection risk and increase skin excursion, lymphatic pumping exercise, and during the Intensive Phase multilayer, gradient compression bandaging to reduce limb volume. Once limb volume is reduced as much as possible, Pt is fitted with appropriate compression garments and/ or devices and transitions into the self management phase of care consisting of follow long support PRN.    Pt understands that her fair prognosis will become poor without daily assistance with compression wrapping since she is unable to reach feet and legs to apply them herself. Pt assures OT that she has a friend she believes is willing to assist her between OT sessions. )   OBJECTIVE IMPAIRMENTS: decreased standing and walking tolerance, activity tolerance, decreased balance, decreased knowledge of condition, decreased knowledge of use of DME, decreased mobility, decreased ROM, decreased strength, increased edema, impaired flexibility, impaired sensation, impaired UE functional use, impaired vision/reception, pain, and chronic, progressive, BLE swelling and associated skin changes, at increased infection risk   ACTIVITY LIMITATIONS: Functional ambulation and mobility (lifting, carrying, steps/stairs, transfers, squatting) , Basic and instrumental ADLs, leisure pursuits, productive activities, social participation  PARTICIPATION LIMITATIONS: meal prep, cleaning, laundry, driving, shopping, yard  work, and    PERSONAL FACTORS: Age, Fitness, Past/current experiences, Time since onset of injury/illness/exacerbation, 2+ co morbidities: OSA, HTN,  are also affecting patient's functional outcome.   REHAB POTENTIAL: Good  EVALUATION COMPLEXITY: Moderate   GOALS: Goals reviewed with patient? Yes  SHORT TERM GOALS: Target date: 4th OT Rx visit   Pt will demonstrate understanding of lymphedema precautions and prevention strategies with modified independence using a printed reference to identify at least 5 precautions and discussing how s/he may implement them into daily  life to reduce risk of progression with extra time. Baseline:Max A Goal status: GOAL MET  2.  Pt will be able to apply multilayer, knee length, gradient, compression wraps to one leg at a time from toes to below knee with max caregiver assist to decrease limb volume, to limit infection risk, and to limit lymphedema progression.  Baseline: Dependent Goal status: GOAL MET  LONG TERM GOALS: Target date: 09/19/23  1.Given this patient's Intake score of TBA % on the Lymphedema Life Impact Scale (LLIS), patient will experience a reduction of at least 5 points in her perceived level of functional impairment resulting from lymphedema to improve functional performance and quality of life (QOL). Baseline: TBA % Goal status:  GOAL DEFERRED  2.  Pt will achieve at least a 10% volume reduction in B legs to return limb to typical size and shape, to limit infection risk and LE progression, to decrease pain, to improve function. Baseline: Dependent Goal status: PROGRESSING. R LEG volume reduced by 5.84% since initially measured on 07/18/21  3.  Pt will obtain appropriate compression garments/devices and achieve modified independence (extra time + assistive devices) with donning/doffing to optimize limb volume reductions and limit LE progression over time. Baseline: Dependent Goal status: PROGRESSING  During Intensive phase CDT, with  max CG assistance, Pt will achieve at least 85% compliance with all adapted lymphedema self-care home program components, including daily skin care, compression wraps and /or garments, simple self MLD and lymphatic pumping therex to habituate LE self care protocol  into ADLs for optimal LE self-management over time. Baseline: Dependent Goal status:PROGRESSING  PLAN:  OT FREQUENCY: 2 x/week  OT DURATION: 12 weeks  PLANNED INTERVENTIONS:compression bandaging, skin care,  97110-Therapeutic exercises, 97530- Therapeutic activity, 97535- Self Care, Manual lymph drainage, DME instructions, and fit with appropriate compression  PLAN FOR NEXT SESSION:  RLE MLD and skin care RLE multilayer compression bandaging Cont Pt edu for LE self-care   Arnold Bicker, MS, OTR/L, CLT-LANA 08/22/23 3:59 PM

## 2023-08-24 ENCOUNTER — Ambulatory Visit (INDEPENDENT_AMBULATORY_CARE_PROVIDER_SITE_OTHER): Admitting: Internal Medicine

## 2023-08-24 ENCOUNTER — Encounter: Payer: Self-pay | Admitting: Internal Medicine

## 2023-08-24 VITALS — BP 131/69 | HR 77 | Ht 61.0 in | Wt 183.0 lb

## 2023-08-24 DIAGNOSIS — I152 Hypertension secondary to endocrine disorders: Secondary | ICD-10-CM

## 2023-08-24 DIAGNOSIS — E1159 Type 2 diabetes mellitus with other circulatory complications: Secondary | ICD-10-CM

## 2023-08-24 DIAGNOSIS — E782 Mixed hyperlipidemia: Secondary | ICD-10-CM | POA: Diagnosis not present

## 2023-08-24 DIAGNOSIS — M5441 Lumbago with sciatica, right side: Secondary | ICD-10-CM | POA: Diagnosis not present

## 2023-08-24 DIAGNOSIS — E1169 Type 2 diabetes mellitus with other specified complication: Secondary | ICD-10-CM

## 2023-08-24 DIAGNOSIS — E119 Type 2 diabetes mellitus without complications: Secondary | ICD-10-CM

## 2023-08-24 DIAGNOSIS — G8929 Other chronic pain: Secondary | ICD-10-CM

## 2023-08-24 DIAGNOSIS — I10 Essential (primary) hypertension: Secondary | ICD-10-CM

## 2023-08-24 NOTE — Progress Notes (Signed)
 Established Patient Office Visit  Subjective:  Patient ID: Judith Arnold, female    DOB: 04-30-1950  Age: 73 y.o. MRN: [id number]  Chief Complaint  Patient presents with   Follow-up    BP follow up    Patient comes in for her follow-up today.  Currently she is undergoing physical therapy for bilateral lymphedema and stasis dermatitis.  Her swelling in the lower legs has improved remarkably but there is very minimal redness still seen.  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  She has difficulty standing up straight without the help of her walker.  Will set up an physical therapy referral for back pain and strengthening exercises.  Patient mentions she gets dizziness when she stands up or changes her position.  She is not taking her Valsartan  currently but does have Lasix  20 mg/day which she does not take regularly.  Advised to take it every other day, may increase the frequency if starts retaining fluid again.  Patient has been advised to wear compression stockings which will help with her postural hypotension.    No other concerns at this time.   Past Medical History:  Diagnosis Date   Allergy    Arthritis    lower back, left shoulder   Asthma    Carpal tunnel syndrome on both sides    Dental bridge present    top   Dental crown present    multiple   Family history of adverse reaction to anesthesia    Father and sister - PONV   GERD (gastroesophageal reflux disease)    Glaucoma    Hypertension    Leaky heart valve    Pre-diabetes    Sleep apnea     Past Surgical History:  Procedure Laterality Date   CATARACT EXTRACTION W/PHACO Right 11/16/2016   Procedure: CATARACT EXTRACTION PHACO AND INTRAOCULAR LENS PLACEMENT (IOC)  right;  Surgeon: Annell Kidney, MD;  Location: [facility];  Service: Ophthalmology;  Laterality: Right;  pre diabetic latex sensitivity sleep apnea   CATARACT EXTRACTION W/PHACO Left 01/18/2017   Procedure:  CATARACT EXTRACTION PHACO AND INTRAOCULAR LENS PLACEMENT (IOC) LEFT DIABETIC;  Surgeon: Annell Kidney, MD;  Location: [facility];  Service: Ophthalmology;  Laterality: Left;   COLONOSCOPY     COLONOSCOPY WITH PROPOFOL  N/A 04/09/2018   Procedure: COLONOSCOPY WITH PROPOFOL ;  Surgeon: Cassie Click, MD;  Location: [facility];  Service: Endoscopy;  Laterality: N/A;   IRIDOTOMY / IRIDECTOMY Bilateral 2005   TOOTH EXTRACTION      Social History   Socioeconomic History   Marital status: Divorced    Spouse name: Not on file   Number of children: Not on file   Years of education: Not on file   Highest education level: Not on file  Occupational History   Not on file  Tobacco Use   Smoking status: Former    Current packs/day: 0.00    Types: Cigarettes    Quit date: 38    Years since quitting: 37.4   Smokeless tobacco: Never  Vaping Use   Vaping status: Never Used  Substance and Sexual Activity   Alcohol use: Yes    Alcohol/week: 0.0 standard drinks of alcohol    Comment: 5x/yr   Drug use: No   Sexual activity: Not on file  Other Topics Concern   Not on file  Social History Narrative   Not on file   Social Drivers of Health   Financial Resource Strain: Not on file  Food Insecurity: Not on file  Transportation Needs: Not on file  Physical Activity: Not on file  Stress: Not on file  Social Connections: Not on file  Intimate Partner Violence: Not on file    Family History  Problem Relation Age of Onset   Hypertension Mother    Diabetes Father    Breast cancer Neg Hx     Allergies  Allergen Reactions   Latex Other (See Comments)    Gets mouth rash from latex dental gloves. (Latex IgE - 08/25/16 - Negative)   Pseudoephedrine-Guaifenesin Other (See Comments)    Severe Headache   Other Rash    Walnuts cause mouth rash    Outpatient Medications Prior to Visit  Medication Sig   albuterol (VENTOLIN HFA) 108 (90 Base) MCG/ACT inhaler TAKE 2 PUFFS  BY MOUTH 4 TIMES A DAY AS NEEDED   aspirin EC 81 MG tablet Take by mouth.   b complex vitamins capsule Take 1 capsule by mouth daily.   bimatoprost (LUMIGAN) 0.01 % SOLN Apply to eye.   Cetirizine HCl 10 MG CAPS Take by mouth.   clotrimazole -betamethasone  (LOTRISONE ) cream APPLY TO AFFECTED AREA TWICE A DAY   FeFum-FePoly-FA-B Cmp-C-Biot (INTEGRA PLUS ) CAPS Take 1 each by mouth daily.   Fluticasone-Salmeterol (ADVAIR DISKUS) 250-50 MCG/DOSE AEPB Inhale into the lungs.   furosemide  (LASIX ) 20 MG tablet TAKE 1 TABLET BY MOUTH EVERY DAY   Ibuprofen (ADVIL) 200 MG CAPS Take by mouth.   lansoprazole (PREVACID) 15 MG capsule TAKE 1 CAPSULE BY MOUTH TWICE DAILY 30 MINUTES BEFORE MEALS.   metFORMIN (GLUCOPHAGE-XR) 500 MG 24 hr tablet TAKE 1 TABLET BY MOUTH EVERY DAY   Polyethyl Glycol-Propyl Glycol (SYSTANE OP) Apply to eye as needed.   rosuvastatin (CRESTOR) 20 MG tablet TAKE 1 TABLET BY MOUTH EVERY DAY   SINGULAIR 10 MG tablet TAKE 1 TABLET BY MOUTH EVERY DAY   timolol  (BETIMOL ) 0.5 % ophthalmic solution Place 1 drop into both eyes 2 (two) times daily.   Triamcinolone  Acetonide (TRIAMCINOLONE  0.1 % CREAM : EUCERIN) CREA Apply 1 Application topically 2 (two) times daily.   VASCEPA 1 g capsule TAKE 2 CAPSULES BY MOUTH TWICE A DAY   Cholecalciferol (VITAMIN D3) 50000 units TABS Take by mouth once a week. (Patient not taking: Reported on 08/24/2023)   gabapentin (NEURONTIN) 300 MG capsule TAKE 1 CAPSULE BY MOUTH EVERYDAY AT BEDTIME (Patient not taking: Reported on 08/24/2023)   [DISCONTINUED] lansoprazole (PREVACID) 30 MG capsule daily. (Patient not taking: Reported on 08/24/2023)   [DISCONTINUED] valsartan  (DIOVAN ) 80 MG tablet Take 2 tablets (160 mg total) by mouth daily. (Patient not taking: Reported on 08/24/2023)   No facility-administered medications prior to visit.    Review of Systems  Constitutional: Negative.  Negative for chills, fever, malaise/fatigue and weight loss.  HENT: Negative.   Negative for congestion and sore throat.   Eyes: Negative.   Respiratory: Negative.  Negative for cough and shortness of breath.   Cardiovascular: Negative.  Negative for chest pain, palpitations and leg swelling.  Gastrointestinal: Negative.  Negative for abdominal pain, constipation, diarrhea, heartburn, nausea and vomiting.  Genitourinary: Negative.  Negative for dysuria and flank pain.  Musculoskeletal:  Positive for back pain and joint pain. Negative for myalgias.  Skin: Negative.   Neurological: Negative.  Negative for dizziness, tingling, tremors and headaches.  Endo/Heme/Allergies: Negative.   Psychiatric/Behavioral: Negative.  Negative for depression and suicidal ideas. The patient is not nervous/anxious.        Objective:  BP 131/69 Comment: automatic wrist cuff  Pulse 77   Ht 5' 1 (1.549 m)   Wt 183 lb (83 kg)   SpO2 96%   BMI 34.58 kg/m   Vitals:   08/24/23 1301 08/24/23 1317  BP: (!) 150/74 Comment: manual 131/69 Comment: automatic wrist cuff  Pulse: 77   Height: 5' 1 (1.549 m)   Weight: 183 lb (83 kg)   SpO2: 96%   BMI (Calculated): 34.6     Physical Exam Vitals and nursing note reviewed.  Constitutional:      Appearance: Normal appearance.  HENT:     Head: Normocephalic and atraumatic.     Nose: Nose normal.     Mouth/Throat:     Mouth: Mucous membranes are moist.     Pharynx: Oropharynx is clear.   Eyes:     Conjunctiva/sclera: Conjunctivae normal.     Pupils: Pupils are equal, round, and reactive to light.    Cardiovascular:     Rate and Rhythm: Normal rate and regular rhythm.     Pulses: Normal pulses.     Heart sounds: Normal heart sounds. No murmur heard. Pulmonary:     Effort: Pulmonary effort is normal.     Breath sounds: Normal breath sounds. No wheezing.  Abdominal:     General: Bowel sounds are normal.     Palpations: Abdomen is soft.     Tenderness: There is no abdominal tenderness. There is no right CVA tenderness or left  CVA tenderness.   Musculoskeletal:        General: Normal range of motion.     Cervical back: Normal range of motion.     Right lower leg: No edema.     Left lower leg: No edema.   Skin:    General: Skin is warm and dry.   Neurological:     General: No focal deficit present.     Mental Status: She is alert and oriented to person, place, and time.   Psychiatric:        Mood and Affect: Mood normal.        Behavior: Behavior normal.      No results found for any visits on 08/24/23.  No results found for this or any previous visit (from the past 2160 hours).    Assessment & Plan:  Patient will monitor her blood pressure at home.  If needed will resume valsartan .  Meanwhile take her Lasix  on Monday Wednesdays and Fridays.  Physical therapy for lower back pain and muscle strengthening exercises. Problem List Items Addressed This Visit     Type 2 diabetes mellitus without complication, without long-term current use of insulin (HCC)   Relevant Orders   Hemoglobin A1c   Essential hypertension, benign   Relevant Orders   CBC With Diff/Platelet   Hemoglobin A1c   Hypertension associated with diabetes (HCC) - Primary   Relevant Orders   CMP14+EGFR   Combined hyperlipidemia associated with type 2 diabetes mellitus (HCC)   Relevant Orders   Lipid Profile   Other Visit Diagnoses       Chronic bilateral low back pain with right-sided sciatica       Relevant Orders   Ambulatory referral to Physical Therapy       Return in about 3 months (around 11/24/2023).   Total time spent: 30 minutes  Aisha Hove, MD  08/24/2023   This document may have been prepared by [facility] Voice Recognition software and as such may include unintentional dictation errors.

## 2023-08-25 ENCOUNTER — Ambulatory Visit: Admitting: Occupational Therapy

## 2023-08-25 ENCOUNTER — Encounter: Payer: Self-pay | Admitting: Occupational Therapy

## 2023-08-25 DIAGNOSIS — I890 Lymphedema, not elsewhere classified: Secondary | ICD-10-CM | POA: Diagnosis not present

## 2023-08-25 LAB — CMP14+EGFR
ALT: 13 IU/L (ref 0–32)
AST: 24 IU/L (ref 0–40)
Albumin: 4.3 g/dL (ref 3.8–4.8)
Alkaline Phosphatase: 77 IU/L (ref 44–121)
BUN/Creatinine Ratio: 13 (ref 12–28)
BUN: 14 mg/dL (ref 8–27)
Bilirubin Total: 0.4 mg/dL (ref 0.0–1.2)
CO2: 23 mmol/L (ref 20–29)
Calcium: 9.5 mg/dL (ref 8.7–10.3)
Chloride: 99 mmol/L (ref 96–106)
Creatinine, Ser: 1.11 mg/dL — ABNORMAL HIGH (ref 0.57–1.00)
Globulin, Total: 2.1 g/dL (ref 1.5–4.5)
Glucose: 90 mg/dL (ref 70–99)
Potassium: 4 mmol/L (ref 3.5–5.2)
Sodium: 140 mmol/L (ref 134–144)
Total Protein: 6.4 g/dL (ref 6.0–8.5)
eGFR: 52 mL/min/{1.73_m2} — ABNORMAL LOW (ref >59)

## 2023-08-25 LAB — LIPID PANEL
Chol/HDL Ratio: 2.3 ratio (ref 0.0–4.4)
Cholesterol, Total: 154 mg/dL (ref 100–199)
HDL: 66 mg/dL (ref >39)
LDL Chol Calc (NIH): 74 mg/dL (ref 0–99)
Triglycerides: 74 mg/dL (ref 0–149)
VLDL Cholesterol Cal: 14 mg/dL (ref 5–40)

## 2023-08-25 NOTE — Therapy (Signed)
 OUTPATIENT OCCUPATIONAL THERAPY TREATMENT NOTE  BILATERAL LOWER EXTREMITY LYMPHEDEMA  Patient Name: Judith Arnold MRN: [id number] DOB:Aug 02, 1950, 73 y.o., female Today's Date: 08/25/2023  REPORTING PERIOD:    END OF SESSION:   OT End of Session - 08/25/23 1014     Visit Number 12    Number of Visits 36    Date for OT Re-Evaluation 10/10/23    OT Start Time 1010    Activity Tolerance Patient tolerated treatment well;No increased pain;Other (comment)           Past Medical History:  Diagnosis Date   Allergy    Arthritis    lower back, left shoulder   Asthma    Carpal tunnel syndrome on both sides    Dental bridge present    top   Dental crown present    multiple   Family history of adverse reaction to anesthesia    Father and sister - PONV   GERD (gastroesophageal reflux disease)    Glaucoma    Hypertension    Leaky heart valve    Pre-diabetes    Sleep apnea    Past Surgical History:  Procedure Laterality Date   CATARACT EXTRACTION W/PHACO Right 11/16/2016   Procedure: CATARACT EXTRACTION PHACO AND INTRAOCULAR LENS PLACEMENT (IOC)  right;  Surgeon: Annell Kidney, MD;  Location: [facility];  Service: Ophthalmology;  Laterality: Right;  pre diabetic latex sensitivity sleep apnea   CATARACT EXTRACTION W/PHACO Left 01/18/2017   Procedure: CATARACT EXTRACTION PHACO AND INTRAOCULAR LENS PLACEMENT (IOC) LEFT DIABETIC;  Surgeon: Annell Kidney, MD;  Location: [facility];  Service: Ophthalmology;  Laterality: Left;   COLONOSCOPY     COLONOSCOPY WITH PROPOFOL  N/A 04/09/2018   Procedure: COLONOSCOPY WITH PROPOFOL ;  Surgeon: Cassie Click, MD;  Location: [facility];  Service: Endoscopy;  Laterality: N/A;   IRIDOTOMY / IRIDECTOMY Bilateral 2005   TOOTH EXTRACTION     Patient Active Problem List   Diagnosis Date Noted   Hypertension associated with diabetes (HCC) 01/20/2023   Combined hyperlipidemia associated with type 2 diabetes  mellitus (HCC) 01/20/2023   Intertrigo 11/01/2022   Stasis dermatitis of both legs 10/21/2022   Leg swelling 10/21/2022   Lymphedema of lower extremity 10/21/2022   Type 2 diabetes mellitus without complication, without long-term current use of insulin (HCC) 05/24/2022   Mixed hyperlipidemia 05/24/2022   Essential hypertension, benign 05/24/2022   Absolute anemia 05/24/2022   Gastroesophageal reflux disease without esophagitis 05/24/2022   Allergic rhinitis 05/24/2022   Asthma, allergic, moderate persistent, uncomplicated 05/24/2022   Bilateral chronic angle-closure glaucoma, indeterminate stage 05/24/2022   Airway hyperreactivity 08/29/2013   OSA on CPAP 08/29/2013    PCP: Aisha Hove, MD  REFERRING PROVIDER: same  REFERRING DIAG: lymphedema I89.0  THERAPY DIAG:  Lymphedema, not elsewhere classified  Rationale for Evaluation and Treatment: Rehabilitation  ONSET DATE: Pt reports onset of L ankle / leg swelling after injury 20-30 yrs ago. Then about 1 year ago legs started swelling and Pt having prickly sensation on shins and thighs bilaterally.   SUBJECTIVE:  SUBJECTIVE STATEMENT: Judith Arnold presents to OT for treatment of BLE lymphedema 2/2 suspected CVI and obesity. Pt is unaccompanied today. Pt reports pain in proximal thighs 5/10. Pt reports her helper missed one day assisting with wraps, but other than that she comes every day. Pt has no new concerns.  (INITIAL EVAL 07/12/23 : Judith Arnold is referred to Occupational Therapy by Aisha Hove, MD,  for evaluation and treatment of BLE lymphedema.  Pt reports onset of L ankle and distal leg swelling about 30 years ago, then noticed RLE swelling and worsening LLE swelling about 1 year ago at same time she started having unusual  sensation in legs. Pt denies previously having lymphedema treatment. Pt reports paternal aunt had leg swelling. Pt is unable to wear compression stockings because she is unable to reach feet and distal legs to don and doff them due to back and hip pain.)  PERTINENT HISTORY: Relevant to lymphedema (OSA (denies using CPAP), BLE stasis dermatitis, HTN, DM 2 (Pt states she is prediabetic only), B glaucoma, Back and R hip OA  PAIN:  Are you having pain? Yes: NPRS scale: 5/10 Pain location: B legs Pain description: like stickers Aggravating factors: standing, walking, dependent sitting Relieving factors: elevation  PRECAUTIONS: Fall and Other: LYMPHEDEMA precautions  (DM 2 and asthma)  WEIGHT BEARING RESTRICTIONS: No  FALLS:  Has patient fallen in last 6 months? TBA  LIVING ENVIRONMENT: Lives with: TBA Lives in: House/apartment Stairs: No;  Has following equipment at home: Environmental consultant - 4 wheeled  OCCUPATION: retired Scientist, research (life sciences)  LEISURE: reading and taking notes on current events and politics  HAND DOMINANCE: right   PRIOR LEVEL OF FUNCTION: Independent with household mobility with device, Requires assistive device for independence, Needs assistance with ADLs, Needs assistance with homemaking, and Needs assistance with gait  PATIENT GOALS: reduce swelling and keep it from getting worse  OBJECTIVE: Note: Objective measures were completed at Evaluation unless otherwise noted.  COGNITION:  Overall cognitive status: impaired short term  memory   OBSERVATIONS / OTHER ASSESSMENTS: Mild, Stage II, BLE Lymphedema 2/2 suspected venous insufficiency.   SENSATION: Reports uncomfortable scratching, or sticker-like  sensation on anterior thighs   POSTURE: Raised walker handles 2 notches to increase upright spinal alignment when walking to limit falls risk. Handles need to be raised at least 1 hole more for safe upright posture when walking.  Upright sitting posture: head forward ,  shoulders forward and rounded- appears to be flexible kyphosis  LE ROM: WFL for ankles and knees. Pt reports limited hip abduction )  LE MMT: WFL FOR LYMPHEDEMA CARE. Presenting with generalized weakness and Pt reports debility  LYMPHEDEMA ASSESSMENTS:   SURGERY TYPE/DATE: Non-cancer related limb swelling  HX INFECTIONS: positive for 1 episode cellulitis treated w antibiotics  Hx WOUNDS: denies   BLE COMPARATIVE LIMB VOLUMETRICS: INITIAL 07/19/23  LANDMARK RIGHT (dominant)  R LEG (A-D) 3525.1 ml  R THIGH (E-G) ml  R FULL LIMB (A-G) ml  Limb Volume differential (LVD)  %  Volume change since initial %  Volume change overall V  (Blank rows = not tested)  LANDMARK LEFT    L LEG (A-D) 3769.6 ml  L THIGH (E-G) ml  L FULL LIMB (A-G) ml  Limb Volume differential (LVD)  Limb Volume Differential (LVD) measures 6.5%, L>R.  Volume change since initial %  Volume change overall %    RLE COMPARATIVE LIMB VOLUMETRICS: VISIT 9 08/15/23  LANDMARK RIGHT (dominant)  R LEG (A-D) 3549.4  ml  R THIGH (E-G) ml  R FULL LIMB (A-G) ml  Limb Volume differential (LVD)  %  Volume change since initial R LEG volume reduced by 5.84% since initially measured on 07/18/21  Volume change overall V  (Blank rows = not tested)  Mild, Stage  II, Bilateral Lower Extremity Lymphedema 2/2 CVI and Obesity  Skin  Description Hyper-Keratosis Peau' de Orange Shiny Tight Fibrotic/ Indurated Fatty Doughy Spongy/ boggy       R>L x  x   Skin dry Flaky WNL Macerated   mildly      Color Redness Varicosities Blanching Hemosiderin Stain Mottled   x     x   Odor Malodorous Yeast Fungal infection  WNL      x   Temperature Warm Cool wnl    x     Pitting Edema   1+ 2+ 3+ 4+ Non-pitting         x   Girth Symmetrical Asymmetrical                   Distribution    R>L toes to groin    Stemmer Sign Positive Negative   +    Lymphorrhea History Of:  Present Absent     x    Wounds History Of Present  Absent Venous Arterial Pressure Sheer     x        Signs of Infection Redness Warmth Erythema Acute Swelling Drainage Borders                    Sensation Light Touch Deep pressure Hypersensitivity   Present Impaired Present Impaired Absent Impaired   x Tactile  x  x     Nails WNL   Fungus nail dystrophy   x     Hair Growth Symmetrical Asymmetrical   x    Skin Creases Base of toes  Ankles   Base of Fingers knees       Abdominal pannus Thigh Lobules  Face/neck   x x  x      (Blank rows = not tested)    GAIT: Distance walked: >500' Assistive device utilized: Environmental consultant - 4 wheeled Level of assistance: Modified independence Comments: Pt bent at waist nearly 90 degrees with walker handles in lowest position  LYMPHEDEMA LIFE IMPACT SCALE (LLIS): TBA OT Rx visit 1  TREATMENT DATE:  RLE/RLQ MLD with simultaneous skin care as established RLE multilayer compression bandaging Pt edu for lymphedema self-care throughout session    PATIENT EDUCATION:  Continued Pt/ CG edu for lymphedema self care home program throughout session. Topics include outcome of comparative limb volumetrics- starting limb volume differentials (LVDs), technology and gradient techniques used for short stretch, multilayer compression wrapping, simple self-MLD, therapeutic lymphatic pumping exercises, skin/nail care, LE precautions, compression garment recommendations and specifications, wear and care schedule and compression garment donning / doffing w assistive devices. Discussed progress towards all OT goals since commencing CDT. Discussed detrimental impact of obesity on lower and upper extremity lymphedema over time. Reviewed OT goals for lymphedema care with Pt and discussed progress to date.  All questions answered to the Pt's satisfaction. Good return. Person educated: Patient  Education method: Explanation, Demonstration, and Handouts Education comprehension: verbalized understanding, returned  demonstration, verbal cues required, and needs further education   HOME EXERCISE PROGRAM: BLE lymphatic pumping there ex using- 1 sets of 10 reps, each exercise in order-  1-2 x daily, bilaterally Simple self MLD 1 x daily Daily skin  care to increase hydration, skin mobility and decrease infection risk- can be done during MLD During Intensive Phase CDT: Compression wraps 23/7 until limb volume reduction complete During Self-management Phase CDT: Fit with appropriate compression garments or alternatives. Consider BLE, knee length, Mediven, custom, CircAid , Velcro style leggings over soft cotton liners.  ASSESSMENT: CLINICAL IMPRESSION: Pt continues to demonstrate ongoing progress towards all goals. Continued MLD to RLE/RLQ utilizing functional inguinal LN, deep abdominal pathways and modified short neck sequence to avoid thyroid region (No strokes to lateral neck and very gentle strokes to R and L of sternal notch over terminus. Pt tolerated manual therapy without increased pain. Provided skin care to limit infection risk using castor oil to increase skin excursion during MLD. Pt educated throughout session on lymphedema self care. Reapplied multilayer, knee length compression wraps, adding newly fabricated, custom , Comprex foam kidneys be hind   each malleolus.. Reviewed bandaging reviewing simultaneously with wrapping. Cont as per POC.  (INITIAL EVAL 07/12/23: Marshell Denton Flakes is a 73 yo female presenting with chronic, progressive, BLE swelling 2/2 unknown etiology, which at first glance appears to be circulatory in nature due to skin coloring at distal legs where swelling is most invested. Pt is presently unable to wear traditional elastic compression garments due to difficulty donning and doffing them.  Chronic, progressive lymphedema with associated skin changes, including fibrosis, limits this patent's functional performance in all occupational domains, including functional ambulation and  mobility, basic and instrumental ADLs (lower body dressing, LB bathing, fitting street shoes and LB clothing, driving, shopping, and home management. It also limits perform productive activities, leisure pursuits, and participation in social and community activities. BLE lymphedema contributes to elevated infection risk. Pt will benefit from skilled OT for Complete Decongestive Therapy (CDT), which  typically includes manual lymphatic drainage (MLD), skin care to limit' infection risk and increase skin excursion, lymphatic pumping exercise, and during the Intensive Phase multilayer, gradient compression bandaging to reduce limb volume. Once limb volume is reduced as much as possible, Pt is fitted with appropriate compression garments and/ or devices and transitions into the self management phase of care consisting of follow long support PRN.    Pt understands that her fair prognosis will become poor without daily assistance with compression wrapping since she is unable to reach feet and legs to apply them herself. Pt assures OT that she has a friend she believes is willing to assist her between OT sessions. )   OBJECTIVE IMPAIRMENTS: decreased standing and walking tolerance, activity tolerance, decreased balance, decreased knowledge of condition, decreased knowledge of use of DME, decreased mobility, decreased ROM, decreased strength, increased edema, impaired flexibility, impaired sensation, impaired UE functional use, impaired vision/reception, pain, and chronic, progressive, BLE swelling and associated skin changes, at increased infection risk   ACTIVITY LIMITATIONS: Functional ambulation and mobility (lifting, carrying, steps/stairs, transfers, squatting) , Basic and instrumental ADLs, leisure pursuits, productive activities, social participation  PARTICIPATION LIMITATIONS: meal prep, cleaning, laundry, driving, shopping, yard work, and    PERSONAL FACTORS: Age, Fitness, Past/current experiences, Time  since onset of injury/illness/exacerbation, 2+ co morbidities: OSA, HTN,  are also affecting patient's functional outcome.   REHAB POTENTIAL: Good  EVALUATION COMPLEXITY: Moderate   GOALS: Goals reviewed with patient? Yes  SHORT TERM GOALS: Target date: 4th OT Rx visit   Pt will demonstrate understanding of lymphedema precautions and prevention strategies with modified independence using a printed reference to identify at least 5 precautions and discussing how s/he may implement them into daily  life to reduce risk of progression with extra time. Baseline:Max A Goal status: GOAL MET  2.  Pt will be able to apply multilayer, knee length, gradient, compression wraps to one leg at a time from toes to below knee with max caregiver assist to decrease limb volume, to limit infection risk, and to limit lymphedema progression.  Baseline: Dependent Goal status: GOAL MET  LONG TERM GOALS: Target date: 09/19/23  1.Given this patient's Intake score of TBA % on the Lymphedema Life Impact Scale (LLIS), patient will experience a reduction of at least 5 points in her perceived level of functional impairment resulting from lymphedema to improve functional performance and quality of life (QOL). Baseline: TBA % Goal status:  GOAL DEFERRED  2.  Pt will achieve at least a 10% volume reduction in B legs to return limb to typical size and shape, to limit infection risk and LE progression, to decrease pain, to improve function. Baseline: Dependent Goal status: PROGRESSING. R LEG volume reduced by 5.84% since initially measured on 07/18/21  3.  Pt will obtain appropriate compression garments/devices and achieve modified independence (extra time + assistive devices) with donning/doffing to optimize limb volume reductions and limit LE progression over time. Baseline: Dependent Goal status: PROGRESSING  During Intensive phase CDT, with max CG assistance, Pt will achieve at least 85% compliance with all adapted  lymphedema self-care home program components, including daily skin care, compression wraps and /or garments, simple self MLD and lymphatic pumping therex to habituate LE self care protocol  into ADLs for optimal LE self-management over time. Baseline: Dependent Goal status:PROGRESSING  PLAN:  OT FREQUENCY: 2 x/week  OT DURATION: 12 weeks  PLANNED INTERVENTIONS:compression bandaging, skin care,  97110-Therapeutic exercises, 97530- Therapeutic activity, 97535- Self Care, Manual lymph drainage, DME instructions, and fit with appropriate compression  PLAN FOR NEXT SESSION:  RLE MLD and skin care RLE multilayer compression bandaging Cont Pt edu for LE self-care   Arnold Bicker, MS, OTR/L, CLT-LANA 08/25/23 10:16 AM

## 2023-08-28 ENCOUNTER — Ambulatory Visit: Payer: Self-pay | Admitting: Internal Medicine

## 2023-08-29 ENCOUNTER — Ambulatory Visit: Admitting: Occupational Therapy

## 2023-08-31 ENCOUNTER — Ambulatory Visit: Admitting: Occupational Therapy

## 2023-09-05 ENCOUNTER — Encounter: Admitting: Occupational Therapy

## 2023-09-07 ENCOUNTER — Encounter: Admitting: Occupational Therapy

## 2023-09-12 ENCOUNTER — Ambulatory Visit: Attending: Internal Medicine | Admitting: Occupational Therapy

## 2023-09-12 ENCOUNTER — Encounter: Payer: Self-pay | Admitting: Occupational Therapy

## 2023-09-12 DIAGNOSIS — R278 Other lack of coordination: Secondary | ICD-10-CM | POA: Diagnosis not present

## 2023-09-12 DIAGNOSIS — R262 Difficulty in walking, not elsewhere classified: Secondary | ICD-10-CM | POA: Insufficient documentation

## 2023-09-12 DIAGNOSIS — M5459 Other low back pain: Secondary | ICD-10-CM | POA: Insufficient documentation

## 2023-09-12 DIAGNOSIS — M6281 Muscle weakness (generalized): Secondary | ICD-10-CM | POA: Diagnosis not present

## 2023-09-12 DIAGNOSIS — I890 Lymphedema, not elsewhere classified: Secondary | ICD-10-CM | POA: Insufficient documentation

## 2023-09-12 DIAGNOSIS — M25551 Pain in right hip: Secondary | ICD-10-CM | POA: Insufficient documentation

## 2023-09-12 NOTE — Therapy (Signed)
 OUTPATIENT OCCUPATIONAL THERAPY TREATMENT NOTE  BILATERAL LOWER EXTREMITY LYMPHEDEMA  Patient Name: Judith Arnold MRN: [id number] DOB:05-01-1950, 73 y.o., female Today's Date: 09/12/2023  REPORTING PERIOD:    END OF SESSION:   OT End of Session - 09/12/23 1514     Visit Number 13    Number of Visits 36    Date for OT Re-Evaluation 10/10/23    OT Start Time 0300    OT Stop Time 0400    OT Time Calculation (min) 60 min    Activity Tolerance Patient tolerated treatment well;No increased pain    Behavior During Therapy WFL for tasks assessed/performed           Past Medical History:  Diagnosis Date   Allergy    Arthritis    lower back, left shoulder   Asthma    Carpal tunnel syndrome on both sides    Dental bridge present    top   Dental crown present    multiple   Family history of adverse reaction to anesthesia    Father and sister - PONV   GERD (gastroesophageal reflux disease)    Glaucoma    Hypertension    Leaky heart valve    Pre-diabetes    Sleep apnea    Past Surgical History:  Procedure Laterality Date   CATARACT EXTRACTION W/PHACO Right 11/16/2016   Procedure: CATARACT EXTRACTION PHACO AND INTRAOCULAR LENS PLACEMENT (IOC)  right;  Surgeon: Mittie Gaskin, MD;  Location: [facility];  Service: Ophthalmology;  Laterality: Right;  pre diabetic latex sensitivity sleep apnea   CATARACT EXTRACTION W/PHACO Left 01/18/2017   Procedure: CATARACT EXTRACTION PHACO AND INTRAOCULAR LENS PLACEMENT (IOC) LEFT DIABETIC;  Surgeon: Mittie Gaskin, MD;  Location: [facility];  Service: Ophthalmology;  Laterality: Left;   COLONOSCOPY     COLONOSCOPY WITH PROPOFOL  N/A 04/09/2018   Procedure: COLONOSCOPY WITH PROPOFOL ;  Surgeon: Viktoria Lamar DASEN, MD;  Location: [facility];  Service: Endoscopy;  Laterality: N/A;   IRIDOTOMY / IRIDECTOMY Bilateral 2005   TOOTH EXTRACTION     Patient Active Problem List   Diagnosis Date Noted   Hypertension  associated with diabetes (HCC) 01/20/2023   Combined hyperlipidemia associated with type 2 diabetes mellitus (HCC) 01/20/2023   Intertrigo 11/01/2022   Stasis dermatitis of both legs 10/21/2022   Leg swelling 10/21/2022   Lymphedema of lower extremity 10/21/2022   Type 2 diabetes mellitus without complication, without long-term current use of insulin (HCC) 05/24/2022   Mixed hyperlipidemia 05/24/2022   Essential hypertension, benign 05/24/2022   Absolute anemia 05/24/2022   Gastroesophageal reflux disease without esophagitis 05/24/2022   Allergic rhinitis 05/24/2022   Asthma, allergic, moderate persistent, uncomplicated 05/24/2022   Bilateral chronic angle-closure glaucoma, indeterminate stage 05/24/2022   Airway hyperreactivity 08/29/2013   OSA on CPAP 08/29/2013    PCP: Fredy GORMAN Bathe, MD  REFERRING PROVIDER: same  REFERRING DIAG: lymphedema I89.0  THERAPY DIAG:  Lymphedema, not elsewhere classified  Rationale for Evaluation and Treatment: Rehabilitation  ONSET DATE: Pt reports onset of L ankle / leg swelling after injury 20-30 yrs ago. Then about 1 year ago legs started swelling and Pt having prickly sensation on shins and thighs bilaterally.   SUBJECTIVE:  SUBJECTIVE STATEMENT: Judith Arnold presents to OT for treatment of BLE lymphedema 2/2 suspected CVI and obesity. Pt is unaccompanied today. Pt reports pain in proximal thighs 5/10. Pt was last seen for LE care on 08/25/23. She took a break from treatment to assist with her brother's care after a cardiac procedure. Pt reports her sister wrapped her legs daily between visits.  (INITIAL EVAL 07/12/23 : Judith Arnold is referred to Occupational Therapy by Fredy GORMAN Bathe, MD,  for evaluation and treatment of BLE lymphedema.  Pt reports  onset of L ankle and distal leg swelling about 30 years ago, then noticed RLE swelling and worsening LLE swelling about 1 year ago at same time she started having unusual sensation in legs. Pt denies previously having lymphedema treatment. Pt reports paternal aunt had leg swelling. Pt is unable to wear compression stockings because she is unable to reach feet and distal legs to don and doff them due to back and hip pain.)  PERTINENT HISTORY: Relevant to lymphedema (OSA (denies using CPAP), BLE stasis dermatitis, HTN, DM 2 (Pt states she is prediabetic only), B glaucoma, Back and R hip OA  PAIN:  Are you having pain? Yes: NPRS scale: 5/10 Pain location: B legs Pain description: like stickers Aggravating factors: standing, walking, dependent sitting Relieving factors: elevation  PRECAUTIONS: Fall and Other: LYMPHEDEMA precautions  (DM 2 and asthma)  WEIGHT BEARING RESTRICTIONS: No  FALLS:  Has patient fallen in last 6 months? TBA  LIVING ENVIRONMENT: Lives with: TBA Lives in: House/apartment Stairs: No;  Has following equipment at home: Environmental consultant - 4 wheeled  OCCUPATION: retired Scientist, research (life sciences)  LEISURE: reading and taking notes on current events and politics  HAND DOMINANCE: right   PRIOR LEVEL OF FUNCTION: Independent with household mobility with device, Requires assistive device for independence, Needs assistance with ADLs, Needs assistance with homemaking, and Needs assistance with gait  PATIENT GOALS: reduce swelling and keep it from getting worse  OBJECTIVE: Note: Objective measures were completed at Evaluation unless otherwise noted.  COGNITION:  Overall cognitive status: impaired short term  memory   OBSERVATIONS / OTHER ASSESSMENTS: Mild, Stage II, BLE Lymphedema 2/2 suspected venous insufficiency.   SENSATION: Reports uncomfortable scratching, or sticker-like  sensation on anterior thighs   POSTURE: Raised walker handles 2 notches to increase upright spinal  alignment when walking to limit falls risk. Handles need to be raised at least 1 hole more for safe upright posture when walking.  Upright sitting posture: head forward , shoulders forward and rounded- appears to be flexible kyphosis  LE ROM: WFL for ankles and knees. Pt reports limited hip abduction )  LE MMT: WFL FOR LYMPHEDEMA CARE. Presenting with generalized weakness and Pt reports debility  LYMPHEDEMA ASSESSMENTS:   SURGERY TYPE/DATE: Non-cancer related limb swelling  HX INFECTIONS: positive for 1 episode cellulitis treated w antibiotics  Hx WOUNDS: denies   BLE COMPARATIVE LIMB VOLUMETRICS: INITIAL 07/19/23  LANDMARK RIGHT (dominant)  R LEG (A-D) 3525.1 ml  R THIGH (E-G) ml  R FULL LIMB (A-G) ml  Limb Volume differential (LVD)  %  Volume change since initial %  Volume change overall V  (Blank rows = not tested)  LANDMARK LEFT    L LEG (A-D) 3769.6 ml  L THIGH (E-G) ml  L FULL LIMB (A-G) ml  Limb Volume differential (LVD)  Limb Volume Differential (LVD) measures 6.5%, L>R.  Volume change since initial %  Volume change overall %    RLE COMPARATIVE LIMB VOLUMETRICS:  VISIT 9 08/15/23  LANDMARK RIGHT (dominant)  R LEG (A-D) 3549.4 ml  R THIGH (E-G) ml  R FULL LIMB (A-G) ml  Limb Volume differential (LVD)  %  Volume change since initial R LEG volume reduced by 5.84% since initially measured on 07/18/21  Volume change overall V  (Blank rows = not tested)  Mild, Stage  II, Bilateral Lower Extremity Lymphedema 2/2 CVI and Obesity  Skin  Description Hyper-Keratosis Peau' de Orange Shiny Tight Fibrotic/ Indurated Fatty Doughy Spongy/ boggy       R>L x  x   Skin dry Flaky WNL Macerated   mildly      Color Redness Varicosities Blanching Hemosiderin Stain Mottled   x     x   Odor Malodorous Yeast Fungal infection  WNL      x   Temperature Warm Cool wnl    x     Pitting Edema   1+ 2+ 3+ 4+ Non-pitting         x   Girth Symmetrical Asymmetrical                    Distribution    R>L toes to groin    Stemmer Sign Positive Negative   +    Lymphorrhea History Of:  Present Absent     x    Wounds History Of Present Absent Venous Arterial Pressure Sheer     x        Signs of Infection Redness Warmth Erythema Acute Swelling Drainage Borders                    Sensation Light Touch Deep pressure Hypersensitivity   Present Impaired Present Impaired Absent Impaired   x Tactile  x  x     Nails WNL   Fungus nail dystrophy   x     Hair Growth Symmetrical Asymmetrical   x    Skin Creases Base of toes  Ankles   Base of Fingers knees       Abdominal pannus Thigh Lobules  Face/neck   x x  x      (Blank rows = not tested)    GAIT: Distance walked: >500' Assistive device utilized: Environmental consultant - 4 wheeled Level of assistance: Modified independence Comments: Pt bent at waist nearly 90 degrees with walker handles in lowest position  LYMPHEDEMA LIFE IMPACT SCALE (LLIS): TBA OT Rx visit 1  TREATMENT DATE:  RLE/RLQ MLD with simultaneous skin care as established RLE multilayer compression bandaging Pt edu for lymphedema self-care throughout session    PATIENT EDUCATION:  Continued Pt/ CG edu for lymphedema self care home program throughout session. Topics include outcome of comparative limb volumetrics- starting limb volume differentials (LVDs), technology and gradient techniques used for short stretch, multilayer compression wrapping, simple self-MLD, therapeutic lymphatic pumping exercises, skin/nail care, LE precautions, compression garment recommendations and specifications, wear and care schedule and compression garment donning / doffing w assistive devices. Discussed progress towards all OT goals since commencing CDT. Discussed detrimental impact of obesity on lower and upper extremity lymphedema over time. Reviewed OT goals for lymphedema care with Pt and discussed progress to date.  All questions answered to the Pt's satisfaction.  Good return. Person educated: Patient  Education method: Explanation, Demonstration, and Handouts Education comprehension: verbalized understanding, returned demonstration, verbal cues required, and needs further education   HOME EXERCISE PROGRAM: BLE lymphatic pumping there ex using- 1 sets of 10 reps, each exercise in order-  1-2 x daily, bilaterally Simple self MLD 1 x daily Daily skin care to increase hydration, skin mobility and decrease infection risk- can be done during MLD During Intensive Phase CDT: Compression wraps 23/7 until limb volume reduction complete During Self-management Phase CDT: Fit with appropriate compression garments or alternatives. Consider BLE, knee length, Mediven, custom, CircAid , Velcro style leggings over soft cotton liners.  ASSESSMENT: CLINICAL IMPRESSION: Pt continues to demonstrate ongoing progress towards all goals. Continued MLD to RLE/RLQ utilizing functional inguinal LN, deep abdominal pathways and modified short neck sequence to avoid thyroid region (No strokes to lateral neck and very gentle strokes to R and L of sternal notch over terminus. Pt tolerated manual therapy without increased pain. Provided skin care to limit infection risk using castor oil to increase skin excursion during MLD. Pt educated throughout session on lymphedema self care. Reapplied multilayer, knee length compression wraps, adding newly fabricated, custom , Comprex foam kidneys be hind   each malleolus.. Reviewed bandaging reviewing simultaneously with wrapping. Cont as per POC.  (INITIAL EVAL 07/12/23: Judith Arnold is a 73 yo female presenting with chronic, progressive, BLE swelling 2/2 unknown etiology, which at first glance appears to be circulatory in nature due to skin coloring at distal legs where swelling is most invested. Pt is presently unable to wear traditional elastic compression garments due to difficulty donning and doffing them.  Chronic, progressive  lymphedema with associated skin changes, including fibrosis, limits this patent's functional performance in all occupational domains, including functional ambulation and mobility, basic and instrumental ADLs (lower body dressing, LB bathing, fitting street shoes and LB clothing, driving, shopping, and home management. It also limits perform productive activities, leisure pursuits, and participation in social and community activities. BLE lymphedema contributes to elevated infection risk. Pt will benefit from skilled OT for Complete Decongestive Therapy (CDT), which  typically includes manual lymphatic drainage (MLD), skin care to limit' infection risk and increase skin excursion, lymphatic pumping exercise, and during the Intensive Phase multilayer, gradient compression bandaging to reduce limb volume. Once limb volume is reduced as much as possible, Pt is fitted with appropriate compression garments and/ or devices and transitions into the self management phase of care consisting of follow long support PRN.    Pt understands that her fair prognosis will become poor without daily assistance with compression wrapping since she is unable to reach feet and legs to apply them herself. Pt assures OT that she has a friend she believes is willing to assist her between OT sessions. )   OBJECTIVE IMPAIRMENTS: decreased standing and walking tolerance, activity tolerance, decreased balance, decreased knowledge of condition, decreased knowledge of use of DME, decreased mobility, decreased ROM, decreased strength, increased edema, impaired flexibility, impaired sensation, impaired UE functional use, impaired vision/reception, pain, and chronic, progressive, BLE swelling and associated skin changes, at increased infection risk   ACTIVITY LIMITATIONS: Functional ambulation and mobility (lifting, carrying, steps/stairs, transfers, squatting) , Basic and instrumental ADLs, leisure pursuits, productive activities, social  participation  PARTICIPATION LIMITATIONS: meal prep, cleaning, laundry, driving, shopping, yard work, and    PERSONAL FACTORS: Age, Fitness, Past/current experiences, Time since onset of injury/illness/exacerbation, 2+ co morbidities: OSA, HTN,  are also affecting patient's functional outcome.   REHAB POTENTIAL: Good  EVALUATION COMPLEXITY: Moderate   GOALS: Goals reviewed with patient? Yes  SHORT TERM GOALS: Target date: 4th OT Rx visit   Pt will demonstrate understanding of lymphedema precautions and prevention strategies with modified independence using a printed reference to identify at  least 5 precautions and discussing how s/he may implement them into daily life to reduce risk of progression with extra time. Baseline:Max A Goal status: GOAL MET  2.  Pt will be able to apply multilayer, knee length, gradient, compression wraps to one leg at a time from toes to below knee with max caregiver assist to decrease limb volume, to limit infection risk, and to limit lymphedema progression.  Baseline: Dependent Goal status: GOAL MET  LONG TERM GOALS: Target date: 09/19/23  1.Given this patient's Intake score of TBA % on the Lymphedema Life Impact Scale (LLIS), patient will experience a reduction of at least 5 points in her perceived level of functional impairment resulting from lymphedema to improve functional performance and quality of life (QOL). Baseline: TBA % Goal status:  GOAL DEFERRED  2.  Pt will achieve at least a 10% volume reduction in B legs to return limb to typical size and shape, to limit infection risk and LE progression, to decrease pain, to improve function. Baseline: Dependent Goal status: PROGRESSING. R LEG volume reduced by 5.84% since initially measured on 07/18/21  3.  Pt will obtain appropriate compression garments/devices and achieve modified independence (extra time + assistive devices) with donning/doffing to optimize limb volume reductions and limit LE  progression over time. Baseline: Dependent Goal status: PROGRESSING  During Intensive phase CDT, with max CG assistance, Pt will achieve at least 85% compliance with all adapted lymphedema self-care home program components, including daily skin care, compression wraps and /or garments, simple self MLD and lymphatic pumping therex to habituate LE self care protocol  into ADLs for optimal LE self-management over time. Baseline: Dependent Goal status:PROGRESSING  PLAN:  OT FREQUENCY: 2 x/week  OT DURATION: 12 weeks  PLANNED INTERVENTIONS:compression bandaging, skin care,  97110-Therapeutic exercises, 97530- Therapeutic activity, 97535- Self Care, Manual lymph drainage, DME instructions, and fit with appropriate compression  PLAN FOR NEXT SESSION:  RLE MLD and skin care RLE multilayer compression bandaging Cont Pt edu for LE self-care   Zebedee Dec, MS, OTR/L, CLT-LANA 09/12/23 4:00 PM

## 2023-09-13 ENCOUNTER — Other Ambulatory Visit: Payer: Self-pay | Admitting: Internal Medicine

## 2023-09-13 DIAGNOSIS — E782 Mixed hyperlipidemia: Secondary | ICD-10-CM

## 2023-09-14 ENCOUNTER — Ambulatory Visit: Admitting: Occupational Therapy

## 2023-09-14 DIAGNOSIS — M6281 Muscle weakness (generalized): Secondary | ICD-10-CM | POA: Diagnosis not present

## 2023-09-14 DIAGNOSIS — M25551 Pain in right hip: Secondary | ICD-10-CM | POA: Diagnosis not present

## 2023-09-14 DIAGNOSIS — I890 Lymphedema, not elsewhere classified: Secondary | ICD-10-CM

## 2023-09-14 DIAGNOSIS — R278 Other lack of coordination: Secondary | ICD-10-CM | POA: Diagnosis not present

## 2023-09-14 DIAGNOSIS — R262 Difficulty in walking, not elsewhere classified: Secondary | ICD-10-CM | POA: Diagnosis not present

## 2023-09-14 DIAGNOSIS — M5459 Other low back pain: Secondary | ICD-10-CM | POA: Diagnosis not present

## 2023-09-14 NOTE — Therapy (Signed)
 OUTPATIENT OCCUPATIONAL THERAPY TREATMENT NOTE  BILATERAL LOWER EXTREMITY LYMPHEDEMA  Patient Name: Judith Arnold MRN: [id number] DOB:Jul 02, 1950, 73 y.o., female Today's Date: 09/14/2023  REPORTING PERIOD:    END OF SESSION:   OT End of Session - 09/14/23 1612     Visit Number 14    Number of Visits 36    Date for OT Re-Evaluation 10/10/23    OT Start Time 0100    OT Stop Time 0200    OT Time Calculation (min) 60 min    Activity Tolerance Patient tolerated treatment well;No increased pain    Behavior During Therapy WFL for tasks assessed/performed           Past Medical History:  Diagnosis Date   Allergy    Arthritis    lower back, left shoulder   Asthma    Carpal tunnel syndrome on both sides    Dental bridge present    top   Dental crown present    multiple   Family history of adverse reaction to anesthesia    Father and sister - PONV   GERD (gastroesophageal reflux disease)    Glaucoma    Hypertension    Leaky heart valve    Pre-diabetes    Sleep apnea    Past Surgical History:  Procedure Laterality Date   CATARACT EXTRACTION W/PHACO Right 11/16/2016   Procedure: CATARACT EXTRACTION PHACO AND INTRAOCULAR LENS PLACEMENT (IOC)  right;  Surgeon: Mittie Gaskin, MD;  Location: [facility];  Service: Ophthalmology;  Laterality: Right;  pre diabetic latex sensitivity sleep apnea   CATARACT EXTRACTION W/PHACO Left 01/18/2017   Procedure: CATARACT EXTRACTION PHACO AND INTRAOCULAR LENS PLACEMENT (IOC) LEFT DIABETIC;  Surgeon: Mittie Gaskin, MD;  Location: [facility];  Service: Ophthalmology;  Laterality: Left;   COLONOSCOPY     COLONOSCOPY WITH PROPOFOL  N/A 04/09/2018   Procedure: COLONOSCOPY WITH PROPOFOL ;  Surgeon: Viktoria Lamar DASEN, MD;  Location: [facility];  Service: Endoscopy;  Laterality: N/A;   IRIDOTOMY / IRIDECTOMY Bilateral 2005   TOOTH EXTRACTION     Patient Active Problem List   Diagnosis Date Noted   Hypertension  associated with diabetes (HCC) 01/20/2023   Combined hyperlipidemia associated with type 2 diabetes mellitus (HCC) 01/20/2023   Intertrigo 11/01/2022   Stasis dermatitis of both legs 10/21/2022   Leg swelling 10/21/2022   Lymphedema of lower extremity 10/21/2022   Type 2 diabetes mellitus without complication, without long-term current use of insulin (HCC) 05/24/2022   Mixed hyperlipidemia 05/24/2022   Essential hypertension, benign 05/24/2022   Absolute anemia 05/24/2022   Gastroesophageal reflux disease without esophagitis 05/24/2022   Allergic rhinitis 05/24/2022   Asthma, allergic, moderate persistent, uncomplicated 05/24/2022   Bilateral chronic angle-closure glaucoma, indeterminate stage 05/24/2022   Airway hyperreactivity 08/29/2013   OSA on CPAP 08/29/2013    PCP: Fredy GORMAN Bathe, MD  REFERRING PROVIDER: same  REFERRING DIAG: lymphedema I89.0  THERAPY DIAG:  Lymphedema, not elsewhere classified  Rationale for Evaluation and Treatment: Rehabilitation  ONSET DATE: Pt reports onset of L ankle / leg swelling after injury 20-30 yrs ago. Then about 1 year ago legs started swelling and Pt having prickly sensation on shins and thighs bilaterally.   SUBJECTIVE:  SUBJECTIVE STATEMENT: Judith Arnold presents to OT for treatment of BLE lymphedema 2/2 suspected CVI and obesity. Pt is unaccompanied today. Pt reports pain in proximal thighs 5/10. Pt was last seen for LE care on 08/25/23. She took a break from treatment to assist with her brother's care after a cardiac procedure. Pt reports her sister wrapped her legs daily between visits.  (INITIAL EVAL 07/12/23 : [name] is referred to Occupational Therapy by Fredy GORMAN Bathe, MD,  for evaluation and treatment of BLE lymphedema.  Pt reports  onset of L ankle and distal leg swelling about 30 years ago, then noticed RLE swelling and worsening LLE swelling about 1 year ago at same time she started having unusual sensation in legs. Pt denies previously having lymphedema treatment. Pt reports paternal aunt had leg swelling. Pt is unable to wear compression stockings because she is unable to reach feet and distal legs to don and doff them due to back and hip pain.)  PERTINENT HISTORY: Relevant to lymphedema (OSA (denies using CPAP), BLE stasis dermatitis, HTN, DM 2 (Pt states she is prediabetic only), B glaucoma, Back and R hip OA  PAIN:  Are you having pain? Yes: NPRS scale: 5/10 Pain location: B legs Pain description: like stickers Aggravating factors: standing, walking, dependent sitting Relieving factors: elevation  PRECAUTIONS: Fall and Other: LYMPHEDEMA precautions  (DM 2 and asthma)  WEIGHT BEARING RESTRICTIONS: No  FALLS:  Has patient fallen in last 6 months? TBA  LIVING ENVIRONMENT: Lives with: TBA Lives in: House/apartment Stairs: No;  Has following equipment at home: Environmental consultant - 4 wheeled  OCCUPATION: retired Scientist, research (life sciences)  LEISURE: reading and taking notes on current events and politics  HAND DOMINANCE: right   PRIOR LEVEL OF FUNCTION: Independent with household mobility with device, Requires assistive device for independence, Needs assistance with ADLs, Needs assistance with homemaking, and Needs assistance with gait  PATIENT GOALS: reduce swelling and keep it from getting worse  OBJECTIVE: Note: Objective measures were completed at Evaluation unless otherwise noted.  COGNITION:  Overall cognitive status: impaired short term  memory   OBSERVATIONS / OTHER ASSESSMENTS: Mild, Stage II, BLE Lymphedema 2/2 suspected venous insufficiency.   SENSATION: Reports uncomfortable scratching, or sticker-like  sensation on anterior thighs   POSTURE: Raised walker handles 2 notches to increase upright spinal  alignment when walking to limit falls risk. Handles need to be raised at least 1 hole more for safe upright posture when walking.  Upright sitting posture: head forward , shoulders forward and rounded- appears to be flexible kyphosis  LE ROM: WFL for ankles and knees. Pt reports limited hip abduction )  LE MMT: WFL FOR LYMPHEDEMA CARE. Presenting with generalized weakness and Pt reports debility  LYMPHEDEMA ASSESSMENTS:   SURGERY TYPE/DATE: Non-cancer related limb swelling  HX INFECTIONS: positive for 1 episode cellulitis treated w antibiotics  Hx WOUNDS: denies   BLE COMPARATIVE LIMB VOLUMETRICS: INITIAL 07/19/23  LANDMARK RIGHT (dominant)  R LEG (A-D) 3525.1 ml  R THIGH (E-G) ml  R FULL LIMB (A-G) ml  Limb Volume differential (LVD)  %  Volume change since initial %  Volume change overall V  (Blank rows = not tested)  LANDMARK LEFT    L LEG (A-D) 3769.6 ml  L THIGH (E-G) ml  L FULL LIMB (A-G) ml  Limb Volume differential (LVD)  Limb Volume Differential (LVD) measures 6.5%, L>R.  Volume change since initial %  Volume change overall %    RLE COMPARATIVE LIMB VOLUMETRICS:  VISIT 9 08/15/23  LANDMARK RIGHT (dominant)  R LEG (A-D) 3549.4 ml  R THIGH (E-G) ml  R FULL LIMB (A-G) ml  Limb Volume differential (LVD)  %  Volume change since initial R LEG volume reduced by 5.84% since initially measured on 07/18/21  Volume change overall V  (Blank rows = not tested)  Mild, Stage  II, Bilateral Lower Extremity Lymphedema 2/2 CVI and Obesity  Skin  Description Hyper-Keratosis Peau' de Orange Shiny Tight Fibrotic/ Indurated Fatty Doughy Spongy/ boggy       R>L x  x   Skin dry Flaky WNL Macerated   mildly      Color Redness Varicosities Blanching Hemosiderin Stain Mottled   x     x   Odor Malodorous Yeast Fungal infection  WNL      x   Temperature Warm Cool wnl    x     Pitting Edema   1+ 2+ 3+ 4+ Non-pitting         x   Girth Symmetrical Asymmetrical                    Distribution    R>L toes to groin    Stemmer Sign Positive Negative   +    Lymphorrhea History Of:  Present Absent     x    Wounds History Of Present Absent Venous Arterial Pressure Sheer     x        Signs of Infection Redness Warmth Erythema Acute Swelling Drainage Borders                    Sensation Light Touch Deep pressure Hypersensitivity   Present Impaired Present Impaired Absent Impaired   x Tactile  x  x     Nails WNL   Fungus nail dystrophy   x     Hair Growth Symmetrical Asymmetrical   x    Skin Creases Base of toes  Ankles   Base of Fingers knees       Abdominal pannus Thigh Lobules  Face/neck   x x  x      (Blank rows = not tested)    GAIT: Distance walked: >500' Assistive device utilized: Environmental consultant - 4 wheeled Level of assistance: Modified independence Comments: Pt bent at waist nearly 90 degrees with walker handles in lowest position  LYMPHEDEMA LIFE IMPACT SCALE (LLIS): TBA OT Rx visit 1  TREATMENT DATE:  RLE/RLQ MLD with simultaneous skin care as established RLE multilayer compression bandaging Pt edu for lymphedema self-care throughout session    PATIENT EDUCATION:  Continued Pt/ CG edu for lymphedema self care home program throughout session. Topics include outcome of comparative limb volumetrics- starting limb volume differentials (LVDs), technology and gradient techniques used for short stretch, multilayer compression wrapping, simple self-MLD, therapeutic lymphatic pumping exercises, skin/nail care, LE precautions, compression garment recommendations and specifications, wear and care schedule and compression garment donning / doffing w assistive devices. Discussed progress towards all OT goals since commencing CDT. Discussed detrimental impact of obesity on lower and upper extremity lymphedema over time. Reviewed OT goals for lymphedema care with Pt and discussed progress to date.  All questions answered to the Pt's satisfaction.  Good return. Person educated: Patient  Education method: Explanation, Demonstration, and Handouts Education comprehension: verbalized understanding, returned demonstration, verbal cues required, and needs further education   HOME EXERCISE PROGRAM: BLE lymphatic pumping there ex using- 1 sets of 10 reps, each exercise in order-  1-2 x daily, bilaterally Simple self MLD 1 x daily Daily skin care to increase hydration, skin mobility and decrease infection risk- can be done during MLD During Intensive Phase CDT: Compression wraps 23/7 until limb volume reduction complete During Self-management Phase CDT: Fit with appropriate compression garments or alternatives. Consider BLE, knee length, Mediven, custom, CircAid , Velcro style leggings over soft cotton liners.  ASSESSMENT: CLINICAL IMPRESSION: Pt continues to demonstrate ongoing progress towards all goals. Continued MLD to RLE/RLQ utilizing functional inguinal LN, deep abdominal pathways and modified short neck sequence to avoid thyroid region (No strokes to lateral neck and very gentle strokes to R and L of sternal notch over terminus. Pt tolerated manual therapy without increased pain. Provided skin care to limit infection risk using castor oil to increase skin excursion during MLD. Pt educated throughout session on lymphedema self care. Reapplied multilayer, knee length compression wraps, adding newly fabricated, custom , Comprex foam kidneys be hind   each malleolus.. Reviewed bandaging reviewing simultaneously with wrapping. Cont as per POC.  (INITIAL EVAL 07/12/23: Lyncoln Graylin Pizza is a 73 yo female presenting with chronic, progressive, BLE swelling 2/2 unknown etiology, which at first glance appears to be circulatory in nature due to skin coloring at distal legs where swelling is most invested. Pt is presently unable to wear traditional elastic compression garments due to difficulty donning and doffing them.  Chronic, progressive  lymphedema with associated skin changes, including fibrosis, limits this patent's functional performance in all occupational domains, including functional ambulation and mobility, basic and instrumental ADLs (lower body dressing, LB bathing, fitting street shoes and LB clothing, driving, shopping, and home management. It also limits perform productive activities, leisure pursuits, and participation in social and community activities. BLE lymphedema contributes to elevated infection risk. Pt will benefit from skilled OT for Complete Decongestive Therapy (CDT), which  typically includes manual lymphatic drainage (MLD), skin care to limit' infection risk and increase skin excursion, lymphatic pumping exercise, and during the Intensive Phase multilayer, gradient compression bandaging to reduce limb volume. Once limb volume is reduced as much as possible, Pt is fitted with appropriate compression garments and/ or devices and transitions into the self management phase of care consisting of follow long support PRN.    Pt understands that her fair prognosis will become poor without daily assistance with compression wrapping since she is unable to reach feet and legs to apply them herself. Pt assures OT that she has a friend she believes is willing to assist her between OT sessions. )   OBJECTIVE IMPAIRMENTS: decreased standing and walking tolerance, activity tolerance, decreased balance, decreased knowledge of condition, decreased knowledge of use of DME, decreased mobility, decreased ROM, decreased strength, increased edema, impaired flexibility, impaired sensation, impaired UE functional use, impaired vision/reception, pain, and chronic, progressive, BLE swelling and associated skin changes, at increased infection risk   ACTIVITY LIMITATIONS: Functional ambulation and mobility (lifting, carrying, steps/stairs, transfers, squatting) , Basic and instrumental ADLs, leisure pursuits, productive activities, social  participation  PARTICIPATION LIMITATIONS: meal prep, cleaning, laundry, driving, shopping, yard work, and    PERSONAL FACTORS: Age, Fitness, Past/current experiences, Time since onset of injury/illness/exacerbation, 2+ co morbidities: OSA, HTN,  are also affecting patient's functional outcome.   REHAB POTENTIAL: Good  EVALUATION COMPLEXITY: Moderate   GOALS: Goals reviewed with patient? Yes  SHORT TERM GOALS: Target date: 4th OT Rx visit   Pt will demonstrate understanding of lymphedema precautions and prevention strategies with modified independence using a printed reference to identify at  least 5 precautions and discussing how s/he may implement them into daily life to reduce risk of progression with extra time. Baseline:Max A Goal status: GOAL MET  2.  Pt will be able to apply multilayer, knee length, gradient, compression wraps to one leg at a time from toes to below knee with max caregiver assist to decrease limb volume, to limit infection risk, and to limit lymphedema progression.  Baseline: Dependent Goal status: GOAL MET  LONG TERM GOALS: Target date: 09/19/23  1.Given this patient's Intake score of TBA % on the Lymphedema Life Impact Scale (LLIS), patient will experience a reduction of at least 5 points in her perceived level of functional impairment resulting from lymphedema to improve functional performance and quality of life (QOL). Baseline: TBA % Goal status:  GOAL DEFERRED  2.  Pt will achieve at least a 10% volume reduction in B legs to return limb to typical size and shape, to limit infection risk and LE progression, to decrease pain, to improve function. Baseline: Dependent Goal status: PROGRESSING. R LEG volume reduced by 5.84% since initially measured on 07/18/21  3.  Pt will obtain appropriate compression garments/devices and achieve modified independence (extra time + assistive devices) with donning/doffing to optimize limb volume reductions and limit LE  progression over time. Baseline: Dependent Goal status: PROGRESSING  During Intensive phase CDT, with max CG assistance, Pt will achieve at least 85% compliance with all adapted lymphedema self-care home program components, including daily skin care, compression wraps and /or garments, simple self MLD and lymphatic pumping therex to habituate LE self care protocol  into ADLs for optimal LE self-management over time. Baseline: Dependent Goal status:PROGRESSING  PLAN:  OT FREQUENCY: 2 x/week  OT DURATION: 12 weeks  PLANNED INTERVENTIONS:compression bandaging, skin care,  97110-Therapeutic exercises, 97530- Therapeutic activity, 97535- Self Care, Manual lymph drainage, DME instructions, and fit with appropriate compression  PLAN FOR NEXT SESSION:  RLE MLD and skin care RLE multilayer compression bandaging Cont Pt edu for LE self-care   Zebedee Dec, MS, OTR/L, CLT-LANA 09/14/23 4:13 PM

## 2023-09-19 ENCOUNTER — Ambulatory Visit

## 2023-09-19 ENCOUNTER — Encounter: Payer: Self-pay | Admitting: Occupational Therapy

## 2023-09-19 ENCOUNTER — Ambulatory Visit: Admitting: Occupational Therapy

## 2023-09-19 DIAGNOSIS — M5459 Other low back pain: Secondary | ICD-10-CM | POA: Diagnosis not present

## 2023-09-19 DIAGNOSIS — R278 Other lack of coordination: Secondary | ICD-10-CM

## 2023-09-19 DIAGNOSIS — M25551 Pain in right hip: Secondary | ICD-10-CM | POA: Diagnosis not present

## 2023-09-19 DIAGNOSIS — R262 Difficulty in walking, not elsewhere classified: Secondary | ICD-10-CM

## 2023-09-19 DIAGNOSIS — M6281 Muscle weakness (generalized): Secondary | ICD-10-CM | POA: Diagnosis not present

## 2023-09-19 DIAGNOSIS — I890 Lymphedema, not elsewhere classified: Secondary | ICD-10-CM | POA: Diagnosis not present

## 2023-09-19 NOTE — Therapy (Signed)
 OUTPATIENT PHYSICAL THERAPY THORACOLUMBAR EVALUATION   Patient Name: Judith Arnold MRN: [id number] DOB:10/18/1950, 73 y.o., female Today's Date: 09/20/2023  END OF SESSION:  PT End of Session - 09/20/23 1023     Visit Number 1    Number of Visits 25    Date for PT Re-Evaluation 12/12/23    PT Start Time 1617    PT Stop Time 1702    PT Time Calculation (min) 45 min    Equipment Utilized During Treatment Gait belt    Activity Tolerance Patient tolerated treatment well;Patient limited by pain    Behavior During Therapy WFL for tasks assessed/performed          Past Medical History:  Diagnosis Date   Allergy    Arthritis    lower back, left shoulder   Asthma    Carpal tunnel syndrome on both sides    Dental bridge present    top   Dental crown present    multiple   Family history of adverse reaction to anesthesia    Father and sister - PONV   GERD (gastroesophageal reflux disease)    Glaucoma    Hypertension    Leaky heart valve    Pre-diabetes    Sleep apnea    Past Surgical History:  Procedure Laterality Date   CATARACT EXTRACTION W/PHACO Right 11/16/2016   Procedure: CATARACT EXTRACTION PHACO AND INTRAOCULAR LENS PLACEMENT (IOC)  right;  Surgeon: Mittie Gaskin, MD;  Location: [facility];  Service: Ophthalmology;  Laterality: Right;  pre diabetic latex sensitivity sleep apnea   CATARACT EXTRACTION W/PHACO Left 01/18/2017   Procedure: CATARACT EXTRACTION PHACO AND INTRAOCULAR LENS PLACEMENT (IOC) LEFT DIABETIC;  Surgeon: Mittie Gaskin, MD;  Location: [facility];  Service: Ophthalmology;  Laterality: Left;   COLONOSCOPY     COLONOSCOPY WITH PROPOFOL  N/A 04/09/2018   Procedure: COLONOSCOPY WITH PROPOFOL ;  Surgeon: Viktoria Lamar DASEN, MD;  Location: [facility];  Service: Endoscopy;  Laterality: N/A;   IRIDOTOMY / IRIDECTOMY Bilateral 2005   TOOTH EXTRACTION     Patient Active Problem List   Diagnosis Date Noted   Hypertension  associated with diabetes (HCC) 01/20/2023   Combined hyperlipidemia associated with type 2 diabetes mellitus (HCC) 01/20/2023   Intertrigo 11/01/2022   Stasis dermatitis of both legs 10/21/2022   Leg swelling 10/21/2022   Lymphedema of lower extremity 10/21/2022   Type 2 diabetes mellitus without complication, without long-term current use of insulin (HCC) 05/24/2022   Mixed hyperlipidemia 05/24/2022   Essential hypertension, benign 05/24/2022   Absolute anemia 05/24/2022   Gastroesophageal reflux disease without esophagitis 05/24/2022   Allergic rhinitis 05/24/2022   Asthma, allergic, moderate persistent, uncomplicated 05/24/2022   Bilateral chronic angle-closure glaucoma, indeterminate stage 05/24/2022   Airway hyperreactivity 08/29/2013   OSA on CPAP 08/29/2013    PCP: Fernand Fredy RAMAN, MD  REFERRING PROVIDER: Fernand Fredy, RAMAN, MD  REFERRING DIAG:  Diagnosis  G89.29,M54.41 (ICD-10-CM) - Chronic bilateral low back pain with right-sided sciatica    Rationale for Evaluation and Treatment: Rehabilitation  THERAPY DIAG:  Other low back pain  Pain in right hip  Muscle weakness (generalized)  Difficulty in walking, not elsewhere classified  Other lack of coordination  ONSET DATE: >3 years   SUBJECTIVE:  SUBJECTIVE STATEMENT: Pt is a pleasant 73 y/o female presenting to PT eval for chronic LBP with R side hip pain. She reports onset of LBP over 3 years ago. Primary site of pain is mostly felt in R hip and lower spine. Pt reports hx of arthritis in lower back and R hip. She is now starting to feel arthritis pain in her knees as well. Reports RLE is actually her good leg, has some issues with pain into groin region of LLE. Pt states strength in legs has been impacted and that her general mobility  is worse. Pt can only ambulate comfortably by taking weight off of lower back by using her 4WW. She states not using 4WW so much for balance, but for reducing pain with gait. She is unable to stand up fully due to pain. Pt says she used to be very fit (took take karate, gymnastics years ago). She is also seeing OT for BLE lymphedema management. She endorses difficulty getting out of chairs, difficulty with steps, she must use a ramp to get in and out of her home. Pt reports my brain does not retain what I'm talking about, can lose train of thought.  PERTINENT HISTORY:  PMH per chart includes arthritis low back and left shoulder, bilateral carpal tunnel, asthma, glaucoma, HTN, leaky heart valve, sleep apnea, pre-diabetes, being seen by lymphedema specialist for BLE lymphedema   PAIN:  Are you having pain? LBP and R hip pain  Lowest pain level: 0/10  With standing: 2/10  Worst: 5-6/10   PRECAUTIONS: LATEX ALLERGY per chart, fall  RED FLAGS: None   WEIGHT BEARING RESTRICTIONS: No  FALLS:  Has patient fallen in last 6 months? No  LIVING ENVIRONMENT: Uses ramp to get into home due to difficulty with steps, has 4WW  PLOF: Independent  PATIENT GOALS:  Says due to back pain she is unable to reach my feet, has difficulty getting her socks on without a grabber, wants to be more flexible and stronger to increase ease with these activities and mobility    OBJECTIVE:  Note: Objective measures were completed at Evaluation unless otherwise noted.  DIAGNOSTIC FINDINGS:  No recent pertinent imaging available in chart  PATIENT SURVEYS:  Modified Oswestry score: 34%  Interpretation of scores: Score Category Description  0-20% Minimal Disability The patient can cope with most living activities. Usually no treatment is indicated apart from advice on lifting, sitting and exercise  21-40% Moderate Disability The patient experiences more pain and difficulty with sitting, lifting and standing.  Travel and social life are more difficult and they may be disabled from work. Personal care, sexual activity and sleeping are not grossly affected, and the patient can usually be managed by conservative means  41-60% Severe Disability Pain remains the main problem in this group, but activities of daily living are affected. These patients require a detailed investigation  61-80% Crippled Back pain impinges on all aspects of the patient's life. Positive intervention is required  81-100% Bed-bound  These patients are either bed-bound or exaggerating their symptoms  Bluford FORBES Zoe DELENA Karon DELENA, et al. Surgery versus conservative management of stable thoracolumbar fracture: the PRESTO feasibility RCT. [place] ([place]): [employer]; 2021 Nov. [facility] Technology Assessment, No. 25.62.) Appendix 3, Oswestry Disability Index category descriptors. Available from: [employer]  Minimally Clinically Important Difference (MCID) = 12.8%  COGNITION: Overall cognitive status: Within functional limits for tasks assessed, very pleasant pt can become tangential and requires redirection to activity    SENSATION: Intact  to light touch BLE   MUSCLE LENGTH: deferred  POSTURE: increased thoracic kyphosis, rounded shoulders, unable to achieve full upright position due to pain, maintains increased hip and knee flexion when attempting to stand fully upright   PALPATION: deferred  Thoracolumbar AROM: Extension - unable to achieve neutral position, remains in significant flexion  Flexion - lacking at least 25%  Rotation - lacking at least 40% bilat  and pain-limited    LOWER EXTREMITY MMT:    MMT Right eval Left eval  Hip flexion 4- 3*  Hip extension    Hip abduction 4+ 4+  Hip adduction 4+ 4+  Hip internal rotation    Hip external rotation    Knee flexion 4 3*  Knee extension 4+ 4+  Ankle dorsiflexion 4 4+  Ankle plantarflexion    Ankle inversion    Ankle  eversion     (Blank rows = not tested) *=pain limited   LUMBAR SPECIAL TESTS:  deferred  FUNCTIONAL TESTS:  10 meter walk test: 0.63 m/s crouched posture, decreased hip extension, decreased step-length and heel strike, elevated R>L shoulder, heavy BUE weightbearing on 4WW  GAIT: Distance walked: 10MWT/clinic distances (see above) Assistive device utilized: Environmental consultant - 4 wheeled Level of assistance: Modified independence Comments: gait mechanics impaired: decreased gait speed, crouched posture, decreased hip extension, decreased step-length and heel strike, elevated R>L shoulder, heavy BUE weightbearing on 4WW  TREATMENT DATE: 09/19/2023  Self-Care:  Provided education on findings of exam, indications for plan, prognoses, goals, how PT can help                                                                                                                                 PATIENT EDUCATION:  Education details: Provided education on findings of exam, indications for plan, prognoses, goals, how PT can help                 Person educated: Patient Education method: Explanation Education comprehension: verbalized understanding  HOME EXERCISE PROGRAM: To be initiated next 1-2 visits   ASSESSMENT:  CLINICAL IMPRESSION: Patient is a pleasant 73 y.o. female who was seen today for physical therapy evaluation and treatment for chronic LBP with R side sciatica. Exam findings indicate impairments of strength, mobility, pain, and gait. Formal balance assessment not completed today, however, pt did require some support & close CGA to complete some aspects of testing and pt also reported to PT difficulty with steps and exhibited significant gait impairments indicative of likely balance deficit. These findings warrant further balance testing. The pt will benefit from further skilled PT to address strength, gait, mobility, balance and pain to increase ease and safety with ADLS and QOL.  OBJECTIVE  IMPAIRMENTS: Abnormal gait, decreased activity tolerance, decreased balance, decreased mobility, difficulty walking, decreased strength, hypomobility, increased edema, improper body mechanics, postural dysfunction, and pain.   ACTIVITY LIMITATIONS: carrying, lifting, bending, squatting, stairs, transfers, bed mobility, and locomotion level  PARTICIPATION  LIMITATIONS: meal prep, cleaning, shopping, community activity, and yard work  PERSONAL FACTORS: Age, Fitness, Sex, Time since onset of injury/illness/exacerbation, and 3+ comorbidities: PMH per chart includes arthritis low back and left shoulder, bilateral carpal tunnel, asthma, glaucoma, HTN, leaky heart valve, sleep apnea, pre-diabetes, being seen by lymphedema specialist for BLE lymphedema  are also affecting patient's functional outcome.   REHAB POTENTIAL: Good  CLINICAL DECISION MAKING: Evolving/moderate complexity  EVALUATION COMPLEXITY: Moderate   GOALS: Goals reviewed with patient? Yes   SHORT TERM GOALS: Target date: 10/31/2023    Patient will be independent in home exercise program to improve strength/mobility for better functional independence with ADLs. Baseline:to be initiated  Goal status: INITIAL   LONG TERM GOALS: Target date: 12/12/2023    Patient will reduce modified Oswestry score to <20 as to demonstrate minimal disability with ADLs including improved sleeping tolerance, walking/sitting tolerance etc for better mobility with ADLs.  Baseline: 32 Goal status: INITIAL  2.  Patient (> 34 years old) will complete five times sit to stand test in < 15 seconds indicating an increased LE strength and improved balance. Baseline:  Goal status: INITIAL  3.  Patient will increase Berg Balance score by > 6 points to demonstrate decreased fall risk during functional activities Baseline:  Goal status: INITIAL  4.  Patient will increase 10 meter walk test to >1.9m/s as to improve gait speed for better community ambulation  and to reduce fall risk. Baseline:  0.63 m/s with 4WW Goal status: INITIAL      PLAN:  PT FREQUENCY: 1-2x/week  PT DURATION: 12 weeks  PLANNED INTERVENTIONS: 97164- PT Re-evaluation, 97750- Physical Performance Testing, 97110-Therapeutic exercises, 97530- Therapeutic activity, [id number]- Neuromuscular re-education, 97535- Self Care, [id number]- Manual therapy, [id number]- Gait training, [phone]- Orthotic Initial, [phone]- Orthotic/Prosthetic subsequent, [phone]- Canalith repositioning, Patient/Family education, Balance training, Stair training, Taping, Joint mobilization, Spinal mobilization, Vestibular training, DME instructions, Cryotherapy, and Moist heat.  PLAN FOR NEXT SESSION: 5xSTS, BERG, special tests and/or muscle length testing, initiate HEP Darryle Patten PT, DPT   Darryle JONELLE Patten, PT 09/20/2023, 10:53 AM

## 2023-09-19 NOTE — Therapy (Signed)
 OUTPATIENT OCCUPATIONAL THERAPY TREATMENT NOTE  BILATERAL LOWER EXTREMITY LYMPHEDEMA  Patient Name: MOZEL BURDETT MRN: [id number] DOB:[birth year], 73 y.o., female Today's Date: 09/19/2023  REPORTING PERIOD:    END OF SESSION:   OT End of Session - 09/19/23 1523     Visit Number 15    Number of Visits 36    Date for OT Re-Evaluation 10/10/23    OT Start Time 0313    Activity Tolerance Patient tolerated treatment well;No increased pain    Behavior During Therapy WFL for tasks assessed/performed           Past Medical History:  Diagnosis Date   Allergy    Arthritis    lower back, left shoulder   Asthma    Carpal tunnel syndrome on both sides    Dental bridge present    top   Dental crown present    multiple   Family history of adverse reaction to anesthesia    Father and sister - PONV   GERD (gastroesophageal reflux disease)    Glaucoma    Hypertension    Leaky heart valve    Pre-diabetes    Sleep apnea    Past Surgical History:  Procedure Laterality Date   CATARACT EXTRACTION W/PHACO Right 11/16/2016   Procedure: CATARACT EXTRACTION PHACO AND INTRAOCULAR LENS PLACEMENT (IOC)  right;  Surgeon: Mittie Gaskin, MD;  Location: [facility];  Service: Ophthalmology;  Laterality: Right;  pre diabetic latex sensitivity sleep apnea   CATARACT EXTRACTION W/PHACO Left 01/18/2017   Procedure: CATARACT EXTRACTION PHACO AND INTRAOCULAR LENS PLACEMENT (IOC) LEFT DIABETIC;  Surgeon: Mittie Gaskin, MD;  Location: [facility];  Service: Ophthalmology;  Laterality: Left;   COLONOSCOPY     COLONOSCOPY WITH PROPOFOL  N/A 04/09/2018   Procedure: COLONOSCOPY WITH PROPOFOL ;  Surgeon: Viktoria Lamar DASEN, MD;  Location: [facility];  Service: Endoscopy;  Laterality: N/A;   IRIDOTOMY / IRIDECTOMY Bilateral 2005   TOOTH EXTRACTION     Patient Active Problem List   Diagnosis Date Noted   Hypertension associated with diabetes (HCC) 01/20/2023   Combined  hyperlipidemia associated with type 2 diabetes mellitus (HCC) 01/20/2023   Intertrigo 11/01/2022   Stasis dermatitis of both legs 10/21/2022   Leg swelling 10/21/2022   Lymphedema of lower extremity 10/21/2022   Type 2 diabetes mellitus without complication, without long-term current use of insulin (HCC) 05/24/2022   Mixed hyperlipidemia 05/24/2022   Essential hypertension, benign 05/24/2022   Absolute anemia 05/24/2022   Gastroesophageal reflux disease without esophagitis 05/24/2022   Allergic rhinitis 05/24/2022   Asthma, allergic, moderate persistent, uncomplicated 05/24/2022   Bilateral chronic angle-closure glaucoma, indeterminate stage 05/24/2022   Airway hyperreactivity 08/29/2013   OSA on CPAP 08/29/2013    PCP: Fredy GORMAN Bathe, MD  REFERRING PROVIDER: same  REFERRING DIAG: lymphedema I89.0  THERAPY DIAG:  Lymphedema, not elsewhere classified  Rationale for Evaluation and Treatment: Rehabilitation  ONSET DATE: Pt reports onset of L ankle / leg swelling after injury 20-30 yrs ago. Then about 1 year ago legs started swelling and Pt having prickly sensation on shins and thighs bilaterally.   SUBJECTIVE:  SUBJECTIVE STATEMENT: Jessicca Stitzer presents to OT for treatment of BLE lymphedema 2/2 suspected CVI and obesity. Pt is unaccompanied today. Pt is late for session, so OT visit abbreviated. Pt reports pain in medial proximal thighs 5/10.  Pt reports her friend wrapped her well yesterday.  (INITIAL EVAL 07/12/23 : Nyelah Emmerich is referred to Occupational Therapy by Fredy GORMAN Bathe, MD,  for evaluation and treatment of BLE lymphedema.  Pt reports onset of L ankle and distal leg swelling about 30 years ago, then noticed RLE swelling and worsening LLE swelling about 1 year ago at same time  she started having unusual sensation in legs. Pt denies previously having lymphedema treatment. Pt reports paternal aunt had leg swelling. Pt is unable to wear compression stockings because she is unable to reach feet and distal legs to don and doff them due to back and hip pain.)  PERTINENT HISTORY: Relevant to lymphedema (OSA (denies using CPAP), BLE stasis dermatitis, HTN, DM 2 (Pt states she is prediabetic only), B glaucoma, Back and R hip OA  PAIN:  Are you having pain? Yes: NPRS scale: 5/10 Pain location: medial proximal thighs w abduction Pain description: like stickers Aggravating factors: standing, walking, dependent sitting Relieving factors: elevation  PRECAUTIONS: Fall and Other: LYMPHEDEMA precautions  (DM 2 and asthma)  WEIGHT BEARING RESTRICTIONS: No  FALLS:  Has patient fallen in last 6 months? TBA  LIVING ENVIRONMENT: Lives with: TBA Lives in: House/apartment Stairs: No;  Has following equipment at home: Environmental consultant - 4 wheeled  OCCUPATION: retired Scientist, research (life sciences)  LEISURE: reading and taking notes on current events and politics  HAND DOMINANCE: right   PRIOR LEVEL OF FUNCTION: Independent with household mobility with device, Requires assistive device for independence, Needs assistance with ADLs, Needs assistance with homemaking, and Needs assistance with gait  PATIENT GOALS: reduce swelling and keep it from getting worse  OBJECTIVE: Note: Objective measures were completed at Evaluation unless otherwise noted.  COGNITION:  Overall cognitive status: impaired short term  memory   OBSERVATIONS / OTHER ASSESSMENTS: Mild, Stage II, BLE Lymphedema 2/2 suspected venous insufficiency.   SENSATION: Reports uncomfortable scratching, or sticker-like  sensation on anterior thighs   POSTURE: Raised walker handles 2 notches to increase upright spinal alignment when walking to limit falls risk. Handles need to be raised at least 1 hole more for safe upright posture  when walking.  Upright sitting posture: head forward , shoulders forward and rounded- appears to be flexible kyphosis  LE ROM: WFL for ankles and knees. Pt reports limited hip abduction )  LE MMT: WFL FOR LYMPHEDEMA CARE. Presenting with generalized weakness and Pt reports debility  LYMPHEDEMA ASSESSMENTS:   SURGERY TYPE/DATE: Non-cancer related limb swelling  HX INFECTIONS: positive for 1 episode cellulitis treated w antibiotics  Hx WOUNDS: denies   BLE COMPARATIVE LIMB VOLUMETRICS: INITIAL 07/19/23  LANDMARK RIGHT (dominant)  R LEG (A-D) 3525.1 ml  R THIGH (E-G) ml  R FULL LIMB (A-G) ml  Limb Volume differential (LVD)  %  Volume change since initial %  Volume change overall V  (Blank rows = not tested)  LANDMARK LEFT    L LEG (A-D) 3769.6 ml  L THIGH (E-G) ml  L FULL LIMB (A-G) ml  Limb Volume differential (LVD)  Limb Volume Differential (LVD) measures 6.5%, L>R.  Volume change since initial %  Volume change overall %    RLE COMPARATIVE LIMB VOLUMETRICS: VISIT 9 08/15/23  LANDMARK RIGHT (dominant)  R LEG (A-D) 3549.4 ml  R THIGH (E-G) ml  R FULL LIMB (A-G) ml  Limb Volume differential (LVD)  %  Volume change since initial R LEG volume reduced by 5.84% since initially measured on 07/18/21  Volume change overall V  (Blank rows = not tested)  Mild, Stage  II, Bilateral Lower Extremity Lymphedema 2/2 CVI and Obesity  Skin  Description Hyper-Keratosis Peau' de Orange Shiny Tight Fibrotic/ Indurated Fatty Doughy Spongy/ boggy       R>L x  x   Skin dry Flaky WNL Macerated   mildly      Color Redness Varicosities Blanching Hemosiderin Stain Mottled   x     x   Odor Malodorous Yeast Fungal infection  WNL      x   Temperature Warm Cool wnl    x     Pitting Edema   1+ 2+ 3+ 4+ Non-pitting         x   Girth Symmetrical Asymmetrical                   Distribution    R>L toes to groin    Stemmer Sign Positive Negative   +    Lymphorrhea History Of:   Present Absent     x    Wounds History Of Present Absent Venous Arterial Pressure Sheer     x        Signs of Infection Redness Warmth Erythema Acute Swelling Drainage Borders                    Sensation Light Touch Deep pressure Hypersensitivity   Present Impaired Present Impaired Absent Impaired   x Tactile  x  x     Nails WNL   Fungus nail dystrophy   x     Hair Growth Symmetrical Asymmetrical   x    Skin Creases Base of toes  Ankles   Base of Fingers knees       Abdominal pannus Thigh Lobules  Face/neck   x x  x      (Blank rows = not tested)    GAIT: Distance walked: >500' Assistive device utilized: Environmental consultant - 4 wheeled Level of assistance: Modified independence Comments: Pt bent at waist nearly 90 degrees with walker handles in lowest position  LYMPHEDEMA LIFE IMPACT SCALE (LLIS): TBA OT Rx visit 1  TREATMENT DATE:  RLE/RLQ MLD with simultaneous skin care as established RLE multilayer compression bandaging Pt edu for lymphedema self-care throughout session    PATIENT EDUCATION:  Continued Pt/ CG edu for lymphedema self care home program throughout session. Topics include outcome of comparative limb volumetrics- starting limb volume differentials (LVDs), technology and gradient techniques used for short stretch, multilayer compression wrapping, simple self-MLD, therapeutic lymphatic pumping exercises, skin/nail care, LE precautions, compression garment recommendations and specifications, wear and care schedule and compression garment donning / doffing w assistive devices. Discussed progress towards all OT goals since commencing CDT. Discussed detrimental impact of obesity on lower and upper extremity lymphedema over time. Reviewed OT goals for lymphedema care with Pt and discussed progress to date.  All questions answered to the Pt's satisfaction. Good return. Person educated: Patient  Education method: Explanation, Demonstration, and Handouts Education  comprehension: verbalized understanding, returned demonstration, verbal cues required, and needs further education   HOME EXERCISE PROGRAM: BLE lymphatic pumping there ex using- 1 sets of 10 reps, each exercise in order-  1-2 x daily, bilaterally Simple self MLD 1 x daily Daily skin care to  increase hydration, skin mobility and decrease infection risk- can be done during MLD During Intensive Phase CDT: Compression wraps 23/7 until limb volume reduction complete During Self-management Phase CDT: Fit with appropriate compression garments or alternatives. Consider BLE, knee length, Mediven, custom, CircAid , Velcro style leggings over soft cotton liners.  ASSESSMENT: CLINICAL IMPRESSION: Pt continues to demonstrate ongoing progress towards all goals. Continued MLD to RLE/RLQ utilizing functional inguinal LN, deep abdominal pathways and modified short neck sequence to avoid thyroid region (No strokes to lateral neck and very gentle strokes to R and L of sternal notch over terminus. Pt tolerated manual therapy without increased pain. Provided skin care to limit infection risk using castor oil to increase skin excursion during MLD. Pt educated throughout session on lymphedema self care. Reapplied multilayer, knee length compression wraps, adding newly fabricated, custom , Comprex foam kidneys be hind   each malleolus.. Reviewed bandaging reviewing simultaneously with wrapping. Cont as per POC.  (INITIAL EVAL 07/12/23: Bana Graylin Pizza is a 73 yo female presenting with chronic, progressive, BLE swelling 2/2 unknown etiology, which at first glance appears to be circulatory in nature due to skin coloring at distal legs where swelling is most invested. Pt is presently unable to wear traditional elastic compression garments due to difficulty donning and doffing them.  Chronic, progressive lymphedema with associated skin changes, including fibrosis, limits this patent's functional performance in all  occupational domains, including functional ambulation and mobility, basic and instrumental ADLs (lower body dressing, LB bathing, fitting street shoes and LB clothing, driving, shopping, and home management. It also limits perform productive activities, leisure pursuits, and participation in social and community activities. BLE lymphedema contributes to elevated infection risk. Pt will benefit from skilled OT for Complete Decongestive Therapy (CDT), which  typically includes manual lymphatic drainage (MLD), skin care to limit' infection risk and increase skin excursion, lymphatic pumping exercise, and during the Intensive Phase multilayer, gradient compression bandaging to reduce limb volume. Once limb volume is reduced as much as possible, Pt is fitted with appropriate compression garments and/ or devices and transitions into the self management phase of care consisting of follow long support PRN.    Pt understands that her fair prognosis will become poor without daily assistance with compression wrapping since she is unable to reach feet and legs to apply them herself. Pt assures OT that she has a friend she believes is willing to assist her between OT sessions. )   OBJECTIVE IMPAIRMENTS: decreased standing and walking tolerance, activity tolerance, decreased balance, decreased knowledge of condition, decreased knowledge of use of DME, decreased mobility, decreased ROM, decreased strength, increased edema, impaired flexibility, impaired sensation, impaired UE functional use, impaired vision/reception, pain, and chronic, progressive, BLE swelling and associated skin changes, at increased infection risk   ACTIVITY LIMITATIONS: Functional ambulation and mobility (lifting, carrying, steps/stairs, transfers, squatting) , Basic and instrumental ADLs, leisure pursuits, productive activities, social participation  PARTICIPATION LIMITATIONS: meal prep, cleaning, laundry, driving, shopping, yard work, and     PERSONAL FACTORS: Age, Fitness, Past/current experiences, Time since onset of injury/illness/exacerbation, 2+ co morbidities: OSA, HTN,  are also affecting patient's functional outcome.   REHAB POTENTIAL: Good  EVALUATION COMPLEXITY: Moderate   GOALS: Goals reviewed with patient? Yes  SHORT TERM GOALS: Target date: 4th OT Rx visit   Pt will demonstrate understanding of lymphedema precautions and prevention strategies with modified independence using a printed reference to identify at least 5 precautions and discussing how s/he may implement them into daily life to  reduce risk of progression with extra time. Baseline:Max A Goal status: GOAL MET  2.  Pt will be able to apply multilayer, knee length, gradient, compression wraps to one leg at a time from toes to below knee with max caregiver assist to decrease limb volume, to limit infection risk, and to limit lymphedema progression.  Baseline: Dependent Goal status: GOAL MET  LONG TERM GOALS: Target date: 09/19/23  1.Given this patient's Intake score of TBA % on the Lymphedema Life Impact Scale (LLIS), patient will experience a reduction of at least 5 points in her perceived level of functional impairment resulting from lymphedema to improve functional performance and quality of life (QOL). Baseline: TBA % Goal status:  GOAL DEFERRED  2.  Pt will achieve at least a 10% volume reduction in B legs to return limb to typical size and shape, to limit infection risk and LE progression, to decrease pain, to improve function. Baseline: Dependent Goal status: PROGRESSING. R LEG volume reduced by 5.84% since initially measured on 07/18/21  3.  Pt will obtain appropriate compression garments/devices and achieve modified independence (extra time + assistive devices) with donning/doffing to optimize limb volume reductions and limit LE progression over time. Baseline: Dependent Goal status: PROGRESSING  During Intensive phase CDT, with max CG  assistance, Pt will achieve at least 85% compliance with all adapted lymphedema self-care home program components, including daily skin care, compression wraps and /or garments, simple self MLD and lymphatic pumping therex to habituate LE self care protocol  into ADLs for optimal LE self-management over time. Baseline: Dependent Goal status:PROGRESSING  PLAN:  OT FREQUENCY: 2 x/week  OT DURATION: 12 weeks  PLANNED INTERVENTIONS:compression bandaging, skin care,  97110-Therapeutic exercises, 97530- Therapeutic activity, 97535- Self Care, Manual lymph drainage, DME instructions, and fit with appropriate compression  PLAN FOR NEXT SESSION:  Measure R leg to assess progress towards a stocking, or some alternative Cont Pt edu for LE self-care   Zebedee Dec, MS, OTR/L, CLT-LANA 09/19/23 3:27 PM

## 2023-09-21 ENCOUNTER — Ambulatory Visit: Admitting: Occupational Therapy

## 2023-09-21 DIAGNOSIS — R262 Difficulty in walking, not elsewhere classified: Secondary | ICD-10-CM | POA: Diagnosis not present

## 2023-09-21 DIAGNOSIS — M6281 Muscle weakness (generalized): Secondary | ICD-10-CM | POA: Diagnosis not present

## 2023-09-21 DIAGNOSIS — R278 Other lack of coordination: Secondary | ICD-10-CM | POA: Diagnosis not present

## 2023-09-21 DIAGNOSIS — M25551 Pain in right hip: Secondary | ICD-10-CM | POA: Diagnosis not present

## 2023-09-21 DIAGNOSIS — M5459 Other low back pain: Secondary | ICD-10-CM | POA: Diagnosis not present

## 2023-09-21 DIAGNOSIS — I890 Lymphedema, not elsewhere classified: Secondary | ICD-10-CM

## 2023-09-21 NOTE — Therapy (Signed)
 OUTPATIENT OCCUPATIONAL THERAPY TREATMENT NOTE  BILATERAL LOWER EXTREMITY LYMPHEDEMA  Patient Name: DEVITA NIES MRN: [id number] DOB:1950/03/24, 73 y.o., female Today's Date: 09/21/2023  REPORTING PERIOD:    END OF SESSION:   OT End of Session - 09/21/23 1512     Visit Number 16    Number of Visits 36    Date for OT Re-Evaluation 10/10/23    OT Start Time 0305    OT Stop Time 0355    OT Time Calculation (min) 50 min    Equipment Utilized During Treatment sock donner    Activity Tolerance Patient tolerated treatment well;No increased pain    Behavior During Therapy WFL for tasks assessed/performed           Past Medical History:  Diagnosis Date   Allergy    Arthritis    lower back, left shoulder   Asthma    Carpal tunnel syndrome on both sides    Dental bridge present    top   Dental crown present    multiple   Family history of adverse reaction to anesthesia    Father and sister - PONV   GERD (gastroesophageal reflux disease)    Glaucoma    Hypertension    Leaky heart valve    Pre-diabetes    Sleep apnea    Past Surgical History:  Procedure Laterality Date   CATARACT EXTRACTION W/PHACO Right 11/16/2016   Procedure: CATARACT EXTRACTION PHACO AND INTRAOCULAR LENS PLACEMENT (IOC)  right;  Surgeon: Mittie Gaskin, MD;  Location: [facility];  Service: Ophthalmology;  Laterality: Right;  pre diabetic latex sensitivity sleep apnea   CATARACT EXTRACTION W/PHACO Left 01/18/2017   Procedure: CATARACT EXTRACTION PHACO AND INTRAOCULAR LENS PLACEMENT (IOC) LEFT DIABETIC;  Surgeon: Mittie Gaskin, MD;  Location: [facility];  Service: Ophthalmology;  Laterality: Left;   COLONOSCOPY     COLONOSCOPY WITH PROPOFOL  N/A 04/09/2018   Procedure: COLONOSCOPY WITH PROPOFOL ;  Surgeon: Viktoria Lamar DASEN, MD;  Location: [facility];  Service: Endoscopy;  Laterality: N/A;   IRIDOTOMY / IRIDECTOMY Bilateral 2005   TOOTH EXTRACTION     Patient Active  Problem List   Diagnosis Date Noted   Hypertension associated with diabetes (HCC) 01/20/2023   Combined hyperlipidemia associated with type 2 diabetes mellitus (HCC) 01/20/2023   Intertrigo 11/01/2022   Stasis dermatitis of both legs 10/21/2022   Leg swelling 10/21/2022   Lymphedema of lower extremity 10/21/2022   Type 2 diabetes mellitus without complication, without long-term current use of insulin (HCC) 05/24/2022   Mixed hyperlipidemia 05/24/2022   Essential hypertension, benign 05/24/2022   Absolute anemia 05/24/2022   Gastroesophageal reflux disease without esophagitis 05/24/2022   Allergic rhinitis 05/24/2022   Asthma, allergic, moderate persistent, uncomplicated 05/24/2022   Bilateral chronic angle-closure glaucoma, indeterminate stage 05/24/2022   Airway hyperreactivity 08/29/2013   OSA on CPAP 08/29/2013    PCP: Fredy GORMAN Bathe, MD  REFERRING PROVIDER: same  REFERRING DIAG: lymphedema I89.0  THERAPY DIAG:  Lymphedema, not elsewhere classified  Rationale for Evaluation and Treatment: Rehabilitation  ONSET DATE: Pt reports onset of L ankle / leg swelling after injury 20-30 yrs ago. Then about 1 year ago legs started swelling and Pt having prickly sensation on shins and thighs bilaterally.   SUBJECTIVE:  SUBJECTIVE STATEMENT: Jillayne Witte presents to OT for treatment of BLE lymphedema 2/2 suspected CVI and obesity. Pt is unaccompanied today. Pt states, Im sore after the Pt session. Pt states she is also sore after unloading some water from her car. Pt states, I feel like my legs look better.   (INITIAL EVAL 07/12/23 : Celsey Asselin is referred to Occupational Therapy by Fredy GORMAN Bathe, MD,  for evaluation and treatment of BLE lymphedema.  Pt reports onset of L ankle and distal  leg swelling about 30 years ago, then noticed RLE swelling and worsening LLE swelling about 1 year ago at same time she started having unusual sensation in legs. Pt denies previously having lymphedema treatment. Pt reports paternal aunt had leg swelling. Pt is unable to wear compression stockings because she is unable to reach feet and distal legs to don and doff them due to back and hip pain.)  PERTINENT HISTORY: Relevant to lymphedema (OSA (denies using CPAP), BLE stasis dermatitis, HTN, DM 2 (Pt states she is prediabetic only), B glaucoma, Back and R hip OA  PAIN:  Are you having pain? Yes: NPRS scale: 5/10 Pain location: medial proximal thighs w abduction Pain description: like stickers Aggravating factors: standing, walking, dependent sitting Relieving factors: elevation  PRECAUTIONS: Fall and Other: LYMPHEDEMA precautions  (DM 2 and asthma)  WEIGHT BEARING RESTRICTIONS: No  FALLS:  Has patient fallen in last 6 months? yes Fell asleep at the computer and fell onto the floor [place] when storm door hit me  LIVING ENVIRONMENT: Lives with: alone Lives in: House/apartment Stairs: No;  Has following equipment at home: Environmental consultant - 4 wheeled  OCCUPATION: retired Scientist, research (life sciences)  LEISURE: reading and taking notes on current events and politics  HAND DOMINANCE: right   PRIOR LEVEL OF FUNCTION: Independent with household mobility with device, Requires assistive device for independence, Needs assistance with ADLs, Needs assistance with homemaking, and Needs assistance with gait  PATIENT GOALS: reduce swelling and keep it from getting worse  OBJECTIVE: Note: Objective measures were completed at Evaluation unless otherwise noted.  COGNITION:  Overall cognitive status: impaired short term  memory   OBSERVATIONS / OTHER ASSESSMENTS: Mild, Stage II, BLE Lymphedema 2/2 suspected venous insufficiency.   SENSATION: Reports uncomfortable scratching, or sticker-like  sensation on  anterior thighs   POSTURE: Raised walker handles 2 notches to increase upright spinal alignment when walking to limit falls risk. Handles need to be raised at least 1 hole more for safe upright posture when walking.  Upright sitting posture: head forward , shoulders forward and rounded- appears to be flexible kyphosis  LE ROM: WFL for ankles and knees. Pt reports limited hip abduction )  LE MMT: WFL FOR LYMPHEDEMA CARE. Presenting with generalized weakness and Pt reports debility  LYMPHEDEMA ASSESSMENTS:   SURGERY TYPE/DATE: Non-cancer related limb swelling  HX INFECTIONS: positive for 1 episode cellulitis treated w antibiotics  Hx WOUNDS: denies   BLE COMPARATIVE LIMB VOLUMETRICS: INITIAL 07/19/23  LANDMARK RIGHT (dominant)  R LEG (A-D) 3525.1 ml  R THIGH (E-G) ml  R FULL LIMB (A-G) ml  Limb Volume differential (LVD)  %  Volume change since initial %  Volume change overall V  (Blank rows = not tested)  LANDMARK LEFT    L LEG (A-D) 3769.6 ml  L THIGH (E-G) ml  L FULL LIMB (A-G) ml  Limb Volume differential (LVD)  Limb Volume Differential (LVD) measures 6.5%, L>R.  Volume change since initial %  Volume change overall %  RLE COMPARATIVE LIMB VOLUMETRICS: VISIT 9 08/15/23  LANDMARK RIGHT (dominant)  R LEG (A-D) 3549.4 ml  R THIGH (E-G) ml  R FULL LIMB (A-G) ml  Limb Volume differential (LVD)  %  Volume change since initial R LEG volume reduced by 5.84% since initially measured on 07/18/21  Volume change overall V  (Blank rows = not tested)  Mild, Stage  II, Bilateral Lower Extremity Lymphedema 2/2 CVI and Obesity  Skin  Description Hyper-Keratosis Peau' de Orange Shiny Tight Fibrotic/ Indurated Fatty Doughy Spongy/ boggy       R>L x  x   Skin dry Flaky WNL Macerated   mildly      Color Redness Varicosities Blanching Hemosiderin Stain Mottled   x     x   Odor Malodorous Yeast Fungal infection  WNL      x   Temperature Warm Cool wnl    x     Pitting  Edema   1+ 2+ 3+ 4+ Non-pitting         x   Girth Symmetrical Asymmetrical                   Distribution    R>L toes to groin    Stemmer Sign Positive Negative   +    Lymphorrhea History Of:  Present Absent     x    Wounds History Of Present Absent Venous Arterial Pressure Sheer     x        Signs of Infection Redness Warmth Erythema Acute Swelling Drainage Borders                    Sensation Light Touch Deep pressure Hypersensitivity   Present Impaired Present Impaired Absent Impaired   x Tactile  x  x     Nails WNL   Fungus nail dystrophy   x     Hair Growth Symmetrical Asymmetrical   x    Skin Creases Base of toes  Ankles   Base of Fingers knees       Abdominal pannus Thigh Lobules  Face/neck   x x  x      (Blank rows = not tested)    GAIT: Distance walked: >500' Assistive device utilized: Environmental consultant - 4 wheeled Level of assistance: Modified independence Comments: Pt bent at waist nearly 90 degrees with walker handles in lowest position  LYMPHEDEMA LIFE IMPACT SCALE (LLIS): TBA OT Rx visit 1  TREATMENT DATE:  RLE/RLQ MLD with simultaneous skin care as established RLE multilayer compression bandaging Pt edu for lymphedema self-care throughout session    PATIENT EDUCATION:  Continued Pt/ CG edu for lymphedema self care home program throughout session. Topics include outcome of comparative limb volumetrics- starting limb volume differentials (LVDs), technology and gradient techniques used for short stretch, multilayer compression wrapping, simple self-MLD, therapeutic lymphatic pumping exercises, skin/nail care, LE precautions, compression garment recommendations and specifications, wear and care schedule and compression garment donning / doffing w assistive devices. Discussed progress towards all OT goals since commencing CDT. Discussed detrimental impact of obesity on lower and upper extremity lymphedema over time. Reviewed OT goals for lymphedema care  with Pt and discussed progress to date.  All questions answered to the Pt's satisfaction. Good return. Person educated: Patient  Education method: Explanation, Demonstration, and Handouts Education comprehension: verbalized understanding, returned demonstration, verbal cues required, and needs further education   HOME EXERCISE PROGRAM: BLE lymphatic pumping there ex using- 1 sets of 10 reps, each  exercise in order-  1-2 x daily, bilaterally Simple self MLD 1 x daily Daily skin care to increase hydration, skin mobility and decrease infection risk- can be done during MLD During Intensive Phase CDT: Compression wraps 23/7 until limb volume reduction complete During Self-management Phase CDT: Fit with appropriate compression garments or alternatives. Consider BLE, knee length, Mediven, custom, CircAid , Velcro style leggings over soft cotton liners.  ASSESSMENT: CLINICAL IMPRESSION: Pt continues to demonstrate ongoing progress towards all goals. Continued MLD to RLE/RLQ utilizing functional inguinal LN, deep abdominal pathways and modified short neck sequence to avoid thyroid region (No strokes to lateral neck and very gentle strokes to R and L of sternal notch over terminus. Pt tolerated manual therapy without increased pain. Provided skin care to limit infection risk using castor oil to increase skin excursion during MLD. Pt educated throughout session on lymphedema self care. Reapplied multilayer, knee length compression wraps, adding newly fabricated, custom , Comprex foam kidneys be hind   each malleolus.. Reviewed bandaging reviewing simultaneously with wrapping. Cont as per POC.  (INITIAL EVAL 07/12/23: Sonal Graylin Pizza is a 73 yo female presenting with chronic, progressive, BLE swelling 2/2 unknown etiology, which at first glance appears to be circulatory in nature due to skin coloring at distal legs where swelling is most invested. Pt is presently unable to wear traditional elastic  compression garments due to difficulty donning and doffing them.  Chronic, progressive lymphedema with associated skin changes, including fibrosis, limits this patent's functional performance in all occupational domains, including functional ambulation and mobility, basic and instrumental ADLs (lower body dressing, LB bathing, fitting street shoes and LB clothing, driving, shopping, and home management. It also limits perform productive activities, leisure pursuits, and participation in social and community activities. BLE lymphedema contributes to elevated infection risk. Pt will benefit from skilled OT for Complete Decongestive Therapy (CDT), which  typically includes manual lymphatic drainage (MLD), skin care to limit' infection risk and increase skin excursion, lymphatic pumping exercise, and during the Intensive Phase multilayer, gradient compression bandaging to reduce limb volume. Once limb volume is reduced as much as possible, Pt is fitted with appropriate compression garments and/ or devices and transitions into the self management phase of care consisting of follow long support PRN.    Pt understands that her fair prognosis will become poor without daily assistance with compression wrapping since she is unable to reach feet and legs to apply them herself. Pt assures OT that she has a friend she believes is willing to assist her between OT sessions. )   OBJECTIVE IMPAIRMENTS: decreased standing and walking tolerance, activity tolerance, decreased balance, decreased knowledge of condition, decreased knowledge of use of DME, decreased mobility, decreased ROM, decreased strength, increased edema, impaired flexibility, impaired sensation, impaired UE functional use, impaired vision/reception, pain, and chronic, progressive, BLE swelling and associated skin changes, at increased infection risk   ACTIVITY LIMITATIONS: Functional ambulation and mobility (lifting, carrying, steps/stairs, transfers,  squatting) , Basic and instrumental ADLs, leisure pursuits, productive activities, social participation  PARTICIPATION LIMITATIONS: meal prep, cleaning, laundry, driving, shopping, yard work, and    PERSONAL FACTORS: Age, Fitness, Past/current experiences, Time since onset of injury/illness/exacerbation, 2+ co morbidities: OSA, HTN,  are also affecting patient's functional outcome.   REHAB POTENTIAL: Good  EVALUATION COMPLEXITY: Moderate   GOALS: Goals reviewed with patient? Yes  SHORT TERM GOALS: Target date: 4th OT Rx visit   Pt will demonstrate understanding of lymphedema precautions and prevention strategies with modified independence using a printed  reference to identify at least 5 precautions and discussing how s/he may implement them into daily life to reduce risk of progression with extra time. Baseline:Max A Goal status: GOAL MET  2.  Pt will be able to apply multilayer, knee length, gradient, compression wraps to one leg at a time from toes to below knee with max caregiver assist to decrease limb volume, to limit infection risk, and to limit lymphedema progression.  Baseline: Dependent Goal status: GOAL MET  LONG TERM GOALS: Target date: 09/19/23  1.Given this patient's Intake score of TBA % on the Lymphedema Life Impact Scale (LLIS), patient will experience a reduction of at least 5 points in her perceived level of functional impairment resulting from lymphedema to improve functional performance and quality of life (QOL). Baseline: TBA % Goal status:  GOAL DEFERRED  2.  Pt will achieve at least a 10% volume reduction in B legs to return limb to typical size and shape, to limit infection risk and LE progression, to decrease pain, to improve function. Baseline: Dependent Goal status: PROGRESSING. R LEG volume reduced by 5.84% since initially measured on 07/18/21  3.  Pt will obtain appropriate compression garments/devices and achieve modified independence (extra time +  assistive devices) with donning/doffing to optimize limb volume reductions and limit LE progression over time. Baseline: Dependent Goal status: PROGRESSING  During Intensive phase CDT, with max CG assistance, Pt will achieve at least 85% compliance with all adapted lymphedema self-care home program components, including daily skin care, compression wraps and /or garments, simple self MLD and lymphatic pumping therex to habituate LE self care protocol  into ADLs for optimal LE self-management over time. Baseline: Dependent Goal status:PROGRESSING  PLAN:  OT FREQUENCY: 2 x/week  OT DURATION: 12 weeks  PLANNED INTERVENTIONS:compression bandaging, skin care,  97110-Therapeutic exercises, 97530- Therapeutic activity, 97535- Self Care, Manual lymph drainage, DME instructions, and fit with appropriate compression  PLAN FOR NEXT SESSION:  Measure R leg to assess progress towards a stocking, or some alternative Cont Pt edu for LE self-care   Zebedee Dec, MS, OTR/L, CLT-LANA 09/21/23 3:58 PM

## 2023-09-22 ENCOUNTER — Other Ambulatory Visit: Payer: Self-pay | Admitting: Internal Medicine

## 2023-09-22 ENCOUNTER — Ambulatory Visit: Admitting: Internal Medicine

## 2023-09-22 DIAGNOSIS — E119 Type 2 diabetes mellitus without complications: Secondary | ICD-10-CM

## 2023-09-26 ENCOUNTER — Ambulatory Visit

## 2023-09-26 ENCOUNTER — Encounter: Payer: Self-pay | Admitting: Occupational Therapy

## 2023-09-26 ENCOUNTER — Ambulatory Visit: Admitting: Occupational Therapy

## 2023-09-26 DIAGNOSIS — M5459 Other low back pain: Secondary | ICD-10-CM | POA: Diagnosis not present

## 2023-09-26 DIAGNOSIS — I890 Lymphedema, not elsewhere classified: Secondary | ICD-10-CM | POA: Diagnosis not present

## 2023-09-26 DIAGNOSIS — M25551 Pain in right hip: Secondary | ICD-10-CM

## 2023-09-26 DIAGNOSIS — M6281 Muscle weakness (generalized): Secondary | ICD-10-CM

## 2023-09-26 DIAGNOSIS — R262 Difficulty in walking, not elsewhere classified: Secondary | ICD-10-CM | POA: Diagnosis not present

## 2023-09-26 DIAGNOSIS — R278 Other lack of coordination: Secondary | ICD-10-CM

## 2023-09-26 NOTE — Therapy (Signed)
 OUTPATIENT PHYSICAL THERAPY THORACOLUMBAR TREATMENT   Patient Name: Judith Arnold MRN: [id number] DOB:October 28, 1950, 73 y.o., female Today's Date: 09/27/2023  END OF SESSION:  PT End of Session - 09/26/23 1624     Visit Number 2    Number of Visits 25    Date for PT Re-Evaluation 12/12/23    PT Start Time 1620    PT Stop Time 1700    PT Time Calculation (min) 40 min    Equipment Utilized During Treatment Gait belt    Activity Tolerance Patient tolerated treatment well;Patient limited by pain    Behavior During Therapy WFL for tasks assessed/performed          Past Medical History:  Diagnosis Date   Allergy    Arthritis    lower back, left shoulder   Asthma    Carpal tunnel syndrome on both sides    Dental bridge present    top   Dental crown present    multiple   Family history of adverse reaction to anesthesia    Father and sister - PONV   GERD (gastroesophageal reflux disease)    Glaucoma    Hypertension    Leaky heart valve    Pre-diabetes    Sleep apnea    Past Surgical History:  Procedure Laterality Date   CATARACT EXTRACTION W/PHACO Right 11/16/2016   Procedure: CATARACT EXTRACTION PHACO AND INTRAOCULAR LENS PLACEMENT (IOC)  right;  Surgeon: Mittie Gaskin, MD;  Location: [facility];  Service: Ophthalmology;  Laterality: Right;  pre diabetic latex sensitivity sleep apnea   CATARACT EXTRACTION W/PHACO Left 01/18/2017   Procedure: CATARACT EXTRACTION PHACO AND INTRAOCULAR LENS PLACEMENT (IOC) LEFT DIABETIC;  Surgeon: Mittie Gaskin, MD;  Location: [facility];  Service: Ophthalmology;  Laterality: Left;   COLONOSCOPY     COLONOSCOPY WITH PROPOFOL  N/A 04/09/2018   Procedure: COLONOSCOPY WITH PROPOFOL ;  Surgeon: Viktoria Lamar DASEN, MD;  Location: [facility];  Service: Endoscopy;  Laterality: N/A;   IRIDOTOMY / IRIDECTOMY Bilateral 2005   TOOTH EXTRACTION     Patient Active Problem List   Diagnosis Date Noted   Hypertension  associated with diabetes (HCC) 01/20/2023   Combined hyperlipidemia associated with type 2 diabetes mellitus (HCC) 01/20/2023   Intertrigo 11/01/2022   Stasis dermatitis of both legs 10/21/2022   Leg swelling 10/21/2022   Lymphedema of lower extremity 10/21/2022   Type 2 diabetes mellitus without complication, without long-term current use of insulin (HCC) 05/24/2022   Mixed hyperlipidemia 05/24/2022   Essential hypertension, benign 05/24/2022   Absolute anemia 05/24/2022   Gastroesophageal reflux disease without esophagitis 05/24/2022   Allergic rhinitis 05/24/2022   Asthma, allergic, moderate persistent, uncomplicated 05/24/2022   Bilateral chronic angle-closure glaucoma, indeterminate stage 05/24/2022   Airway hyperreactivity 08/29/2013   OSA on CPAP 08/29/2013    PCP: Fernand Fredy RAMAN, MD  REFERRING PROVIDER: Fernand Fredy, RAMAN, MD  REFERRING DIAG:  Diagnosis  G89.29,M54.41 (ICD-10-CM) - Chronic bilateral low back pain with right-sided sciatica    Rationale for Evaluation and Treatment: Rehabilitation  THERAPY DIAG:  Other low back pain  Pain in right hip  Muscle weakness (generalized)  Difficulty in walking, not elsewhere classified  Other lack of coordination  ONSET DATE: >3 years   SUBJECTIVE:  SUBJECTIVE STATEMENT:  Today: Patient reports having trouble walking and would like to be more flexible. She reports bad arthritis in low back and unable to stand up straight and ongoing back and knee pain.     From EVAL: Pt is a pleasant 73 y/o female presenting to PT eval for chronic LBP with R side hip pain. She reports onset of LBP over 3 years ago. Primary site of pain is mostly felt in R hip and lower spine. Pt reports hx of arthritis in lower back and R hip. She is now starting to  feel arthritis pain in her knees as well. Reports RLE is actually her good leg, has some issues with pain into groin region of LLE. Pt states strength in legs has been impacted and that her general mobility is worse. Pt can only ambulate comfortably by taking weight off of lower back by using her 4WW. She states not using 4WW so much for balance, but for reducing pain with gait. She is unable to stand up fully due to pain. Pt says she used to be very fit (took take karate, gymnastics years ago). She is also seeing OT for BLE lymphedema management. She endorses difficulty getting out of chairs, difficulty with steps, she must use a ramp to get in and out of her home. Pt reports my brain does not retain what I'm talking about, can lose train of thought.  PERTINENT HISTORY:  PMH per chart includes arthritis low back and left shoulder, bilateral carpal tunnel, asthma, glaucoma, HTN, leaky heart valve, sleep apnea, pre-diabetes, being seen by lymphedema specialist for BLE lymphedema   PAIN:  Are you having pain? LBP and R hip pain  Lowest pain level: 0/10  With standing: 2/10  Worst: 5-6/10   PRECAUTIONS: LATEX ALLERGY per chart, fall  RED FLAGS: None   WEIGHT BEARING RESTRICTIONS: No  FALLS:  Has patient fallen in last 6 months? No  LIVING ENVIRONMENT: Uses ramp to get into home due to difficulty with steps, has 4WW  PLOF: Independent  PATIENT GOALS:  Says due to back pain she is unable to reach my feet, has difficulty getting her socks on without a grabber, wants to be more flexible and stronger to increase ease with these activities and mobility    OBJECTIVE:  Note: Objective measures were completed at Evaluation unless otherwise noted.  DIAGNOSTIC FINDINGS:  No recent pertinent imaging available in chart  PATIENT SURVEYS:  Modified Oswestry score: 34%  Interpretation of scores: Score Category Description  0-20% Minimal Disability The patient can cope with most living  activities. Usually no treatment is indicated apart from advice on lifting, sitting and exercise  21-40% Moderate Disability The patient experiences more pain and difficulty with sitting, lifting and standing. Travel and social life are more difficult and they may be disabled from work. Personal care, sexual activity and sleeping are not grossly affected, and the patient can usually be managed by conservative means  41-60% Severe Disability Pain remains the main problem in this group, but activities of daily living are affected. These patients require a detailed investigation  61-80% Crippled Back pain impinges on all aspects of the patient's life. Positive intervention is required  81-100% Bed-bound  These patients are either bed-bound or exaggerating their symptoms  Bluford FORBES Zoe DELENA Karon DELENA, et al. Surgery versus conservative management of stable thoracolumbar fracture: the PRESTO feasibility RCT. [place] ([place]): [employer]; 2021 Nov. [facility] Technology Assessment, No. 25.62.) Appendix 3, Oswestry Disability Index category  descriptors. Available from: [email or web address]cz  Minimally Clinically Important Difference (MCID) = 12.8%  COGNITION: Overall cognitive status: Within functional limits for tasks assessed, very pleasant pt can become tangential and requires redirection to activity    SENSATION: Intact to light touch BLE   MUSCLE LENGTH: deferred  POSTURE: increased thoracic kyphosis, rounded shoulders, unable to achieve full upright position due to pain, maintains increased hip and knee flexion when attempting to stand fully upright   PALPATION: deferred  Thoracolumbar AROM: Extension - unable to achieve neutral position, remains in significant flexion  Flexion - lacking at least 25%  Rotation - lacking at least 40% bilat  and pain-limited    LOWER EXTREMITY MMT:    MMT Right eval Left eval  Hip flexion 4- 3*  Hip extension    Hip  abduction 4+ 4+  Hip adduction 4+ 4+  Hip internal rotation    Hip external rotation    Knee flexion 4 3*  Knee extension 4+ 4+  Ankle dorsiflexion 4 4+  Ankle plantarflexion    Ankle inversion    Ankle eversion     (Blank rows = not tested) *=pain limited   LUMBAR SPECIAL TESTS:  deferred  FUNCTIONAL TESTS:  10 meter walk test: 0.63 m/s crouched posture, decreased hip extension, decreased step-length and heel strike, elevated R>L shoulder, heavy BUE weightbearing on 4WW  GAIT: Distance walked: 10MWT/clinic distances (see above) Assistive device utilized: Environmental consultant - 4 wheeled Level of assistance: Modified independence Comments: gait mechanics impaired: decreased gait speed, crouched posture, decreased hip extension, decreased step-length and heel strike, elevated R>L shoulder, heavy BUE weightbearing on 4WW  TREATMENT DATE: 09/26/2023  Physical Performance Test or Measurement: a  physical performance test or measurement (eg,  musculoskeletal, functional capacity), with written report.  Pt performed 5 time sit<>stand (5xSTS): 26.46 sec with heavy UE Support (report of some right ankle pain as well as right hip and back pain)  (>15 sec indicates increased fall risk)     [facility] PT Assessment - 09/26/23 1633       Standardized Balance Assessment   Standardized Balance Assessment Berg Balance Test (P)       Berg Balance Test   Sit to Stand Able to stand  independently using hands (P)     Standing Unsupported Able to stand 2 minutes with supervision (P)     Sitting with Back Unsupported but Feet Supported on Floor or Stool Able to sit safely and securely 2 minutes (P)     Stand to Sit Controls descent by using hands (P)     Transfers Able to transfer safely, definite need of hands (P)     Standing Unsupported with Eyes Closed Able to stand 10 seconds with supervision (P)     Standing Unsupported with Feet Together Able to place feet together independently and stand for 1 minute  with supervision (P)     From Standing, Reach Forward with Outstretched Arm Reaches forward but needs supervision (P)     From Standing Position, Pick up Object from Floor Unable to pick up and needs supervision (P)     From Standing Position, Turn to Look Behind Over each Shoulder Turn sideways only but maintains balance (P)     Turn 360 Degrees Needs close supervision or verbal cueing (P)     Standing Unsupported, Alternately Place Feet on Step/Stool Able to complete >2 steps/needs minimal assist (P)     Standing Unsupported, One Foot in Front Able to take small step independently  and hold 30 seconds (P)     Standing on One Leg Tries to lift leg/unable to hold 3 seconds but remains standing independently (P)     Total Score 31 (P)                                                                                                                        PATIENT EDUCATION:  Education details: Provided education on findings of exam, indications for plan, prognoses, goals, how PT can help                 Person educated: Patient Education method: Explanation Education comprehension: verbalized understanding  HOME EXERCISE PROGRAM: To be initiated next 1-2 visits   ASSESSMENT:  CLINICAL IMPRESSION: Patient is a pleasant 73 y.o. female who was seen today for physical therapy treatment for chronic LBP with R side sciatica. Exam findings indicate impairments of strength, mobility, pain, and gait. Formal balance assessment completed today and patient presents with significant balance impairment scoring 31/56 on BERG indicating increased risk of falling. Advised her to continue to use walker for now for all mobility. She also presents with increased overall BLE muscle weakness as seen by increased time and upper extremity support with 5x STS. The pt will benefit from further skilled PT to address strength, gait, mobility, balance and pain to increase ease and safety with ADLS and QOL.  OBJECTIVE  IMPAIRMENTS: Abnormal gait, decreased activity tolerance, decreased balance, decreased mobility, difficulty walking, decreased strength, hypomobility, increased edema, improper body mechanics, postural dysfunction, and pain.   ACTIVITY LIMITATIONS: carrying, lifting, bending, squatting, stairs, transfers, bed mobility, and locomotion level  PARTICIPATION LIMITATIONS: meal prep, cleaning, shopping, community activity, and yard work  PERSONAL FACTORS: Age, Fitness, Sex, Time since onset of injury/illness/exacerbation, and 3+ comorbidities: PMH per chart includes arthritis low back and left shoulder, bilateral carpal tunnel, asthma, glaucoma, HTN, leaky heart valve, sleep apnea, pre-diabetes, being seen by lymphedema specialist for BLE lymphedema  are also affecting patient's functional outcome.   REHAB POTENTIAL: Good  CLINICAL DECISION MAKING: Evolving/moderate complexity  EVALUATION COMPLEXITY: Moderate   GOALS: Goals reviewed with patient? Yes   SHORT TERM GOALS: Target date: 10/31/2023    Patient will be independent in home exercise program to improve strength/mobility for better functional independence with ADLs. Baseline:to be initiated  Goal status: INITIAL   LONG TERM GOALS: Target date: 12/12/2023    Patient will reduce modified Oswestry score to <20 as to demonstrate minimal disability with ADLs including improved sleeping tolerance, walking/sitting tolerance etc for better mobility with ADLs.  Baseline: 32 Goal status: INITIAL  2.  Patient (> 34 years old) will complete five times sit to stand test in < 15 seconds indicating an increased LE strength and improved balance. Baseline: 09/26/2023= 26.46 sec with heavy UE Support Goal status: INITIAL  3.  Patient will increase Berg Balance score by > 6 points to demonstrate decreased fall risk during functional activities Baseline: 09/26/2023= 31/56 Goal status: INITIAL  4.  Patient will increase 10 meter walk test to  >1.95m/s as to improve gait speed for better community ambulation and to reduce fall risk. Baseline:  0.63 m/s with 4WW Goal status: INITIAL      PLAN:  PT FREQUENCY: 1-2x/week  PT DURATION: 12 weeks  PLANNED INTERVENTIONS: [id number]- PT Re-evaluation, 97750- Physical Performance Testing, 97110-Therapeutic exercises, 97530- Therapeutic activity, [id number]- Neuromuscular re-education, 97535- Self Care, [id number]- Manual therapy, [id number]- Gait training, [phone]- Orthotic Initial, [phone]- Orthotic/Prosthetic subsequent, [phone]- Canalith repositioning, Patient/Family education, Balance training, Stair training, Taping, Joint mobilization, Spinal mobilization, Vestibular training, DME instructions, Cryotherapy, and Moist heat.  PLAN FOR NEXT SESSION:  initiate HEP Manual therapy for lumbar ROM/pain Therex for ROM, Low back/LE/Core strengthening   Reyes LOISE London, PT 09/27/2023, 12:12 PM

## 2023-09-26 NOTE — Therapy (Signed)
 OUTPATIENT OCCUPATIONAL THERAPY TREATMENT NOTE  BILATERAL LOWER EXTREMITY LYMPHEDEMA  Patient Name: Judith Arnold MRN: [id number] DOB:1951/02/04, 73 y.o., female Today's Date: 09/26/2023  REPORTING PERIOD:    END OF SESSION:   OT End of Session - 09/26/23 1510     Visit Number 17    Number of Visits 36    Date for OT Re-Evaluation 10/10/23    OT Start Time 0300    OT Stop Time 0400    OT Time Calculation (min) 60 min    Equipment Utilized During Treatment sock donner; compression garment samples    Activity Tolerance Patient tolerated treatment well;No increased pain    Behavior During Therapy WFL for tasks assessed/performed           Past Medical History:  Diagnosis Date   Allergy    Arthritis    lower back, left shoulder   Asthma    Carpal tunnel syndrome on both sides    Dental bridge present    top   Dental crown present    multiple   Family history of adverse reaction to anesthesia    Father and sister - PONV   GERD (gastroesophageal reflux disease)    Glaucoma    Hypertension    Leaky heart valve    Pre-diabetes    Sleep apnea    Past Surgical History:  Procedure Laterality Date   CATARACT EXTRACTION W/PHACO Right 11/16/2016   Procedure: CATARACT EXTRACTION PHACO AND INTRAOCULAR LENS PLACEMENT (IOC)  right;  Surgeon: Mittie Gaskin, MD;  Location: [facility];  Service: Ophthalmology;  Laterality: Right;  pre diabetic latex sensitivity sleep apnea   CATARACT EXTRACTION W/PHACO Left 01/18/2017   Procedure: CATARACT EXTRACTION PHACO AND INTRAOCULAR LENS PLACEMENT (IOC) LEFT DIABETIC;  Surgeon: Mittie Gaskin, MD;  Location: [facility];  Service: Ophthalmology;  Laterality: Left;   COLONOSCOPY     COLONOSCOPY WITH PROPOFOL  N/A 04/09/2018   Procedure: COLONOSCOPY WITH PROPOFOL ;  Surgeon: Viktoria Lamar DASEN, MD;  Location: [facility];  Service: Endoscopy;  Laterality: N/A;   IRIDOTOMY / IRIDECTOMY Bilateral 2005   TOOTH  EXTRACTION     Patient Active Problem List   Diagnosis Date Noted   Hypertension associated with diabetes (HCC) 01/20/2023   Combined hyperlipidemia associated with type 2 diabetes mellitus (HCC) 01/20/2023   Intertrigo 11/01/2022   Stasis dermatitis of both legs 10/21/2022   Leg swelling 10/21/2022   Lymphedema of lower extremity 10/21/2022   Type 2 diabetes mellitus without complication, without long-term current use of insulin (HCC) 05/24/2022   Mixed hyperlipidemia 05/24/2022   Essential hypertension, benign 05/24/2022   Absolute anemia 05/24/2022   Gastroesophageal reflux disease without esophagitis 05/24/2022   Allergic rhinitis 05/24/2022   Asthma, allergic, moderate persistent, uncomplicated 05/24/2022   Bilateral chronic angle-closure glaucoma, indeterminate stage 05/24/2022   Airway hyperreactivity 08/29/2013   OSA on CPAP 08/29/2013    PCP: Fredy GORMAN Bathe, MD  REFERRING PROVIDER: same  REFERRING DIAG: lymphedema I89.0  THERAPY DIAG:  Lymphedema, not elsewhere classified  Rationale for Evaluation and Treatment: Rehabilitation  ONSET DATE: Pt reports onset of L ankle / leg swelling after injury 20-30 yrs ago. Then about 1 year ago legs started swelling and Pt having prickly sensation on shins and thighs bilaterally.   SUBJECTIVE:  SUBJECTIVE STATEMENT: Judith Arnold presents to OT for treatment of BLE lymphedema 2/2 suspected CVI and obesity. Pt is unaccompanied today. Pt states, I have PT today after this, with a boy, I believe. Pt endorses pain in L leg 3-4/10. It just wont go up. I can't make it (L leg) work.   (INITIAL EVAL 07/12/23 : Judith Arnold is referred to Occupational Therapy by Fredy GORMAN Bathe, MD,  for evaluation and treatment of BLE lymphedema.  Pt reports  onset of L ankle and distal leg swelling about 30 years ago, then noticed RLE swelling and worsening LLE swelling about 1 year ago at same time she started having unusual sensation in legs. Pt denies previously having lymphedema treatment. Pt reports paternal aunt had leg swelling. Pt is unable to wear compression stockings because she is unable to reach feet and distal legs to don and doff them due to back and hip pain.)  PERTINENT HISTORY: Relevant to lymphedema (OSA (denies using CPAP), BLE stasis dermatitis, HTN, DM 2 (Pt states she is prediabetic only), B glaucoma, Back and R hip OA  PAIN:  Are you having pain? Yes: NPRS scale: 3/10 Pain location: medial proximal thighs w abduction Pain description: like stickers Aggravating factors: standing, walking, dependent sitting Relieving factors: elevation  PRECAUTIONS: Fall and Other: LYMPHEDEMA precautions  (DM 2 and asthma)  WEIGHT BEARING RESTRICTIONS: No  FALLS:  Has patient fallen in last 6 months? yes Fell asleep at the computer and fell onto the floor [place] when storm door hit me  LIVING ENVIRONMENT: Lives with: alone Lives in: House/apartment Stairs: No;  Has following equipment at home: Environmental consultant - 4 wheeled  OCCUPATION: retired Scientist, research (life sciences)  LEISURE: reading and taking notes on current events and politics  HAND DOMINANCE: right   PRIOR LEVEL OF FUNCTION: Independent with household mobility with device, Requires assistive device for independence, Needs assistance with ADLs, Needs assistance with homemaking, and Needs assistance with gait  PATIENT GOALS: reduce swelling and keep it from getting worse  OBJECTIVE: Note: Objective measures were completed at Evaluation unless otherwise noted.  COGNITION:  Overall cognitive status: impaired short term  memory   OBSERVATIONS / OTHER ASSESSMENTS: Mild, Stage II, BLE Lymphedema 2/2 suspected venous insufficiency.   SENSATION: Reports uncomfortable scratching, or  sticker-like  sensation on anterior thighs   POSTURE: Raised walker handles 2 notches to increase upright spinal alignment when walking to limit falls risk. Handles need to be raised at least 1 hole more for safe upright posture when walking.  Upright sitting posture: head forward , shoulders forward and rounded- appears to be flexible kyphosis  LE ROM: WFL for ankles and knees. Pt reports limited hip abduction )  LE MMT: WFL FOR LYMPHEDEMA CARE. Presenting with generalized weakness and Pt reports debility  LYMPHEDEMA ASSESSMENTS:   SURGERY TYPE/DATE: Non-cancer related limb swelling  HX INFECTIONS: positive for 1 episode cellulitis treated w antibiotics  Hx WOUNDS: denies   BLE COMPARATIVE LIMB VOLUMETRICS: INITIAL 07/19/23  LANDMARK RIGHT (dominant)  R LEG (A-D) 3525.1 ml  R THIGH (E-G) ml  R FULL LIMB (A-G) ml  Limb Volume differential (LVD)  %  Volume change since initial %  Volume change overall V  (Blank rows = not tested)  LANDMARK LEFT    L LEG (A-D) 3769.6 ml  L THIGH (E-G) ml  L FULL LIMB (A-G) ml  Limb Volume differential (LVD)  Limb Volume Differential (LVD) measures 6.5%, L>R.  Volume change since initial %  Volume  change overall %    RLE COMPARATIVE LIMB VOLUMETRICS: VISIT 9 08/15/23  LANDMARK RIGHT (dominant)  R LEG (A-D) 3549.4 ml  R THIGH (E-G) ml  R FULL LIMB (A-G) ml  Limb Volume differential (LVD)  %  Volume change since initial R LEG volume reduced by 5.84% since initially measured on 07/18/21  Volume change overall V  (Blank rows = not tested)  Mild, Stage  II, Bilateral Lower Extremity Lymphedema 2/2 CVI and Obesity  Skin  Description Hyper-Keratosis Peau' de Orange Shiny Tight Fibrotic/ Indurated Fatty Doughy Spongy/ boggy       R>L x  x   Skin dry Flaky WNL Macerated   mildly      Color Redness Varicosities Blanching Hemosiderin Stain Mottled   x     x   Odor Malodorous Yeast Fungal infection  WNL      x   Temperature Warm Cool  wnl    x     Pitting Edema   1+ 2+ 3+ 4+ Non-pitting         x   Girth Symmetrical Asymmetrical                   Distribution    R>L toes to groin    Stemmer Sign Positive Negative   +    Lymphorrhea History Of:  Present Absent     x    Wounds History Of Present Absent Venous Arterial Pressure Sheer     x        Signs of Infection Redness Warmth Erythema Acute Swelling Drainage Borders                    Sensation Light Touch Deep pressure Hypersensitivity   Present Impaired Present Impaired Absent Impaired   x Tactile  x  x     Nails WNL   Fungus nail dystrophy   x     Hair Growth Symmetrical Asymmetrical   x    Skin Creases Base of toes  Ankles   Base of Fingers knees       Abdominal pannus Thigh Lobules  Face/neck   x x  x      (Blank rows = not tested)    GAIT: Distance walked: >500' Assistive device utilized: Environmental consultant - 4 wheeled Level of assistance: Modified independence Comments: Pt bent at waist nearly 90 degrees with walker handles in lowest position  LYMPHEDEMA LIFE IMPACT SCALE (LLIS): TBA OT Rx visit 1  TREATMENT DATE:  RLE/RLQ MLD with simultaneous skin care as established RLE multilayer compression bandaging Pt edu for lymphedema self-care throughout session    PATIENT EDUCATION:  Continued Pt/ CG edu for lymphedema self care home program throughout session. Topics include outcome of comparative limb volumetrics- starting limb volume differentials (LVDs), technology and gradient techniques used for short stretch, multilayer compression wrapping, simple self-MLD, therapeutic lymphatic pumping exercises, skin/nail care, LE precautions, compression garment recommendations and specifications, wear and care schedule and compression garment donning / doffing w assistive devices. Discussed progress towards all OT goals since commencing CDT. Discussed detrimental impact of obesity on lower and upper extremity lymphedema over time. Reviewed OT  goals for lymphedema care with Pt and discussed progress to date.  All questions answered to the Pt's satisfaction. Good return. Person educated: Patient  Education method: Explanation, Demonstration, and Handouts Education comprehension: verbalized understanding, returned demonstration, verbal cues required, and needs further education   HOME EXERCISE PROGRAM: BLE lymphatic pumping there ex using-  1 sets of 10 reps, each exercise in order-  1-2 x daily, bilaterally Simple self MLD 1 x daily Daily skin care to increase hydration, skin mobility and decrease infection risk- can be done during MLD During Intensive Phase CDT: Compression wraps 23/7 until limb volume reduction complete During Self-management Phase CDT: Fit with appropriate compression garments or alternatives. Consider BLE, knee length, Mediven, custom, CircAid , Velcro style leggings over soft cotton liners.  ASSESSMENT: CLINICAL IMPRESSION: Pt edu provided today for compression garment options and alternatives. Demonstrated CircAid adjustable garment alternative. Pt struggles to reach feet due to hip and lower back AROM and pain. The LLE is also limiting flexibility. Pt agrees to practice putting on socks at home between sessions and to perform slow stretching.   Pt continues to demonstrate ongoing progress towards all goals. Continued MLD to RLE/RLQ utilizing functional inguinal LN, deep abdominal pathways and modified short neck sequence to avoid thyroid region (No strokes to lateral neck and very gentle strokes to R and L of sternal notch over terminus. Pt tolerated manual therapy without increased pain. Provided skin care to limit infection risk using castor oil to increase skin excursion during MLD. Pt educated throughout session on lymphedema self care. Reapplied multilayer, knee length compression wraps, adding newly fabricated, custom , Comprex foam kidneys be hind   each malleolus.. Reviewed bandaging reviewing simultaneously  with wrapping. Cont as per POC.  (INITIAL EVAL 07/12/23: Judith Arnold is a 73 yo female presenting with chronic, progressive, BLE swelling 2/2 unknown etiology, which at first glance appears to be circulatory in nature due to skin coloring at distal legs where swelling is most invested. Pt is presently unable to wear traditional elastic compression garments due to difficulty donning and doffing them.  Chronic, progressive lymphedema with associated skin changes, including fibrosis, limits this patent's functional performance in all occupational domains, including functional ambulation and mobility, basic and instrumental ADLs (lower body dressing, LB bathing, fitting street shoes and LB clothing, driving, shopping, and home management. It also limits perform productive activities, leisure pursuits, and participation in social and community activities. BLE lymphedema contributes to elevated infection risk. Pt will benefit from skilled OT for Complete Decongestive Therapy (CDT), which  typically includes manual lymphatic drainage (MLD), skin care to limit' infection risk and increase skin excursion, lymphatic pumping exercise, and during the Intensive Phase multilayer, gradient compression bandaging to reduce limb volume. Once limb volume is reduced as much as possible, Pt is fitted with appropriate compression garments and/ or devices and transitions into the self management phase of care consisting of follow long support PRN.    Pt understands that her fair prognosis will become poor without daily assistance with compression wrapping since she is unable to reach feet and legs to apply them herself. Pt assures OT that she has a friend she believes is willing to assist her between OT sessions. )   OBJECTIVE IMPAIRMENTS: decreased standing and walking tolerance, activity tolerance, decreased balance, decreased knowledge of condition, decreased knowledge of use of DME, decreased mobility, decreased ROM,  decreased strength, increased edema, impaired flexibility, impaired sensation, impaired UE functional use, impaired vision/reception, pain, and chronic, progressive, BLE swelling and associated skin changes, at increased infection risk   ACTIVITY LIMITATIONS: Functional ambulation and mobility (lifting, carrying, steps/stairs, transfers, squatting) , Basic and instrumental ADLs, leisure pursuits, productive activities, social participation  PARTICIPATION LIMITATIONS: meal prep, cleaning, laundry, driving, shopping, yard work, and    PERSONAL FACTORS: Age, Fitness, Past/current experiences, Time since onset  of injury/illness/exacerbation, 2+ co morbidities: OSA, HTN,  are also affecting patient's functional outcome.   REHAB POTENTIAL: Good  EVALUATION COMPLEXITY: Moderate   GOALS: Goals reviewed with patient? Yes  SHORT TERM GOALS: Target date: 4th OT Rx visit   Pt will demonstrate understanding of lymphedema precautions and prevention strategies with modified independence using a printed reference to identify at least 5 precautions and discussing how s/he may implement them into daily life to reduce risk of progression with extra time. Baseline:Max A Goal status: GOAL MET  2.  Pt will be able to apply multilayer, knee length, gradient, compression wraps to one leg at a time from toes to below knee with max caregiver assist to decrease limb volume, to limit infection risk, and to limit lymphedema progression.  Baseline: Dependent Goal status: GOAL MET  LONG TERM GOALS: Target date: 09/19/23  1.Given this patient's Intake score of TBA % on the Lymphedema Life Impact Scale (LLIS), patient will experience a reduction of at least 5 points in her perceived level of functional impairment resulting from lymphedema to improve functional performance and quality of life (QOL). Baseline: TBA % Goal status:  GOAL DEFERRED  2.  Pt will achieve at least a 10% volume reduction in B legs to return limb  to typical size and shape, to limit infection risk and LE progression, to decrease pain, to improve function. Baseline: Dependent Goal status: PROGRESSING. R LEG volume reduced by 5.84% since initially measured on 07/18/21  3.  Pt will obtain appropriate compression garments/devices and achieve modified independence (extra time + assistive devices) with donning/doffing to optimize limb volume reductions and limit LE progression over time. Baseline: Dependent Goal status: PROGRESSING  During Intensive phase CDT, with max CG assistance, Pt will achieve at least 85% compliance with all adapted lymphedema self-care home program components, including daily skin care, compression wraps and /or garments, simple self MLD and lymphatic pumping therex to habituate LE self care protocol  into ADLs for optimal LE self-management over time. Baseline: Dependent Goal status:PROGRESSING  PLAN:  OT FREQUENCY: 2 x/week  OT DURATION: 12 weeks  PLANNED INTERVENTIONS:compression bandaging, skin care,  97110-Therapeutic exercises, 97530- Therapeutic activity, 97535- Self Care, Manual lymph drainage, DME instructions, and fit with appropriate compression  PLAN FOR NEXT SESSION:  Measure R leg to assess progress towards a stocking, or some alternative Cont Pt edu for LE self-care   Zebedee Dec, MS, OTR/L, CLT-LANA 09/26/23 4:02 PM

## 2023-09-28 ENCOUNTER — Encounter: Payer: Self-pay | Admitting: Occupational Therapy

## 2023-09-28 ENCOUNTER — Encounter: Admitting: Physical Therapy

## 2023-09-28 ENCOUNTER — Ambulatory Visit: Admitting: Occupational Therapy

## 2023-09-28 DIAGNOSIS — R262 Difficulty in walking, not elsewhere classified: Secondary | ICD-10-CM | POA: Diagnosis not present

## 2023-09-28 DIAGNOSIS — R278 Other lack of coordination: Secondary | ICD-10-CM | POA: Diagnosis not present

## 2023-09-28 DIAGNOSIS — I890 Lymphedema, not elsewhere classified: Secondary | ICD-10-CM | POA: Diagnosis not present

## 2023-09-28 DIAGNOSIS — M6281 Muscle weakness (generalized): Secondary | ICD-10-CM | POA: Diagnosis not present

## 2023-09-28 DIAGNOSIS — M25551 Pain in right hip: Secondary | ICD-10-CM | POA: Diagnosis not present

## 2023-09-28 DIAGNOSIS — M5459 Other low back pain: Secondary | ICD-10-CM | POA: Diagnosis not present

## 2023-09-28 NOTE — Progress Notes (Signed)
   09/28/2023  Patient ID: Judith Arnold, female   DOB: 03-15-1950, 73 y.o.   MRN: [id number]  Pharmacy Quality Measure Review  This patient is appearing on a report for being at risk of failing the adherence measure for hypertension (ACEi/ARB) medications this calendar year.   Medication: Valsartan  Last fill date: 05/23/23 for 90 day supply  Medication has been discontinued. No further action needed at this time.  Jon VEAR Lindau, PharmD Clinical Pharmacist [phone]

## 2023-09-28 NOTE — Therapy (Signed)
 OUTPATIENT OCCUPATIONAL THERAPY TREATMENT NOTE  BILATERAL LOWER EXTREMITY LYMPHEDEMA  Patient Name: MAKYNLIE ROSSINI MRN: [id number] DOB:1950-06-17, 73 y.o., female Today's Date: 09/28/2023  REPORTING PERIOD:    END OF SESSION:   OT End of Session - 09/28/23 1121     Visit Number 18    Number of Visits 36    Date for OT Re-Evaluation 10/10/23    OT Start Time 1112    OT Stop Time 1215    OT Time Calculation (min) 63 min    Equipment Utilized During Treatment sock donner; compression garment samples    Activity Tolerance Patient tolerated treatment well;No increased pain    Behavior During Therapy WFL for tasks assessed/performed           Past Medical History:  Diagnosis Date   Allergy    Arthritis    lower back, left shoulder   Asthma    Carpal tunnel syndrome on both sides    Dental bridge present    top   Dental crown present    multiple   Family history of adverse reaction to anesthesia    Father and sister - PONV   GERD (gastroesophageal reflux disease)    Glaucoma    Hypertension    Leaky heart valve    Pre-diabetes    Sleep apnea    Past Surgical History:  Procedure Laterality Date   CATARACT EXTRACTION W/PHACO Right 11/16/2016   Procedure: CATARACT EXTRACTION PHACO AND INTRAOCULAR LENS PLACEMENT (IOC)  right;  Surgeon: Mittie Gaskin, MD;  Location: [facility];  Service: Ophthalmology;  Laterality: Right;  pre diabetic latex sensitivity sleep apnea   CATARACT EXTRACTION W/PHACO Left 01/18/2017   Procedure: CATARACT EXTRACTION PHACO AND INTRAOCULAR LENS PLACEMENT (IOC) LEFT DIABETIC;  Surgeon: Mittie Gaskin, MD;  Location: [facility];  Service: Ophthalmology;  Laterality: Left;   COLONOSCOPY     COLONOSCOPY WITH PROPOFOL  N/A 04/09/2018   Procedure: COLONOSCOPY WITH PROPOFOL ;  Surgeon: Viktoria Lamar DASEN, MD;  Location: [facility];  Service: Endoscopy;  Laterality: N/A;   IRIDOTOMY / IRIDECTOMY Bilateral 2005   TOOTH  EXTRACTION     Patient Active Problem List   Diagnosis Date Noted   Hypertension associated with diabetes (HCC) 01/20/2023   Combined hyperlipidemia associated with type 2 diabetes mellitus (HCC) 01/20/2023   Intertrigo 11/01/2022   Stasis dermatitis of both legs 10/21/2022   Leg swelling 10/21/2022   Lymphedema of lower extremity 10/21/2022   Type 2 diabetes mellitus without complication, without long-term current use of insulin (HCC) 05/24/2022   Mixed hyperlipidemia 05/24/2022   Essential hypertension, benign 05/24/2022   Absolute anemia 05/24/2022   Gastroesophageal reflux disease without esophagitis 05/24/2022   Allergic rhinitis 05/24/2022   Asthma, allergic, moderate persistent, uncomplicated 05/24/2022   Bilateral chronic angle-closure glaucoma, indeterminate stage 05/24/2022   Airway hyperreactivity 08/29/2013   OSA on CPAP 08/29/2013    PCP: Fredy GORMAN Bathe, MD  REFERRING PROVIDER: same  REFERRING DIAG: lymphedema I89.0  THERAPY DIAG:  Lymphedema, not elsewhere classified  Rationale for Evaluation and Treatment: Rehabilitation  ONSET DATE: Pt reports onset of L ankle / leg swelling after injury 20-30 yrs ago. Then about 1 year ago legs started swelling and Pt having prickly sensation on shins and thighs bilaterally.   SUBJECTIVE:  SUBJECTIVE STATEMENT: Cherolyn Behrle presents to OT for treatment of BLE lymphedema 2/2 suspected CVI and obesity. Pt is unaccompanied today. Pt endorses pain in L leg 3-4/10.   (INITIAL EVAL 07/12/23 : Camia Dipinto is referred to Occupational Therapy by Fredy GORMAN Bathe, MD,  for evaluation and treatment of BLE lymphedema.  Pt reports onset of L ankle and distal leg swelling about 30 years ago, then noticed RLE swelling and worsening LLE swelling  about 1 year ago at same time she started having unusual sensation in legs. Pt denies previously having lymphedema treatment. Pt reports paternal aunt had leg swelling. Pt is unable to wear compression stockings because she is unable to reach feet and distal legs to don and doff them due to back and hip pain.)  PERTINENT HISTORY: Relevant to lymphedema (OSA (denies using CPAP), BLE stasis dermatitis, HTN, DM 2 (Pt states she is prediabetic only), B glaucoma, Back and R hip OA  PAIN:  Are you having pain? Yes: NPRS scale: 3/10 Pain location: medial proximal thighs w abduction Pain description: like stickers Aggravating factors: standing, walking, dependent sitting Relieving factors: elevation  PRECAUTIONS: Fall and Other: LYMPHEDEMA precautions  (DM 2 and asthma)  WEIGHT BEARING RESTRICTIONS: No  FALLS:  Has patient fallen in last 6 months? yes Fell asleep at the computer and fell onto the floor [place] when storm door hit me  LIVING ENVIRONMENT: Lives with: alone Lives in: House/apartment Stairs: No;  Has following equipment at home: Environmental consultant - 4 wheeled  OCCUPATION: retired Scientist, research (life sciences)  LEISURE: reading and taking notes on current events and politics  HAND DOMINANCE: right   PRIOR LEVEL OF FUNCTION: Independent with household mobility with device, Requires assistive device for independence, Needs assistance with ADLs, Needs assistance with homemaking, and Needs assistance with gait  PATIENT GOALS: reduce swelling and keep it from getting worse  OBJECTIVE: Note: Objective measures were completed at Evaluation unless otherwise noted.  COGNITION:  Overall cognitive status: impaired short term  memory   OBSERVATIONS / OTHER ASSESSMENTS: Mild, Stage II, BLE Lymphedema 2/2 suspected venous insufficiency.   SENSATION: Reports uncomfortable scratching, or sticker-like  sensation on anterior thighs   POSTURE: Raised walker handles 2 notches to increase upright spinal  alignment when walking to limit falls risk. Handles need to be raised at least 1 hole more for safe upright posture when walking.  Upright sitting posture: head forward , shoulders forward and rounded- appears to be flexible kyphosis  LE ROM: WFL for ankles and knees. Pt reports limited hip abduction )  LE MMT: WFL FOR LYMPHEDEMA CARE. Presenting with generalized weakness and Pt reports debility  LYMPHEDEMA ASSESSMENTS:   SURGERY TYPE/DATE: Non-cancer related limb swelling  HX INFECTIONS: positive for 1 episode cellulitis treated w antibiotics  Hx WOUNDS: denies   BLE COMPARATIVE LIMB VOLUMETRICS: INITIAL 07/19/23  LANDMARK RIGHT (dominant)  R LEG (A-D) 3525.1 ml  R THIGH (E-G) ml  R FULL LIMB (A-G) ml  Limb Volume differential (LVD)  %  Volume change since initial %  Volume change overall V  (Blank rows = not tested)  LANDMARK LEFT    L LEG (A-D) 3769.6 ml  L THIGH (E-G) ml  L FULL LIMB (A-G) ml  Limb Volume differential (LVD)  Limb Volume Differential (LVD) measures 6.5%, L>R.  Volume change since initial %  Volume change overall %    RLE COMPARATIVE LIMB VOLUMETRICS: VISIT 9 08/15/23  LANDMARK RIGHT (dominant)  R LEG (A-D) 3549.4 ml  R  THIGH (E-G) ml  R FULL LIMB (A-G) ml  Limb Volume differential (LVD)  %  Volume change since initial R LEG volume reduced by 5.84% since initially measured on 07/18/21  Volume change overall V  (Blank rows = not tested)  Mild, Stage  II, Bilateral Lower Extremity Lymphedema 2/2 CVI and Obesity  Skin  Description Hyper-Keratosis Peau' de Orange Shiny Tight Fibrotic/ Indurated Fatty Doughy Spongy/ boggy       R>L x  x   Skin dry Flaky WNL Macerated   mildly      Color Redness Varicosities Blanching Hemosiderin Stain Mottled   x     x   Odor Malodorous Yeast Fungal infection  WNL      x   Temperature Warm Cool wnl    x     Pitting Edema   1+ 2+ 3+ 4+ Non-pitting         x   Girth Symmetrical Asymmetrical                    Distribution    R>L toes to groin    Stemmer Sign Positive Negative   +    Lymphorrhea History Of:  Present Absent     x    Wounds History Of Present Absent Venous Arterial Pressure Sheer     x        Signs of Infection Redness Warmth Erythema Acute Swelling Drainage Borders                    Sensation Light Touch Deep pressure Hypersensitivity   Present Impaired Present Impaired Absent Impaired   x Tactile  x  x     Nails WNL   Fungus nail dystrophy   x     Hair Growth Symmetrical Asymmetrical   x    Skin Creases Base of toes  Ankles   Base of Fingers knees       Abdominal pannus Thigh Lobules  Face/neck   x x  x      (Blank rows = not tested)    GAIT: Distance walked: >500' Assistive device utilized: Environmental consultant - 4 wheeled Level of assistance: Modified independence Comments: Pt bent at waist nearly 90 degrees with walker handles in lowest position  LYMPHEDEMA LIFE IMPACT SCALE (LLIS): TBA OT Rx visit 1  TREATMENT DATE:  RLE/RLQ MLD with simultaneous skin care as established RLE multilayer compression bandaging Pt edu for lymphedema self-care throughout session    PATIENT EDUCATION:  Continued Pt/ CG edu for lymphedema self care home program throughout session. Topics include outcome of comparative limb volumetrics- starting limb volume differentials (LVDs), technology and gradient techniques used for short stretch, multilayer compression wrapping, simple self-MLD, therapeutic lymphatic pumping exercises, skin/nail care, LE precautions, compression garment recommendations and specifications, wear and care schedule and compression garment donning / doffing w assistive devices. Discussed progress towards all OT goals since commencing CDT. Discussed detrimental impact of obesity on lower and upper extremity lymphedema over time. Reviewed OT goals for lymphedema care with Pt and discussed progress to date.  All questions answered to the Pt's satisfaction.  Good return. Person educated: Patient  Education method: Explanation, Demonstration, and Handouts Education comprehension: verbalized understanding, returned demonstration, verbal cues required, and needs further education   HOME EXERCISE PROGRAM: BLE lymphatic pumping there ex using- 1 sets of 10 reps, each exercise in order-  1-2 x daily, bilaterally Simple self MLD 1 x daily Daily skin care to increase  hydration, skin mobility and decrease infection risk- can be done during MLD During Intensive Phase CDT: Compression wraps 23/7 until limb volume reduction complete During Self-management Phase CDT: Fit with appropriate compression garments or alternatives. Consider BLE, knee length, Mediven, custom, CircAid , Velcro style leggings over soft cotton liners.  ASSESSMENT: CLINICAL IMPRESSION: Completed BLE anatomical measurements and determined Pt will not fit into an off the shelf, ready made size of CircAid  compression garment alternative. Instead she needs a custom garment to achieve the correct fit. No time to complete custom measurements, so we applied compression wraps to RLE as established and completed session. Will measure for RLE CircAid next session.Cont as per POC.  (INITIAL EVAL 07/12/23: Nyja Graylin Pizza is a 73 yo female presenting with chronic, progressive, BLE swelling 2/2 unknown etiology, which at first glance appears to be circulatory in nature due to skin coloring at distal legs where swelling is most invested. Pt is presently unable to wear traditional elastic compression garments due to difficulty donning and doffing them.  Chronic, progressive lymphedema with associated skin changes, including fibrosis, limits this patent's functional performance in all occupational domains, including functional ambulation and mobility, basic and instrumental ADLs (lower body dressing, LB bathing, fitting street shoes and LB clothing, driving, shopping, and home management. It also limits  perform productive activities, leisure pursuits, and participation in social and community activities. BLE lymphedema contributes to elevated infection risk. Pt will benefit from skilled OT for Complete Decongestive Therapy (CDT), which  typically includes manual lymphatic drainage (MLD), skin care to limit' infection risk and increase skin excursion, lymphatic pumping exercise, and during the Intensive Phase multilayer, gradient compression bandaging to reduce limb volume. Once limb volume is reduced as much as possible, Pt is fitted with appropriate compression garments and/ or devices and transitions into the self management phase of care consisting of follow long support PRN.    Pt understands that her fair prognosis will become poor without daily assistance with compression wrapping since she is unable to reach feet and legs to apply them herself. Pt assures OT that she has a friend she believes is willing to assist her between OT sessions. )   OBJECTIVE IMPAIRMENTS: decreased standing and walking tolerance, activity tolerance, decreased balance, decreased knowledge of condition, decreased knowledge of use of DME, decreased mobility, decreased ROM, decreased strength, increased edema, impaired flexibility, impaired sensation, impaired UE functional use, impaired vision/reception, pain, and chronic, progressive, BLE swelling and associated skin changes, at increased infection risk   ACTIVITY LIMITATIONS: Functional ambulation and mobility (lifting, carrying, steps/stairs, transfers, squatting) , Basic and instrumental ADLs, leisure pursuits, productive activities, social participation  PARTICIPATION LIMITATIONS: meal prep, cleaning, laundry, driving, shopping, yard work, and    PERSONAL FACTORS: Age, Fitness, Past/current experiences, Time since onset of injury/illness/exacerbation, 2+ co morbidities: OSA, HTN,  are also affecting patient's functional outcome.   REHAB POTENTIAL: Good  EVALUATION  COMPLEXITY: Moderate   GOALS: Goals reviewed with patient? Yes  SHORT TERM GOALS: Target date: 4th OT Rx visit   Pt will demonstrate understanding of lymphedema precautions and prevention strategies with modified independence using a printed reference to identify at least 5 precautions and discussing how s/he may implement them into daily life to reduce risk of progression with extra time. Baseline:Max A Goal status: GOAL MET  2.  Pt will be able to apply multilayer, knee length, gradient, compression wraps to one leg at a time from toes to below knee with max caregiver assist to decrease limb  volume, to limit infection risk, and to limit lymphedema progression.  Baseline: Dependent Goal status: GOAL MET  LONG TERM GOALS: Target date: 09/19/23  1.Given this patient's Intake score of TBA % on the Lymphedema Life Impact Scale (LLIS), patient will experience a reduction of at least 5 points in her perceived level of functional impairment resulting from lymphedema to improve functional performance and quality of life (QOL). Baseline: TBA % Goal status:  GOAL DEFERRED  2.  Pt will achieve at least a 10% volume reduction in B legs to return limb to typical size and shape, to limit infection risk and LE progression, to decrease pain, to improve function. Baseline: Dependent Goal status: PROGRESSING. R LEG volume reduced by 5.84% since initially measured on 07/18/21  3.  Pt will obtain appropriate compression garments/devices and achieve modified independence (extra time + assistive devices) with donning/doffing to optimize limb volume reductions and limit LE progression over time. Baseline: Dependent Goal status: PROGRESSING  During Intensive phase CDT, with max CG assistance, Pt will achieve at least 85% compliance with all adapted lymphedema self-care home program components, including daily skin care, compression wraps and /or garments, simple self MLD and lymphatic pumping therex to habituate  LE self care protocol  into ADLs for optimal LE self-management over time. Baseline: Dependent Goal status:PROGRESSING  PLAN:  OT FREQUENCY: 2 x/week  OT DURATION: 12 weeks  PLANNED INTERVENTIONS:compression bandaging, skin care,  97110-Therapeutic exercises, 97530- Therapeutic activity, 97535- Self Care, Manual lymph drainage, DME instructions, and fit with appropriate compression  PLAN FOR NEXT SESSION:  Measure R leg to assess progress towards a stocking, or some alternative Cont Pt edu for LE self-care   Zebedee Dec, MS, OTR/L, CLT-LANA 09/28/23 12:54 PM

## 2023-09-29 ENCOUNTER — Encounter: Payer: Self-pay | Admitting: Internal Medicine

## 2023-09-29 ENCOUNTER — Ambulatory Visit: Admitting: Internal Medicine

## 2023-09-29 VITALS — BP 114/62 | HR 82 | Ht 61.0 in | Wt 178.8 lb

## 2023-09-29 DIAGNOSIS — E119 Type 2 diabetes mellitus without complications: Secondary | ICD-10-CM

## 2023-09-29 DIAGNOSIS — I152 Hypertension secondary to endocrine disorders: Secondary | ICD-10-CM

## 2023-09-29 DIAGNOSIS — I890 Lymphedema, not elsewhere classified: Secondary | ICD-10-CM | POA: Diagnosis not present

## 2023-09-29 DIAGNOSIS — G8929 Other chronic pain: Secondary | ICD-10-CM | POA: Diagnosis not present

## 2023-09-29 DIAGNOSIS — I872 Venous insufficiency (chronic) (peripheral): Secondary | ICD-10-CM | POA: Diagnosis not present

## 2023-09-29 DIAGNOSIS — E1159 Type 2 diabetes mellitus with other circulatory complications: Secondary | ICD-10-CM

## 2023-09-29 DIAGNOSIS — E1169 Type 2 diabetes mellitus with other specified complication: Secondary | ICD-10-CM | POA: Diagnosis not present

## 2023-09-29 DIAGNOSIS — E782 Mixed hyperlipidemia: Secondary | ICD-10-CM | POA: Diagnosis not present

## 2023-09-29 DIAGNOSIS — M5441 Lumbago with sciatica, right side: Secondary | ICD-10-CM

## 2023-09-29 MED ORDER — VALSARTAN 40 MG PO TABS: 20.0000 mg | ORAL_TABLET | Freq: Every day | ORAL | 1 refills | Status: AC

## 2023-09-29 MED ORDER — GABAPENTIN 300 MG PO CAPS: 300.0000 mg | ORAL_CAPSULE | Freq: Every day | ORAL | 3 refills | Status: AC

## 2023-09-29 NOTE — Progress Notes (Signed)
 Established Patient Office Visit  Subjective:  Patient ID: Judith Arnold, female    DOB: [birth year]  Age: 73 y.o. MRN: [id number]  Chief Complaint  Patient presents with   Follow-up    4 month follow up    Patient comes in for follow-up today.  She has lost more weight with strict diet control.  At her last visit her blood pressure was very low so her valsartan  80 mg was stopped.  However she resumed by taking a quarter of the 80 mg tablet.  Her blood pressure today is stable.  Will switch the prescription to valsartan  40 mg that she can cut in half to get 20 mg daily.  In general she is feeling much better, and is getting regular physical therapy for lymphedema.  Her legs show an improvement.  There is very mild redness in the lower legs.  Denies chest pain or shortness of breath.    No other concerns at this time.   Past Medical History:  Diagnosis Date   Allergy    Arthritis    lower back, left shoulder   Asthma    Carpal tunnel syndrome on both sides    Dental bridge present    top   Dental crown present    multiple   Family history of adverse reaction to anesthesia    Father and sister - PONV   GERD (gastroesophageal reflux disease)    Glaucoma    Hypertension    Leaky heart valve    Pre-diabetes    Sleep apnea     Past Surgical History:  Procedure Laterality Date   CATARACT EXTRACTION W/PHACO Right 11/16/2016   Procedure: CATARACT EXTRACTION PHACO AND INTRAOCULAR LENS PLACEMENT (IOC)  right;  Surgeon: Mittie Gaskin, MD;  Location: [facility];  Service: Ophthalmology;  Laterality: Right;  pre diabetic latex sensitivity sleep apnea   CATARACT EXTRACTION W/PHACO Left 01/18/2017   Procedure: CATARACT EXTRACTION PHACO AND INTRAOCULAR LENS PLACEMENT (IOC) LEFT DIABETIC;  Surgeon: Mittie Gaskin, MD;  Location: [facility];  Service: Ophthalmology;  Laterality: Left;   COLONOSCOPY     COLONOSCOPY WITH PROPOFOL  N/A 04/09/2018   Procedure:  COLONOSCOPY WITH PROPOFOL ;  Surgeon: Viktoria Lamar DASEN, MD;  Location: [facility];  Service: Endoscopy;  Laterality: N/A;   IRIDOTOMY / IRIDECTOMY Bilateral 2005   TOOTH EXTRACTION      Social History   Socioeconomic History   Marital status: Divorced    Spouse name: Not on file   Number of children: Not on file   Years of education: Not on file   Highest education level: Not on file  Occupational History   Not on file  Tobacco Use   Smoking status: Former    Current packs/day: 0.00    Types: Cigarettes    Quit date: 71    Years since quitting: 37.5   Smokeless tobacco: Never  Vaping Use   Vaping status: Never Used  Substance and Sexual Activity   Alcohol use: Yes    Alcohol/week: 0.0 standard drinks of alcohol    Comment: 5x/yr   Drug use: No   Sexual activity: Not on file  Other Topics Concern   Not on file  Social History Narrative   Not on file   Social Drivers of Health   Financial Resource Strain: Not on file  Food Insecurity: Not on file  Transportation Needs: Not on file  Physical Activity: Not on file  Stress: Not on file  Social Connections: Not  on file  Intimate Partner Violence: Not on file    Family History  Problem Relation Age of Onset   Hypertension Mother    Diabetes Father    Breast cancer Neg Hx     Allergies  Allergen Reactions   Latex Other (See Comments)    Gets mouth rash from latex dental gloves. (Latex IgE - 08/25/16 - Negative)   Pseudoephedrine-Guaifenesin Other (See Comments)    Severe Headache   Other Rash    Walnuts cause mouth rash    Outpatient Medications Prior to Visit  Medication Sig   albuterol (VENTOLIN HFA) 108 (90 Base) MCG/ACT inhaler TAKE 2 PUFFS BY MOUTH 4 TIMES A DAY AS NEEDED   aspirin EC 81 MG tablet Take by mouth.   b complex vitamins capsule Take 1 capsule by mouth daily.   bimatoprost (LUMIGAN) 0.01 % SOLN Apply to eye.   Cetirizine HCl 10 MG CAPS Take by mouth.   clotrimazole -betamethasone   (LOTRISONE ) cream APPLY TO AFFECTED AREA TWICE A DAY   FeFum-FePoly-FA-B Cmp-C-Biot (INTEGRA PLUS ) CAPS Take 1 each by mouth daily.   Fluticasone-Salmeterol (ADVAIR DISKUS) 250-50 MCG/DOSE AEPB Inhale into the lungs.   furosemide  (LASIX ) 20 MG tablet TAKE 1 TABLET BY MOUTH EVERY DAY   Ibuprofen (ADVIL) 200 MG CAPS Take by mouth.   lansoprazole (PREVACID) 15 MG capsule TAKE 1 CAPSULE BY MOUTH TWICE DAILY 30 MINUTES BEFORE MEALS.   metFORMIN (GLUCOPHAGE-XR) 500 MG 24 hr tablet TAKE 1 TABLET BY MOUTH EVERY DAY   Polyethyl Glycol-Propyl Glycol (SYSTANE OP) Apply to eye as needed.   rosuvastatin (CRESTOR) 20 MG tablet TAKE 1 TABLET BY MOUTH EVERY DAY   SINGULAIR 10 MG tablet TAKE 1 TABLET BY MOUTH EVERY DAY   timolol  (BETIMOL ) 0.5 % ophthalmic solution Place 1 drop into both eyes 2 (two) times daily.   Triamcinolone  Acetonide (TRIAMCINOLONE  0.1 % CREAM : EUCERIN) CREA Apply 1 Application topically 2 (two) times daily.   VASCEPA 1 g capsule TAKE 2 CAPSULES BY MOUTH TWICE A DAY   [DISCONTINUED] gabapentin (NEURONTIN) 300 MG capsule TAKE 1 CAPSULE BY MOUTH EVERYDAY AT BEDTIME   Cholecalciferol (VITAMIN D3) 50000 units TABS Take by mouth once a week. (Patient not taking: Reported on 09/29/2023)   No facility-administered medications prior to visit.    Review of Systems  Constitutional:  Positive for weight loss. Negative for chills, diaphoresis, fever and malaise/fatigue.  HENT: Negative.  Negative for sore throat.   Eyes: Negative.   Respiratory: Negative.  Negative for cough and shortness of breath.   Cardiovascular: Negative.  Negative for chest pain, palpitations and leg swelling.  Gastrointestinal: Negative.  Negative for abdominal pain, constipation, diarrhea, heartburn, nausea and vomiting.  Genitourinary: Negative.  Negative for dysuria and flank pain.  Musculoskeletal: Negative.  Negative for joint pain and myalgias.  Skin:  Positive for rash.  Neurological: Negative.  Negative for  dizziness, tingling, tremors and headaches.  Endo/Heme/Allergies: Negative.   Psychiatric/Behavioral: Negative.  Negative for depression and suicidal ideas. The patient is not nervous/anxious.        Objective:   BP 114/62   Pulse 82   Ht 5' 1 (1.549 m)   Wt 178 lb 12.8 oz (81.1 kg)   SpO2 98%   BMI 33.78 kg/m   Vitals:   09/29/23 1307  BP: 114/62  Pulse: 82  Height: 5' 1 (1.549 m)  Weight: 178 lb 12.8 oz (81.1 kg)  SpO2: 98%  BMI (Calculated): 33.8    Physical Exam  Vitals and nursing note reviewed.  Constitutional:      Appearance: Normal appearance.  HENT:     Head: Normocephalic and atraumatic.     Nose: Nose normal.     Mouth/Throat:     Mouth: Mucous membranes are moist.     Pharynx: Oropharynx is clear.  Eyes:     Conjunctiva/sclera: Conjunctivae normal.     Pupils: Pupils are equal, round, and reactive to light.  Cardiovascular:     Rate and Rhythm: Normal rate and regular rhythm.     Pulses: Normal pulses.     Heart sounds: Normal heart sounds. No murmur heard. Pulmonary:     Effort: Pulmonary effort is normal.     Breath sounds: Normal breath sounds. No wheezing.  Abdominal:     General: Bowel sounds are normal.     Palpations: Abdomen is soft.     Tenderness: There is no abdominal tenderness. There is no right CVA tenderness or left CVA tenderness.  Musculoskeletal:        General: Normal range of motion.     Cervical back: Normal range of motion.     Right lower leg: Edema present.     Left lower leg: Edema present.  Skin:    General: Skin is warm and dry.  Neurological:     General: No focal deficit present.     Mental Status: She is alert and oriented to person, place, and time.  Psychiatric:        Mood and Affect: Mood normal.        Behavior: Behavior normal.      No results found for any visits on 09/29/23.  Recent Results (from the past 2160 hours)  CMP14+EGFR     Status: Abnormal   Collection Time: 08/24/23  2:15 PM  Result  Value Ref Range   Glucose 90 70 - 99 mg/dL   BUN 14 8 - 27 mg/dL   Creatinine, Ser 8.88 (H) 0.57 - 1.00 mg/dL   eGFR 52 (L) >40 fO/fpw/8.26   BUN/Creatinine Ratio 13 12 - 28   Sodium 140 134 - 144 mmol/L   Potassium 4.0 3.5 - 5.2 mmol/L   Chloride 99 96 - 106 mmol/L   CO2 23 20 - 29 mmol/L   Calcium 9.5 8.7 - 10.3 mg/dL   Total Protein 6.4 6.0 - 8.5 g/dL   Albumin 4.3 3.8 - 4.8 g/dL   Globulin, Total 2.1 1.5 - 4.5 g/dL   Bilirubin Total 0.4 0.0 - 1.2 mg/dL   Alkaline Phosphatase 77 44 - 121 IU/L   AST 24 0 - 40 IU/L   ALT 13 0 - 32 IU/L  Lipid Profile     Status: None   Collection Time: 08/24/23  2:15 PM  Result Value Ref Range   Cholesterol, Total 154 100 - 199 mg/dL   Triglycerides 74 0 - 149 mg/dL   HDL 66 >60 mg/dL   VLDL Cholesterol Cal 14 5 - 40 mg/dL   LDL Chol Calc (NIH) 74 0 - 99 mg/dL   Chol/HDL Ratio 2.3 0.0 - 4.4 ratio    Comment:                                   T. Chol/HDL Ratio  Men  Women                               1/2 Avg.Risk  3.4    3.3                                   Avg.Risk  5.0    4.4                                2X Avg.Risk  9.6    7.1                                3X Avg.Risk 23.4   11.0       Assessment & Plan:  Continue current medications.  Monitor blood pressure and blood sugar.  Will check labs at next visit and adjust medications further. Problem List Items Addressed This Visit     Type 2 diabetes mellitus without complication, without long-term current use of insulin (HCC)   Relevant Medications   valsartan  (DIOVAN ) 40 MG tablet   Stasis dermatitis of both legs   Relevant Medications   valsartan  (DIOVAN ) 40 MG tablet   Lymphedema of lower extremity   Hypertension associated with diabetes (HCC) - Primary   Relevant Medications   valsartan  (DIOVAN ) 40 MG tablet   Combined hyperlipidemia associated with type 2 diabetes mellitus (HCC)   Relevant Medications   valsartan  (DIOVAN ) 40 MG  tablet   Other Visit Diagnoses       Type 2 diabetes mellitus without complications (HCC)       Relevant Medications   gabapentin (NEURONTIN) 300 MG capsule   valsartan  (DIOVAN ) 40 MG tablet       Return in about 3 months (around 12/30/2023).   Total time spent: 30 minutes  FERNAND FREDY RAMAN, MD  09/29/2023   This document may have been prepared by [facility] Voice Recognition software and as such may include unintentional dictation errors.

## 2023-10-03 ENCOUNTER — Ambulatory Visit: Admitting: Occupational Therapy

## 2023-10-03 ENCOUNTER — Ambulatory Visit

## 2023-10-03 DIAGNOSIS — R278 Other lack of coordination: Secondary | ICD-10-CM | POA: Diagnosis not present

## 2023-10-03 DIAGNOSIS — I890 Lymphedema, not elsewhere classified: Secondary | ICD-10-CM | POA: Diagnosis not present

## 2023-10-03 DIAGNOSIS — M5459 Other low back pain: Secondary | ICD-10-CM | POA: Diagnosis not present

## 2023-10-03 DIAGNOSIS — R262 Difficulty in walking, not elsewhere classified: Secondary | ICD-10-CM

## 2023-10-03 DIAGNOSIS — M25551 Pain in right hip: Secondary | ICD-10-CM | POA: Diagnosis not present

## 2023-10-03 DIAGNOSIS — M6281 Muscle weakness (generalized): Secondary | ICD-10-CM | POA: Diagnosis not present

## 2023-10-03 NOTE — Therapy (Signed)
 OUTPATIENT PHYSICAL THERAPY THORACOLUMBAR TREATMENT   Patient Name: Judith Arnold MRN: [id number] DOB:[birth year], 73 y.o., female Today's Date: 10/04/2023  END OF SESSION:  PT End of Session - 10/03/23 1618     Visit Number 3    Number of Visits 25    Date for PT Re-Evaluation 12/12/23    PT Start Time 1615    PT Stop Time 1702    PT Time Calculation (min) 47 min    Equipment Utilized During Treatment Gait belt    Activity Tolerance Patient tolerated treatment well;Patient limited by pain    Behavior During Therapy WFL for tasks assessed/performed          Past Medical History:  Diagnosis Date   Allergy    Arthritis    lower back, left shoulder   Asthma    Carpal tunnel syndrome on both sides    Dental bridge present    top   Dental crown present    multiple   Family history of adverse reaction to anesthesia    Father and sister - PONV   GERD (gastroesophageal reflux disease)    Glaucoma    Hypertension    Leaky heart valve    Pre-diabetes    Sleep apnea    Past Surgical History:  Procedure Laterality Date   CATARACT EXTRACTION W/PHACO Right 11/16/2016   Procedure: CATARACT EXTRACTION PHACO AND INTRAOCULAR LENS PLACEMENT (IOC)  right;  Surgeon: Mittie Gaskin, MD;  Location: [facility];  Service: Ophthalmology;  Laterality: Right;  pre diabetic latex sensitivity sleep apnea   CATARACT EXTRACTION W/PHACO Left 01/18/2017   Procedure: CATARACT EXTRACTION PHACO AND INTRAOCULAR LENS PLACEMENT (IOC) LEFT DIABETIC;  Surgeon: Mittie Gaskin, MD;  Location: [facility];  Service: Ophthalmology;  Laterality: Left;   COLONOSCOPY     COLONOSCOPY WITH PROPOFOL  N/A 04/09/2018   Procedure: COLONOSCOPY WITH PROPOFOL ;  Surgeon: Viktoria Lamar DASEN, MD;  Location: [facility];  Service: Endoscopy;  Laterality: N/A;   IRIDOTOMY / IRIDECTOMY Bilateral 2005   TOOTH EXTRACTION     Patient Active Problem List   Diagnosis Date Noted   Chronic  bilateral low back pain with right-sided sciatica 09/29/2023   Hypertension associated with diabetes (HCC) 01/20/2023   Combined hyperlipidemia associated with type 2 diabetes mellitus (HCC) 01/20/2023   Intertrigo 11/01/2022   Stasis dermatitis of both legs 10/21/2022   Leg swelling 10/21/2022   Lymphedema of lower extremity 10/21/2022   Type 2 diabetes mellitus without complication, without long-term current use of insulin (HCC) 05/24/2022   Mixed hyperlipidemia 05/24/2022   Essential hypertension, benign 05/24/2022   Absolute anemia 05/24/2022   Gastroesophageal reflux disease without esophagitis 05/24/2022   Allergic rhinitis 05/24/2022   Asthma, allergic, moderate persistent, uncomplicated 05/24/2022   Bilateral chronic angle-closure glaucoma, indeterminate stage 05/24/2022   Airway hyperreactivity 08/29/2013   OSA on CPAP 08/29/2013    PCP: Fernand Fredy RAMAN, MD  REFERRING PROVIDER: Fernand Fredy, RAMAN, MD  REFERRING DIAG:  Diagnosis  G89.29,M54.41 (ICD-10-CM) - Chronic bilateral low back pain with right-sided sciatica    Rationale for Evaluation and Treatment: Rehabilitation  THERAPY DIAG:  Other low back pain  Pain in right hip  Muscle weakness (generalized)  Difficulty in walking, not elsewhere classified  Other lack of coordination  ONSET DATE: >3 years   SUBJECTIVE:  SUBJECTIVE STATEMENT:  Today: Patient reports having a long rough day and back and hips bothering her.     From EVAL: Pt is a pleasant 73 y/o female presenting to PT eval for chronic LBP with R side hip pain. She reports onset of LBP over 3 years ago. Primary site of pain is mostly felt in R hip and lower spine. Pt reports hx of arthritis in lower back and R hip. She is now starting to feel arthritis pain in her  knees as well. Reports RLE is actually her good leg, has some issues with pain into groin region of LLE. Pt states strength in legs has been impacted and that her general mobility is worse. Pt can only ambulate comfortably by taking weight off of lower back by using her 4WW. She states not using 4WW so much for balance, but for reducing pain with gait. She is unable to stand up fully due to pain. Pt says she used to be very fit (took take karate, gymnastics years ago). She is also seeing OT for BLE lymphedema management. She endorses difficulty getting out of chairs, difficulty with steps, she must use a ramp to get in and out of her home. Pt reports my brain does not retain what I'm talking about, can lose train of thought.  PERTINENT HISTORY:  PMH per chart includes arthritis low back and left shoulder, bilateral carpal tunnel, asthma, glaucoma, HTN, leaky heart valve, sleep apnea, pre-diabetes, being seen by lymphedema specialist for BLE lymphedema   PAIN:  Are you having pain? LBP and R hip pain  Lowest pain level: 0/10  With standing: 2/10  Worst: 5-6/10   PRECAUTIONS: LATEX ALLERGY per chart, fall  RED FLAGS: None   WEIGHT BEARING RESTRICTIONS: No  FALLS:  Has patient fallen in last 6 months? No  LIVING ENVIRONMENT: Uses ramp to get into home due to difficulty with steps, has 4WW  PLOF: Independent  PATIENT GOALS:  Says due to back pain she is unable to reach my feet, has difficulty getting her socks on without a grabber, wants to be more flexible and stronger to increase ease with these activities and mobility    OBJECTIVE:  Note: Objective measures were completed at Evaluation unless otherwise noted.  DIAGNOSTIC FINDINGS:  No recent pertinent imaging available in chart  PATIENT SURVEYS:  Modified Oswestry score: 34%  Interpretation of scores: Score Category Description  0-20% Minimal Disability The patient can cope with most living activities. Usually no  treatment is indicated apart from advice on lifting, sitting and exercise  21-40% Moderate Disability The patient experiences more pain and difficulty with sitting, lifting and standing. Travel and social life are more difficult and they may be disabled from work. Personal care, sexual activity and sleeping are not grossly affected, and the patient can usually be managed by conservative means  41-60% Severe Disability Pain remains the main problem in this group, but activities of daily living are affected. These patients require a detailed investigation  61-80% Crippled Back pain impinges on all aspects of the patient's life. Positive intervention is required  81-100% Bed-bound  These patients are either bed-bound or exaggerating their symptoms  Bluford FORBES Zoe DELENA Karon DELENA, et al. Surgery versus conservative management of stable thoracolumbar fracture: the PRESTO feasibility RCT. [place]): [employer]; 2021 Nov. [facility] Technology Assessment, No. 25.62.) Appendix 3, Oswestry Disability Index category descriptors. Available from: FindJewelers.cz  Minimally Clinically Important Difference (MCID) = 12.8%  COGNITION: Overall cognitive status: Within  functional limits for tasks assessed, very pleasant pt can become tangential and requires redirection to activity    SENSATION: Intact to light touch BLE   MUSCLE LENGTH: deferred  POSTURE: increased thoracic kyphosis, rounded shoulders, unable to achieve full upright position due to pain, maintains increased hip and knee flexion when attempting to stand fully upright   PALPATION: deferred  Thoracolumbar AROM: Extension - unable to achieve neutral position, remains in significant flexion  Flexion - lacking at least 25%  Rotation - lacking at least 40% bilat  and pain-limited    LOWER EXTREMITY MMT:    MMT Right eval Left eval  Hip flexion 4- 3*  Hip extension    Hip abduction 4+ 4+  Hip  adduction 4+ 4+  Hip internal rotation    Hip external rotation    Knee flexion 4 3*  Knee extension 4+ 4+  Ankle dorsiflexion 4 4+  Ankle plantarflexion    Ankle inversion    Ankle eversion     (Blank rows = not tested) *=pain limited   LUMBAR SPECIAL TESTS:  deferred  FUNCTIONAL TESTS:  10 meter walk test: 0.63 m/s crouched posture, decreased hip extension, decreased step-length and heel strike, elevated R>L shoulder, heavy BUE weightbearing on 4WW  GAIT: Distance walked: 10MWT/clinic distances (see above) Assistive device utilized: Environmental consultant - 4 wheeled Level of assistance: Modified independence Comments: gait mechanics impaired: decreased gait speed, crouched posture, decreased hip extension, decreased step-length and heel strike, elevated R>L shoulder, heavy BUE weightbearing on 4WW  TREATMENT DATE: 09/26/2023   THEREX:  Instructed patient in some Low back/LE stretching: -SKC- (increased pain in position with LLE despite multiple variations and attempts- ceased but able to perform with RLE without complaint of pain) - hold 30 sec x 3. -Lower trunk rotation- Hold 30 sec x 4 ea side -Butterfly (groin) stretch- hold 30 sec x 4 -B Hamstring stretch (neural stretch) hold 30 sec x 4  Initiation of core strengthening:  Transverse abdominal - hold 5 sec x 12 reps (VC for technique)  Bridging x 15 reps (patient able to minimally clear her bottom)  Sidelye clamshell 2 x 10 reps   Self care/Home management: Discussed strategies in how to perform above into HEP- Reviewed with video using medbridge - see below for HEP                                                                                                                  PATIENT EDUCATION:  Education details: Provided education on findings of exam, indications for plan, prognoses, goals, how PT can help                 Person educated: Patient Education method: Explanation Education comprehension: verbalized  understanding  HOME EXERCISE PROGRAM: Access Code: [email or web address] URL: [email or web address] Date: 10/03/2023 Prepared by: Reyes London  Exercises - Supine Bridge  - 3 x weekly - 3 sets - 10 reps - Clamshell  - 3 x weekly - 3 sets - 10 reps - Supine Lower  Trunk Rotation  - 1 x daily - 3 sets - 30 sec hold - Supine Hamstring Stretch with Strap  - 1 x daily - 3 sets - 30 sec hold - Seated Hamstring Stretch  - 1 x daily - 3 sets - 30 hold - Supine Butterfly Groin Stretch  - 1 x daily - 3 sets - 30 sec hold - Supine Transversus Abdominis Bracing - Hands on Stomach  - 3 x weekly - 3 sets - 10 reps - Supine Gluteal Sets  - 3 x weekly - 3 sets - 10 reps  ASSESSMENT:  CLINICAL IMPRESSION: Patient is a pleasant 73 y.o. female who was seen today for physical therapy treatment for chronic LBP with R side sciatica. Treatment focused on educating patient in some beginner low back stretching along with some beginner core. She did well overall except increased pain with attempting single knee to chest- had to cease due to pain. Good ability to contract TrA with some direction today. The pt will benefit from further skilled PT to address strength, gait, mobility, balance and pain to increase ease and safety with ADLS and QOL.  OBJECTIVE IMPAIRMENTS: Abnormal gait, decreased activity tolerance, decreased balance, decreased mobility, difficulty walking, decreased strength, hypomobility, increased edema, improper body mechanics, postural dysfunction, and pain.   ACTIVITY LIMITATIONS: carrying, lifting, bending, squatting, stairs, transfers, bed mobility, and locomotion level  PARTICIPATION LIMITATIONS: meal prep, cleaning, shopping, community activity, and yard work  PERSONAL FACTORS: Age, Fitness, Sex, Time since onset of injury/illness/exacerbation, and 3+ comorbidities: PMH per chart includes arthritis low back and left shoulder, bilateral carpal tunnel, asthma, glaucoma, HTN, leaky heart  valve, sleep apnea, pre-diabetes, being seen by lymphedema specialist for BLE lymphedema  are also affecting patient's functional outcome.   REHAB POTENTIAL: Good  CLINICAL DECISION MAKING: Evolving/moderate complexity  EVALUATION COMPLEXITY: Moderate   GOALS: Goals reviewed with patient? Yes   SHORT TERM GOALS: Target date: 10/31/2023    Patient will be independent in home exercise program to improve strength/mobility for better functional independence with ADLs. Baseline:to be initiated  Goal status: INITIAL   LONG TERM GOALS: Target date: 12/12/2023    Patient will reduce modified Oswestry score to <20 as to demonstrate minimal disability with ADLs including improved sleeping tolerance, walking/sitting tolerance etc for better mobility with ADLs.  Baseline: 34 Goal status: INITIAL  2.  Patient (> 58 years old) will complete five times sit to stand test in < 15 seconds indicating an increased LE strength and improved balance. Baseline: 09/26/2023= 26.46 sec with heavy UE Support Goal status: INITIAL  3.  Patient will increase Berg Balance score by > 6 points to demonstrate decreased fall risk during functional activities Baseline: 09/26/2023= 31/56 Goal status: INITIAL  4.  Patient will increase 10 meter walk test to >1.67m/s as to improve gait speed for better community ambulation and to reduce fall risk. Baseline:  0.63 m/s with 4WW Goal status: INITIAL      PLAN:  PT FREQUENCY: 1-2x/week  PT DURATION: 12 weeks  PLANNED INTERVENTIONS: [id number]- PT Re-evaluation, 97750- Physical Performance Testing, 97110-Therapeutic exercises, 97530- Therapeutic activity, [id number]- Neuromuscular re-education, 97535- Self Care, [id number]- Manual therapy, [id number]- Gait training, [phone]- Orthotic Initial, [phone]- Orthotic/Prosthetic subsequent, [phone]- Canalith repositioning, Patient/Family education, Balance training, Stair training, Taping, Joint mobilization, Spinal mobilization, Vestibular  training, DME instructions, Cryotherapy, and Moist heat.  PLAN FOR NEXT SESSION:  Continue to progress HEP Manual therapy for lumbar ROM/pain Therex for ROM, Low back/LE/Core strengthening   Reyes LOISE London,  PT 10/04/2023, 2:00 PM

## 2023-10-03 NOTE — Therapy (Unsigned)
 OUTPATIENT OCCUPATIONAL THERAPY TREATMENT NOTE  BILATERAL LOWER EXTREMITY LYMPHEDEMA  Patient Name: Judith Arnold MRN: [id number] DOB:18-May-1950, 73 y.o., female Today's Date: 10/03/2023  REPORTING PERIOD:    END OF SESSION:   OT End of Session - 10/03/23 1510     Visit Number 19    Number of Visits 36    Date for OT Re-Evaluation 10/10/23    OT Start Time 0305    OT Stop Time 0412    OT Time Calculation (min) 67 min    Equipment Utilized During Treatment sock donner; compression garment samples    Activity Tolerance Patient tolerated treatment well;No increased pain    Behavior During Therapy WFL for tasks assessed/performed           Past Medical History:  Diagnosis Date   Allergy    Arthritis    lower back, left shoulder   Asthma    Carpal tunnel syndrome on both sides    Dental bridge present    top   Dental crown present    multiple   Family history of adverse reaction to anesthesia    Father and sister - PONV   GERD (gastroesophageal reflux disease)    Glaucoma    Hypertension    Leaky heart valve    Pre-diabetes    Sleep apnea    Past Surgical History:  Procedure Laterality Date   CATARACT EXTRACTION W/PHACO Right 11/16/2016   Procedure: CATARACT EXTRACTION PHACO AND INTRAOCULAR LENS PLACEMENT (IOC)  right;  Surgeon: Mittie Gaskin, MD;  Location: [facility];  Service: Ophthalmology;  Laterality: Right;  pre diabetic latex sensitivity sleep apnea   CATARACT EXTRACTION W/PHACO Left 01/18/2017   Procedure: CATARACT EXTRACTION PHACO AND INTRAOCULAR LENS PLACEMENT (IOC) LEFT DIABETIC;  Surgeon: Mittie Gaskin, MD;  Location: [facility];  Service: Ophthalmology;  Laterality: Left;   COLONOSCOPY     COLONOSCOPY WITH PROPOFOL  N/A 04/09/2018   Procedure: COLONOSCOPY WITH PROPOFOL ;  Surgeon: Viktoria Lamar DASEN, MD;  Location: [facility];  Service: Endoscopy;  Laterality: N/A;   IRIDOTOMY / IRIDECTOMY Bilateral 2005   TOOTH  EXTRACTION     Patient Active Problem List   Diagnosis Date Noted   Chronic bilateral low back pain with right-sided sciatica 09/29/2023   Hypertension associated with diabetes (HCC) 01/20/2023   Combined hyperlipidemia associated with type 2 diabetes mellitus (HCC) 01/20/2023   Intertrigo 11/01/2022   Stasis dermatitis of both legs 10/21/2022   Leg swelling 10/21/2022   Lymphedema of lower extremity 10/21/2022   Type 2 diabetes mellitus without complication, without long-term current use of insulin (HCC) 05/24/2022   Mixed hyperlipidemia 05/24/2022   Essential hypertension, benign 05/24/2022   Absolute anemia 05/24/2022   Gastroesophageal reflux disease without esophagitis 05/24/2022   Allergic rhinitis 05/24/2022   Asthma, allergic, moderate persistent, uncomplicated 05/24/2022   Bilateral chronic angle-closure glaucoma, indeterminate stage 05/24/2022   Airway hyperreactivity 08/29/2013   OSA on CPAP 08/29/2013    PCP: Fredy GORMAN Bathe, MD  REFERRING PROVIDER: same  REFERRING DIAG: lymphedema I89.0  THERAPY DIAG:  Lymphedema, not elsewhere classified  Rationale for Evaluation and Treatment: Rehabilitation  ONSET DATE: Pt reports onset of L ankle / leg swelling after injury 20-30 yrs ago. Then about 1 year ago legs started swelling and Pt having prickly sensation on shins and thighs bilaterally.   SUBJECTIVE:  SUBJECTIVE STATEMENT: [name] presents to OT for treatment of BLE lymphedema 2/2 suspected CVI and obesity. Pt is unaccompanied today. Pt endorses pain in L leg 3-4/10.   (INITIAL EVAL 07/12/23 : [name] is referred to Occupational Therapy by Fredy GORMAN Bathe, MD,  for evaluation and treatment of BLE lymphedema.  Pt reports onset of L ankle and distal leg swelling  about 30 years ago, then noticed RLE swelling and worsening LLE swelling about 1 year ago at same time she started having unusual sensation in legs. Pt denies previously having lymphedema treatment. Pt reports paternal aunt had leg swelling. Pt is unable to wear compression stockings because she is unable to reach feet and distal legs to don and doff them due to back and hip pain.)  PERTINENT HISTORY: Relevant to lymphedema (OSA (denies using CPAP), BLE stasis dermatitis, HTN, DM 2 (Pt states she is prediabetic only), B glaucoma, Back and R hip OA  PAIN:  Are you having pain? Yes: NPRS scale: 3/10 Pain location: medial proximal thighs w abduction Pain description: like stickers Aggravating factors: standing, walking, dependent sitting Relieving factors: elevation  PRECAUTIONS: Fall and Other: LYMPHEDEMA precautions  (DM 2 and asthma)  WEIGHT BEARING RESTRICTIONS: No  FALLS:  Has patient fallen in last 6 months? yes Fell asleep at the computer and fell onto the floor [place] when storm door hit me  LIVING ENVIRONMENT: Lives with: alone Lives in: House/apartment Stairs: No;  Has following equipment at home: Environmental consultant - 4 wheeled  OCCUPATION: retired Scientist, research (life sciences)  LEISURE: reading and taking notes on current events and politics  HAND DOMINANCE: right   PRIOR LEVEL OF FUNCTION: Independent with household mobility with device, Requires assistive device for independence, Needs assistance with ADLs, Needs assistance with homemaking, and Needs assistance with gait  PATIENT GOALS: reduce swelling and keep it from getting worse  OBJECTIVE: Note: Objective measures were completed at Evaluation unless otherwise noted.  COGNITION:  Overall cognitive status: impaired short term  memory   OBSERVATIONS / OTHER ASSESSMENTS: Mild, Stage II, BLE Lymphedema 2/2 suspected venous insufficiency.   SENSATION: Reports uncomfortable scratching, or sticker-like  sensation on anterior thighs    POSTURE: Raised walker handles 2 notches to increase upright spinal alignment when walking to limit falls risk. Handles need to be raised at least 1 hole more for safe upright posture when walking.  Upright sitting posture: head forward , shoulders forward and rounded- appears to be flexible kyphosis  LE ROM: WFL for ankles and knees. Pt reports limited hip abduction )  LE MMT: WFL FOR LYMPHEDEMA CARE. Presenting with generalized weakness and Pt reports debility  LYMPHEDEMA ASSESSMENTS:   SURGERY TYPE/DATE: Non-cancer related limb swelling  HX INFECTIONS: positive for 1 episode cellulitis treated w antibiotics  Hx WOUNDS: denies   BLE COMPARATIVE LIMB VOLUMETRICS: INITIAL 07/19/23  LANDMARK RIGHT (dominant)  R LEG (A-D) 3525.1 ml  R THIGH (E-G) ml  R FULL LIMB (A-G) ml  Limb Volume differential (LVD)  %  Volume change since initial %  Volume change overall V  (Blank rows = not tested)  LANDMARK LEFT    L LEG (A-D) 3769.6 ml  L THIGH (E-G) ml  L FULL LIMB (A-G) ml  Limb Volume differential (LVD)  Limb Volume Differential (LVD) measures 6.5%, L>R.  Volume change since initial %  Volume change overall %    RLE COMPARATIVE LIMB VOLUMETRICS: VISIT 9 08/15/23  LANDMARK RIGHT (dominant)  R LEG (A-D) 3549.4 ml  R  THIGH (E-G) ml  R FULL LIMB (A-G) ml  Limb Volume differential (LVD)  %  Volume change since initial R LEG volume reduced by 5.84% since initially measured on 07/18/21  Volume change overall V  (Blank rows = not tested)  RLE COMPARATIVE LIMB VOLUMETRICS: VISIT 19 10/03/23  LANDMARK RIGHT (dominant)  R LEG (A-D) 3549.4 ml  R THIGH (E-G) ml  R FULL LIMB (A-G) ml  Limb Volume differential (LVD)  %  Volume change since initial R LEG volume reduced by 14.7% since initially measured on 07/18/21  Volume change overall V  (Blank rows = not tested)  Mild, Stage  II, Bilateral Lower Extremity Lymphedema 2/2 CVI and Obesity  Skin  Description Hyper-Keratosis Peau' de  Orange Shiny Tight Fibrotic/ Indurated Fatty Doughy Spongy/ boggy       R>L x  x   Skin dry Flaky WNL Macerated   mildly      Color Redness Varicosities Blanching Hemosiderin Stain Mottled   x     x   Odor Malodorous Yeast Fungal infection  WNL      x   Temperature Warm Cool wnl    x     Pitting Edema   1+ 2+ 3+ 4+ Non-pitting         x   Girth Symmetrical Asymmetrical                   Distribution    R>L toes to groin    Stemmer Sign Positive Negative   +    Lymphorrhea History Of:  Present Absent     x    Wounds History Of Present Absent Venous Arterial Pressure Sheer     x        Signs of Infection Redness Warmth Erythema Acute Swelling Drainage Borders                    Sensation Light Touch Deep pressure Hypersensitivity   Present Impaired Present Impaired Absent Impaired   x Tactile  x  x     Nails WNL   Fungus nail dystrophy   x     Hair Growth Symmetrical Asymmetrical   x    Skin Creases Base of toes  Ankles   Base of Fingers knees       Abdominal pannus Thigh Lobules  Face/neck   x x  x      (Blank rows = not tested)    GAIT: Distance walked: >500' Assistive device utilized: Environmental consultant - 4 wheeled Level of assistance: Modified independence Comments: Pt bent at waist nearly 90 degrees with walker handles in lowest position  LYMPHEDEMA LIFE IMPACT SCALE (LLIS): TBA OT Rx visit 1  TREATMENT DATE:  RLE/RLQ MLD with simultaneous skin care as established RLE multilayer compression bandaging Pt edu for lymphedema self-care throughout session    PATIENT EDUCATION:  Continued Pt/ CG edu for lymphedema self care home program throughout session. Topics include outcome of comparative limb volumetrics- starting limb volume differentials (LVDs), technology and gradient techniques used for short stretch, multilayer compression wrapping, simple self-MLD, therapeutic lymphatic pumping exercises, skin/nail care, LE precautions, compression garment  recommendations and specifications, wear and care schedule and compression garment donning / doffing w assistive devices. Discussed progress towards all OT goals since commencing CDT. Discussed detrimental impact of obesity on lower and upper extremity lymphedema over time. Reviewed OT goals for lymphedema care with Pt and discussed progress to date.  All questions answered to the  Pt's satisfaction. Good return. Person educated: Patient  Education method: Explanation, Demonstration, and Handouts Education comprehension: verbalized understanding, returned demonstration, verbal cues required, and needs further education   HOME EXERCISE PROGRAM: BLE lymphatic pumping there ex using- 1 sets of 10 reps, each exercise in order-  1-2 x daily, bilaterally Simple self MLD 1 x daily Daily skin care to increase hydration, skin mobility and decrease infection risk- can be done during MLD During Intensive Phase CDT: Compression wraps 23/7 until limb volume reduction complete During Self-management Phase CDT: Fit with appropriate compression garments or alternatives. Consider BLE, knee length, Mediven, custom, CircAid , Velcro style leggings over soft cotton liners.  ASSESSMENT: CLINICAL IMPRESSION: Completed BLE anatomical measurements and determined Pt will not fit into an off the shelf, ready made size of CircAid  compression garment alternative. Instead she needs a custom garment to achieve the correct fit. No time to complete custom measurements, so we applied compression wraps to RLE as established and completed session. Will measure for RLE CircAid next session.Cont as per POC.  (INITIAL EVAL 07/12/23: Delight Graylin Pizza is a 73 yo female presenting with chronic, progressive, BLE swelling 2/2 unknown etiology, which at first glance appears to be circulatory in nature due to skin coloring at distal legs where swelling is most invested. Pt is presently unable to wear traditional elastic compression  garments due to difficulty donning and doffing them.  Chronic, progressive lymphedema with associated skin changes, including fibrosis, limits this patent's functional performance in all occupational domains, including functional ambulation and mobility, basic and instrumental ADLs (lower body dressing, LB bathing, fitting street shoes and LB clothing, driving, shopping, and home management. It also limits perform productive activities, leisure pursuits, and participation in social and community activities. BLE lymphedema contributes to elevated infection risk. Pt will benefit from skilled OT for Complete Decongestive Therapy (CDT), which  typically includes manual lymphatic drainage (MLD), skin care to limit' infection risk and increase skin excursion, lymphatic pumping exercise, and during the Intensive Phase multilayer, gradient compression bandaging to reduce limb volume. Once limb volume is reduced as much as possible, Pt is fitted with appropriate compression garments and/ or devices and transitions into the self management phase of care consisting of follow long support PRN.    Pt understands that her fair prognosis will become poor without daily assistance with compression wrapping since she is unable to reach feet and legs to apply them herself. Pt assures OT that she has a friend she believes is willing to assist her between OT sessions. )   OBJECTIVE IMPAIRMENTS: decreased standing and walking tolerance, activity tolerance, decreased balance, decreased knowledge of condition, decreased knowledge of use of DME, decreased mobility, decreased ROM, decreased strength, increased edema, impaired flexibility, impaired sensation, impaired UE functional use, impaired vision/reception, pain, and chronic, progressive, BLE swelling and associated skin changes, at increased infection risk   ACTIVITY LIMITATIONS: Functional ambulation and mobility (lifting, carrying, steps/stairs, transfers, squatting) , Basic  and instrumental ADLs, leisure pursuits, productive activities, social participation  PARTICIPATION LIMITATIONS: meal prep, cleaning, laundry, driving, shopping, yard work, and    PERSONAL FACTORS: Age, Fitness, Past/current experiences, Time since onset of injury/illness/exacerbation, 2+ co morbidities: OSA, HTN,  are also affecting patient's functional outcome.   REHAB POTENTIAL: Good  EVALUATION COMPLEXITY: Moderate   GOALS: Goals reviewed with patient? Yes  SHORT TERM GOALS: Target date: 4th OT Rx visit   Pt will demonstrate understanding of lymphedema precautions and prevention strategies with modified independence using a printed reference  to identify at least 5 precautions and discussing how s/he may implement them into daily life to reduce risk of progression with extra time. Baseline:Max A Goal status: GOAL MET  2.  Pt will be able to apply multilayer, knee length, gradient, compression wraps to one leg at a time from toes to below knee with max caregiver assist to decrease limb volume, to limit infection risk, and to limit lymphedema progression.  Baseline: Dependent Goal status: GOAL MET  LONG TERM GOALS: Target date: 09/19/23  1.Given this patient's Intake score of TBA % on the Lymphedema Life Impact Scale (LLIS), patient will experience a reduction of at least 5 points in her perceived level of functional impairment resulting from lymphedema to improve functional performance and quality of life (QOL). Baseline: TBA % Goal status:  GOAL DEFERRED  2.  Pt will achieve at least a 10% volume reduction in B legs to return limb to typical size and shape, to limit infection risk and LE progression, to decrease pain, to improve function. Baseline: Dependent Goal status: PROGRESSING. R LEG volume reduced by 14.7% since initially measured on 07/18/21  3.  Pt will obtain appropriate compression garments/devices and achieve modified independence (extra time + assistive devices) with  donning/doffing to optimize limb volume reductions and limit LE progression over time. Baseline: Dependent Goal status: PROGRESSING  During Intensive phase CDT, with max CG assistance, Pt will achieve at least 85% compliance with all adapted lymphedema self-care home program components, including daily skin care, compression wraps and /or garments, simple self MLD and lymphatic pumping therex to habituate LE self care protocol  into ADLs for optimal LE self-management over time. Baseline: Dependent Goal status:PROGRESSING  PLAN:  OT FREQUENCY: 2 x/week  OT DURATION: 12 weeks  PLANNED INTERVENTIONS:compression bandaging, skin care,  97110-Therapeutic exercises, 97530- Therapeutic activity, 97535- Self Care, Manual lymph drainage, DME instructions, and fit with appropriate compression  PLAN FOR NEXT SESSION:  Measure R leg to assess progress towards a stocking, or some alternative Cont Pt edu for LE self-care   Zebedee Dec, MS, OTR/L, CLT-LANA 10/03/23 4:14 PM

## 2023-10-05 ENCOUNTER — Encounter: Payer: Self-pay | Admitting: Occupational Therapy

## 2023-10-05 ENCOUNTER — Ambulatory Visit: Admitting: Occupational Therapy

## 2023-10-05 ENCOUNTER — Ambulatory Visit: Admitting: Physical Therapy

## 2023-10-05 DIAGNOSIS — I890 Lymphedema, not elsewhere classified: Secondary | ICD-10-CM

## 2023-10-05 DIAGNOSIS — R262 Difficulty in walking, not elsewhere classified: Secondary | ICD-10-CM | POA: Diagnosis not present

## 2023-10-05 DIAGNOSIS — M6281 Muscle weakness (generalized): Secondary | ICD-10-CM

## 2023-10-05 DIAGNOSIS — M25551 Pain in right hip: Secondary | ICD-10-CM

## 2023-10-05 DIAGNOSIS — M5459 Other low back pain: Secondary | ICD-10-CM

## 2023-10-05 DIAGNOSIS — R278 Other lack of coordination: Secondary | ICD-10-CM | POA: Diagnosis not present

## 2023-10-05 NOTE — Therapy (Signed)
 OUTPATIENT PHYSICAL THERAPY THORACOLUMBAR TREATMENT   Patient Name: Judith Arnold MRN: [id number] DOB:03-28-1950, 73 y.o., female Today's Date: 10/05/2023  END OF SESSION:  PT End of Session - 10/05/23 1459     Visit Number 4    Number of Visits 25    Date for PT Re-Evaluation 12/12/23    Progress Note Due on Visit 10    PT Start Time 1402    PT Stop Time 1441    PT Time Calculation (min) 39 min    Equipment Utilized During Treatment Gait belt    Activity Tolerance Patient tolerated treatment well;Patient limited by pain    Behavior During Therapy WFL for tasks assessed/performed           Past Medical History:  Diagnosis Date   Allergy    Arthritis    lower back, left shoulder   Asthma    Carpal tunnel syndrome on both sides    Dental bridge present    top   Dental crown present    multiple   Family history of adverse reaction to anesthesia    Father and sister - PONV   GERD (gastroesophageal reflux disease)    Glaucoma    Hypertension    Leaky heart valve    Pre-diabetes    Sleep apnea    Past Surgical History:  Procedure Laterality Date   CATARACT EXTRACTION W/PHACO Right 11/16/2016   Procedure: CATARACT EXTRACTION PHACO AND INTRAOCULAR LENS PLACEMENT (IOC)  right;  Surgeon: Mittie Gaskin, MD;  Location: [facility];  Service: Ophthalmology;  Laterality: Right;  pre diabetic latex sensitivity sleep apnea   CATARACT EXTRACTION W/PHACO Left 01/18/2017   Procedure: CATARACT EXTRACTION PHACO AND INTRAOCULAR LENS PLACEMENT (IOC) LEFT DIABETIC;  Surgeon: Mittie Gaskin, MD;  Location: [facility];  Service: Ophthalmology;  Laterality: Left;   COLONOSCOPY     COLONOSCOPY WITH PROPOFOL  N/A 04/09/2018   Procedure: COLONOSCOPY WITH PROPOFOL ;  Surgeon: Viktoria Lamar DASEN, MD;  Location: [facility];  Service: Endoscopy;  Laterality: N/A;   IRIDOTOMY / IRIDECTOMY Bilateral 2005   TOOTH EXTRACTION     Patient Active Problem List    Diagnosis Date Noted   Chronic bilateral low back pain with right-sided sciatica 09/29/2023   Hypertension associated with diabetes (HCC) 01/20/2023   Combined hyperlipidemia associated with type 2 diabetes mellitus (HCC) 01/20/2023   Intertrigo 11/01/2022   Stasis dermatitis of both legs 10/21/2022   Leg swelling 10/21/2022   Lymphedema of lower extremity 10/21/2022   Type 2 diabetes mellitus without complication, without long-term current use of insulin (HCC) 05/24/2022   Mixed hyperlipidemia 05/24/2022   Essential hypertension, benign 05/24/2022   Absolute anemia 05/24/2022   Gastroesophageal reflux disease without esophagitis 05/24/2022   Allergic rhinitis 05/24/2022   Asthma, allergic, moderate persistent, uncomplicated 05/24/2022   Bilateral chronic angle-closure glaucoma, indeterminate stage 05/24/2022   Airway hyperreactivity 08/29/2013   OSA on CPAP 08/29/2013    PCP: Fernand Fredy RAMAN, MD  REFERRING PROVIDER: Fernand Fredy, RAMAN, MD  REFERRING DIAG:  Diagnosis  G89.29,M54.41 (ICD-10-CM) - Chronic bilateral low back pain with right-sided sciatica    Rationale for Evaluation and Treatment: Rehabilitation  THERAPY DIAG:  No diagnosis found.  ONSET DATE: >3 years   SUBJECTIVE:  SUBJECTIVE STATEMENT:  Today: Pt reports being sore since last session in her back and hips. Pt reports her exercises have been going well for the most part, reports pain and difficulty with 2 of them which were adjusted during session and new handout provided.   From EVAL: Pt is a pleasant 73 y/o female presenting to PT eval for chronic LBP with R side hip pain. She reports onset of LBP over 3 years ago. Primary site of pain is mostly felt in R hip and lower spine. Pt reports hx of arthritis in lower back and R hip.  She is now starting to feel arthritis pain in her knees as well. Reports RLE is actually her good leg, has some issues with pain into groin region of LLE. Pt states strength in legs has been impacted and that her general mobility is worse. Pt can only ambulate comfortably by taking weight off of lower back by using her 4WW. She states not using 4WW so much for balance, but for reducing pain with gait. She is unable to stand up fully due to pain. Pt says she used to be very fit (took take karate, gymnastics years ago). She is also seeing OT for BLE lymphedema management. She endorses difficulty getting out of chairs, difficulty with steps, she must use a ramp to get in and out of her home. Pt reports my brain does not retain what I'm talking about, can lose train of thought.  PERTINENT HISTORY:  PMH per chart includes arthritis low back and left shoulder, bilateral carpal tunnel, asthma, glaucoma, HTN, leaky heart valve, sleep apnea, pre-diabetes, being seen by lymphedema specialist for BLE lymphedema   PAIN:  Are you having pain? LBP and R hip pain  Lowest pain level: 0/10  With standing: 2/10  Worst: 5-6/10   PRECAUTIONS: LATEX ALLERGY per chart, fall  RED FLAGS: None   WEIGHT BEARING RESTRICTIONS: No  FALLS:  Has patient fallen in last 6 months? No  LIVING ENVIRONMENT: Uses ramp to get into home due to difficulty with steps, has 4WW  PLOF: Independent  PATIENT GOALS:  Says due to back pain she is unable to reach my feet, has difficulty getting her socks on without a grabber, wants to be more flexible and stronger to increase ease with these activities and mobility    OBJECTIVE:  Note: Objective measures were completed at Evaluation unless otherwise noted.  DIAGNOSTIC FINDINGS:  No recent pertinent imaging available in chart  PATIENT SURVEYS:  Modified Oswestry score: 34%  Interpretation of scores: Score Category Description  0-20% Minimal Disability The patient can  cope with most living activities. Usually no treatment is indicated apart from advice on lifting, sitting and exercise  21-40% Moderate Disability The patient experiences more pain and difficulty with sitting, lifting and standing. Travel and social life are more difficult and they may be disabled from work. Personal care, sexual activity and sleeping are not grossly affected, and the patient can usually be managed by conservative means  41-60% Severe Disability Pain remains the main problem in this group, but activities of daily living are affected. These patients require a detailed investigation  61-80% Crippled Back pain impinges on all aspects of the patient's life. Positive intervention is required  81-100% Bed-bound  These patients are either bed-bound or exaggerating their symptoms  Bluford FORBES Zoe DELENA Karon DELENA, et al. Surgery versus conservative management of stable thoracolumbar fracture: the PRESTO feasibility RCT. [place]): [employer]; 2021 Nov. [facility] Technology Assessment,  No. 25.62.) Appendix 3, Oswestry Disability Index category descriptors. Available from: FindJewel[employer].cz  Minimally Clinically Important Difference (MCID) = 12.8%  COGNITION: Overall cognitive status: Within functional limits for tasks assessed, very pleasant pt can become tangential and requires redirection to activity    SENSATION: Intact to light touch BLE   MUSCLE LENGTH: deferred  POSTURE: increased thoracic kyphosis, rounded shoulders, unable to achieve full upright position due to pain, maintains increased hip and knee flexion when attempting to stand fully upright   PALPATION: deferred  Thoracolumbar AROM: Extension - unable to achieve neutral position, remains in significant flexion  Flexion - lacking at least 25%  Rotation - lacking at least 40% bilat  and pain-limited    LOWER EXTREMITY MMT:    MMT Right eval Left eval  Hip flexion 4- 3*   Hip extension    Hip abduction 4+ 4+  Hip adduction 4+ 4+  Hip internal rotation    Hip external rotation    Knee flexion 4 3*  Knee extension 4+ 4+  Ankle dorsiflexion 4 4+  Ankle plantarflexion    Ankle inversion    Ankle eversion     (Blank rows = not tested) *=pain limited   LUMBAR SPECIAL TESTS:  deferred  FUNCTIONAL TESTS:  10 meter walk test: 0.63 m/s crouched posture, decreased hip extension, decreased step-length and heel strike, elevated R>L shoulder, heavy BUE weightbearing on 4WW  GAIT: Distance walked: 10MWT/clinic distances (see above) Assistive device utilized: Environmental consultant - 4 wheeled Level of assistance: Modified independence Comments: gait mechanics impaired: decreased gait speed, crouched posture, decreased hip extension, decreased step-length and heel strike, elevated R>L shoulder, heavy BUE weightbearing on 4WW  TREATMENT DATE: 09/26/2023   THEREX:  Instructed patient in some Low back/LE stretching: - seated clamshell 2 x 10 reps with 3 sec hold  Transition to supine, difficulty with LE management and getting them onto mat.   -Lower trunk rotation- Hold 10 sec x 10 ea side, adjustment for hip and lumbar alignment for optimal form  - glute sets x 10  Glute bridge 2 x 10  - supine clam with GTB 2 x 10 ( non latex TB)   Self care:   Instruction and practice in log roll technique, initially provided tactile cues on first few reps then transitioned to pt completing independently, pt noted much improved pain with transitions with this technique.   Instructed pt in HEP and adjusted some exercises that were causing discomfort and provided pt with new and updated HEP handout.                                                                                                                 PATIENT EDUCATION:  Education details: Provided education on findings of exam, indications for plan, prognoses, goals, how PT can help                 Person educated:  Patient Education method: Explanation Education comprehension: verbalized understanding  HOME EXERCISE PROGRAM: Access Code: [email or web address] URL: [email or web address] Date:  10/05/2023 Prepared by: Lonni Gainer  Exercises - Supine Bridge  - 3 x weekly - 3 sets - 10 reps - Clamshell  - 3 x weekly - 3 sets - 10 reps - Supine Lower Trunk Rotation  - 1 x daily - 3 sets - 30 sec hold - Seated Hamstring Stretch  - 1 x daily - 3 sets - 30 hold - Seated Clamshell  - 1 x daily - 7 x weekly - 2 sets - 20 reps - 3 sec hold - Supine Transversus Abdominis Bracing - Hands on Stomach  - 3 x weekly - 3 sets - 10 reps - Supine Gluteal Sets  - 3 x weekly - 3 sets - 10 reps  ASSESSMENT:  CLINICAL IMPRESSION:  Patient arrived with good motivation for completion of pt activities.  Pt continues with primarily hip mobility and strength interventions this date with good response during session. Pt also provided with extensive education and instruction with log roll technique in order to prevent pt from repeatedly agitating low back and causing pain. Pt will likely need further instruction for effective carryover. Pt will continue to benefit from skilled physical therapy intervention to address impairments, improve QOL, and attain therapy goals.    OBJECTIVE IMPAIRMENTS: Abnormal gait, decreased activity tolerance, decreased balance, decreased mobility, difficulty walking, decreased strength, hypomobility, increased edema, improper body mechanics, postural dysfunction, and pain.   ACTIVITY LIMITATIONS: carrying, lifting, bending, squatting, stairs, transfers, bed mobility, and locomotion level  PARTICIPATION LIMITATIONS: meal prep, cleaning, shopping, community activity, and yard work  PERSONAL FACTORS: Age, Fitness, Sex, Time since onset of injury/illness/exacerbation, and 3+ comorbidities: PMH per chart includes arthritis low back and left shoulder, bilateral carpal tunnel, asthma, glaucoma, HTN,  leaky heart valve, sleep apnea, pre-diabetes, being seen by lymphedema specialist for BLE lymphedema  are also affecting patient's functional outcome.   REHAB POTENTIAL: Good  CLINICAL DECISION MAKING: Evolving/moderate complexity  EVALUATION COMPLEXITY: Moderate   GOALS: Goals reviewed with patient? Yes   SHORT TERM GOALS: Target date: 10/31/2023    Patient will be independent in home exercise program to improve strength/mobility for better functional independence with ADLs. Baseline:to be initiated  Goal status: INITIAL   LONG TERM GOALS: Target date: 12/12/2023    Patient will reduce modified Oswestry score to <20 as to demonstrate minimal disability with ADLs including improved sleeping tolerance, walking/sitting tolerance etc for better mobility with ADLs.  Baseline: 34 Goal status: INITIAL  2.  Patient (> 1 years old) will complete five times sit to stand test in < 15 seconds indicating an increased LE strength and improved balance. Baseline: 09/26/2023= 26.46 sec with heavy UE Support Goal status: INITIAL  3.  Patient will increase Berg Balance score by > 6 points to demonstrate decreased fall risk during functional activities Baseline: 09/26/2023= 31/56 Goal status: INITIAL  4.  Patient will increase 10 meter walk test to >1.33m/s as to improve gait speed for better community ambulation and to reduce fall risk. Baseline:  0.63 m/s with 4WW Goal status: INITIAL      PLAN:  PT FREQUENCY: 1-2x/week  PT DURATION: 12 weeks  PLANNED INTERVENTIONS: [id number]- PT Re-evaluation, 97750- Physical Performance Testing, 97110-Therapeutic exercises, 97530- Therapeutic activity, [id number]- Neuromuscular re-education, 97535- Self Care, [id number]- Manual therapy, [id number]- Gait training, [phone]- Orthotic Initial, [phone]- Orthotic/Prosthetic subsequent, [phone]- Canalith repositioning, Patient/Family education, Balance training, Stair training, Taping, Joint mobilization, Spinal mobilization,  Vestibular training, DME instructions, Cryotherapy, and Moist heat.  PLAN FOR NEXT SESSION:   Continue to progress  HEP Manual therapy for lumbar ROM/pain Therex for ROM, Low back/LE/Core strengthening Ensure log roll is being performed properly    Lonni KATHEE Gainer, PT 10/05/2023, 3:00 PM

## 2023-10-05 NOTE — Therapy (Signed)
 OUTPATIENT OCCUPATIONAL THERAPY TREATMENT NOTE AND PROGRESS REPORT  BILATERAL LOWER EXTREMITY LYMPHEDEMA  Patient Name: Judith Arnold MRN: [id number] DOB:November 19, 1950, 73 y.o., female Today's Date: 10/05/2023  REPORTING PERIOD:  08/22/23 - 10/05/23  END OF SESSION:   OT End of Session - 10/05/23 1515     Visit Number 20    Number of Visits 36    Date for OT Re-Evaluation 10/10/23    Equipment Utilized During Treatment sock donner; compression garment samples    Activity Tolerance Patient tolerated treatment well;No increased pain    Behavior During Therapy WFL for tasks assessed/performed           Past Medical History:  Diagnosis Date   Allergy    Arthritis    lower back, left shoulder   Asthma    Carpal tunnel syndrome on both sides    Dental bridge present    top   Dental crown present    multiple   Family history of adverse reaction to anesthesia    Father and sister - PONV   GERD (gastroesophageal reflux disease)    Glaucoma    Hypertension    Leaky heart valve    Pre-diabetes    Sleep apnea    Past Surgical History:  Procedure Laterality Date   CATARACT EXTRACTION W/PHACO Right 11/16/2016   Procedure: CATARACT EXTRACTION PHACO AND INTRAOCULAR LENS PLACEMENT (IOC)  right;  Surgeon: Judith Gaskin, MD;  Location: [facility];  Service: Ophthalmology;  Laterality: Right;  pre diabetic latex sensitivity sleep apnea   CATARACT EXTRACTION W/PHACO Left 01/18/2017   Procedure: CATARACT EXTRACTION PHACO AND INTRAOCULAR LENS PLACEMENT (IOC) LEFT DIABETIC;  Surgeon: Judith Gaskin, MD;  Location: [facility];  Service: Ophthalmology;  Laterality: Left;   COLONOSCOPY     COLONOSCOPY WITH PROPOFOL  N/A 04/09/2018   Procedure: COLONOSCOPY WITH PROPOFOL ;  Surgeon: Judith Lamar DASEN, MD;  Location: [facility];  Service: Endoscopy;  Laterality: N/A;   IRIDOTOMY / IRIDECTOMY Bilateral 2005   TOOTH EXTRACTION     Patient Active Problem List    Diagnosis Date Noted   Chronic bilateral low back pain with right-sided sciatica 09/29/2023   Hypertension associated with diabetes (HCC) 01/20/2023   Combined hyperlipidemia associated with type 2 diabetes mellitus (HCC) 01/20/2023   Intertrigo 11/01/2022   Stasis dermatitis of both legs 10/21/2022   Leg swelling 10/21/2022   Lymphedema of lower extremity 10/21/2022   Type 2 diabetes mellitus without complication, without long-term current use of insulin (HCC) 05/24/2022   Mixed hyperlipidemia 05/24/2022   Essential hypertension, benign 05/24/2022   Absolute anemia 05/24/2022   Gastroesophageal reflux disease without esophagitis 05/24/2022   Allergic rhinitis 05/24/2022   Asthma, allergic, moderate persistent, uncomplicated 05/24/2022   Bilateral chronic angle-closure glaucoma, indeterminate stage 05/24/2022   Airway hyperreactivity 08/29/2013   OSA on CPAP 08/29/2013    PCP: Judith GORMAN Bathe, MD  REFERRING PROVIDER: same  REFERRING DIAG: lymphedema I89.0  THERAPY DIAG:  No diagnosis found.  Rationale for Evaluation and Treatment: Rehabilitation  ONSET DATE: Pt reports onset of L ankle / leg swelling after injury 20-30 yrs ago. Then about 1 year ago legs started swelling and Pt having prickly sensation on shins and thighs bilaterally.   SUBJECTIVE:  SUBJECTIVE STATEMENT: Judith Arnold presents to OT for treatment of BLE lymphedema 2/2 suspected CVI and obesity. Pt is unaccompanied today. Pt endorses pain in L leg 3-4/10. Pt comes directly from Pt appointment. She is not wearing compression wraps.   (INITIAL EVAL 07/12/23 : Judith Arnold is referred to Occupational Therapy by Judith GORMAN Bathe, MD,  for evaluation and treatment of BLE lymphedema.  Pt reports onset of L ankle and distal leg  swelling about 30 years ago, then noticed RLE swelling and worsening LLE swelling about 1 year ago at same time she started having unusual sensation in legs. Pt denies previously having lymphedema treatment. Pt reports paternal aunt had leg swelling. Pt is unable to wear compression stockings because she is unable to reach feet and distal legs to don and doff them due to back and hip pain.)  PERTINENT HISTORY: Relevant to lymphedema (OSA (denies using CPAP), BLE stasis dermatitis, HTN, DM 2 (Pt states she is prediabetic only), B glaucoma, Back and R hip OA  PAIN:  Are you having pain? Yes: NPRS scale: 3/10 Pain location: medial proximal thighs w abduction Pain description: like stickers Aggravating factors: standing, walking, dependent sitting Relieving factors: elevation  PRECAUTIONS: Fall and Other: LYMPHEDEMA precautions  (DM 2 and asthma)  WEIGHT BEARING RESTRICTIONS: No  FALLS:  Has patient fallen in last 6 months? yes Fell asleep at the computer and fell onto the floor [place] when storm door hit me  LIVING ENVIRONMENT: Lives with: alone Lives in: House/apartment Stairs: No;  Has following equipment at home: Environmental consultant - 4 wheeled  OCCUPATION: retired Scientist, research (life sciences)  LEISURE: reading and taking notes on current events and politics  HAND DOMINANCE: right   PRIOR LEVEL OF FUNCTION: Independent with household mobility with device, Requires assistive device for independence, Needs assistance with ADLs, Needs assistance with homemaking, and Needs assistance with gait  PATIENT GOALS: reduce swelling and keep it from getting worse  OBJECTIVE: Note: Objective measures were completed at Evaluation unless otherwise noted.  COGNITION:  Overall cognitive status: impaired short term  memory   OBSERVATIONS / OTHER ASSESSMENTS: Mild, Stage II, BLE Lymphedema 2/2 suspected venous insufficiency.   SENSATION: Reports uncomfortable scratching, or sticker-like  sensation on anterior  thighs   POSTURE: Raised walker handles 2 notches to increase upright spinal alignment when walking to limit falls risk. Handles need to be raised at least 1 hole more for safe upright posture when walking.  Upright sitting posture: head forward , shoulders forward and rounded- appears to be flexible kyphosis  LE ROM: WFL for ankles and knees. Pt reports limited hip abduction )  LE MMT: WFL FOR LYMPHEDEMA CARE. Presenting with generalized weakness and Pt reports debility  LYMPHEDEMA ASSESSMENTS:   SURGERY TYPE/DATE: Non-cancer related limb swelling  HX INFECTIONS: positive for 1 episode cellulitis treated w antibiotics  Hx WOUNDS: denies   BLE COMPARATIVE LIMB VOLUMETRICS: INITIAL 07/19/23  LANDMARK RIGHT (dominant)  R LEG (A-D) 3525.1 ml  R THIGH (E-G) ml  R FULL LIMB (A-G) ml  Limb Volume differential (LVD)  %  Volume change since initial %  Volume change overall V  (Blank rows = not tested)  LANDMARK LEFT    L LEG (A-D) 3769.6 ml  L THIGH (E-G) ml  L FULL LIMB (A-G) ml  Limb Volume differential (LVD)  Limb Volume Differential (LVD) measures 6.5%, L>R.  Volume change since initial %  Volume change overall %    RLE COMPARATIVE LIMB VOLUMETRICS: VISIT 9 08/15/23  LANDMARK RIGHT (dominant)  R LEG (A-D) 3549.4 ml  R THIGH (E-G) ml  R FULL LIMB (A-G) ml  Limb Volume differential (LVD)  %  Volume change since initial R LEG volume reduced by 5.84% since initially measured on 07/18/21  Volume change overall V  (Blank rows = not tested)  RLE COMPARATIVE LIMB VOLUMETRICS: VISIT 19 10/03/23  LANDMARK RIGHT (dominant)  R LEG (A-D) 3549.4 ml  R THIGH (E-G) ml  R FULL LIMB (A-G) ml  Limb Volume differential (LVD)  %  Volume change since initial R LEG volume reduced by 14.7% since initially measured on 07/18/21  Volume change overall V  (Blank rows = not tested)  Mild, Stage  II, Bilateral Lower Extremity Lymphedema 2/2 CVI and Obesity  Skin  Description Hyper-Keratosis  Peau' de Orange Shiny Tight Fibrotic/ Indurated Fatty Doughy Spongy/ boggy       R>L x  x   Skin dry Flaky WNL Macerated   mildly      Color Redness Varicosities Blanching Hemosiderin Stain Mottled   x     x   Odor Malodorous Yeast Fungal infection  WNL      x   Temperature Warm Cool wnl    x     Pitting Edema   1+ 2+ 3+ 4+ Non-pitting         x   Girth Symmetrical Asymmetrical                   Distribution    R>L toes to groin    Stemmer Sign Positive Negative   +    Lymphorrhea History Of:  Present Absent     x    Wounds History Of Present Absent Venous Arterial Pressure Sheer     x        Signs of Infection Redness Warmth Erythema Acute Swelling Drainage Borders                    Sensation Light Touch Deep pressure Hypersensitivity   Present Impaired Present Impaired Absent Impaired   x Tactile  x  x     Nails WNL   Fungus nail dystrophy   x     Hair Growth Symmetrical Asymmetrical   x    Skin Creases Base of toes  Ankles   Base of Fingers knees       Abdominal pannus Thigh Lobules  Face/neck   x x  x      (Blank rows = not tested)    GAIT: Distance walked: >500' Assistive device utilized: Environmental consultant - 4 wheeled Level of assistance: Modified independence Comments: Pt bent at waist nearly 90 degrees with walker handles in lowest position  LYMPHEDEMA LIFE IMPACT SCALE (LLIS): TBA OT Rx visit 1  TREATMENT DATE:  RLE/RLQ MLD and skin care RLE multilayer compression bandaging Pt edu for lymphedema progress towards goals and self-care throughout session    PATIENT EDUCATION:  Continued Pt/ CG edu for lymphedema self care home program throughout session. Topics include outcome of comparative limb volumetrics- starting limb volume differentials (LVDs), technology and gradient techniques used for short stretch, multilayer compression wrapping, simple self-MLD, therapeutic lymphatic pumping exercises, skin/nail care, LE precautions, compression  garment recommendations and specifications, wear and care schedule and compression garment donning / doffing w assistive devices. Discussed progress towards all OT goals since commencing CDT. Discussed detrimental impact of obesity on lower and upper extremity lymphedema over time. Reviewed OT goals for lymphedema care with  Pt and discussed progress to date.  All questions answered to the Pt's satisfaction. Good return. Person educated: Patient  Education method: Explanation, Demonstration, and Handouts Education comprehension: verbalized understanding, returned demonstration, verbal cues required, and needs further education   HOME EXERCISE PROGRAM: BLE lymphatic pumping there ex using- 1 sets of 10 reps, each exercise in order-  1-2 x daily, bilaterally Simple self MLD 1 x daily Daily skin care to increase hydration, skin mobility and decrease infection risk- can be done during MLD During Intensive Phase CDT: Compression wraps 23/7 until limb volume reduction complete During Self-management Phase CDT: Fit with appropriate compression garments or alternatives. Consider BLE, knee length, Mediven, custom, CircAid , Velcro style leggings over soft cotton liners.  ASSESSMENT: CLINICAL IMPRESSION: Pt continues to make steady progress towards all OT goals for lymphedema care.  RLE comparative limb volumetrics reveal R LEG volume is  reduced by 14.7% since initially measured on 07/18/21. This value meets and exceeds the 10% reduction goal. RLE Velcro-style compression wraps are ordered (CircAid Juxtafit Essential size LARGE/SHORT). Please review GOALS section of this note for additional details. Continued MLD with simultaneous skin care to RLE/RLQ today without pain. Re applied compression wraps to RLE as established and completed session. Cont as per POC.  (INITIAL EVAL 07/12/23: Bobbye Graylin Pizza is a 73 yo female presenting with chronic, progressive, BLE swelling 2/2 unknown etiology, which at first  glance appears to be circulatory in nature due to skin coloring at distal legs where swelling is most invested. Pt is presently unable to wear traditional elastic compression garments due to difficulty donning and doffing them.  Chronic, progressive lymphedema with associated skin changes, including fibrosis, limits this patent's functional performance in all occupational domains, including functional ambulation and mobility, basic and instrumental ADLs (lower body dressing, LB bathing, fitting street shoes and LB clothing, driving, shopping, and home management. It also limits perform productive activities, leisure pursuits, and participation in social and community activities. BLE lymphedema contributes to elevated infection risk. Pt will benefit from skilled OT for Complete Decongestive Therapy (CDT), which  typically includes manual lymphatic drainage (MLD), skin care to limit' infection risk and increase skin excursion, lymphatic pumping exercise, and during the Intensive Phase multilayer, gradient compression bandaging to reduce limb volume. Once limb volume is reduced as much as possible, Pt is fitted with appropriate compression garments and/ or devices and transitions into the self management phase of care consisting of follow long support PRN.    Pt understands that her fair prognosis will become poor without daily assistance with compression wrapping since she is unable to reach feet and legs to apply them herself. Pt assures OT that she has a friend she believes is willing to assist her between OT sessions. )   OBJECTIVE IMPAIRMENTS: decreased standing and walking tolerance, activity tolerance, decreased balance, decreased knowledge of condition, decreased knowledge of use of DME, decreased mobility, decreased ROM, decreased strength, increased edema, impaired flexibility, impaired sensation, impaired UE functional use, impaired vision/reception, pain, and chronic, progressive, BLE swelling and  associated skin changes, at increased infection risk   ACTIVITY LIMITATIONS: Functional ambulation and mobility (lifting, carrying, steps/stairs, transfers, squatting) , Basic and instrumental ADLs, leisure pursuits, productive activities, social participation  PARTICIPATION LIMITATIONS: meal prep, cleaning, laundry, driving, shopping, yard work, and    PERSONAL FACTORS: Age, Fitness, Past/current experiences, Time since onset of injury/illness/exacerbation, 2+ co morbidities: OSA, HTN,  are also affecting patient's functional outcome.   REHAB POTENTIAL: Good  EVALUATION COMPLEXITY: Moderate  GOALS: Goals reviewed with patient? Yes  SHORT TERM GOALS: Target date: 4th OT Rx visit   Pt will demonstrate understanding of lymphedema precautions and prevention strategies with modified independence using a printed reference to identify at least 5 precautions and discussing how s/he may implement them into daily life to reduce risk of progression with extra time. Baseline:Max A Goal status: GOAL MET  2.  Pt will be able to apply multilayer, knee length, gradient, compression wraps to one leg at a time from toes to below knee with max caregiver assist to decrease limb volume, to limit infection risk, and to limit lymphedema progression.  Baseline: Dependent Goal status: GOAL MET  LONG TERM GOALS: Target date: 09/19/23  1.Given this patient's Intake score of TBA % on the Lymphedema Life Impact Scale (LLIS), patient will experience a reduction of at least 5 points in her perceived level of functional impairment resulting from lymphedema to improve functional performance and quality of life (QOL). Baseline: TBA % Goal status:  GOAL DEFERRED  2.  Pt will achieve at least a 10% volume reduction in B legs to return limb to typical size and shape, to limit infection risk and LE progression, to decrease pain, to improve function. Baseline: Dependent Goal status: PROGRESSING. R LEG volume reduced by  14.7% since initially measured on 07/18/21  3.  Pt will obtain appropriate compression garments/devices and achieve modified independence (extra time + assistive devices) with donning/doffing to optimize limb volume reductions and limit LE progression over time. Baseline: Dependent Goal status: PROGRESSING  During Intensive phase CDT, with max CG assistance, Pt will achieve at least 85% compliance with all adapted lymphedema self-care home program components, including daily skin care, compression wraps and /or garments, simple self MLD and lymphatic pumping therex to habituate LE self care protocol  into ADLs for optimal LE self-management over time. Baseline: Dependent Goal status:PROGRESSING  PLAN:  OT FREQUENCY: 2 x/week  OT DURATION: 12 weeks  PLANNED INTERVENTIONS:compression bandaging, skin care,  97110-Therapeutic exercises, 97530- Therapeutic activity, 97535- Self Care, Manual lymph drainage, DME instructions, and fit with appropriate compression  PLAN FOR NEXT SESSION:  Measure R leg to assess progress towards a stocking, or some alternative Cont Pt edu for LE self-care   Zebedee Dec, MS, OTR/L, CLT-LANA 10/05/23 3:17 PM

## 2023-10-09 ENCOUNTER — Ambulatory Visit: Admitting: Occupational Therapy

## 2023-10-09 ENCOUNTER — Ambulatory Visit

## 2023-10-09 ENCOUNTER — Telehealth: Payer: Self-pay

## 2023-10-09 NOTE — Telephone Encounter (Signed)
 Patient Name: Judith Arnold MRN: [id number] DOB:[birth year], 73 y.o., female Today's Date: 10/09/2023  Pt contacted via telephone and she answered stating she forgot about her appointment today. Author offered afternoon appointment times for both PT and OT lymphedema but she declined stating she did not want to have to go back out in the heat today after just returning from the grocery store. Stated she will be here for next appts and verified time/date with patient.      Reyes LOISE London, PT 10/09/2023, 11:21 AM

## 2023-10-09 NOTE — Therapy (Incomplete)
 OUTPATIENT PHYSICAL THERAPY THORACOLUMBAR TREATMENT   Patient Name: Judith Arnold MRN: [id number] DOB:[birth year], 73 y.o., female Today's Date: 10/09/2023  END OF SESSION:     Past Medical History:  Diagnosis Date   Allergy    Arthritis    lower back, left shoulder   Asthma    Carpal tunnel syndrome on both sides    Dental bridge present    top   Dental crown present    multiple   Family history of adverse reaction to anesthesia    Father and sister - PONV   GERD (gastroesophageal reflux disease)    Glaucoma    Hypertension    Leaky heart valve    Pre-diabetes    Sleep apnea    Past Surgical History:  Procedure Laterality Date   CATARACT EXTRACTION W/PHACO Right 11/16/2016   Procedure: CATARACT EXTRACTION PHACO AND INTRAOCULAR LENS PLACEMENT (IOC)  right;  Surgeon: Mittie Gaskin, MD;  Location: [facility];  Service: Ophthalmology;  Laterality: Right;  pre diabetic latex sensitivity sleep apnea   CATARACT EXTRACTION W/PHACO Left 01/18/2017   Procedure: CATARACT EXTRACTION PHACO AND INTRAOCULAR LENS PLACEMENT (IOC) LEFT DIABETIC;  Surgeon: Mittie Gaskin, MD;  Location: [facility];  Service: Ophthalmology;  Laterality: Left;   COLONOSCOPY     COLONOSCOPY WITH PROPOFOL  N/A 04/09/2018   Procedure: COLONOSCOPY WITH PROPOFOL ;  Surgeon: Viktoria Lamar DASEN, MD;  Location: [facility];  Service: Endoscopy;  Laterality: N/A;   IRIDOTOMY / IRIDECTOMY Bilateral 2005   TOOTH EXTRACTION     Patient Active Problem List   Diagnosis Date Noted   Chronic bilateral low back pain with right-sided sciatica 09/29/2023   Hypertension associated with diabetes (HCC) 01/20/2023   Combined hyperlipidemia associated with type 2 diabetes mellitus (HCC) 01/20/2023   Intertrigo 11/01/2022   Stasis dermatitis of both legs 10/21/2022   Leg swelling 10/21/2022   Lymphedema of lower extremity 10/21/2022   Type 2 diabetes mellitus without complication, without  long-term current use of insulin (HCC) 05/24/2022   Mixed hyperlipidemia 05/24/2022   Essential hypertension, benign 05/24/2022   Absolute anemia 05/24/2022   Gastroesophageal reflux disease without esophagitis 05/24/2022   Allergic rhinitis 05/24/2022   Asthma, allergic, moderate persistent, uncomplicated 05/24/2022   Bilateral chronic angle-closure glaucoma, indeterminate stage 05/24/2022   Airway hyperreactivity 08/29/2013   OSA on CPAP 08/29/2013    PCP: Fernand Fredy RAMAN, MD  REFERRING PROVIDER: Fernand Fredy, RAMAN, MD  REFERRING DIAG:  Diagnosis  G89.29,M54.41 (ICD-10-CM) - Chronic bilateral low back pain with right-sided sciatica    Rationale for Evaluation and Treatment: Rehabilitation  THERAPY DIAG:  No diagnosis found.  ONSET DATE: >3 years   SUBJECTIVE:  SUBJECTIVE STATEMENT:  Today: Pt reports being sore since last session in her back and hips. Pt reports her exercises have been going well for the most part, reports pain and difficulty with 2 of them which were adjusted during session and new handout provided.   From EVAL: Pt is a pleasant 73 y/o female presenting to PT eval for chronic LBP with R side hip pain. She reports onset of LBP over 3 years ago. Primary site of pain is mostly felt in R hip and lower spine. Pt reports hx of arthritis in lower back and R hip. She is now starting to feel arthritis pain in her knees as well. Reports RLE is actually her good leg, has some issues with pain into groin region of LLE. Pt states strength in legs has been impacted and that her general mobility is worse. Pt can only ambulate comfortably by taking weight off of lower back by using her 4WW. She states not using 4WW so much for balance, but for reducing pain with gait. She is unable to stand up  fully due to pain. Pt says she used to be very fit (took take karate, gymnastics years ago). She is also seeing OT for BLE lymphedema management. She endorses difficulty getting out of chairs, difficulty with steps, she must use a ramp to get in and out of her home. Pt reports my brain does not retain what I'm talking about, can lose train of thought.  PERTINENT HISTORY:  PMH per chart includes arthritis low back and left shoulder, bilateral carpal tunnel, asthma, glaucoma, HTN, leaky heart valve, sleep apnea, pre-diabetes, being seen by lymphedema specialist for BLE lymphedema   PAIN:  Are you having pain? LBP and R hip pain  Lowest pain level: 0/10  With standing: 2/10  Worst: 5-6/10   PRECAUTIONS: LATEX ALLERGY per chart, fall  RED FLAGS: None   WEIGHT BEARING RESTRICTIONS: No  FALLS:  Has patient fallen in last 6 months? No  LIVING ENVIRONMENT: Uses ramp to get into home due to difficulty with steps, has 4WW  PLOF: Independent  PATIENT GOALS:  Says due to back pain she is unable to reach my feet, has difficulty getting her socks on without a grabber, wants to be more flexible and stronger to increase ease with these activities and mobility    OBJECTIVE:  Note: Objective measures were completed at Evaluation unless otherwise noted.  DIAGNOSTIC FINDINGS:  No recent pertinent imaging available in chart  PATIENT SURVEYS:  Modified Oswestry score: 34%  Interpretation of scores: Score Category Description  0-20% Minimal Disability The patient can cope with most living activities. Usually no treatment is indicated apart from advice on lifting, sitting and exercise  21-40% Moderate Disability The patient experiences more pain and difficulty with sitting, lifting and standing. Travel and social life are more difficult and they may be disabled from work. Personal care, sexual activity and sleeping are not grossly affected, and the patient can usually be managed by conservative  means  41-60% Severe Disability Pain remains the main problem in this group, but activities of daily living are affected. These patients require a detailed investigation  61-80% Crippled Back pain impinges on all aspects of the patient's life. Positive intervention is required  81-100% Bed-bound  These patients are either bed-bound or exaggerating their symptoms  Bluford FORBES Zoe DELENA Karon DELENA, et al. Surgery versus conservative management of stable thoracolumbar fracture: the PRESTO feasibility RCT. [place]): [employer]; 2021 Nov. The [facility] Technology Assessment,  No. 25.62.) Appendix 3, Oswestry Disability Index category descriptors. Available from: FindJewel[employer].cz  Minimally Clinically Important Difference (MCID) = 12.8%  COGNITION: Overall cognitive status: Within functional limits for tasks assessed, very pleasant pt can become tangential and requires redirection to activity    SENSATION: Intact to light touch BLE   MUSCLE LENGTH: deferred  POSTURE: increased thoracic kyphosis, rounded shoulders, unable to achieve full upright position due to pain, maintains increased hip and knee flexion when attempting to stand fully upright   PALPATION: deferred  Thoracolumbar AROM: Extension - unable to achieve neutral position, remains in significant flexion  Flexion - lacking at least 25%  Rotation - lacking at least 40% bilat  and pain-limited    LOWER EXTREMITY MMT:    MMT Right eval Left eval  Hip flexion 4- 3*  Hip extension    Hip abduction 4+ 4+  Hip adduction 4+ 4+  Hip internal rotation    Hip external rotation    Knee flexion 4 3*  Knee extension 4+ 4+  Ankle dorsiflexion 4 4+  Ankle plantarflexion    Ankle inversion    Ankle eversion     (Blank rows = not tested) *=pain limited   LUMBAR SPECIAL TESTS:  deferred  FUNCTIONAL TESTS:  10 meter walk test: 0.63 m/s crouched posture, decreased hip extension, decreased  step-length and heel strike, elevated R>L shoulder, heavy BUE weightbearing on 4WW  GAIT: Distance walked: 10MWT/clinic distances (see above) Assistive device utilized: Environmental consultant - 4 wheeled Level of assistance: Modified independence Comments: gait mechanics impaired: decreased gait speed, crouched posture, decreased hip extension, decreased step-length and heel strike, elevated R>L shoulder, heavy BUE weightbearing on 4WW  TREATMENT DATE: 10/09/2023   THEREX:  Instructed patient in some Low back/LE stretching: - seated clamshell 2 x 10 reps with 3 sec hold  Transition to supine, difficulty with LE management and getting them onto mat.   -Lower trunk rotation- Hold 10 sec x 10 ea side, adjustment for hip and lumbar alignment for optimal form  - glute sets x 10  Glute bridge 2 x 10  - supine clam with GTB 2 x 10 ( non latex TB)   Self care:   Instruction and practice in log roll technique, initially provided tactile cues on first few reps then transitioned to pt completing independently, pt noted much improved pain with transitions with this technique.   Instructed pt in HEP and adjusted some exercises that were causing discomfort and provided pt with new and updated HEP handout.                                                                                                                 PATIENT EDUCATION:  Education details: Provided education on findings of exam, indications for plan, prognoses, goals, how PT can help                 Person educated: Patient Education method: Explanation Education comprehension: verbalized understanding  HOME EXERCISE PROGRAM: Access Code: [email or web address] URL: [email or web address] Date: 10/05/2023  Prepared by: Lonni Gainer  Exercises - Supine Bridge  - 3 x weekly - 3 sets - 10 reps - Clamshell  - 3 x weekly - 3 sets - 10 reps - Supine Lower Trunk Rotation  - 1 x daily - 3 sets - 30 sec hold - Seated Hamstring Stretch  - 1 x  daily - 3 sets - 30 hold - Seated Clamshell  - 1 x daily - 7 x weekly - 2 sets - 20 reps - 3 sec hold - Supine Transversus Abdominis Bracing - Hands on Stomach  - 3 x weekly - 3 sets - 10 reps - Supine Gluteal Sets  - 3 x weekly - 3 sets - 10 reps  ASSESSMENT:  CLINICAL IMPRESSION:  Patient arrived with good motivation for completion of pt activities.  Pt continues with primarily hip mobility and strength interventions this date with good response during session. Pt also provided with extensive education and instruction with log roll technique in order to prevent pt from repeatedly agitating low back and causing pain. Pt will likely need further instruction for effective carryover. Pt will continue to benefit from skilled physical therapy intervention to address impairments, improve QOL, and attain therapy goals.    OBJECTIVE IMPAIRMENTS: Abnormal gait, decreased activity tolerance, decreased balance, decreased mobility, difficulty walking, decreased strength, hypomobility, increased edema, improper body mechanics, postural dysfunction, and pain.   ACTIVITY LIMITATIONS: carrying, lifting, bending, squatting, stairs, transfers, bed mobility, and locomotion level  PARTICIPATION LIMITATIONS: meal prep, cleaning, shopping, community activity, and yard work  PERSONAL FACTORS: Age, Fitness, Sex, Time since onset of injury/illness/exacerbation, and 3+ comorbidities: PMH per chart includes arthritis low back and left shoulder, bilateral carpal tunnel, asthma, glaucoma, HTN, leaky heart valve, sleep apnea, pre-diabetes, being seen by lymphedema specialist for BLE lymphedema  are also affecting patient's functional outcome.   REHAB POTENTIAL: Good  CLINICAL DECISION MAKING: Evolving/moderate complexity  EVALUATION COMPLEXITY: Moderate   GOALS: Goals reviewed with patient? Yes   SHORT TERM GOALS: Target date: 10/31/2023    Patient will be independent in home exercise program to improve  strength/mobility for better functional independence with ADLs. Baseline:to be initiated  Goal status: INITIAL   LONG TERM GOALS: Target date: 12/12/2023    Patient will reduce modified Oswestry score to <20 as to demonstrate minimal disability with ADLs including improved sleeping tolerance, walking/sitting tolerance etc for better mobility with ADLs.  Baseline: 34 Goal status: INITIAL  2.  Patient (> 84 years old) will complete five times sit to stand test in < 15 seconds indicating an increased LE strength and improved balance. Baseline: 09/26/2023= 26.46 sec with heavy UE Support Goal status: INITIAL  3.  Patient will increase Berg Balance score by > 6 points to demonstrate decreased fall risk during functional activities Baseline: 09/26/2023= 31/56 Goal status: INITIAL  4.  Patient will increase 10 meter walk test to >1.14m/s as to improve gait speed for better community ambulation and to reduce fall risk. Baseline:  0.63 m/s with 4WW Goal status: INITIAL      PLAN:  PT FREQUENCY: 1-2x/week  PT DURATION: 12 weeks  PLANNED INTERVENTIONS: [id number]- PT Re-evaluation, 97750- Physical Performance Testing, 97110-Therapeutic exercises, 97530- Therapeutic activity, [id number]- Neuromuscular re-education, 97535- Self Care, [id number]- Manual therapy, [id number]- Gait training, [phone]- Orthotic Initial, [phone]- Orthotic/Prosthetic subsequent, [phone]- Canalith repositioning, Patient/Family education, Balance training, Stair training, Taping, Joint mobilization, Spinal mobilization, Vestibular training, DME instructions, Cryotherapy, and Moist heat.  PLAN FOR NEXT SESSION:   Continue to progress HEP  Manual therapy for lumbar ROM/pain Therex for ROM, Low back/LE/Core strengthening Ensure log roll is being performed properly    Reyes LOISE London, PT 10/09/2023, 6:42 AM

## 2023-10-10 NOTE — Therapy (Unsigned)
 OUTPATIENT PHYSICAL THERAPY THORACOLUMBAR TREATMENT   Patient Name: Judith Arnold MRN: [id number] DOB:1951-03-14, 73 y.o., female Today's Date: 10/11/2023  END OF SESSION:  PT End of Session - 10/11/23 1432     Visit Number 5    Number of Visits 25    Date for PT Re-Evaluation 12/12/23    Progress Note Due on Visit 10    PT Start Time 1445    PT Stop Time 1528    PT Time Calculation (min) 43 min    Equipment Utilized During Treatment Gait belt    Activity Tolerance Patient tolerated treatment well;Patient limited by pain    Behavior During Therapy WFL for tasks assessed/performed            Past Medical History:  Diagnosis Date   Allergy    Arthritis    lower back, left shoulder   Asthma    Carpal tunnel syndrome on both sides    Dental bridge present    top   Dental crown present    multiple   Family history of adverse reaction to anesthesia    Father and sister - PONV   GERD (gastroesophageal reflux disease)    Glaucoma    Hypertension    Leaky heart valve    Pre-diabetes    Sleep apnea    Past Surgical History:  Procedure Laterality Date   CATARACT EXTRACTION W/PHACO Right 11/16/2016   Procedure: CATARACT EXTRACTION PHACO AND INTRAOCULAR LENS PLACEMENT (IOC)  right;  Surgeon: Mittie Gaskin, MD;  Location: [facility];  Service: Ophthalmology;  Laterality: Right;  pre diabetic latex sensitivity sleep apnea   CATARACT EXTRACTION W/PHACO Left 01/18/2017   Procedure: CATARACT EXTRACTION PHACO AND INTRAOCULAR LENS PLACEMENT (IOC) LEFT DIABETIC;  Surgeon: Mittie Gaskin, MD;  Location: [facility];  Service: Ophthalmology;  Laterality: Left;   COLONOSCOPY     COLONOSCOPY WITH PROPOFOL  N/A 04/09/2018   Procedure: COLONOSCOPY WITH PROPOFOL ;  Surgeon: Viktoria Lamar DASEN, MD;  Location: [facility];  Service: Endoscopy;  Laterality: N/A;   IRIDOTOMY / IRIDECTOMY Bilateral 2005   TOOTH EXTRACTION     Patient Active Problem List    Diagnosis Date Noted   Chronic bilateral low back pain with right-sided sciatica 09/29/2023   Hypertension associated with diabetes (HCC) 01/20/2023   Combined hyperlipidemia associated with type 2 diabetes mellitus (HCC) 01/20/2023   Intertrigo 11/01/2022   Stasis dermatitis of both legs 10/21/2022   Leg swelling 10/21/2022   Lymphedema of lower extremity 10/21/2022   Type 2 diabetes mellitus without complication, without long-term current use of insulin (HCC) 05/24/2022   Mixed hyperlipidemia 05/24/2022   Essential hypertension, benign 05/24/2022   Absolute anemia 05/24/2022   Gastroesophageal reflux disease without esophagitis 05/24/2022   Allergic rhinitis 05/24/2022   Asthma, allergic, moderate persistent, uncomplicated 05/24/2022   Bilateral chronic angle-closure glaucoma, indeterminate stage 05/24/2022   Airway hyperreactivity 08/29/2013   OSA on CPAP 08/29/2013    PCP: Fernand Fredy RAMAN, MD  REFERRING PROVIDER: Fernand Fredy, RAMAN, MD  REFERRING DIAG:  Diagnosis  G89.29,M54.41 (ICD-10-CM) - Chronic bilateral low back pain with right-sided sciatica    Rationale for Evaluation and Treatment: Rehabilitation  THERAPY DIAG:  Other low back pain  Pain in right hip  Muscle weakness (generalized)  ONSET DATE: >3 years   SUBJECTIVE:  SUBJECTIVE STATEMENT:  Today: Pt reports she has been doing a few of her exercises but had some questions regarding completion of a couple. Informed pt of alternatives to exercises and pt also notes she bought a table she can safely do exercises on.   From EVAL: Pt is a pleasant 73 y/o female presenting to PT eval for chronic LBP with R side hip pain. She reports onset of LBP over 3 years ago. Primary site of pain is mostly felt in R hip and lower spine. Pt reports  hx of arthritis in lower back and R hip. She is now starting to feel arthritis pain in her knees as well. Reports RLE is actually her good leg, has some issues with pain into groin region of LLE. Pt states strength in legs has been impacted and that her general mobility is worse. Pt can only ambulate comfortably by taking weight off of lower back by using her 4WW. She states not using 4WW so much for balance, but for reducing pain with gait. She is unable to stand up fully due to pain. Pt says she used to be very fit (took take karate, gymnastics years ago). She is also seeing OT for BLE lymphedema management. She endorses difficulty getting out of chairs, difficulty with steps, she must use a ramp to get in and out of her home. Pt reports my brain does not retain what I'm talking about, can lose train of thought.  PERTINENT HISTORY:  PMH per chart includes arthritis low back and left shoulder, bilateral carpal tunnel, asthma, glaucoma, HTN, leaky heart valve, sleep apnea, pre-diabetes, being seen by lymphedema specialist for BLE lymphedema   PAIN:  Are you having pain? LBP and R hip pain  Lowest pain level: 0/10  With standing: 2/10  Worst: 5-6/10   PRECAUTIONS: LATEX ALLERGY per chart, fall  RED FLAGS: None   WEIGHT BEARING RESTRICTIONS: No  FALLS:  Has patient fallen in last 6 months? No  LIVING ENVIRONMENT: Uses ramp to get into home due to difficulty with steps, has 4WW  PLOF: Independent  PATIENT GOALS:  Says due to back pain she is unable to reach my feet, has difficulty getting her socks on without a grabber, wants to be more flexible and stronger to increase ease with these activities and mobility    OBJECTIVE:  Note: Objective measures were completed at Evaluation unless otherwise noted.  DIAGNOSTIC FINDINGS:  No recent pertinent imaging available in chart  PATIENT SURVEYS:  Modified Oswestry score: 34%  Interpretation of scores: Score Category Description   0-20% Minimal Disability The patient can cope with most living activities. Usually no treatment is indicated apart from advice on lifting, sitting and exercise  21-40% Moderate Disability The patient experiences more pain and difficulty with sitting, lifting and standing. Travel and social life are more difficult and they may be disabled from work. Personal care, sexual activity and sleeping are not grossly affected, and the patient can usually be managed by conservative means  41-60% Severe Disability Pain remains the main problem in this group, but activities of daily living are affected. These patients require a detailed investigation  61-80% Crippled Back pain impinges on all aspects of the patient's life. Positive intervention is required  81-100% Bed-bound  These patients are either bed-bound or exaggerating their symptoms  Bluford FORBES Zoe DELENA Karon DELENA, et al. Surgery versus conservative management of stable thoracolumbar fracture: the PRESTO feasibility RCT. [place] ([place]): [employer]; 2021 Nov. [facility] Technology Assessment, No.  25.62.) Appendix 3, Oswestry Disability Index category descriptors. Available from: FindJewel[employer].cz  Minimally Clinically Important Difference (MCID) = 12.8%  COGNITION: Overall cognitive status: Within functional limits for tasks assessed, very pleasant pt can become tangential and requires redirection to activity    SENSATION: Intact to light touch BLE   MUSCLE LENGTH: deferred  POSTURE: increased thoracic kyphosis, rounded shoulders, unable to achieve full upright position due to pain, maintains increased hip and knee flexion when attempting to stand fully upright   PALPATION: deferred  Thoracolumbar AROM: Extension - unable to achieve neutral position, remains in significant flexion  Flexion - lacking at least 25%  Rotation - lacking at least 40% bilat  and pain-limited    LOWER EXTREMITY MMT:    MMT  Right eval Left eval  Hip flexion 4- 3*  Hip extension    Hip abduction 4+ 4+  Hip adduction 4+ 4+  Hip internal rotation    Hip external rotation    Knee flexion 4 3*  Knee extension 4+ 4+  Ankle dorsiflexion 4 4+  Ankle plantarflexion    Ankle inversion    Ankle eversion     (Blank rows = not tested) *=pain limited   LUMBAR SPECIAL TESTS:  deferred  FUNCTIONAL TESTS:  10 meter walk test: 0.63 m/s crouched posture, decreased hip extension, decreased step-length and heel strike, elevated R>L shoulder, heavy BUE weightbearing on 4WW  GAIT: Distance walked: 10MWT/clinic distances (see above) Assistive device utilized: Environmental consultant - 4 wheeled Level of assistance: Modified independence Comments: gait mechanics impaired: decreased gait speed, crouched posture, decreased hip extension, decreased step-length and heel strike, elevated R>L shoulder, heavy BUE weightbearing on 4WW  TREATMENT DATE: 09/26/2023   THEREX:   - seated clamshell 2 x 10 reps with 3 sec hold   Transition to supine, instruction in log roll technique, improved form this date   -Lower trunk rotation- Hold 5 sec x 10 ea side, adjustment for hip and lumbar alignment for optimal form, heavy cues to prevent pain when going towards L side  - glute sets x 10 x 3 sec  Glute bridge 2 x 10, cues for proper knee position to prevent pain - supine clam with GTB 2 x 10 x 3 sec ( non latex TB), cues for performance in range that is not painful  - assistance with counting reps throughout session   Manual  SL gluteal and piriformis IASTM x 5 min ea side   Self care:   Assistance with log roll technique to ensure proper form. Also instructed pt to work on keeping neutral lumbar spine posture with rolling.                                                                                                                  PATIENT EDUCATION:  Education details: Provided education on findings of exam, indications for plan,  prognoses, goals, how PT can help                 Person educated: Patient  Education method: Explanation Education comprehension: verbalized understanding  HOME EXERCISE PROGRAM: Access Code: [email or web address] URL: [email or web address] Date: 10/05/2023 Prepared by: Lonni Gainer  Exercises - Supine Bridge  - 3 x weekly - 3 sets - 10 reps - Clamshell  - 3 x weekly - 3 sets - 10 reps - Supine Lower Trunk Rotation  - 1 x daily - 3 sets - 30 sec hold - Seated Hamstring Stretch  - 1 x daily - 3 sets - 30 hold - Seated Clamshell  - 1 x daily - 7 x weekly - 2 sets - 20 reps - 3 sec hold - Supine Transversus Abdominis Bracing - Hands on Stomach  - 3 x weekly - 3 sets - 10 reps - Supine Gluteal Sets  - 3 x weekly - 3 sets - 10 reps  ASSESSMENT:  CLINICAL IMPRESSION:  Patient arrived with good motivation for completion of pt activities.  Pt continues with primarily hip mobility and strength interventions this date with good response during session.  Patient very distractible with exercises and requires cues for keeping patient on track with various activities.  Patient is very dedicated and has been completing exercises at home as well as asking questions regarding proper completion of exercises when relevant. Pt will continue to benefit from skilled physical therapy intervention to address impairments, improve QOL, and attain therapy goals.    OBJECTIVE IMPAIRMENTS: Abnormal gait, decreased activity tolerance, decreased balance, decreased mobility, difficulty walking, decreased strength, hypomobility, increased edema, improper body mechanics, postural dysfunction, and pain.   ACTIVITY LIMITATIONS: carrying, lifting, bending, squatting, stairs, transfers, bed mobility, and locomotion level  PARTICIPATION LIMITATIONS: meal prep, cleaning, shopping, community activity, and yard work  PERSONAL FACTORS: Age, Fitness, Sex, Time since onset of injury/illness/exacerbation, and 3+  comorbidities: PMH per chart includes arthritis low back and left shoulder, bilateral carpal tunnel, asthma, glaucoma, HTN, leaky heart valve, sleep apnea, pre-diabetes, being seen by lymphedema specialist for BLE lymphedema  are also affecting patient's functional outcome.   REHAB POTENTIAL: Good  CLINICAL DECISION MAKING: Evolving/moderate complexity  EVALUATION COMPLEXITY: Moderate   GOALS: Goals reviewed with patient? Yes   SHORT TERM GOALS: Target date: 10/31/2023    Patient will be independent in home exercise program to improve strength/mobility for better functional independence with ADLs. Baseline:to be initiated  Goal status: INITIAL   LONG TERM GOALS: Target date: 12/12/2023    Patient will reduce modified Oswestry score to <20 as to demonstrate minimal disability with ADLs including improved sleeping tolerance, walking/sitting tolerance etc for better mobility with ADLs.  Baseline: 34 Goal status: INITIAL  2.  Patient (> 77 years old) will complete five times sit to stand test in < 15 seconds indicating an increased LE strength and improved balance. Baseline: 09/26/2023= 26.46 sec with heavy UE Support Goal status: INITIAL  3.  Patient will increase Berg Balance score by > 6 points to demonstrate decreased fall risk during functional activities Baseline: 09/26/2023= 31/56 Goal status: INITIAL  4.  Patient will increase 10 meter walk test to >1.28m/s as to improve gait speed for better community ambulation and to reduce fall risk. Baseline:  0.63 m/s with 4WW Goal status: INITIAL      PLAN:  PT FREQUENCY: 1-2x/week  PT DURATION: 12 weeks  PLANNED INTERVENTIONS: [id number]- PT Re-evaluation, 97750- Physical Performance Testing, 97110-Therapeutic exercises, 97530- Therapeutic activity, [id number]- Neuromuscular re-education, 97535- Self Care, [id number]- Manual therapy, [id number]- Gait training, [phone]- Orthotic Initial, [phone]- Orthotic/Prosthetic subsequent, [phone]- Canalith  repositioning, Patient/Family education, Balance training, Stair  training, Taping, Joint mobilization, Spinal mobilization, Vestibular training, DME instructions, Cryotherapy, and Moist heat.  PLAN FOR NEXT SESSION:   Continue to progress HEP Manual therapy for lumbar ROM/pain Therex for ROM, Low back/LE/Core strengthening Ensure log roll is being performed properly    Lonni KATHEE Gainer, PT 10/11/2023, 2:33 PM

## 2023-10-11 ENCOUNTER — Ambulatory Visit: Admitting: Physical Therapy

## 2023-10-11 ENCOUNTER — Ambulatory Visit: Admitting: Occupational Therapy

## 2023-10-11 DIAGNOSIS — M25551 Pain in right hip: Secondary | ICD-10-CM | POA: Diagnosis not present

## 2023-10-11 DIAGNOSIS — M6281 Muscle weakness (generalized): Secondary | ICD-10-CM

## 2023-10-11 DIAGNOSIS — R262 Difficulty in walking, not elsewhere classified: Secondary | ICD-10-CM | POA: Diagnosis not present

## 2023-10-11 DIAGNOSIS — R278 Other lack of coordination: Secondary | ICD-10-CM | POA: Diagnosis not present

## 2023-10-11 DIAGNOSIS — I890 Lymphedema, not elsewhere classified: Secondary | ICD-10-CM

## 2023-10-11 DIAGNOSIS — M5459 Other low back pain: Secondary | ICD-10-CM

## 2023-10-11 NOTE — Therapy (Signed)
 OUTPATIENT OCCUPATIONAL THERAPY TREATMENT NOTE  BILATERAL LOWER EXTREMITY LYMPHEDEMA  Patient Name: Judith Arnold MRN: [id number] DOB:Jun 02, 1950, 73 y.o., female Today's Date: 10/11/2023  REPORTING PERIOD:   END OF SESSION:   OT End of Session - 10/11/23 1453     Visit Number 21    Number of Visits 36    Date for OT Re-Evaluation 10/10/23    OT Start Time 0205    OT Stop Time 0245    OT Time Calculation (min) 40 min    Equipment Utilized During Treatment sock donner; compression garment samples    Activity Tolerance Patient tolerated treatment well;No increased pain    Behavior During Therapy WFL for tasks assessed/performed           Past Medical History:  Diagnosis Date   Allergy    Arthritis    lower back, left shoulder   Asthma    Carpal tunnel syndrome on both sides    Dental bridge present    top   Dental crown present    multiple   Family history of adverse reaction to anesthesia    Father and sister - PONV   GERD (gastroesophageal reflux disease)    Glaucoma    Hypertension    Leaky heart valve    Pre-diabetes    Sleep apnea    Past Surgical History:  Procedure Laterality Date   CATARACT EXTRACTION W/PHACO Right 11/16/2016   Procedure: CATARACT EXTRACTION PHACO AND INTRAOCULAR LENS PLACEMENT (IOC)  right;  Surgeon: Mittie Gaskin, MD;  Location: [facility];  Service: Ophthalmology;  Laterality: Right;  pre diabetic latex sensitivity sleep apnea   CATARACT EXTRACTION W/PHACO Left 01/18/2017   Procedure: CATARACT EXTRACTION PHACO AND INTRAOCULAR LENS PLACEMENT (IOC) LEFT DIABETIC;  Surgeon: Mittie Gaskin, MD;  Location: [facility];  Service: Ophthalmology;  Laterality: Left;   COLONOSCOPY     COLONOSCOPY WITH PROPOFOL  N/A 04/09/2018   Procedure: COLONOSCOPY WITH PROPOFOL ;  Surgeon: Viktoria Lamar DASEN, MD;  Location: [facility];  Service: Endoscopy;  Laterality: N/A;   IRIDOTOMY / IRIDECTOMY Bilateral 2005   TOOTH  EXTRACTION     Patient Active Problem List   Diagnosis Date Noted   Chronic bilateral low back pain with right-sided sciatica 09/29/2023   Hypertension associated with diabetes (HCC) 01/20/2023   Combined hyperlipidemia associated with type 2 diabetes mellitus (HCC) 01/20/2023   Intertrigo 11/01/2022   Stasis dermatitis of both legs 10/21/2022   Leg swelling 10/21/2022   Lymphedema of lower extremity 10/21/2022   Type 2 diabetes mellitus without complication, without long-term current use of insulin (HCC) 05/24/2022   Mixed hyperlipidemia 05/24/2022   Essential hypertension, benign 05/24/2022   Absolute anemia 05/24/2022   Gastroesophageal reflux disease without esophagitis 05/24/2022   Allergic rhinitis 05/24/2022   Asthma, allergic, moderate persistent, uncomplicated 05/24/2022   Bilateral chronic angle-closure glaucoma, indeterminate stage 05/24/2022   Airway hyperreactivity 08/29/2013   OSA on CPAP 08/29/2013    PCP: Fredy GORMAN Bathe, MD  REFERRING PROVIDER: same  REFERRING DIAG: lymphedema I89.0  THERAPY DIAG:  Lymphedema, not elsewhere classified  Rationale for Evaluation and Treatment: Rehabilitation  ONSET DATE: Pt reports onset of L ankle / leg swelling after injury 20-30 yrs ago. Then about 1 year ago legs started swelling and Pt having prickly sensation on shins and thighs bilaterally.   SUBJECTIVE:  SUBJECTIVE STATEMENT: Judith Arnold presents to OT for treatment of BLE lymphedema 2/2 suspected CVI and obesity. Pt is unaccompanied today. Pt missed last session due to a mix up with the date. Pt denies LE related pain today, but states her R leg seems more swollen.   (INITIAL EVAL 07/12/23 : Judith Arnold is referred to Occupational Therapy by Fredy GORMAN Bathe, MD,  for  evaluation and treatment of BLE lymphedema.  Pt reports onset of L ankle and distal leg swelling about 30 years ago, then noticed RLE swelling and worsening LLE swelling about 1 year ago at same time she started having unusual sensation in legs. Pt denies previously having lymphedema treatment. Pt reports paternal aunt had leg swelling. Pt is unable to wear compression stockings because she is unable to reach feet and distal legs to don and doff them due to back and hip pain.)  PERTINENT HISTORY: Relevant to lymphedema (OSA (denies using CPAP), BLE stasis dermatitis, HTN, DM 2 (Pt states she is prediabetic only), B glaucoma, Back and R hip OA  PAIN:  Are you having LE-related pain? Yes: NPRS scale: 0/10 Pain location: medial proximal thighs w abduction Pain description: like stickers Aggravating factors: standing, walking, dependent sitting Relieving factors: elevation  PRECAUTIONS: Fall and Other: LYMPHEDEMA precautions  (DM 2 and asthma)  WEIGHT BEARING RESTRICTIONS: No  FALLS:  Has patient fallen in last 6 months? yes Fell asleep at the computer and fell onto the floor [place] when storm door hit me  LIVING ENVIRONMENT: Lives with: alone Lives in: House/apartment Stairs: No;  Has following equipment at home: Environmental consultant - 4 wheeled  OCCUPATION: retired Scientist, research (life sciences)  LEISURE: reading and taking notes on current events and politics  HAND DOMINANCE: right   PRIOR LEVEL OF FUNCTION: Independent with household mobility with device, Requires assistive device for independence, Needs assistance with ADLs, Needs assistance with homemaking, and Needs assistance with gait  PATIENT GOALS: reduce swelling and keep it from getting worse  OBJECTIVE: Note: Objective measures were completed at Evaluation unless otherwise noted.  COGNITION:  Overall cognitive status: impaired short term  memory   OBSERVATIONS / OTHER ASSESSMENTS: Mild, Stage II, BLE Lymphedema 2/2 suspected venous  insufficiency.   SENSATION: Reports uncomfortable scratching, or sticker-like  sensation on anterior thighs   POSTURE: Raised walker handles 2 notches to increase upright spinal alignment when walking to limit falls risk. Handles need to be raised at least 1 hole more for safe upright posture when walking.  Upright sitting posture: head forward , shoulders forward and rounded- appears to be flexible kyphosis  LE ROM: WFL for ankles and knees. Pt reports limited hip abduction )  LE MMT: WFL FOR LYMPHEDEMA CARE. Presenting with generalized weakness and Pt reports debility  LYMPHEDEMA ASSESSMENTS:   SURGERY TYPE/DATE: Non-cancer related limb swelling  HX INFECTIONS: positive for 1 episode cellulitis treated w antibiotics  Hx WOUNDS: denies   BLE COMPARATIVE LIMB VOLUMETRICS: INITIAL 07/19/23  LANDMARK RIGHT (dominant)  R LEG (A-D) 3525.1 ml  R THIGH (E-G) ml  R FULL LIMB (A-G) ml  Limb Volume differential (LVD)  %  Volume change since initial %  Volume change overall V  (Blank rows = not tested)  LANDMARK LEFT    L LEG (A-D) 3769.6 ml  L THIGH (E-G) ml  L FULL LIMB (A-G) ml  Limb Volume differential (LVD)  Limb Volume Differential (LVD) measures 6.5%, L>R.  Volume change since initial %  Volume change overall %  RLE COMPARATIVE LIMB VOLUMETRICS: VISIT 9 08/15/23  LANDMARK RIGHT (dominant)  R LEG (A-D) 3549.4 ml  R THIGH (E-G) ml  R FULL LIMB (A-G) ml  Limb Volume differential (LVD)  %  Volume change since initial R LEG volume reduced by 5.84% since initially measured on 07/18/21  Volume change overall V  (Blank rows = not tested)  RLE COMPARATIVE LIMB VOLUMETRICS: VISIT 19 10/03/23  LANDMARK RIGHT (dominant)  R LEG (A-D) 3549.4 ml  R THIGH (E-G) ml  R FULL LIMB (A-G) ml  Limb Volume differential (LVD)  %  Volume change since initial R LEG volume reduced by 14.7% since initially measured on 07/18/21  Volume change overall V  (Blank rows = not tested)  Mild, Stage   II, Bilateral Lower Extremity Lymphedema 2/2 CVI and Obesity  Skin  Description Hyper-Keratosis Peau' de Orange Shiny Tight Fibrotic/ Indurated Fatty Doughy Spongy/ boggy       R>L x  x   Skin dry Flaky WNL Macerated   mildly      Color Redness Varicosities Blanching Hemosiderin Stain Mottled   x     x   Odor Malodorous Yeast Fungal infection  WNL      x   Temperature Warm Cool wnl    x     Pitting Edema   1+ 2+ 3+ 4+ Non-pitting         x   Girth Symmetrical Asymmetrical                   Distribution    R>L toes to groin    Stemmer Sign Positive Negative   +    Lymphorrhea History Of:  Present Absent     x    Wounds History Of Present Absent Venous Arterial Pressure Sheer     x        Signs of Infection Redness Warmth Erythema Acute Swelling Drainage Borders                    Sensation Light Touch Deep pressure Hypersensitivity   Present Impaired Present Impaired Absent Impaired   x Tactile  x  x     Nails WNL   Fungus nail dystrophy   x     Hair Growth Symmetrical Asymmetrical   x    Skin Creases Base of toes  Ankles   Base of Fingers knees       Abdominal pannus Thigh Lobules  Face/neck   x x  x      (Blank rows = not tested)    GAIT: Distance walked: >500' Assistive device utilized: Environmental consultant - 4 wheeled Level of assistance: Modified independence Comments: Pt bent at waist nearly 90 degrees with walker handles in lowest position  LYMPHEDEMA LIFE IMPACT SCALE (LLIS): TBA OT Rx visit 1  TREATMENT DATE:  RLE/RLQ MLD and skin care RLE multilayer compression bandaging Pt edu for lymphedema progress towards goals and self-care throughout session    PATIENT EDUCATION:  Continued Pt/ CG edu for lymphedema self care home program throughout session. Topics include outcome of comparative limb volumetrics- starting limb volume differentials (LVDs), technology and gradient techniques used for short stretch, multilayer compression wrapping,  simple self-MLD, therapeutic lymphatic pumping exercises, skin/nail care, LE precautions, compression garment recommendations and specifications, wear and care schedule and compression garment donning / doffing w assistive devices. Discussed progress towards all OT goals since commencing CDT. Discussed detrimental impact of obesity on lower and upper extremity lymphedema over  time. Reviewed OT goals for lymphedema care with Pt and discussed progress to date.  All questions answered to the Pt's satisfaction. Good return. Person educated: Patient  Education method: Explanation, Demonstration, and Handouts Education comprehension: verbalized understanding, returned demonstration, verbal cues required, and needs further education   HOME EXERCISE PROGRAM: BLE lymphatic pumping there ex using- 1 sets of 10 reps, each exercise in order-  1-2 x daily, bilaterally Simple self MLD 1 x daily Daily skin care to increase hydration, skin mobility and decrease infection risk- can be done during MLD During Intensive Phase CDT: Compression wraps 23/7 until limb volume reduction complete During Self-management Phase CDT: Fit with appropriate compression garments or alternatives. Consider BLE, knee length, Mediven, custom, CircAid , Velcro style leggings over soft cotton liners.  ASSESSMENT: CLINICAL IMPRESSION:  R LEG more swollen today than the last few visits. Pt reports she is 100% compliance with compression wraps between sessions. Extremely warm outdoor temperatures may be exacerbating Judith Arnold's condition, as with several ther patients at present. The summer heat wave has been ongoing for several weeks and is known to have  a negative impact on chronic swelling.Continued MLD with simultaneous skin care to RLE/RLQ today without pain. Re applied compression wraps to RLE as established and completed session. Cont as per POC.  (INITIAL EVAL 07/12/23: Judith Arnold is a 73 yo female presenting with  chronic, progressive, BLE swelling 2/2 unknown etiology, which at first glance appears to be circulatory in nature due to skin coloring at distal legs where swelling is most invested. Pt is presently unable to wear traditional elastic compression garments due to difficulty donning and doffing them.  Chronic, progressive lymphedema with associated skin changes, including fibrosis, limits this patent's functional performance in all occupational domains, including functional ambulation and mobility, basic and instrumental ADLs (lower body dressing, LB bathing, fitting street shoes and LB clothing, driving, shopping, and home management. It also limits perform productive activities, leisure pursuits, and participation in social and community activities. BLE lymphedema contributes to elevated infection risk. Pt will benefit from skilled OT for Complete Decongestive Therapy (CDT), which  typically includes manual lymphatic drainage (MLD), skin care to limit' infection risk and increase skin excursion, lymphatic pumping exercise, and during the Intensive Phase multilayer, gradient compression bandaging to reduce limb volume. Once limb volume is reduced as much as possible, Pt is fitted with appropriate compression garments and/ or devices and transitions into the self management phase of care consisting of follow long support PRN.    Pt understands that her fair prognosis will become poor without daily assistance with compression wrapping since she is unable to reach feet and legs to apply them herself. Pt assures OT that she has a friend she believes is willing to assist her between OT sessions. )   OBJECTIVE IMPAIRMENTS: decreased standing and walking tolerance, activity tolerance, decreased balance, decreased knowledge of condition, decreased knowledge of use of DME, decreased mobility, decreased ROM, decreased strength, increased edema, impaired flexibility, impaired sensation, impaired UE functional use, impaired  vision/reception, pain, and chronic, progressive, BLE swelling and associated skin changes, at increased infection risk   ACTIVITY LIMITATIONS: Functional ambulation and mobility (lifting, carrying, steps/stairs, transfers, squatting) , Basic and instrumental ADLs, leisure pursuits, productive activities, social participation  PARTICIPATION LIMITATIONS: meal prep, cleaning, laundry, driving, shopping, yard work, and    PERSONAL FACTORS: Age, Fitness, Past/current experiences, Time since onset of injury/illness/exacerbation, 2+ co morbidities: OSA, HTN,  are also affecting patient's functional outcome.   REHAB  POTENTIAL: Good  EVALUATION COMPLEXITY: Moderate   GOALS: Goals reviewed with patient? Yes  SHORT TERM GOALS: Target date: 4th OT Rx visit   Pt will demonstrate understanding of lymphedema precautions and prevention strategies with modified independence using a printed reference to identify at least 5 precautions and discussing how s/he may implement them into daily life to reduce risk of progression with extra time. Baseline:Max A Goal status: GOAL MET  2.  Pt will be able to apply multilayer, knee length, gradient, compression wraps to one leg at a time from toes to below knee with max caregiver assist to decrease limb volume, to limit infection risk, and to limit lymphedema progression.  Baseline: Dependent Goal status: GOAL MET  LONG TERM GOALS: Target date: 09/19/23  1.Given this patient's Intake score of TBA % on the Lymphedema Life Impact Scale (LLIS), patient will experience a reduction of at least 5 points in her perceived level of functional impairment resulting from lymphedema to improve functional performance and quality of life (QOL). Baseline: TBA % Goal status:  GOAL DEFERRED  2.  Pt will achieve at least a 10% volume reduction in B legs to return limb to typical size and shape, to limit infection risk and LE progression, to decrease pain, to improve  function. Baseline: Dependent Goal status: PROGRESSING. R LEG volume reduced by 14.7% since initially measured on 07/18/21  3.  Pt will obtain appropriate compression garments/devices and achieve modified independence (extra time + assistive devices) with donning/doffing to optimize limb volume reductions and limit LE progression over time. Baseline: Dependent Goal status: PROGRESSING  During Intensive phase CDT, with max CG assistance, Pt will achieve at least 85% compliance with all adapted lymphedema self-care home program components, including daily skin care, compression wraps and /or garments, simple self MLD and lymphatic pumping therex to habituate LE self care protocol  into ADLs for optimal LE self-management over time. Baseline: Dependent Goal status:PROGRESSING  PLAN:  OT FREQUENCY: 2 x/week  OT DURATION: 12 weeks  PLANNED INTERVENTIONS:compression bandaging, skin care,  97110-Therapeutic exercises, 97530- Therapeutic activity, 97535- Self Care, Manual lymph drainage, DME instructions, and fit with appropriate compression  PLAN FOR NEXT SESSION:  Measure R leg to assess progress towards a stocking, or some alternative Cont Pt edu for LE self-care   Zebedee Dec, MS, OTR/L, CLT-LANA 10/11/23 2:55 PM

## 2023-10-16 ENCOUNTER — Ambulatory Visit: Attending: Internal Medicine | Admitting: Occupational Therapy

## 2023-10-16 DIAGNOSIS — M5459 Other low back pain: Secondary | ICD-10-CM | POA: Insufficient documentation

## 2023-10-16 DIAGNOSIS — I890 Lymphedema, not elsewhere classified: Secondary | ICD-10-CM | POA: Diagnosis not present

## 2023-10-16 DIAGNOSIS — R278 Other lack of coordination: Secondary | ICD-10-CM | POA: Insufficient documentation

## 2023-10-16 DIAGNOSIS — M25551 Pain in right hip: Secondary | ICD-10-CM | POA: Diagnosis not present

## 2023-10-16 DIAGNOSIS — M6281 Muscle weakness (generalized): Secondary | ICD-10-CM | POA: Insufficient documentation

## 2023-10-16 DIAGNOSIS — R262 Difficulty in walking, not elsewhere classified: Secondary | ICD-10-CM | POA: Diagnosis not present

## 2023-10-16 NOTE — Therapy (Signed)
 OUTPATIENT OCCUPATIONAL THERAPY TREATMENT NOTE  BILATERAL LOWER EXTREMITY LYMPHEDEMA  Patient Name: Judith Arnold MRN: [id number] DOB:[birth year], 73 y.o., female Today's Date: 10/16/2023  REPORTING PERIOD:   END OF SESSION:   OT End of Session - 10/16/23 1110     Visit Number 22    Number of Visits 36    Date for OT Re-Evaluation 10/10/23    OT Start Time 1003    OT Stop Time 1105    OT Time Calculation (min) 62 min    Equipment Utilized During Treatment sock donner; compression garment samples    Activity Tolerance Patient tolerated treatment well;No increased pain    Behavior During Therapy WFL for tasks assessed/performed           Past Medical History:  Diagnosis Date   Allergy    Arthritis    lower back, left shoulder   Asthma    Carpal tunnel syndrome on both sides    Dental bridge present    top   Dental crown present    multiple   Family history of adverse reaction to anesthesia    Father and sister - PONV   GERD (gastroesophageal reflux disease)    Glaucoma    Hypertension    Leaky heart valve    Pre-diabetes    Sleep apnea    Past Surgical History:  Procedure Laterality Date   CATARACT EXTRACTION W/PHACO Right 11/16/2016   Procedure: CATARACT EXTRACTION PHACO AND INTRAOCULAR LENS PLACEMENT (IOC)  right;  Surgeon: Mittie Gaskin, MD;  Location: [facility];  Service: Ophthalmology;  Laterality: Right;  pre diabetic latex sensitivity sleep apnea   CATARACT EXTRACTION W/PHACO Left 01/18/2017   Procedure: CATARACT EXTRACTION PHACO AND INTRAOCULAR LENS PLACEMENT (IOC) LEFT DIABETIC;  Surgeon: Mittie Gaskin, MD;  Location: [facility];  Service: Ophthalmology;  Laterality: Left;   COLONOSCOPY     COLONOSCOPY WITH PROPOFOL  N/A 04/09/2018   Procedure: COLONOSCOPY WITH PROPOFOL ;  Surgeon: Viktoria Lamar DASEN, MD;  Location: [facility];  Service: Endoscopy;  Laterality: N/A;   IRIDOTOMY / IRIDECTOMY Bilateral 2005   TOOTH  EXTRACTION     Patient Active Problem List   Diagnosis Date Noted   Chronic bilateral low back pain with right-sided sciatica 09/29/2023   Hypertension associated with diabetes (HCC) 01/20/2023   Combined hyperlipidemia associated with type 2 diabetes mellitus (HCC) 01/20/2023   Intertrigo 11/01/2022   Stasis dermatitis of both legs 10/21/2022   Leg swelling 10/21/2022   Lymphedema of lower extremity 10/21/2022   Type 2 diabetes mellitus without complication, without long-term current use of insulin (HCC) 05/24/2022   Mixed hyperlipidemia 05/24/2022   Essential hypertension, benign 05/24/2022   Absolute anemia 05/24/2022   Gastroesophageal reflux disease without esophagitis 05/24/2022   Allergic rhinitis 05/24/2022   Asthma, allergic, moderate persistent, uncomplicated 05/24/2022   Bilateral chronic angle-closure glaucoma, indeterminate stage 05/24/2022   Airway hyperreactivity 08/29/2013   OSA on CPAP 08/29/2013    PCP: Fredy GORMAN Bathe, MD  REFERRING PROVIDER: same  REFERRING DIAG: lymphedema I89.0  THERAPY DIAG:  Lymphedema, not elsewhere classified  Rationale for Evaluation and Treatment: Rehabilitation  ONSET DATE: Pt reports onset of L ankle / leg swelling after injury 20-30 yrs ago. Then about 1 year ago legs started swelling and Pt having prickly sensation on shins and thighs bilaterally.   SUBJECTIVE:  SUBJECTIVE STATEMENT: Judith Arnold presents to OT for treatment of BLE lymphedema 2/2 suspected CVI and obesity. Pt is unaccompanied today. Pt denies LE related pain today. She brings clean wraps to clinic rolled and ready to re apply. Pt has no new concerns. She tells me she has not heard from the DME provider re cost of her garments/. OT emailed vendor and requested to contact  Pt.  (INITIAL EVAL 07/12/23 : Judith Arnold is referred to Occupational Therapy by Fredy GORMAN Bathe, MD,  for evaluation and treatment of BLE lymphedema.  Pt reports onset of L ankle and distal leg swelling about 30 years ago, then noticed RLE swelling and worsening LLE swelling about 1 year ago at same time she started having unusual sensation in legs. Pt denies previously having lymphedema treatment. Pt reports paternal aunt had leg swelling. Pt is unable to wear compression stockings because she is unable to reach feet and distal legs to don and doff them due to back and hip pain.)  PERTINENT HISTORY: Relevant to lymphedema (OSA (denies using CPAP), BLE stasis dermatitis, HTN, DM 2 (Pt states she is prediabetic only), B glaucoma, Back and R hip OA  PAIN:  Are you having LE-related pain? Yes: NPRS scale: 0/10 Pain location: medial proximal thighs w abduction Pain description: like stickers Aggravating factors: standing, walking, dependent sitting Relieving factors: elevation  PRECAUTIONS: Fall and Other: LYMPHEDEMA precautions  (DM 2 and asthma)  WEIGHT BEARING RESTRICTIONS: No  FALLS:  Has patient fallen in last 6 months? yes Fell asleep at the computer and fell onto the floor [place] when storm door hit me  LIVING ENVIRONMENT: Lives with: alone Lives in: House/apartment Stairs: No;  Has following equipment at home: Environmental consultant - 4 wheeled  OCCUPATION: retired Scientist, research (life sciences)  LEISURE: reading and taking notes on current events and politics  HAND DOMINANCE: right   PRIOR LEVEL OF FUNCTION: Independent with household mobility with device, Requires assistive device for independence, Needs assistance with ADLs, Needs assistance with homemaking, and Needs assistance with gait  PATIENT GOALS: reduce swelling and keep it from getting worse  OBJECTIVE: Note: Objective measures were completed at Evaluation unless otherwise noted.  COGNITION:  Overall cognitive status:  impaired short term  memory   OBSERVATIONS / OTHER ASSESSMENTS: Mild, Stage II, BLE Lymphedema 2/2 suspected venous insufficiency.   SENSATION: Reports uncomfortable scratching, or sticker-like  sensation on anterior thighs   POSTURE: Raised walker handles 2 notches to increase upright spinal alignment when walking to limit falls risk. Handles need to be raised at least 1 hole more for safe upright posture when walking.  Upright sitting posture: head forward , shoulders forward and rounded- appears to be flexible kyphosis  LE ROM: WFL for ankles and knees. Pt reports limited hip abduction )  LE MMT: WFL FOR LYMPHEDEMA CARE. Presenting with generalized weakness and Pt reports debility  LYMPHEDEMA ASSESSMENTS:   SURGERY TYPE/DATE: Non-cancer related limb swelling  HX INFECTIONS: positive for 1 episode cellulitis treated w antibiotics  Hx WOUNDS: denies   BLE COMPARATIVE LIMB VOLUMETRICS: INITIAL 07/19/23  LANDMARK RIGHT (dominant)  R LEG (A-D) 3525.1 ml  R THIGH (E-G) ml  R FULL LIMB (A-G) ml  Limb Volume differential (LVD)  %  Volume change since initial %  Volume change overall V  (Blank rows = not tested)  LANDMARK LEFT    L LEG (A-D) 3769.6 ml  L THIGH (E-G) ml  L FULL LIMB (A-G) ml  Limb Volume differential (LVD)  Limb Volume Differential (LVD) measures 6.5%, L>R.  Volume change since initial %  Volume change overall %    RLE COMPARATIVE LIMB VOLUMETRICS: VISIT 9 08/15/23  LANDMARK RIGHT (dominant)  R LEG (A-D) 3549.4 ml  R THIGH (E-G) ml  R FULL LIMB (A-G) ml  Limb Volume differential (LVD)  %  Volume change since initial R LEG volume reduced by 5.84% since initially measured on 07/18/21  Volume change overall V  (Blank rows = not tested)  RLE COMPARATIVE LIMB VOLUMETRICS: VISIT 19 10/03/23  LANDMARK RIGHT (dominant)  R LEG (A-D) 3549.4 ml  R THIGH (E-G) ml  R FULL LIMB (A-G) ml  Limb Volume differential (LVD)  %  Volume change since initial R LEG volume  reduced by 14.7% since initially measured on 07/18/21  Volume change overall V  (Blank rows = not tested)  Mild, Stage  II, Bilateral Lower Extremity Lymphedema 2/2 CVI and Obesity  Skin  Description Hyper-Keratosis Peau' de Orange Shiny Tight Fibrotic/ Indurated Fatty Doughy Spongy/ boggy       R>L x  x   Skin dry Flaky WNL Macerated   mildly      Color Redness Varicosities Blanching Hemosiderin Stain Mottled   x     x   Odor Malodorous Yeast Fungal infection  WNL      x   Temperature Warm Cool wnl    x     Pitting Edema   1+ 2+ 3+ 4+ Non-pitting         x   Girth Symmetrical Asymmetrical                   Distribution    R>L toes to groin    Stemmer Sign Positive Negative   +    Lymphorrhea History Of:  Present Absent     x    Wounds History Of Present Absent Venous Arterial Pressure Sheer     x        Signs of Infection Redness Warmth Erythema Acute Swelling Drainage Borders                    Sensation Light Touch Deep pressure Hypersensitivity   Present Impaired Present Impaired Absent Impaired   x Tactile  x  x     Nails WNL   Fungus nail dystrophy   x     Hair Growth Symmetrical Asymmetrical   x    Skin Creases Base of toes  Ankles   Base of Fingers knees       Abdominal pannus Thigh Lobules  Face/neck   x x  x      (Blank rows = not tested)    GAIT: Distance walked: >500' Assistive device utilized: Environmental consultant - 4 wheeled Level of assistance: Modified independence Comments: Pt bent at waist nearly 90 degrees with walker handles in lowest position  LYMPHEDEMA LIFE IMPACT SCALE (LLIS): TBA OT Rx visit 1  TREATMENT DATE:  RLE/RLQ MLD and simultaneous skin care RLE multilayer compression bandaging Pt edu for lymphedema progress towards goals and self-care    PATIENT EDUCATION:  Continued Pt/ CG edu for lymphedema self care home program throughout session. Topics include outcome of comparative limb volumetrics- starting limb volume  differentials (LVDs), technology and gradient techniques used for short stretch, multilayer compression wrapping, simple self-MLD, therapeutic lymphatic pumping exercises, skin/nail care, LE precautions, compression garment recommendations and specifications, wear and care schedule and compression garment donning / doffing w assistive devices. Discussed  progress towards all OT goals since commencing CDT. Discussed detrimental impact of obesity on lower and upper extremity lymphedema over time. Reviewed OT goals for lymphedema care with Pt and discussed progress to date.  All questions answered to the Pt's satisfaction. Good return. Person educated: Patient  Education method: Explanation, Demonstration, and Handouts Education comprehension: verbalized understanding, returned demonstration, verbal cues required, and needs further education   HOME EXERCISE PROGRAM: BLE lymphatic pumping there ex using- 1 sets of 10 reps, each exercise in order-  1-2 x daily, bilaterally Simple self MLD 1 x daily Daily skin care to increase hydration, skin mobility and decrease infection risk- can be done during MLD During Intensive Phase CDT: Compression wraps 23/7 until limb volume reduction complete During Self-management Phase CDT: Fit with appropriate compression garments or alternatives. Consider BLE, knee length, Mediven, custom, CircAid , Velcro style leggings over soft cotton liners.  ASSESSMENT: CLINICAL IMPRESSION:  Continued MLD with simultaneous skin care to RLE/RLQ today without pain. Re applied compression wraps to RLE as established and completed session. Garment alternatives are on order. Awaiting DME vendor to contact Pt so order and fitting can proceed. Cont as per POC.  (INITIAL EVAL 07/12/23: Judith Arnold is a 73 yo female presenting with chronic, progressive, BLE swelling 2/2 unknown etiology, which at first glance appears to be circulatory in nature due to skin coloring at distal legs  where swelling is most invested. Pt is presently unable to wear traditional elastic compression garments due to difficulty donning and doffing them.  Chronic, progressive lymphedema with associated skin changes, including fibrosis, limits this patent's functional performance in all occupational domains, including functional ambulation and mobility, basic and instrumental ADLs (lower body dressing, LB bathing, fitting street shoes and LB clothing, driving, shopping, and home management. It also limits perform productive activities, leisure pursuits, and participation in social and community activities. BLE lymphedema contributes to elevated infection risk. Pt will benefit from skilled OT for Complete Decongestive Therapy (CDT), which  typically includes manual lymphatic drainage (MLD), skin care to limit' infection risk and increase skin excursion, lymphatic pumping exercise, and during the Intensive Phase multilayer, gradient compression bandaging to reduce limb volume. Once limb volume is reduced as much as possible, Pt is fitted with appropriate compression garments and/ or devices and transitions into the self management phase of care consisting of follow long support PRN.    Pt understands that her fair prognosis will become poor without daily assistance with compression wrapping since she is unable to reach feet and legs to apply them herself. Pt assures OT that she has a friend she believes is willing to assist her between OT sessions. )   OBJECTIVE IMPAIRMENTS: decreased standing and walking tolerance, activity tolerance, decreased balance, decreased knowledge of condition, decreased knowledge of use of DME, decreased mobility, decreased ROM, decreased strength, increased edema, impaired flexibility, impaired sensation, impaired UE functional use, impaired vision/reception, pain, and chronic, progressive, BLE swelling and associated skin changes, at increased infection risk   ACTIVITY LIMITATIONS:  Functional ambulation and mobility (lifting, carrying, steps/stairs, transfers, squatting) , Basic and instrumental ADLs, leisure pursuits, productive activities, social participation  PARTICIPATION LIMITATIONS: meal prep, cleaning, laundry, driving, shopping, yard work, and    PERSONAL FACTORS: Age, Fitness, Past/current experiences, Time since onset of injury/illness/exacerbation, 2+ co morbidities: OSA, HTN,  are also affecting patient's functional outcome.   REHAB POTENTIAL: Good  EVALUATION COMPLEXITY: Moderate   GOALS: Goals reviewed with patient? Yes  SHORT TERM GOALS: Target date: 4th OT  Rx visit   Pt will demonstrate understanding of lymphedema precautions and prevention strategies with modified independence using a printed reference to identify at least 5 precautions and discussing how s/he may implement them into daily life to reduce risk of progression with extra time. Baseline:Max A Goal status: GOAL MET  2.  Pt will be able to apply multilayer, knee length, gradient, compression wraps to one leg at a time from toes to below knee with max caregiver assist to decrease limb volume, to limit infection risk, and to limit lymphedema progression.  Baseline: Dependent Goal status: GOAL MET  LONG TERM GOALS: Target date: 09/19/23  1.Given this patient's Intake score of TBA % on the Lymphedema Life Impact Scale (LLIS), patient will experience a reduction of at least 5 points in her perceived level of functional impairment resulting from lymphedema to improve functional performance and quality of life (QOL). Baseline: TBA % Goal status:  GOAL DEFERRED  2.  Pt will achieve at least a 10% volume reduction in B legs to return limb to typical size and shape, to limit infection risk and LE progression, to decrease pain, to improve function. Baseline: Dependent Goal status: PROGRESSING. R LEG volume reduced by 14.7% since initially measured on 07/18/21  3.  Pt will obtain appropriate  compression garments/devices and achieve modified independence (extra time + assistive devices) with donning/doffing to optimize limb volume reductions and limit LE progression over time. Baseline: Dependent Goal status: PROGRESSING  During Intensive phase CDT, with max CG assistance, Pt will achieve at least 85% compliance with all adapted lymphedema self-care home program components, including daily skin care, compression wraps and /or garments, simple self MLD and lymphatic pumping therex to habituate LE self care protocol  into ADLs for optimal LE self-management over time. Baseline: Dependent Goal status:PROGRESSING  PLAN:  OT FREQUENCY: 2 x/week  OT DURATION: 12 weeks  PLANNED INTERVENTIONS:compression bandaging, skin care,  97110-Therapeutic exercises, 97530- Therapeutic activity, 97535- Self Care, Manual lymph drainage, DME instructions, and fit with appropriate compression  PLAN FOR NEXT SESSION:  Measure R leg to assess progress towards a stocking, or some alternative Cont Pt edu for LE self-care   Zebedee Dec, MS, OTR/L, CLT-LANA 10/16/23 11:12 AM

## 2023-10-18 ENCOUNTER — Ambulatory Visit: Admitting: Occupational Therapy

## 2023-10-18 ENCOUNTER — Ambulatory Visit

## 2023-10-18 DIAGNOSIS — R278 Other lack of coordination: Secondary | ICD-10-CM | POA: Diagnosis not present

## 2023-10-18 DIAGNOSIS — I890 Lymphedema, not elsewhere classified: Secondary | ICD-10-CM | POA: Diagnosis not present

## 2023-10-18 DIAGNOSIS — R262 Difficulty in walking, not elsewhere classified: Secondary | ICD-10-CM | POA: Diagnosis not present

## 2023-10-18 DIAGNOSIS — M6281 Muscle weakness (generalized): Secondary | ICD-10-CM

## 2023-10-18 DIAGNOSIS — M25551 Pain in right hip: Secondary | ICD-10-CM

## 2023-10-18 DIAGNOSIS — M5459 Other low back pain: Secondary | ICD-10-CM

## 2023-10-18 NOTE — Therapy (Signed)
 OUTPATIENT OCCUPATIONAL THERAPY TREATMENT NOTE  BILATERAL LOWER EXTREMITY LYMPHEDEMA  Patient Name: Judith Arnold MRN: [id number] DOB:May 13, 1950, 73 y.o., female Today's Date: 10/18/2023  REPORTING PERIOD:   END OF SESSION:   OT End of Session - 10/18/23 1315     Visit Number 23    Number of Visits 36    Date for OT Re-Evaluation 01/16/24    OT Start Time 0105    OT Stop Time 0201    OT Time Calculation (min) 56 min    Equipment Utilized During Treatment sock donner; compression garment samples    Activity Tolerance Patient tolerated treatment well;No increased pain    Behavior During Therapy WFL for tasks assessed/performed           Past Medical History:  Diagnosis Date   Allergy    Arthritis    lower back, left shoulder   Asthma    Carpal tunnel syndrome on both sides    Dental bridge present    top   Dental crown present    multiple   Family history of adverse reaction to anesthesia    Father and sister - PONV   GERD (gastroesophageal reflux disease)    Glaucoma    Hypertension    Leaky heart valve    Pre-diabetes    Sleep apnea    Past Surgical History:  Procedure Laterality Date   CATARACT EXTRACTION W/PHACO Right 11/16/2016   Procedure: CATARACT EXTRACTION PHACO AND INTRAOCULAR LENS PLACEMENT (IOC)  right;  Surgeon: Mittie Gaskin, MD;  Location: [facility];  Service: Ophthalmology;  Laterality: Right;  pre diabetic latex sensitivity sleep apnea   CATARACT EXTRACTION W/PHACO Left 01/18/2017   Procedure: CATARACT EXTRACTION PHACO AND INTRAOCULAR LENS PLACEMENT (IOC) LEFT DIABETIC;  Surgeon: Mittie Gaskin, MD;  Location: [facility];  Service: Ophthalmology;  Laterality: Left;   COLONOSCOPY     COLONOSCOPY WITH PROPOFOL  N/A 04/09/2018   Procedure: COLONOSCOPY WITH PROPOFOL ;  Surgeon: Viktoria Lamar DASEN, MD;  Location: [facility];  Service: Endoscopy;  Laterality: N/A;   IRIDOTOMY / IRIDECTOMY Bilateral 2005   TOOTH  EXTRACTION     Patient Active Problem List   Diagnosis Date Noted   Chronic bilateral low back pain with right-sided sciatica 09/29/2023   Hypertension associated with diabetes (HCC) 01/20/2023   Combined hyperlipidemia associated with type 2 diabetes mellitus (HCC) 01/20/2023   Intertrigo 11/01/2022   Stasis dermatitis of both legs 10/21/2022   Leg swelling 10/21/2022   Lymphedema of lower extremity 10/21/2022   Type 2 diabetes mellitus without complication, without long-term current use of insulin (HCC) 05/24/2022   Mixed hyperlipidemia 05/24/2022   Essential hypertension, benign 05/24/2022   Absolute anemia 05/24/2022   Gastroesophageal reflux disease without esophagitis 05/24/2022   Allergic rhinitis 05/24/2022   Asthma, allergic, moderate persistent, uncomplicated 05/24/2022   Bilateral chronic angle-closure glaucoma, indeterminate stage 05/24/2022   Airway hyperreactivity 08/29/2013   OSA on CPAP 08/29/2013    PCP: Fredy GORMAN Bathe, MD  REFERRING PROVIDER: same  REFERRING DIAG: lymphedema I89.0  THERAPY DIAG:  Lymphedema, not elsewhere classified  Rationale for Evaluation and Treatment: Rehabilitation  ONSET DATE: Pt reports onset of L ankle / leg swelling after injury 20-30 yrs ago. Then about 1 year ago legs started swelling and Pt having prickly sensation on shins and thighs bilaterally.   SUBJECTIVE:  SUBJECTIVE STATEMENT: Judith Arnold presents to OT for treatment of BLE lymphedema 2/2 suspected CVI and obesity. Pt is unaccompanied today. Pt denies LE related pain today. She brings clean wraps to clinic rolled and ready to re apply. Pt has no new concerns. She tells me she still has not heard from the DME provider re cost of her garments.    (INITIAL EVAL 07/12/23 : Judith Arnold is referred to Occupational Therapy by Fredy GORMAN Bathe, MD,  for evaluation and treatment of BLE lymphedema.  Pt reports onset of L ankle and distal leg swelling about 30 years ago, then noticed RLE swelling and worsening LLE swelling about 1 year ago at same time she started having unusual sensation in legs. Pt denies previously having lymphedema treatment. Pt reports paternal aunt had leg swelling. Pt is unable to wear compression stockings because she is unable to reach feet and distal legs to don and doff them due to back and hip pain.  PERTINENT HISTORY: Relevant to lymphedema (OSA (denies using CPAP), BLE stasis dermatitis, HTN, DM 2 (Pt states she is prediabetic only), B glaucoma, Back and R hip OA  PAIN:  Are you having LE-related pain? Yes: NPRS scale: 0/10 Pain location: medial proximal thighs w abduction Pain description: like stickers Aggravating factors: standing, walking, dependent sitting Relieving factors: elevation  PRECAUTIONS: Fall and Other: LYMPHEDEMA precautions  (DM 2 and asthma)  WEIGHT BEARING RESTRICTIONS: No  FALLS:  Has patient fallen in last 6 months? yes Fell asleep at the computer and fell onto the floor [place] when storm door hit me  LIVING ENVIRONMENT: Lives with: alone Lives in: House/apartment Stairs: No;  Has following equipment at home: Environmental consultant - 4 wheeled  OCCUPATION: retired Scientist, research (life sciences)  LEISURE: reading and taking notes on current events and politics  HAND DOMINANCE: right   PRIOR LEVEL OF FUNCTION: Independent with household mobility with device, Requires assistive device for independence, Needs assistance with ADLs, Needs assistance with homemaking, and Needs assistance with gait  PATIENT GOALS: reduce swelling and keep it from getting worse  OBJECTIVE: Note: Objective measures were completed at Evaluation unless otherwise noted.  COGNITION:  Overall cognitive status: impaired short term  memory   OBSERVATIONS / OTHER  ASSESSMENTS: Mild, Stage II, BLE Lymphedema 2/2 suspected venous insufficiency.   SENSATION: Reports uncomfortable scratching, or sticker-like  sensation on anterior thighs   POSTURE: Raised walker handles 2 notches to increase upright spinal alignment when walking to limit falls risk. Handles need to be raised at least 1 hole more for safe upright posture when walking.  Upright sitting posture: head forward , shoulders forward and rounded- appears to be flexible kyphosis  LE ROM: WFL for ankles and knees. Pt reports limited hip abduction )  LE MMT: WFL FOR LYMPHEDEMA CARE. Presenting with generalized weakness and Pt reports debility  LYMPHEDEMA ASSESSMENTS:   SURGERY TYPE/DATE: Non-cancer related limb swelling  HX INFECTIONS: positive for 1 episode cellulitis treated w antibiotics  Hx WOUNDS: denies   BLE COMPARATIVE LIMB VOLUMETRICS: INITIAL 07/19/23  LANDMARK RIGHT (dominant)  R LEG (A-D) 3525.1 ml  R THIGH (E-G) ml  R FULL LIMB (A-G) ml  Limb Volume differential (LVD)  %  Volume change since initial %  Volume change overall V  (Blank rows = not tested)  LANDMARK LEFT    L LEG (A-D) 3769.6 ml  L THIGH (E-G) ml  L FULL LIMB (A-G) ml  Limb Volume differential (LVD)  Limb Volume Differential (LVD) measures  6.5%, L>R.  Volume change since initial %  Volume change overall %    RLE COMPARATIVE LIMB VOLUMETRICS: VISIT 9 08/15/23  LANDMARK RIGHT (dominant)  R LEG (A-D) 3549.4 ml  R THIGH (E-G) ml  R FULL LIMB (A-G) ml  Limb Volume differential (LVD)  %  Volume change since initial R LEG volume reduced by 5.84% since initially measured on 07/18/21  Volume change overall V  (Blank rows = not tested)  RLE COMPARATIVE LIMB VOLUMETRICS: VISIT 19 10/03/23  LANDMARK RIGHT (dominant)  R LEG (A-D) 3549.4 ml  R THIGH (E-G) ml  R FULL LIMB (A-G) ml  Limb Volume differential (LVD)  %  Volume change since initial R LEG volume reduced by 14.7% since initially measured on 07/18/21   Volume change overall V  (Blank rows = not tested)  Mild, Stage  II, Bilateral Lower Extremity Lymphedema 2/2 CVI and Obesity  Skin  Description Hyper-Keratosis Peau' de Orange Shiny Tight Fibrotic/ Indurated Fatty Doughy Spongy/ boggy       R>L x  x   Skin dry Flaky WNL Macerated   mildly      Color Redness Varicosities Blanching Hemosiderin Stain Mottled   x     x   Odor Malodorous Yeast Fungal infection  WNL      x   Temperature Warm Cool wnl    x     Pitting Edema   1+ 2+ 3+ 4+ Non-pitting         x   Girth Symmetrical Asymmetrical                   Distribution    R>L toes to groin    Stemmer Sign Positive Negative   +    Lymphorrhea History Of:  Present Absent     x    Wounds History Of Present Absent Venous Arterial Pressure Sheer     x        Signs of Infection Redness Warmth Erythema Acute Swelling Drainage Borders                    Sensation Light Touch Deep pressure Hypersensitivity   Present Impaired Present Impaired Absent Impaired   x Tactile  x  x     Nails WNL   Fungus nail dystrophy   x     Hair Growth Symmetrical Asymmetrical   x    Skin Creases Base of toes  Ankles   Base of Fingers knees       Abdominal pannus Thigh Lobules  Face/neck   x x  x      (Blank rows = not tested)    GAIT: Distance walked: >500' Assistive device utilized: Environmental consultant - 4 wheeled Level of assistance: Modified independence Comments: Pt bent at waist nearly 90 degrees with walker handles in lowest position  LYMPHEDEMA LIFE IMPACT SCALE (LLIS): TBA OT Rx visit 1  TREATMENT DATE:  RLE/RLQ MLD and simultaneous skin care RLE multilayer compression bandaging Pt edu for lymphedema progress towards goals and self-care    PATIENT EDUCATION:  Continued Pt/ CG edu for lymphedema self care home program throughout session. Topics include outcome of comparative limb volumetrics- starting limb volume differentials (LVDs), technology and gradient  techniques used for short stretch, multilayer compression wrapping, simple self-MLD, therapeutic lymphatic pumping exercises, skin/nail care, LE precautions, compression garment recommendations and specifications, wear and care schedule and compression garment donning / doffing w assistive devices. Discussed progress towards all OT goals  since commencing CDT. Discussed detrimental impact of obesity on lower and upper extremity lymphedema over time. Reviewed OT goals for lymphedema care with Pt and discussed progress to date.  All questions answered to the Pt's satisfaction. Good return. Person educated: Patient  Education method: Explanation, Demonstration, and Handouts Education comprehension: verbalized understanding, returned demonstration, verbal cues required, and needs further education   HOME EXERCISE PROGRAM: BLE lymphatic pumping there ex using- 1 sets of 10 reps, each exercise in order-  1-2 x daily, bilaterally Simple self MLD 1 x daily Daily skin care to increase hydration, skin mobility and decrease infection risk- can be done during MLD During Intensive Phase CDT: Compression wraps 23/7 until limb volume reduction complete During Self-management Phase CDT: Fit with appropriate compression garments or alternatives. Consider BLE, knee length, Mediven, custom, CircAid , Velcro style leggings over soft cotton liners.  ASSESSMENT: CLINICAL IMPRESSION:  Continued MLD with simultaneous skin care to RLE/RLQ today without pain. Re applied compression wraps to RLE as established and completed session. Garment alternatives are on order. Awaiting DME vendor to contact Pt so order and fitting can proceed. Cont as per POC.  (INITIAL EVAL 07/12/23: Judith Arnold is a 73 yo female presenting with chronic, progressive, BLE swelling 2/2 unknown etiology, which at first glance appears to be circulatory in nature due to skin coloring at distal legs where swelling is most invested. Pt is presently  unable to wear traditional elastic compression garments due to difficulty donning and doffing them.  Chronic, progressive lymphedema with associated skin changes, including fibrosis, limits this patent's functional performance in all occupational domains, including functional ambulation and mobility, basic and instrumental ADLs (lower body dressing, LB bathing, fitting street shoes and LB clothing, driving, shopping, and home management. It also limits perform productive activities, leisure pursuits, and participation in social and community activities. BLE lymphedema contributes to elevated infection risk. Pt will benefit from skilled OT for Complete Decongestive Therapy (CDT), which  typically includes manual lymphatic drainage (MLD), skin care to limit' infection risk and increase skin excursion, lymphatic pumping exercise, and during the Intensive Phase multilayer, gradient compression bandaging to reduce limb volume. Once limb volume is reduced as much as possible, Pt is fitted with appropriate compression garments and/ or devices and transitions into the self management phase of care consisting of follow long support PRN.    Pt understands that her fair prognosis will become poor without daily assistance with compression wrapping since she is unable to reach feet and legs to apply them herself. Pt assures OT that she has a friend she believes is willing to assist her between OT sessions. )   OBJECTIVE IMPAIRMENTS: decreased standing and walking tolerance, activity tolerance, decreased balance, decreased knowledge of condition, decreased knowledge of use of DME, decreased mobility, decreased ROM, decreased strength, increased edema, impaired flexibility, impaired sensation, impaired UE functional use, impaired vision/reception, pain, and chronic, progressive, BLE swelling and associated skin changes, at increased infection risk   ACTIVITY LIMITATIONS: Functional ambulation and mobility (lifting, carrying,  steps/stairs, transfers, squatting) , Basic and instrumental ADLs, leisure pursuits, productive activities, social participation  PARTICIPATION LIMITATIONS: meal prep, cleaning, laundry, driving, shopping, yard work, and    PERSONAL FACTORS: Age, Fitness, Past/current experiences, Time since onset of injury/illness/exacerbation, 2+ co morbidities: OSA, HTN,  are also affecting patient's functional outcome.   REHAB POTENTIAL: Good  EVALUATION COMPLEXITY: Moderate   GOALS: Goals reviewed with patient? Yes  SHORT TERM GOALS: Target date: 4th OT Rx visit   Pt  will demonstrate understanding of lymphedema precautions and prevention strategies with modified independence using a printed reference to identify at least 5 precautions and discussing how s/he may implement them into daily life to reduce risk of progression with extra time. Baseline:Max A Goal status: GOAL MET  2.  Pt will be able to apply multilayer, knee length, gradient, compression wraps to one leg at a time from toes to below knee with max caregiver assist to decrease limb volume, to limit infection risk, and to limit lymphedema progression.  Baseline: Dependent Goal status: GOAL MET  LONG TERM GOALS: Target date: 09/19/23  1.Given this patient's Intake score of TBA % on the Lymphedema Life Impact Scale (LLIS), patient will experience a reduction of at least 5 points in her perceived level of functional impairment resulting from lymphedema to improve functional performance and quality of life (QOL). Baseline: TBA % Goal status:  GOAL DEFERRED  2.  Pt will achieve at least a 10% volume reduction in B legs to return limb to typical size and shape, to limit infection risk and LE progression, to decrease pain, to improve function. Baseline: Dependent Goal status: PROGRESSING. R LEG volume reduced by 14.7% since initially measured on 07/18/21  3.  Pt will obtain appropriate compression garments/devices and achieve modified  independence (extra time + assistive devices) with donning/doffing to optimize limb volume reductions and limit LE progression over time. Baseline: Dependent Goal status: PROGRESSING. RLE garment on order  During Intensive phase CDT, with max CG assistance, Pt will achieve at least 85% compliance with all adapted lymphedema self-care home program components, including daily skin care, compression wraps and /or garments, simple self MLD and lymphatic pumping therex to habituate LE self care protocol  into ADLs for optimal LE self-management over time. Baseline: Dependent Goal status: GOAL MET  PLAN:  OT FREQUENCY: 2 x/week  OT DURATION: 12 weeks  PLANNED INTERVENTIONS:compression bandaging, skin care,  97110-Therapeutic exercises, 97530- Therapeutic activity, 97535- Self Care, Manual lymph drainage, DME instructions, and fit with appropriate compression  PLAN FOR NEXT SESSION:  Measure R leg to assess progress towards a stocking, or some alternative Cont Pt edu for LE self-care   Judith Dec, MS, OTR/L, CLT-LANA 10/18/23 2:02 PM   OUTPATIENT OCCUPATIONAL THERAPY TREATMENT NOTE  BILATERAL LOWER EXTREMITY LYMPHEDEMA  Patient Name: Judith Arnold MRN: [id number] DOB:1950/12/11, 73 y.o., female Today's Date: 10/18/2023  REPORTING PERIOD:   END OF SESSION:   OT End of Session - 10/18/23 1315     Visit Number 23    Number of Visits 36    Date for OT Re-Evaluation 01/16/24    OT Start Time 0105    OT Stop Time 0201    OT Time Calculation (min) 56 min    Equipment Utilized During Treatment sock donner; compression garment samples    Activity Tolerance Patient tolerated treatment well;No increased pain    Behavior During Therapy WFL for tasks assessed/performed           Past Medical History:  Diagnosis Date   Allergy    Arthritis    lower back, left shoulder   Asthma    Carpal tunnel syndrome on both sides    Dental bridge present    top   Dental crown  present    multiple   Family history of adverse reaction to anesthesia    Father and sister - PONV   GERD (gastroesophageal reflux disease)    Glaucoma    Hypertension    Leaky  heart valve    Pre-diabetes    Sleep apnea    Past Surgical History:  Procedure Laterality Date   CATARACT EXTRACTION W/PHACO Right 11/16/2016   Procedure: CATARACT EXTRACTION PHACO AND INTRAOCULAR LENS PLACEMENT (IOC)  right;  Surgeon: Mittie Gaskin, MD;  Location: [facility];  Service: Ophthalmology;  Laterality: Right;  pre diabetic latex sensitivity sleep apnea   CATARACT EXTRACTION W/PHACO Left 01/18/2017   Procedure: CATARACT EXTRACTION PHACO AND INTRAOCULAR LENS PLACEMENT (IOC) LEFT DIABETIC;  Surgeon: Mittie Gaskin, MD;  Location: [facility];  Service: Ophthalmology;  Laterality: Left;   COLONOSCOPY     COLONOSCOPY WITH PROPOFOL  N/A 04/09/2018   Procedure: COLONOSCOPY WITH PROPOFOL ;  Surgeon: Viktoria Lamar DASEN, MD;  Location: [facility] END[facility];  Service: Endoscopy;  Laterality: N/A;   IRIDOTOMY / IRIDECTOMY Bilateral 2005   TOOTH EXTRACTION     Patient Active Problem List   Diagnosis Date Noted   Chronic bilateral low back pain with right-sided sciatica 09/29/2023   Hypertension associated with diabetes (HCC) 01/20/2023   Combined hyperlipidemia associated with type 2 diabetes mellitus (HCC) 01/20/2023   Intertrigo 11/01/2022   Stasis dermatitis of both legs 10/21/2022   Leg swelling 10/21/2022   Lymphedema of lower extremity 10/21/2022   Type 2 diabetes mellitus without complication, without long-term current use of insulin (HCC) 05/24/2022   Mixed hyperlipidemia 05/24/2022   Essential hypertension, benign 05/24/2022   Absolute anemia 05/24/2022   Gastroesophageal reflux disease without esophagitis 05/24/2022   Allergic rhinitis 05/24/2022   Asthma, allergic, moderate persistent, uncomplicated 05/24/2022   Bilateral chronic angle-closure glaucoma, indeterminate stage  05/24/2022   Airway hyperreactivity 08/29/2013   OSA on CPAP 08/29/2013    PCP: Fredy GORMAN Bathe, MD  REFERRING PROVIDER: same  REFERRING DIAG: lymphedema I89.0  THERAPY DIAG:  Lymphedema, not elsewhere classified  Rationale for Evaluation and Treatment: Rehabilitation  ONSET DATE: Pt reports onset of L ankle / leg swelling after injury 20-30 yrs ago. Then about 1 year ago legs started swelling and Pt having prickly sensation on shins and thighs bilaterally.   SUBJECTIVE:                                                                                                                                                                                          SUBJECTIVE STATEMENT: Judith Arnold presents to OT for treatment of BLE lymphedema 2/2 suspected CVI and obesity. Pt is unaccompanied today. Pt denies LE related pain today. She brings clean wraps to clinic rolled and ready to re apply. Pt has no new concerns. She tells me she has not heard from the DME provider re cost of  her garments/. OT emailed vendor and requested to contact Pt.  (INITIAL EVAL 07/12/23 : Judith Arnold is referred to Occupational Therapy by Fredy GORMAN Bathe, MD,  for evaluation and treatment of BLE lymphedema.  Pt reports onset of L ankle and distal leg swelling about 30 years ago, then noticed RLE swelling and worsening LLE swelling about 1 year ago at same time she started having unusual sensation in legs. Pt denies previously having lymphedema treatment. Pt reports paternal aunt had leg swelling. Pt is unable to wear compression stockings because she is unable to reach feet and distal legs to don and doff them due to back and hip pain.)  PERTINENT HISTORY: Relevant to lymphedema (OSA (denies using CPAP), BLE stasis dermatitis, HTN, DM 2 (Pt states she is prediabetic only), B glaucoma, Back and R hip OA  PAIN:  Are you having LE-related pain? Yes: NPRS scale: 0/10 Pain location: medial proximal thighs w  abduction Pain description: like stickers Aggravating factors: standing, walking, dependent sitting Relieving factors: elevation  PRECAUTIONS: Fall and Other: LYMPHEDEMA precautions  (DM 2 and asthma)  WEIGHT BEARING RESTRICTIONS: No  FALLS:  Has patient fallen in last 6 months? yes Fell asleep at the computer and fell onto the floor [place] when storm door hit me  LIVING ENVIRONMENT: Lives with: alone Lives in: House/apartment Stairs: No;  Has following equipment at home: Environmental consultant - 4 wheeled  OCCUPATION: retired Scientist, research (life sciences)  LEISURE: reading and taking notes on current events and politics  HAND DOMINANCE: right   PRIOR LEVEL OF FUNCTION: Independent with household mobility with device, Requires assistive device for independence, Needs assistance with ADLs, Needs assistance with homemaking, and Needs assistance with gait  PATIENT GOALS: reduce swelling and keep it from getting worse  OBJECTIVE: Note: Objective measures were completed at Evaluation unless otherwise noted.  COGNITION:  Overall cognitive status: impaired short term  memory   OBSERVATIONS / OTHER ASSESSMENTS: Mild, Stage II, BLE Lymphedema 2/2 suspected venous insufficiency.   SENSATION: Reports uncomfortable scratching, or sticker-like  sensation on anterior thighs   POSTURE: Raised walker handles 2 notches to increase upright spinal alignment when walking to limit falls risk. Handles need to be raised at least 1 hole more for safe upright posture when walking.  Upright sitting posture: head forward , shoulders forward and rounded- appears to be flexible kyphosis  LE ROM: WFL for ankles and knees. Pt reports limited hip abduction  LE MMT: [facility] FOR LYMPHEDEMA CARE. Presenting with generalized weakness and Pt reports debility  LYMPHEDEMA ASSESSMENTS:   SURGERY TYPE/DATE: Non-cancer related limb swelling  HX INFECTIONS: positive for 1 episode cellulitis treated w antibiotics  Hx WOUNDS:  denies   BLE COMPARATIVE LIMB VOLUMETRICS: INITIAL 07/19/23  LANDMARK RIGHT (dominant)  R LEG (A-D) 3525.1 ml  R THIGH (E-G) ml  R FULL LIMB (A-G) ml  Limb Volume differential (LVD)  %  Volume change since initial %  Volume change overall V  (Blank rows = not tested)  LANDMARK LEFT    L LEG (A-D) 3769.6 ml  L THIGH (E-G) ml  L FULL LIMB (A-G) ml  Limb Volume differential (LVD)  Limb Volume Differential (LVD) measures 6.5%, L>R.  Volume change since initial %  Volume change overall %    RLE COMPARATIVE LIMB VOLUMETRICS: VISIT 9 08/15/23  LANDMARK RIGHT (dominant)  R LEG (A-D) 3549.4 ml  R THIGH (E-G) ml  R FULL LIMB (A-G) ml  Limb Volume differential (LVD)  %  Volume change  since initial R LEG volume reduced by 5.84% since initially measured on 07/18/21  Volume change overall V  (Blank rows = not tested)  RLE COMPARATIVE LIMB VOLUMETRICS: VISIT 19 10/03/23  LANDMARK RIGHT (dominant)  R LEG (A-D) 3549.4 ml  R THIGH (E-G) ml  R FULL LIMB (A-G) ml  Limb Volume differential (LVD)  %  Volume change since initial R LEG volume reduced by 14.7% since initially measured on 07/18/21  Volume change overall V  (Blank rows = not tested)  Mild, Stage  II, Bilateral Lower Extremity Lymphedema 2/2 CVI and Obesity  Skin  Description Hyper-Keratosis Peau' de Orange Shiny Tight Fibrotic/ Indurated Fatty Doughy Spongy/ boggy       R>L x  x   Skin dry Flaky WNL Macerated   mildly      Color Redness Varicosities Blanching Hemosiderin Stain Mottled   x     x   Odor Malodorous Yeast Fungal infection  WNL      x   Temperature Warm Cool wnl    x     Pitting Edema   1+ 2+ 3+ 4+ Non-pitting         x   Girth Symmetrical Asymmetrical                   Distribution    R>L toes to groin    Stemmer Sign Positive Negative   +    Lymphorrhea History Of:  Present Absent     x    Wounds History Of Present Absent Venous Arterial Pressure Sheer     x        Signs of Infection  Redness Warmth Erythema Acute Swelling Drainage Borders                    Sensation Light Touch Deep pressure Hypersensitivity   Present Impaired Present Impaired Absent Impaired   x Tactile  x  x     Nails WNL   Fungus nail dystrophy   x     Hair Growth Symmetrical Asymmetrical   x    Skin Creases Base of toes  Ankles   Base of Fingers knees       Abdominal pannus Thigh Lobules  Face/neck   x x  x      (Blank rows = not tested)    GAIT: Distance walked: >500' Assistive device utilized: Environmental consultant - 4 wheeled Level of assistance: Modified independence Comments: Pt bent at waist nearly 90 degrees with walker handles in lowest position  LYMPHEDEMA LIFE IMPACT SCALE (LLIS):   TREATMENT DATE:  RLE/RLQ MLD and simultaneous skin care RLE multilayer compression bandaging Pt edu for lymphedema progress towards goals and self-care    PATIENT EDUCATION:  Continued Pt/ CG edu for lymphedema self care home program throughout session. Topics include outcome of comparative limb volumetrics- starting limb volume differentials (LVDs), technology and gradient techniques used for short stretch, multilayer compression wrapping, simple self-MLD, therapeutic lymphatic pumping exercises, skin/nail care, LE precautions, compression garment recommendations and specifications, wear and care schedule and compression garment donning / doffing w assistive devices. Discussed progress towards all OT goals since commencing CDT. Discussed detrimental impact of obesity on lower and upper extremity lymphedema over time. Reviewed OT goals for lymphedema care with Pt and discussed progress to date.  All questions answered to the Pt's satisfaction. Good return. Person educated: Patient  Education method: Explanation, Demonstration, and Handouts Education comprehension: verbalized understanding, returned demonstration, verbal cues required, and  needs further education   HOME EXERCISE PROGRAM: BLE lymphatic  pumping there ex using- 1 sets of 10 reps, each exercise in order-  1-2 x daily, bilaterally Simple self MLD 1 x daily Daily skin care to increase hydration, skin mobility and decrease infection risk- can be done during MLD During Intensive Phase CDT: Compression wraps 23/7 until limb volume reduction complete During Self-management Phase CDT: Fit with appropriate compression garments or alternatives. Consider BLE, knee length, Mediven, custom, CircAid , Velcro style leggings over soft cotton liners.  ASSESSMENT: CLINICAL IMPRESSION:  Continued MLD with simultaneous skin care to RLE/RLQ today without pain. Re applied compression wraps to RLE as established and completed session. Garment alternatives are on order. Awaiting DME vendor to contact Pt so order and fitting can proceed. Cont as per POC.  (INITIAL EVAL 07/12/23: Anni Graylin Arnold is a 73 yo female presenting with chronic, progressive, BLE swelling 2/2 unknown etiology, which at first glance appears to be circulatory in nature due to skin coloring at distal legs where swelling is most invested. Pt is presently unable to wear traditional elastic compression garments due to difficulty donning and doffing them.  Chronic, progressive lymphedema with associated skin changes, including fibrosis, limits this patent's functional performance in all occupational domains, including functional ambulation and mobility, basic and instrumental ADLs (lower body dressing, LB bathing, fitting street shoes and LB clothing, driving, shopping, and home management. It also limits perform productive activities, leisure pursuits, and participation in social and community activities. BLE lymphedema contributes to elevated infection risk. Pt will benefit from skilled OT for Complete Decongestive Therapy (CDT), which  typically includes manual lymphatic drainage (MLD), skin care to limit' infection risk and increase skin excursion, lymphatic pumping exercise, and during  the Intensive Phase multilayer, gradient compression bandaging to reduce limb volume. Once limb volume is reduced as much as possible, Pt is fitted with appropriate compression garments and/ or devices and transitions into the self management phase of care consisting of follow long support PRN.    Pt understands that her fair prognosis will become poor without daily assistance with compression wrapping since she is unable to reach feet and legs to apply them herself. Pt assures OT that she has a friend she believes is willing to assist her between OT sessions. )   OBJECTIVE IMPAIRMENTS: decreased standing and walking tolerance, activity tolerance, decreased balance, decreased knowledge of condition, decreased knowledge of use of DME, decreased mobility, decreased ROM, decreased strength, increased edema, impaired flexibility, impaired sensation, impaired UE functional use, impaired vision/reception, pain, and chronic, progressive, BLE swelling and associated skin changes, at increased infection risk   ACTIVITY LIMITATIONS: Functional ambulation and mobility (lifting, carrying, steps/stairs, transfers, squatting) , Basic and instrumental ADLs, leisure pursuits, productive activities, social participation  PARTICIPATION LIMITATIONS: meal prep, cleaning, laundry, driving, shopping, yard work, and    PERSONAL FACTORS: Age, Fitness, Past/current experiences, Time since onset of injury/illness/exacerbation, 2+ co morbidities: OSA, HTN,  are also affecting patient's functional outcome.   REHAB POTENTIAL: Good  EVALUATION COMPLEXITY: Moderate   GOALS: Goals reviewed with patient? Yes  SHORT TERM GOALS: Target date: 4th OT Rx visit   Pt will demonstrate understanding of lymphedema precautions and prevention strategies with modified independence using a printed reference to identify at least 5 precautions and discussing how s/he may implement them into daily life to reduce risk of progression with extra  time. Baseline:Max A Goal status: GOAL MET  2.  Pt will be able to apply multilayer, knee length,  gradient, compression wraps to one leg at a time from toes to below knee with max caregiver assist to decrease limb volume, to limit infection risk, and to limit lymphedema progression.  Baseline: Dependent Goal status: GOAL MET  LONG TERM GOALS: Target date: 09/19/23  1.Given this patient's Intake score of TBA % on the Lymphedema Life Impact Scale (LLIS), patient will experience a reduction of at least 5 points in her perceived level of functional impairment resulting from lymphedema to improve functional performance and quality of life (QOL). Baseline: TBA % Goal status:  GOAL DEFERRED  2.  Pt will achieve at least a 10% volume reduction in B legs to return limb to typical size and shape, to limit infection risk and LE progression, to decrease pain, to improve function. Baseline: Dependent Goal status: PROGRESSING. R LEG volume reduced by 14.7% since initially measured on 07/18/21  3.  Pt will obtain appropriate compression garments/devices and achieve modified independence (extra time + assistive devices) with donning/doffing to optimize limb volume reductions and limit LE progression over time. Baseline: Dependent Goal status: PROGRESSING  During Intensive phase CDT, with max CG assistance, Pt will achieve at least 85% compliance with all adapted lymphedema self-care home program components, including daily skin care, compression wraps and /or garments, simple self MLD and lymphatic pumping therex to habituate LE self care protocol  into ADLs for optimal LE self-management over time. Baseline: Dependent Goal status:PROGRESSING  PLAN:  OT FREQUENCY: 2 x/week  OT DURATION: 12 weeks  PLANNED INTERVENTIONS:compression bandaging, skin care,  97110-Therapeutic exercises, 97530- Therapeutic activity, 97535- Self Care, Manual lymph drainage, DME instructions, and fit with appropriate  compression  PLAN FOR NEXT SESSION:  Measure R leg to assess progress towards a stocking, or some alternative Cont Pt edu for LE self-care   Judith Dec, MS, OTR/L, CLT-LANA 10/18/23 2:02 PM

## 2023-10-18 NOTE — Therapy (Signed)
 OUTPATIENT PHYSICAL THERAPY THORACOLUMBAR TREATMENT   Patient Name: Judith Arnold MRN: [id number] DOB:June 22, 1950, 73 y.o., female Today's Date: 10/18/2023  END OF SESSION:  PT End of Session - 10/18/23 1346     Visit Number 6    Number of Visits 25    Date for PT Re-Evaluation 12/12/23    Progress Note Due on Visit 10    PT Start Time 1402    PT Stop Time 1444    PT Time Calculation (min) 42 min    Equipment Utilized During Treatment Gait belt    Activity Tolerance Patient tolerated treatment well;Patient limited by pain    Behavior During Therapy WFL for tasks assessed/performed             Past Medical History:  Diagnosis Date   Allergy    Arthritis    lower back, left shoulder   Asthma    Carpal tunnel syndrome on both sides    Dental bridge present    top   Dental crown present    multiple   Family history of adverse reaction to anesthesia    Father and sister - PONV   GERD (gastroesophageal reflux disease)    Glaucoma    Hypertension    Leaky heart valve    Pre-diabetes    Sleep apnea    Past Surgical History:  Procedure Laterality Date   CATARACT EXTRACTION W/PHACO Right 11/16/2016   Procedure: CATARACT EXTRACTION PHACO AND INTRAOCULAR LENS PLACEMENT (IOC)  right;  Surgeon: Mittie Gaskin, MD;  Location: [facility];  Service: Ophthalmology;  Laterality: Right;  pre diabetic latex sensitivity sleep apnea   CATARACT EXTRACTION W/PHACO Left 01/18/2017   Procedure: CATARACT EXTRACTION PHACO AND INTRAOCULAR LENS PLACEMENT (IOC) LEFT DIABETIC;  Surgeon: Mittie Gaskin, MD;  Location: [facility];  Service: Ophthalmology;  Laterality: Left;   COLONOSCOPY     COLONOSCOPY WITH PROPOFOL  N/A 04/09/2018   Procedure: COLONOSCOPY WITH PROPOFOL ;  Surgeon: Viktoria Lamar DASEN, MD;  Location: [facility];  Service: Endoscopy;  Laterality: N/A;   IRIDOTOMY / IRIDECTOMY Bilateral 2005   TOOTH EXTRACTION     Patient Active Problem List    Diagnosis Date Noted   Chronic bilateral low back pain with right-sided sciatica 09/29/2023   Hypertension associated with diabetes (HCC) 01/20/2023   Combined hyperlipidemia associated with type 2 diabetes mellitus (HCC) 01/20/2023   Intertrigo 11/01/2022   Stasis dermatitis of both legs 10/21/2022   Leg swelling 10/21/2022   Lymphedema of lower extremity 10/21/2022   Type 2 diabetes mellitus without complication, without long-term current use of insulin (HCC) 05/24/2022   Mixed hyperlipidemia 05/24/2022   Essential hypertension, benign 05/24/2022   Absolute anemia 05/24/2022   Gastroesophageal reflux disease without esophagitis 05/24/2022   Allergic rhinitis 05/24/2022   Asthma, allergic, moderate persistent, uncomplicated 05/24/2022   Bilateral chronic angle-closure glaucoma, indeterminate stage 05/24/2022   Airway hyperreactivity 08/29/2013   OSA on CPAP 08/29/2013    PCP: Fernand Fredy RAMAN, MD  REFERRING PROVIDER: Fernand Fredy, RAMAN, MD  REFERRING DIAG:  Diagnosis  G89.29,M54.41 (ICD-10-CM) - Chronic bilateral low back pain with right-sided sciatica    Rationale for Evaluation and Treatment: Rehabilitation  THERAPY DIAG:  Other low back pain  Pain in right hip  Muscle weakness (generalized)  Difficulty in walking, not elsewhere classified  Other lack of coordination  ONSET DATE: >3 years   SUBJECTIVE:  SUBJECTIVE STATEMENT:  Today:  Patient reports ongoing pain. Reports would like to review log roll technique.   From EVAL: Pt is a pleasant 73 y/o female presenting to PT eval for chronic LBP with R side hip pain. She reports onset of LBP over 3 years ago. Primary site of pain is mostly felt in R hip and lower spine. Pt reports hx of arthritis in lower back and R hip. She is now  starting to feel arthritis pain in her knees as well. Reports RLE is actually her good leg, has some issues with pain into groin region of LLE. Pt states strength in legs has been impacted and that her general mobility is worse. Pt can only ambulate comfortably by taking weight off of lower back by using her 4WW. She states not using 4WW so much for balance, but for reducing pain with gait. She is unable to stand up fully due to pain. Pt says she used to be very fit (took take karate, gymnastics years ago). She is also seeing OT for BLE lymphedema management. She endorses difficulty getting out of chairs, difficulty with steps, she must use a ramp to get in and out of her home. Pt reports my brain does not retain what I'm talking about, can lose train of thought.  PERTINENT HISTORY:  PMH per chart includes arthritis low back and left shoulder, bilateral carpal tunnel, asthma, glaucoma, HTN, leaky heart valve, sleep apnea, pre-diabetes, being seen by lymphedema specialist for BLE lymphedema   PAIN:  Are you having pain? LBP and R hip pain  Lowest pain level: 0/10  With standing: 2/10  Worst: 5-6/10   PRECAUTIONS: LATEX ALLERGY per chart, fall  RED FLAGS: None   WEIGHT BEARING RESTRICTIONS: No  FALLS:  Has patient fallen in last 6 months? No  LIVING ENVIRONMENT: Uses ramp to get into home due to difficulty with steps, has 4WW  PLOF: Independent  PATIENT GOALS:  Says due to back pain she is unable to reach my feet, has difficulty getting her socks on without a grabber, wants to be more flexible and stronger to increase ease with these activities and mobility    OBJECTIVE:  Note: Objective measures were completed at Evaluation unless otherwise noted.  DIAGNOSTIC FINDINGS:  No recent pertinent imaging available in chart  PATIENT SURVEYS:  Modified Oswestry score: 34%  Interpretation of scores: Score Category Description  0-20% Minimal Disability The patient can cope with most  living activities. Usually no treatment is indicated apart from advice on lifting, sitting and exercise  21-40% Moderate Disability The patient experiences more pain and difficulty with sitting, lifting and standing. Travel and social life are more difficult and they may be disabled from work. Personal care, sexual activity and sleeping are not grossly affected, and the patient can usually be managed by conservative means  41-60% Severe Disability Pain remains the main problem in this group, but activities of daily living are affected. These patients require a detailed investigation  61-80% Crippled Back pain impinges on all aspects of the patient's life. Positive intervention is required  81-100% Bed-bound  These patients are either bed-bound or exaggerating their symptoms  Bluford FORBES Zoe DELENA Karon DELENA, et al. Surgery versus conservative management of stable thoracolumbar fracture: the PRESTO feasibility RCT. [place] ([place]): [employer]; 2021 Nov. [facility] Technology Assessment, No. 25.62.) Appendix 3, Oswestry Disability Index category descriptors. Available from: FindJewelers.cz  Minimally Clinically Important Difference (MCID) = 12.8%  COGNITION: Overall cognitive status: Within functional limits  for tasks assessed, very pleasant pt can become tangential and requires redirection to activity    SENSATION: Intact to light touch BLE   MUSCLE LENGTH: deferred  POSTURE: increased thoracic kyphosis, rounded shoulders, unable to achieve full upright position due to pain, maintains increased hip and knee flexion when attempting to stand fully upright   PALPATION: deferred  Thoracolumbar AROM: Extension - unable to achieve neutral position, remains in significant flexion  Flexion - lacking at least 25%  Rotation - lacking at least 40% bilat  and pain-limited    LOWER EXTREMITY MMT:    MMT Right eval Left eval  Hip flexion 4- 3*  Hip extension     Hip abduction 4+ 4+  Hip adduction 4+ 4+  Hip internal rotation    Hip external rotation    Knee flexion 4 3*  Knee extension 4+ 4+  Ankle dorsiflexion 4 4+  Ankle plantarflexion    Ankle inversion    Ankle eversion     (Blank rows = not tested) *=pain limited   LUMBAR SPECIAL TESTS:  deferred  FUNCTIONAL TESTS:  10 meter walk test: 0.63 m/s crouched posture, decreased hip extension, decreased step-length and heel strike, elevated R>L shoulder, heavy BUE weightbearing on 4WW  GAIT: Distance walked: 10MWT/clinic distances (see above) Assistive device utilized: Environmental consultant - 4 wheeled Level of assistance: Modified independence Comments: gait mechanics impaired: decreased gait speed, crouched posture, decreased hip extension, decreased step-length and heel strike, elevated R>L shoulder, heavy BUE weightbearing on 4WW  TREATMENT DATE: 10/18/2023   THEREX:   - seated clamshell 2 x 10 reps with 3 sec hold  - seated gluteal set with 3 sec hold 2 x 10 reps  -seated core activation with hip march Alt LE x 10 reps each  -Seated core activation with knee ext alt LE 2 x 10 reps each (patient reports difficulty due to OA left knee)  -Self stretching- using bed sheet with some seated Hip ER x multiple trials each LE (more difficulty with Left LE)  -Self stretching- using bed sheet with seated hamstring stretching x 30 sec x 3  TA:   Review of Log roll with supine<->sit and patient performed x 3 - Difficulty lifting legs onto mat table initially but did improve with practice.   Self care:   Assistance with log roll technique to ensure proper form for proper use at home Assisted patient in looking up 1/2 bed rails that could help her with bed mobility and potentially decrease her back pain with bed mobility- She was able to simulate with use of back of rollator with PT holding walker in place and demonstrated much safer transfers.                                                                                                                      PATIENT EDUCATION:  Education details: Provided education on findings of exam, indications for plan, prognoses, goals, how PT can help  Person educated: Patient Education method: Explanation Education comprehension: verbalized understanding  HOME EXERCISE PROGRAM: Access Code: [email or web address] URL: [email or web address] Date: 10/05/2023 Prepared by: Lonni Gainer  Exercises - Supine Bridge  - 3 x weekly - 3 sets - 10 reps - Clamshell  - 3 x weekly - 3 sets - 10 reps - Supine Lower Trunk Rotation  - 1 x daily - 3 sets - 30 sec hold - Seated Hamstring Stretch  - 1 x daily - 3 sets - 30 hold - Seated Clamshell  - 1 x daily - 7 x weekly - 2 sets - 20 reps - 3 sec hold - Supine Transversus Abdominis Bracing - Hands on Stomach  - 3 x weekly - 3 sets - 10 reps - Supine Gluteal Sets  - 3 x weekly - 3 sets - 10 reps  ASSESSMENT:  CLINICAL IMPRESSION:  Patient presents with ongoing back and bilateral leg pain. She was able to perform several core engaging activities. Spent time focusing on abdominal bracing and patient was able to contract properly- Will need further training to ensure comprehension. Advised on obtaining a 1/2 bed rail and patient to look into this adaptive equipment. Pt will continue to benefit from skilled physical therapy intervention to address impairments, improve QOL, and attain therapy goals.    OBJECTIVE IMPAIRMENTS: Abnormal gait, decreased activity tolerance, decreased balance, decreased mobility, difficulty walking, decreased strength, hypomobility, increased edema, improper body mechanics, postural dysfunction, and pain.   ACTIVITY LIMITATIONS: carrying, lifting, bending, squatting, stairs, transfers, bed mobility, and locomotion level  PARTICIPATION LIMITATIONS: meal prep, cleaning, shopping, community activity, and yard work  PERSONAL FACTORS: Age, Fitness, Sex, Time since  onset of injury/illness/exacerbation, and 3+ comorbidities: PMH per chart includes arthritis low back and left shoulder, bilateral carpal tunnel, asthma, glaucoma, HTN, leaky heart valve, sleep apnea, pre-diabetes, being seen by lymphedema specialist for BLE lymphedema  are also affecting patient's functional outcome.   REHAB POTENTIAL: Good  CLINICAL DECISION MAKING: Evolving/moderate complexity  EVALUATION COMPLEXITY: Moderate   GOALS: Goals reviewed with patient? Yes   SHORT TERM GOALS: Target date: 10/31/2023    Patient will be independent in home exercise program to improve strength/mobility for better functional independence with ADLs. Baseline:to be initiated  Goal status: INITIAL   LONG TERM GOALS: Target date: 12/12/2023    Patient will reduce modified Oswestry score to <20 as to demonstrate minimal disability with ADLs including improved sleeping tolerance, walking/sitting tolerance etc for better mobility with ADLs.  Baseline: 34 Goal status: INITIAL  2.  Patient (> 57 years old) will complete five times sit to stand test in < 15 seconds indicating an increased LE strength and improved balance. Baseline: 09/26/2023= 26.46 sec with heavy UE Support Goal status: INITIAL  3.  Patient will increase Berg Balance score by > 6 points to demonstrate decreased fall risk during functional activities Baseline: 09/26/2023= 31/56 Goal status: INITIAL  4.  Patient will increase 10 meter walk test to >1.109m/s as to improve gait speed for better community ambulation and to reduce fall risk. Baseline:  0.63 m/s with 4WW Goal status: INITIAL      PLAN:  PT FREQUENCY: 1-2x/week  PT DURATION: 12 weeks  PLANNED INTERVENTIONS: [id number]- PT Re-evaluation, 97750- Physical Performance Testing, 97110-Therapeutic exercises, 97530- Therapeutic activity, [id number]- Neuromuscular re-education, 97535- Self Care, [id number]- Manual therapy, [id number]- Gait training, [phone]- Orthotic Initial, [phone]-  Orthotic/Prosthetic subsequent, [phone]- Canalith repositioning, Patient/Family education, Balance training, Stair training, Taping, Joint mobilization, Spinal mobilization, Vestibular training, DME instructions, Cryotherapy, and  Moist heat.  PLAN FOR NEXT SESSION:   Continue to progress HEP Manual therapy for lumbar ROM/pain Therex for ROM, Low back/LE/Core strengthening Ensure log roll is being performed properly    Reyes LOISE London, PT 10/18/2023, 5:09 PM

## 2023-10-20 ENCOUNTER — Ambulatory Visit (INDEPENDENT_AMBULATORY_CARE_PROVIDER_SITE_OTHER): Payer: Medicare PPO | Admitting: Nurse Practitioner

## 2023-10-23 ENCOUNTER — Ambulatory Visit: Admitting: Occupational Therapy

## 2023-10-23 DIAGNOSIS — R262 Difficulty in walking, not elsewhere classified: Secondary | ICD-10-CM | POA: Diagnosis not present

## 2023-10-23 DIAGNOSIS — I890 Lymphedema, not elsewhere classified: Secondary | ICD-10-CM

## 2023-10-23 DIAGNOSIS — M5459 Other low back pain: Secondary | ICD-10-CM | POA: Diagnosis not present

## 2023-10-23 DIAGNOSIS — M6281 Muscle weakness (generalized): Secondary | ICD-10-CM | POA: Diagnosis not present

## 2023-10-23 DIAGNOSIS — R278 Other lack of coordination: Secondary | ICD-10-CM | POA: Diagnosis not present

## 2023-10-23 DIAGNOSIS — M25551 Pain in right hip: Secondary | ICD-10-CM | POA: Diagnosis not present

## 2023-10-23 NOTE — Therapy (Signed)
 OUTPATIENT OCCUPATIONAL THERAPY TREATMENT NOTE  BILATERAL LOWER EXTREMITY LYMPHEDEMA  Patient Name: Judith Arnold MRN: [id number] DOB:Nov 04, 1950, 73 y.o., female Today's Date: 10/23/2023  REPORTING PERIOD:   END OF SESSION:   OT End of Session - 10/23/23 1110     Visit Number 24    Number of Visits 36    Date for OT Re-Evaluation 01/16/24    OT Start Time 1103    OT Stop Time 1203    OT Time Calculation (min) 60 min    Equipment Utilized During Treatment sock donner; compression garment samples    Activity Tolerance Patient tolerated treatment well;No increased pain    Behavior During Therapy WFL for tasks assessed/performed           Past Medical History:  Diagnosis Date   Allergy    Arthritis    lower back, left shoulder   Asthma    Carpal tunnel syndrome on both sides    Dental bridge present    top   Dental crown present    multiple   Family history of adverse reaction to anesthesia    Father and sister - PONV   GERD (gastroesophageal reflux disease)    Glaucoma    Hypertension    Leaky heart valve    Pre-diabetes    Sleep apnea    Past Surgical History:  Procedure Laterality Date   CATARACT EXTRACTION W/PHACO Right 11/16/2016   Procedure: CATARACT EXTRACTION PHACO AND INTRAOCULAR LENS PLACEMENT (IOC)  right;  Surgeon: Mittie Gaskin, MD;  Location: [facility];  Service: Ophthalmology;  Laterality: Right;  pre diabetic latex sensitivity sleep apnea   CATARACT EXTRACTION W/PHACO Left 01/18/2017   Procedure: CATARACT EXTRACTION PHACO AND INTRAOCULAR LENS PLACEMENT (IOC) LEFT DIABETIC;  Surgeon: Mittie Gaskin, MD;  Location: [facility];  Service: Ophthalmology;  Laterality: Left;   COLONOSCOPY     COLONOSCOPY WITH PROPOFOL  N/A 04/09/2018   Procedure: COLONOSCOPY WITH PROPOFOL ;  Surgeon: Viktoria Lamar DASEN, MD;  Location: [facility];  Service: Endoscopy;  Laterality: N/A;   IRIDOTOMY / IRIDECTOMY Bilateral 2005   TOOTH  EXTRACTION     Patient Active Problem List   Diagnosis Date Noted   Chronic bilateral low back pain with right-sided sciatica 09/29/2023   Hypertension associated with diabetes (HCC) 01/20/2023   Combined hyperlipidemia associated with type 2 diabetes mellitus (HCC) 01/20/2023   Intertrigo 11/01/2022   Stasis dermatitis of both legs 10/21/2022   Leg swelling 10/21/2022   Lymphedema of lower extremity 10/21/2022   Type 2 diabetes mellitus without complication, without long-term current use of insulin (HCC) 05/24/2022   Mixed hyperlipidemia 05/24/2022   Essential hypertension, benign 05/24/2022   Absolute anemia 05/24/2022   Gastroesophageal reflux disease without esophagitis 05/24/2022   Allergic rhinitis 05/24/2022   Asthma, allergic, moderate persistent, uncomplicated 05/24/2022   Bilateral chronic angle-closure glaucoma, indeterminate stage 05/24/2022   Airway hyperreactivity 08/29/2013   OSA on CPAP 08/29/2013    PCP: Fredy GORMAN Bathe, MD  REFERRING PROVIDER: same  REFERRING DIAG: lymphedema I89.0  THERAPY DIAG:  Lymphedema, not elsewhere classified  Rationale for Evaluation and Treatment: Rehabilitation  ONSET DATE: Pt reports onset of L ankle / leg swelling after injury 20-30 yrs ago. Then about 1 year ago legs started swelling and Pt having prickly sensation on shins and thighs bilaterally.   SUBJECTIVE:  SUBJECTIVE STATEMENT: Judith Arnold presents to OT for treatment of BLE lymphedema 2/2 suspected CVI and obesity. Pt is unaccompanied today. Pt denies LE related pain today. She brings clean wraps to clinic rolled and ready to re apply. She tells me she still has not heard from the DME provider re cost of her garments. She called DME vendor and left VM during her appointment. Pt reports she  had an episode of numbness in her R lower extremity during interval that started in the foot and eventually extended to the upper thigh. Consequently Pt opted not to apply compression wraps over the past 24 hours. RLE is obviously more swollen today, especially around distal leg and ankle.   (INITIAL EVAL 07/12/23 : Judith Arnold is referred to Occupational Therapy by Fredy GORMAN Bathe, MD,  for evaluation and treatment of BLE lymphedema.  Pt reports onset of L ankle and distal leg swelling about 30 years ago, then noticed RLE swelling and worsening LLE swelling about 1 year ago at same time she started having unusual sensation in legs. Pt denies previously having lymphedema treatment. Pt reports paternal aunt had leg swelling. Pt is unable to wear compression stockings because she is unable to reach feet and distal legs to don and doff them due to back and hip pain.  PERTINENT HISTORY: Relevant to lymphedema (OSA (denies using CPAP), BLE stasis dermatitis, HTN, DM 2 (Pt states she is prediabetic only), B glaucoma, Back and R hip OA  PAIN:  Are you having LE-related pain? Yes: NPRS scale: 0/10 Pain location: medial proximal thighs w abduction Pain description: like stickers Aggravating factors: standing, walking, dependent sitting Relieving factors: elevation  PRECAUTIONS: Fall and Other: LYMPHEDEMA precautions  (DM 2 and asthma)  WEIGHT BEARING RESTRICTIONS: No  FALLS:  Has patient fallen in last 6 months? yes Fell asleep at the computer and fell onto the floor [place] when storm door hit me  LIVING ENVIRONMENT: Lives with: alone Lives in: House/apartment Stairs: No;  Has following equipment at home: Environmental consultant - 4 wheeled  OCCUPATION: retired Scientist, research (life sciences)  LEISURE: reading and taking notes on current events and politics  HAND DOMINANCE: right   PRIOR LEVEL OF FUNCTION: Independent with household mobility with device, Requires assistive device for independence, Needs  assistance with ADLs, Needs assistance with homemaking, and Needs assistance with gait  PATIENT GOALS: reduce swelling and keep it from getting worse  OBJECTIVE: Note: Objective measures were completed at Evaluation unless otherwise noted.  COGNITION:  Overall cognitive status: impaired short term  memory   OBSERVATIONS / OTHER ASSESSMENTS: Mild, Stage II, BLE Lymphedema 2/2 suspected venous insufficiency.   SENSATION: Reports uncomfortable scratching, or sticker-like  sensation on anterior thighs   POSTURE: Raised walker handles 2 notches to increase upright spinal alignment when walking to limit falls risk. Handles need to be raised at least 1 hole more for safe upright posture when walking.  Upright sitting posture: head forward , shoulders forward and rounded- appears to be flexible kyphosis  LE ROM: WFL for ankles and knees. Pt reports limited hip abduction )  LE MMT: WFL FOR LYMPHEDEMA CARE. Presenting with generalized weakness and Pt reports debility  LYMPHEDEMA ASSESSMENTS:   SURGERY TYPE/DATE: Non-cancer related limb swelling  HX INFECTIONS: positive for 1 episode cellulitis treated w antibiotics  Hx WOUNDS: denies   BLE COMPARATIVE LIMB VOLUMETRICS: INITIAL 07/19/23  LANDMARK RIGHT (dominant)  R LEG (A-D) 3525.1 ml  R THIGH (E-G) ml  R FULL LIMB (A-G) ml  Limb Volume differential (LVD)  %  Volume change since initial %  Volume change overall V  (Blank rows = not tested)  LANDMARK LEFT    L LEG (A-D) 3769.6 ml  L THIGH (E-G) ml  L FULL LIMB (A-G) ml  Limb Volume differential (LVD)  Limb Volume Differential (LVD) measures 6.5%, L>R.  Volume change since initial %  Volume change overall %    RLE COMPARATIVE LIMB VOLUMETRICS: VISIT 9 08/15/23  LANDMARK RIGHT (dominant)  R LEG (A-D) 3549.4 ml  R THIGH (E-G) ml  R FULL LIMB (A-G) ml  Limb Volume differential (LVD)  %  Volume change since initial R LEG volume reduced by 5.84% since initially measured on  07/18/21  Volume change overall V  (Blank rows = not tested)  RLE COMPARATIVE LIMB VOLUMETRICS: VISIT 19 10/03/23  LANDMARK RIGHT (dominant)  R LEG (A-D) 3549.4 ml  R THIGH (E-G) ml  R FULL LIMB (A-G) ml  Limb Volume differential (LVD)  %  Volume change since initial R LEG volume reduced by 14.7% since initially measured on 07/18/21  Volume change overall V  (Blank rows = not tested)  Mild, Stage  II, Bilateral Lower Extremity Lymphedema 2/2 CVI and Obesity  Skin  Description Hyper-Keratosis Peau' de Orange Shiny Tight Fibrotic/ Indurated Fatty Doughy Spongy/ boggy       R>L x  x   Skin dry Flaky WNL Macerated   mildly      Color Redness Varicosities Blanching Hemosiderin Stain Mottled   x     x   Odor Malodorous Yeast Fungal infection  WNL      x   Temperature Warm Cool wnl    x     Pitting Edema   1+ 2+ 3+ 4+ Non-pitting         x   Girth Symmetrical Asymmetrical                   Distribution    R>L toes to groin    Stemmer Sign Positive Negative   +    Lymphorrhea History Of:  Present Absent     x    Wounds History Of Present Absent Venous Arterial Pressure Sheer     x        Signs of Infection Redness Warmth Erythema Acute Swelling Drainage Borders                    Sensation Light Touch Deep pressure Hypersensitivity   Present Impaired Present Impaired Absent Impaired   x Tactile  x  x     Nails WNL   Fungus nail dystrophy   x     Hair Growth Symmetrical Asymmetrical   x    Skin Creases Base of toes  Ankles   Base of Fingers knees       Abdominal pannus Thigh Lobules  Face/neck   x x  x      (Blank rows = not tested)    GAIT: Distance walked: >500' Assistive device utilized: Environmental consultant - 4 wheeled Level of assistance: Modified independence Comments: Pt bent at waist nearly 90 degrees with walker handles in lowest position  LYMPHEDEMA LIFE IMPACT SCALE (LLIS): TBA OT Rx visit 1  TREATMENT DATE:  RLE/RLQ MLD and simultaneous  skin care RLE multilayer compression bandaging Pt edu for lymphedema progress towards goals and self-care    PATIENT EDUCATION:  Continued Pt/ CG edu for lymphedema self care home program throughout session. Topics  include outcome of comparative limb volumetrics- starting limb volume differentials (LVDs), technology and gradient techniques used for short stretch, multilayer compression wrapping, simple self-MLD, therapeutic lymphatic pumping exercises, skin/nail care, LE precautions, compression garment recommendations and specifications, wear and care schedule and compression garment donning / doffing w assistive devices. Discussed progress towards all OT goals since commencing CDT. Discussed detrimental impact of obesity on lower and upper extremity lymphedema over time. Reviewed OT goals for lymphedema care with Pt and discussed progress to date.  All questions answered to the Pt's satisfaction. Good return. Person educated: Patient  Education method: Explanation, Demonstration, and Handouts Education comprehension: verbalized understanding, returned demonstration, verbal cues required, and needs further education   HOME EXERCISE PROGRAM: BLE lymphatic pumping there ex using- 1 sets of 10 reps, each exercise in order-  1-2 x daily, bilaterally Simple self MLD 1 x daily Daily skin care to increase hydration, skin mobility and decrease infection risk- can be done during MLD During Intensive Phase CDT: Compression wraps 23/7 until limb volume reduction complete During Self-management Phase CDT: Fit with appropriate compression garments or alternatives. Consider BLE, knee length, Mediven, custom, CircAid , Velcro style leggings over soft cotton liners.  ASSESSMENT: CLINICAL IMPRESSION:  Continued MLD with simultaneous skin care to RLE/RLQ today without pain. Re applied compression wraps to RLE as established without pain or numbness. Garment alternatives are on order. Awaiting DME vendor to  contact Pt so order and fitting can proceed. Cont as per POC.  (INITIAL EVAL 07/12/23: Judith Arnold is a 73 yo female presenting with chronic, progressive, BLE swelling 2/2 unknown etiology, which at first glance appears to be circulatory in nature due to skin coloring at distal legs where swelling is most invested. Pt is presently unable to wear traditional elastic compression garments due to difficulty donning and doffing them.  Chronic, progressive lymphedema with associated skin changes, including fibrosis, limits this patent's functional performance in all occupational domains, including functional ambulation and mobility, basic and instrumental ADLs (lower body dressing, LB bathing, fitting street shoes and LB clothing, driving, shopping, and home management. It also limits perform productive activities, leisure pursuits, and participation in social and community activities. BLE lymphedema contributes to elevated infection risk. Pt will benefit from skilled OT for Complete Decongestive Therapy (CDT), which  typically includes manual lymphatic drainage (MLD), skin care to limit' infection risk and increase skin excursion, lymphatic pumping exercise, and during the Intensive Phase multilayer, gradient compression bandaging to reduce limb volume. Once limb volume is reduced as much as possible, Pt is fitted with appropriate compression garments and/ or devices and transitions into the self management phase of care consisting of follow long support PRN.    Pt understands that her fair prognosis will become poor without daily assistance with compression wrapping since she is unable to reach feet and legs to apply them herself. Pt assures OT that she has a friend she believes is willing to assist her between OT sessions. )   OBJECTIVE IMPAIRMENTS: decreased standing and walking tolerance, activity tolerance, decreased balance, decreased knowledge of condition, decreased knowledge of use of DME,  decreased mobility, decreased ROM, decreased strength, increased edema, impaired flexibility, impaired sensation, impaired UE functional use, impaired vision/reception, pain, and chronic, progressive, BLE swelling and associated skin changes, at increased infection risk   ACTIVITY LIMITATIONS: Functional ambulation and mobility (lifting, carrying, steps/stairs, transfers, squatting) , Basic and instrumental ADLs, leisure pursuits, productive activities, social participation  PARTICIPATION LIMITATIONS: meal prep, cleaning, laundry, driving, shopping, yard work,  and    PERSONAL FACTORS: Age, Fitness, Past/current experiences, Time since onset of injury/illness/exacerbation, 2+ co morbidities: OSA, HTN,  are also affecting patient's functional outcome.   REHAB POTENTIAL: Good  EVALUATION COMPLEXITY: Moderate   GOALS: Goals reviewed with patient? Yes  SHORT TERM GOALS: Target date: 4th OT Rx visit   Pt will demonstrate understanding of lymphedema precautions and prevention strategies with modified independence using a printed reference to identify at least 5 precautions and discussing how s/he may implement them into daily life to reduce risk of progression with extra time. Baseline:Max A Goal status: GOAL MET  2.  Pt will be able to apply multilayer, knee length, gradient, compression wraps to one leg at a time from toes to below knee with max caregiver assist to decrease limb volume, to limit infection risk, and to limit lymphedema progression.  Baseline: Dependent Goal status: GOAL MET  LONG TERM GOALS: Target date: 09/19/23  1.Given this patient's Intake score of TBA % on the Lymphedema Life Impact Scale (LLIS), patient will experience a reduction of at least 5 points in her perceived level of functional impairment resulting from lymphedema to improve functional performance and quality of life (QOL). Baseline: TBA % Goal status:  GOAL DEFERRED  2.  Pt will achieve at least a 10% volume  reduction in B legs to return limb to typical size and shape, to limit infection risk and LE progression, to decrease pain, to improve function. Baseline: Dependent Goal status: PROGRESSING. R LEG volume reduced by 14.7% since initially measured on 07/18/21  3.  Pt will obtain appropriate compression garments/devices and achieve modified independence (extra time + assistive devices) with donning/doffing to optimize limb volume reductions and limit LE progression over time. Baseline: Dependent Goal status: PROGRESSING. RLE garment on order  During Intensive phase CDT, with max CG assistance, Pt will achieve at least 85% compliance with all adapted lymphedema self-care home program components, including daily skin care, compression wraps and /or garments, simple self MLD and lymphatic pumping therex to habituate LE self care protocol  into ADLs for optimal LE self-management over time. Baseline: Dependent Goal status: GOAL MET  PLAN:  OT FREQUENCY: 2 x/week  OT DURATION: 12 weeks  PLANNED INTERVENTIONS:compression bandaging, skin care,  97110-Therapeutic exercises, 97530- Therapeutic activity, 97535- Self Care, Manual lymph drainage, DME instructions, and fit with appropriate compression  PLAN FOR NEXT SESSION:  Measure R leg to assess progress towards a stocking, or some alternative Cont Pt edu for LE self-care   Zebedee Dec, MS, OTR/L, CLT-LANA 10/23/23 12:08 PM

## 2023-10-25 ENCOUNTER — Ambulatory Visit

## 2023-10-25 ENCOUNTER — Ambulatory Visit: Admitting: Occupational Therapy

## 2023-10-25 DIAGNOSIS — I890 Lymphedema, not elsewhere classified: Secondary | ICD-10-CM | POA: Diagnosis not present

## 2023-10-25 DIAGNOSIS — R278 Other lack of coordination: Secondary | ICD-10-CM | POA: Diagnosis not present

## 2023-10-25 DIAGNOSIS — M5459 Other low back pain: Secondary | ICD-10-CM | POA: Diagnosis not present

## 2023-10-25 DIAGNOSIS — M6281 Muscle weakness (generalized): Secondary | ICD-10-CM | POA: Diagnosis not present

## 2023-10-25 DIAGNOSIS — M25551 Pain in right hip: Secondary | ICD-10-CM

## 2023-10-25 DIAGNOSIS — R262 Difficulty in walking, not elsewhere classified: Secondary | ICD-10-CM | POA: Diagnosis not present

## 2023-10-25 NOTE — Therapy (Signed)
 OUTPATIENT PHYSICAL THERAPY THORACOLUMBAR TREATMENT   Patient Name: Judith Arnold MRN: [id number] DOB:1950/03/22, 73 y.o., female Today's Date: 10/25/2023  END OF SESSION:  PT End of Session - 10/25/23 1408     Visit Number 7    Number of Visits 25    Date for PT Re-Evaluation 12/12/23    Progress Note Due on Visit 10    PT Start Time 1402    PT Stop Time 1440    PT Time Calculation (min) 38 min    Equipment Utilized During Treatment Gait belt    Activity Tolerance Patient tolerated treatment well;Patient limited by pain    Behavior During Therapy WFL for tasks assessed/performed              Past Medical History:  Diagnosis Date   Allergy    Arthritis    lower back, left shoulder   Asthma    Carpal tunnel syndrome on both sides    Dental bridge present    top   Dental crown present    multiple   Family history of adverse reaction to anesthesia    Father and sister - PONV   GERD (gastroesophageal reflux disease)    Glaucoma    Hypertension    Leaky heart valve    Pre-diabetes    Sleep apnea    Past Surgical History:  Procedure Laterality Date   CATARACT EXTRACTION W/PHACO Right 11/16/2016   Procedure: CATARACT EXTRACTION PHACO AND INTRAOCULAR LENS PLACEMENT (IOC)  right;  Surgeon: Mittie Gaskin, MD;  Location: [facility];  Service: Ophthalmology;  Laterality: Right;  pre diabetic latex sensitivity sleep apnea   CATARACT EXTRACTION W/PHACO Left 01/18/2017   Procedure: CATARACT EXTRACTION PHACO AND INTRAOCULAR LENS PLACEMENT (IOC) LEFT DIABETIC;  Surgeon: Mittie Gaskin, MD;  Location: [facility];  Service: Ophthalmology;  Laterality: Left;   COLONOSCOPY     COLONOSCOPY WITH PROPOFOL  N/A 04/09/2018   Procedure: COLONOSCOPY WITH PROPOFOL ;  Surgeon: Viktoria Lamar DASEN, MD;  Location: [facility];  Service: Endoscopy;  Laterality: N/A;   IRIDOTOMY / IRIDECTOMY Bilateral 2005   TOOTH EXTRACTION     Patient Active Problem List    Diagnosis Date Noted   Chronic bilateral low back pain with right-sided sciatica 09/29/2023   Hypertension associated with diabetes (HCC) 01/20/2023   Combined hyperlipidemia associated with type 2 diabetes mellitus (HCC) 01/20/2023   Intertrigo 11/01/2022   Stasis dermatitis of both legs 10/21/2022   Leg swelling 10/21/2022   Lymphedema of lower extremity 10/21/2022   Type 2 diabetes mellitus without complication, without long-term current use of insulin (HCC) 05/24/2022   Mixed hyperlipidemia 05/24/2022   Essential hypertension, benign 05/24/2022   Absolute anemia 05/24/2022   Gastroesophageal reflux disease without esophagitis 05/24/2022   Allergic rhinitis 05/24/2022   Asthma, allergic, moderate persistent, uncomplicated 05/24/2022   Bilateral chronic angle-closure glaucoma, indeterminate stage 05/24/2022   Airway hyperreactivity 08/29/2013   OSA on CPAP 08/29/2013    PCP: Fernand Fredy RAMAN, MD  REFERRING PROVIDER: Fernand Fredy, RAMAN, MD  REFERRING DIAG:  Diagnosis  G89.29,M54.41 (ICD-10-CM) - Chronic bilateral low back pain with right-sided sciatica    Rationale for Evaluation and Treatment: Rehabilitation  THERAPY DIAG:  Other low back pain  Pain in right hip  Muscle weakness (generalized)  Difficulty in walking, not elsewhere classified  Other lack of coordination  ONSET DATE: >3 years   SUBJECTIVE:  SUBJECTIVE STATEMENT:  Today:  Patient reports might have over done it with her HEP - the clamshell as her groin has been really sore.  Patient reports 5/10 low back pain.    From EVAL: Pt is a pleasant 73 y/o female presenting to PT eval for chronic LBP with R side hip pain. She reports onset of LBP over 3 years ago. Primary site of pain is mostly felt in R hip and lower spine. Pt  reports hx of arthritis in lower back and R hip. She is now starting to feel arthritis pain in her knees as well. Reports RLE is actually her good leg, has some issues with pain into groin region of LLE. Pt states strength in legs has been impacted and that her general mobility is worse. Pt can only ambulate comfortably by taking weight off of lower back by using her 4WW. She states not using 4WW so much for balance, but for reducing pain with gait. She is unable to stand up fully due to pain. Pt says she used to be very fit (took take karate, gymnastics years ago). She is also seeing OT for BLE lymphedema management. She endorses difficulty getting out of chairs, difficulty with steps, she must use a ramp to get in and out of her home. Pt reports my brain does not retain what I'm talking about, can lose train of thought.  PERTINENT HISTORY:  PMH per chart includes arthritis low back and left shoulder, bilateral carpal tunnel, asthma, glaucoma, HTN, leaky heart valve, sleep apnea, pre-diabetes, being seen by lymphedema specialist for BLE lymphedema   PAIN:  Are you having pain? LBP and R hip pain  Lowest pain level: 0/10  With standing: 2/10  Worst: 5-6/10   PRECAUTIONS: LATEX ALLERGY per chart, fall  RED FLAGS: None   WEIGHT BEARING RESTRICTIONS: No  FALLS:  Has patient fallen in last 6 months? No  LIVING ENVIRONMENT: Uses ramp to get into home due to difficulty with steps, has 4WW  PLOF: Independent  PATIENT GOALS:  Says due to back pain she is unable to reach my feet, has difficulty getting her socks on without a grabber, wants to be more flexible and stronger to increase ease with these activities and mobility    OBJECTIVE:  Note: Objective measures were completed at Evaluation unless otherwise noted.  DIAGNOSTIC FINDINGS:  No recent pertinent imaging available in chart  PATIENT SURVEYS:  Modified Oswestry score: 34%  Interpretation of scores: Score Category  Description  0-20% Minimal Disability The patient can cope with most living activities. Usually no treatment is indicated apart from advice on lifting, sitting and exercise  21-40% Moderate Disability The patient experiences more pain and difficulty with sitting, lifting and standing. Travel and social life are more difficult and they may be disabled from work. Personal care, sexual activity and sleeping are not grossly affected, and the patient can usually be managed by conservative means  41-60% Severe Disability Pain remains the main problem in this group, but activities of daily living are affected. These patients require a detailed investigation  61-80% Crippled Back pain impinges on all aspects of the patient's life. Positive intervention is required  81-100% Bed-bound  These patients are either bed-bound or exaggerating their symptoms  Bluford FORBES Zoe DELENA Karon DELENA, et al. Surgery versus conservative management of stable thoracolumbar fracture: the PRESTO feasibility RCT. [place]): [employer]; 2021 Nov. [facility] Technology Assessment, No. 25.62.) Appendix 3, Oswestry Disability Index category descriptors. Available from: FindJewel[employer].cz  Minimally Clinically Important Difference (MCID) = 12.8%  COGNITION: Overall cognitive status: Within functional limits for tasks assessed, very pleasant pt can become tangential and requires redirection to activity    SENSATION: Intact to light touch BLE   MUSCLE LENGTH: deferred  POSTURE: increased thoracic kyphosis, rounded shoulders, unable to achieve full upright position due to pain, maintains increased hip and knee flexion when attempting to stand fully upright   PALPATION: deferred  Thoracolumbar AROM: Extension - unable to achieve neutral position, remains in significant flexion  Flexion - lacking at least 25%  Rotation - lacking at least 40% bilat  and pain-limited    LOWER EXTREMITY MMT:     MMT Right eval Left eval  Hip flexion 4- 3*  Hip extension    Hip abduction 4+ 4+  Hip adduction 4+ 4+  Hip internal rotation    Hip external rotation    Knee flexion 4 3*  Knee extension 4+ 4+  Ankle dorsiflexion 4 4+  Ankle plantarflexion    Ankle inversion    Ankle eversion     (Blank rows = not tested) *=pain limited   LUMBAR SPECIAL TESTS:  deferred  FUNCTIONAL TESTS:  10 meter walk test: 0.63 m/s crouched posture, decreased hip extension, decreased step-length and heel strike, elevated R>L shoulder, heavy BUE weightbearing on 4WW  GAIT: Distance walked: 10MWT/clinic distances (see above) Assistive device utilized: Environmental consultant - 4 wheeled Level of assistance: Modified independence Comments: gait mechanics impaired: decreased gait speed, crouched posture, decreased hip extension, decreased step-length and heel strike, elevated R>L shoulder, heavy BUE weightbearing on 4WW  TREATMENT DATE: 10/25/2023   THEREX:  -Seated Lumbar flex with theraball x 15 reps - then 10 more biased to R then to L. -Standing lumbar flex - hold 30 sec x 2  TA:  for coordination of activity using core +functional activity  - Seated abdominal bracing with hip march x 12 reps each.  -Seated gluteal set with 3 sec hold 2 x 10 reps  -Seated hip adduction squeeze with ball  -Seated core activation with knee ext alt LE 2 x 10 reps each (patient reports difficulty due to OA left knee)  -Standing abdominal bracing with alt Hip ext x 15 reps -Standing abdominal bracing with side stepping  in // bars down and back x 1 -Standing calf raises + abdominal bracing x 15 reps with 2 sec hold                                                                                                              PATIENT EDUCATION:  Education details: Provided education on findings of exam, indications for plan, prognoses, goals, how PT can help                 Person educated: Patient Education method:  Explanation Education comprehension: verbalized understanding  HOME EXERCISE PROGRAM: Access Code: [email or web address] URL: [email or web address] Date: 10/05/2023 Prepared by: Lonni Gainer  Exercises - Supine Bridge  - 3 x weekly - 3 sets - 10 reps - Clamshell  -  3 x weekly - 3 sets - 10 reps - Supine Lower Trunk Rotation  - 1 x daily - 3 sets - 30 sec hold - Seated Hamstring Stretch  - 1 x daily - 3 sets - 30 hold - Seated Clamshell  - 1 x daily - 7 x weekly - 2 sets - 20 reps - 3 sec hold - Supine Transversus Abdominis Bracing - Hands on Stomach  - 3 x weekly - 3 sets - 10 reps - Supine Gluteal Sets  - 3 x weekly - 3 sets - 10 reps  ASSESSMENT:  CLINICAL IMPRESSION:  Patient was able to progress to more standing activities today to coordinate more core +LE strengthening. She was able to respond fairly well- able to perform tasks with VC and visual demonstration- yet continues to be limited by pain and fatigue- requiring brief  seated rest. Pt will continue to benefit from skilled physical therapy intervention to address impairments, improve QOL, and attain therapy goals.    OBJECTIVE IMPAIRMENTS: Abnormal gait, decreased activity tolerance, decreased balance, decreased mobility, difficulty walking, decreased strength, hypomobility, increased edema, improper body mechanics, postural dysfunction, and pain.   ACTIVITY LIMITATIONS: carrying, lifting, bending, squatting, stairs, transfers, bed mobility, and locomotion level  PARTICIPATION LIMITATIONS: meal prep, cleaning, shopping, community activity, and yard work  PERSONAL FACTORS: Age, Fitness, Sex, Time since onset of injury/illness/exacerbation, and 3+ comorbidities: PMH per chart includes arthritis low back and left shoulder, bilateral carpal tunnel, asthma, glaucoma, HTN, leaky heart valve, sleep apnea, pre-diabetes, being seen by lymphedema specialist for BLE lymphedema  are also affecting patient's functional outcome.    REHAB POTENTIAL: Good  CLINICAL DECISION MAKING: Evolving/moderate complexity  EVALUATION COMPLEXITY: Moderate   GOALS: Goals reviewed with patient? Yes   SHORT TERM GOALS: Target date: 10/31/2023    Patient will be independent in home exercise program to improve strength/mobility for better functional independence with ADLs. Baseline:to be initiated  Goal status: INITIAL   LONG TERM GOALS: Target date: 12/12/2023    Patient will reduce modified Oswestry score to <20 as to demonstrate minimal disability with ADLs including improved sleeping tolerance, walking/sitting tolerance etc for better mobility with ADLs.  Baseline: 34 Goal status: INITIAL  2.  Patient (> 10 years old) will complete five times sit to stand test in < 15 seconds indicating an increased LE strength and improved balance. Baseline: 09/26/2023= 26.46 sec with heavy UE Support Goal status: INITIAL  3.  Patient will increase Berg Balance score by > 6 points to demonstrate decreased fall risk during functional activities Baseline: 09/26/2023= 31/56 Goal status: INITIAL  4.  Patient will increase 10 meter walk test to >1.48m/s as to improve gait speed for better community ambulation and to reduce fall risk. Baseline:  0.63 m/s with 4WW Goal status: INITIAL      PLAN:  PT FREQUENCY: 1-2x/week  PT DURATION: 12 weeks  PLANNED INTERVENTIONS: [id number]- PT Re-evaluation, 97750- Physical Performance Testing, 97110-Therapeutic exercises, 97530- Therapeutic activity, [id number]- Neuromuscular re-education, 97535- Self Care, [id number]- Manual therapy, [id number]- Gait training, [phone]- Orthotic Initial, [phone]- Orthotic/Prosthetic subsequent, [phone]- Canalith repositioning, Patient/Family education, Balance training, Stair training, Taping, Joint mobilization, Spinal mobilization, Vestibular training, DME instructions, Cryotherapy, and Moist heat.  PLAN FOR NEXT SESSION:   Continue to progress HEP Manual therapy for lumbar  ROM/pain Therex for ROM, Low back/LE/Core strengthening Ensure log roll is being performed properly    Reyes LOISE London, PT 10/25/2023, 2:47 PM

## 2023-10-26 ENCOUNTER — Telehealth: Payer: Self-pay | Admitting: Internal Medicine

## 2023-10-26 NOTE — Therapy (Signed)
 OUTPATIENT OCCUPATIONAL THERAPY TREATMENT NOTE  BILATERAL LOWER EXTREMITY LYMPHEDEMA  Patient Name: Judith Arnold MRN: [id number] DOB:1950-08-21, 73 y.o., female Today's Date: 10/26/2023  REPORTING PERIOD:   END OF SESSION:   OT End of Session - 10/25/23 1302     Visit Number 25    Number of Visits 36    Date for OT Re-Evaluation 01/16/24    OT Start Time 0100    OT Stop Time 0200    OT Time Calculation (min) 60 min    Equipment Utilized During Treatment sock donner; compression garment samples    Activity Tolerance Patient tolerated treatment well;No increased pain    Behavior During Therapy WFL for tasks assessed/performed           Past Medical History:  Diagnosis Date   Allergy    Arthritis    lower back, left shoulder   Asthma    Carpal tunnel syndrome on both sides    Dental bridge present    top   Dental crown present    multiple   Family history of adverse reaction to anesthesia    Father and sister - PONV   GERD (gastroesophageal reflux disease)    Glaucoma    Hypertension    Leaky heart valve    Pre-diabetes    Sleep apnea    Past Surgical History:  Procedure Laterality Date   CATARACT EXTRACTION W/PHACO Right 11/16/2016   Procedure: CATARACT EXTRACTION PHACO AND INTRAOCULAR LENS PLACEMENT (IOC)  right;  Surgeon: Mittie Gaskin, MD;  Location: [facility];  Service: Ophthalmology;  Laterality: Right;  pre diabetic latex sensitivity sleep apnea   CATARACT EXTRACTION W/PHACO Left 01/18/2017   Procedure: CATARACT EXTRACTION PHACO AND INTRAOCULAR LENS PLACEMENT (IOC) LEFT DIABETIC;  Surgeon: Mittie Gaskin, MD;  Location: [facility];  Service: Ophthalmology;  Laterality: Left;   COLONOSCOPY     COLONOSCOPY WITH PROPOFOL  N/A 04/09/2018   Procedure: COLONOSCOPY WITH PROPOFOL ;  Surgeon: Viktoria Lamar DASEN, MD;  Location: [facility];  Service: Endoscopy;  Laterality: N/A;   IRIDOTOMY / IRIDECTOMY Bilateral 2005   TOOTH  EXTRACTION     Patient Active Problem List   Diagnosis Date Noted   Chronic bilateral low back pain with right-sided sciatica 09/29/2023   Hypertension associated with diabetes (HCC) 01/20/2023   Combined hyperlipidemia associated with type 2 diabetes mellitus (HCC) 01/20/2023   Intertrigo 11/01/2022   Stasis dermatitis of both legs 10/21/2022   Leg swelling 10/21/2022   Lymphedema of lower extremity 10/21/2022   Type 2 diabetes mellitus without complication, without long-term current use of insulin (HCC) 05/24/2022   Mixed hyperlipidemia 05/24/2022   Essential hypertension, benign 05/24/2022   Absolute anemia 05/24/2022   Gastroesophageal reflux disease without esophagitis 05/24/2022   Allergic rhinitis 05/24/2022   Asthma, allergic, moderate persistent, uncomplicated 05/24/2022   Bilateral chronic angle-closure glaucoma, indeterminate stage 05/24/2022   Airway hyperreactivity 08/29/2013   OSA on CPAP 08/29/2013    PCP: Fredy GORMAN Bathe, MD  REFERRING PROVIDER: same  REFERRING DIAG: lymphedema I89.0  THERAPY DIAG:  Lymphedema, not elsewhere classified  Rationale for Evaluation and Treatment: Rehabilitation  ONSET DATE: Pt reports onset of L ankle / leg swelling after injury 20-30 yrs ago. Then about 1 year ago legs started swelling and Pt having prickly sensation on shins and thighs bilaterally.   SUBJECTIVE:  SUBJECTIVE STATEMENT: Judith Arnold presents to OT for treatment of BLE lymphedema 2/2 suspected CVI and obesity. Pt is unaccompanied today. Pt denies LE related pain today. She brings clean wraps to clinic rolled and ready to re apply. She tells me she still has not heard from the DME provider re cost of her garments. She called DME vendor and left VM during her appointment. Pt reports she  had an episode of numbness in her R lower extremity during interval that started in the foot and eventually extended to the upper thigh. Consequently Pt opted not to apply compression wraps over the past 24 hours. RLE is obviously more swollen today, especially around distal leg and ankle.   (INITIAL EVAL 07/12/23 : Shavaughn Seidl is referred to Occupational Therapy by Fredy GORMAN Bathe, MD,  for evaluation and treatment of BLE lymphedema.  Pt reports onset of L ankle and distal leg swelling about 30 years ago, then noticed RLE swelling and worsening LLE swelling about 1 year ago at same time she started having unusual sensation in legs. Pt denies previously having lymphedema treatment. Pt reports paternal aunt had leg swelling. Pt is unable to wear compression stockings because she is unable to reach feet and distal legs to don and doff them due to back and hip pain.  PERTINENT HISTORY: Relevant to lymphedema (OSA (denies using CPAP), BLE stasis dermatitis, HTN, DM 2 (Pt states she is prediabetic only), B glaucoma, Back and R hip OA  PAIN:  Are you having LE-related pain? Yes: NPRS scale: 0/10 Pain location: medial proximal thighs w abduction Pain description: like stickers Aggravating factors: standing, walking, dependent sitting Relieving factors: elevation  PRECAUTIONS: Fall and Other: LYMPHEDEMA precautions  (DM 2 and asthma)  WEIGHT BEARING RESTRICTIONS: No  FALLS:  Has patient fallen in last 6 months? yes Fell asleep at the computer and fell onto the floor [place] when storm door hit me  LIVING ENVIRONMENT: Lives with: alone Lives in: House/apartment Stairs: No;  Has following equipment at home: Environmental consultant - 4 wheeled  OCCUPATION: retired Scientist, research (life sciences)  LEISURE: reading and taking notes on current events and politics  HAND DOMINANCE: right   PRIOR LEVEL OF FUNCTION: Independent with household mobility with device, Requires assistive device for independence, Needs  assistance with ADLs, Needs assistance with homemaking, and Needs assistance with gait  PATIENT GOALS: reduce swelling and keep it from getting worse  OBJECTIVE: Note: Objective measures were completed at Evaluation unless otherwise noted.  COGNITION:  Overall cognitive status: impaired short term  memory   OBSERVATIONS / OTHER ASSESSMENTS: Mild, Stage II, BLE Lymphedema 2/2 suspected venous insufficiency.   SENSATION: Reports uncomfortable scratching, or sticker-like  sensation on anterior thighs   POSTURE: Raised walker handles 2 notches to increase upright spinal alignment when walking to limit falls risk. Handles need to be raised at least 1 hole more for safe upright posture when walking.  Upright sitting posture: head forward , shoulders forward and rounded- appears to be flexible kyphosis  LE ROM: WFL for ankles and knees. Pt reports limited hip abduction )  LE MMT: WFL FOR LYMPHEDEMA CARE. Presenting with generalized weakness and Pt reports debility  LYMPHEDEMA ASSESSMENTS:   SURGERY TYPE/DATE: Non-cancer related limb swelling  HX INFECTIONS: positive for 1 episode cellulitis treated w antibiotics  Hx WOUNDS: denies   BLE COMPARATIVE LIMB VOLUMETRICS: INITIAL 07/19/23  LANDMARK RIGHT (dominant)  R LEG (A-D) 3525.1 ml  R THIGH (E-G) ml  R FULL LIMB (A-G) ml  Limb Volume differential (LVD)  %  Volume change since initial %  Volume change overall V  (Blank rows = not tested)  LANDMARK LEFT    L LEG (A-D) 3769.6 ml  L THIGH (E-G) ml  L FULL LIMB (A-G) ml  Limb Volume differential (LVD)  Limb Volume Differential (LVD) measures 6.5%, L>R.  Volume change since initial %  Volume change overall %    RLE COMPARATIVE LIMB VOLUMETRICS: VISIT 9 08/15/23  LANDMARK RIGHT (dominant)  R LEG (A-D) 3549.4 ml  R THIGH (E-G) ml  R FULL LIMB (A-G) ml  Limb Volume differential (LVD)  %  Volume change since initial R LEG volume reduced by 5.84% since initially measured on  07/18/21  Volume change overall V  (Blank rows = not tested)  RLE COMPARATIVE LIMB VOLUMETRICS: VISIT 19 10/03/23  LANDMARK RIGHT (dominant)  R LEG (A-D) 3549.4 ml  R THIGH (E-G) ml  R FULL LIMB (A-G) ml  Limb Volume differential (LVD)  %  Volume change since initial R LEG volume reduced by 14.7% since initially measured on 07/18/21  Volume change overall V  (Blank rows = not tested)  Mild, Stage  II, Bilateral Lower Extremity Lymphedema 2/2 CVI and Obesity  Skin  Description Hyper-Keratosis Peau' de Orange Shiny Tight Fibrotic/ Indurated Fatty Doughy Spongy/ boggy       R>L x  x   Skin dry Flaky WNL Macerated   mildly      Color Redness Varicosities Blanching Hemosiderin Stain Mottled   x     x   Odor Malodorous Yeast Fungal infection  WNL      x   Temperature Warm Cool wnl    x     Pitting Edema   1+ 2+ 3+ 4+ Non-pitting         x   Girth Symmetrical Asymmetrical                   Distribution    R>L toes to groin    Stemmer Sign Positive Negative   +    Lymphorrhea History Of:  Present Absent     x    Wounds History Of Present Absent Venous Arterial Pressure Sheer     x        Signs of Infection Redness Warmth Erythema Acute Swelling Drainage Borders                    Sensation Light Touch Deep pressure Hypersensitivity   Present Impaired Present Impaired Absent Impaired   x Tactile  x  x     Nails WNL   Fungus nail dystrophy   x     Hair Growth Symmetrical Asymmetrical   x    Skin Creases Base of toes  Ankles   Base of Fingers knees       Abdominal pannus Thigh Lobules  Face/neck   x x  x      (Blank rows = not tested)    GAIT: Distance walked: >500' Assistive device utilized: Environmental consultant - 4 wheeled Level of assistance: Modified independence Comments: Pt bent at waist nearly 90 degrees with walker handles in lowest position  LYMPHEDEMA LIFE IMPACT SCALE (LLIS): TBA OT Rx visit 1  TREATMENT DATE:  RLE/RLQ MLD and simultaneous  skin care RLE multilayer compression bandaging Pt edu for lymphedema progress towards goals and self-care    PATIENT EDUCATION:  Continued Pt/ CG edu for lymphedema self care home program throughout session. Topics  include outcome of comparative limb volumetrics- starting limb volume differentials (LVDs), technology and gradient techniques used for short stretch, multilayer compression wrapping, simple self-MLD, therapeutic lymphatic pumping exercises, skin/nail care, LE precautions, compression garment recommendations and specifications, wear and care schedule and compression garment donning / doffing w assistive devices. Discussed progress towards all OT goals since commencing CDT. Discussed detrimental impact of obesity on lower and upper extremity lymphedema over time. Reviewed OT goals for lymphedema care with Pt and discussed progress to date.  All questions answered to the Pt's satisfaction. Good return. Person educated: Patient  Education method: Explanation, Demonstration, and Handouts Education comprehension: verbalized understanding, returned demonstration, verbal cues required, and needs further education   HOME EXERCISE PROGRAM: BLE lymphatic pumping there ex using- 1 sets of 10 reps, each exercise in order-  1-2 x daily, bilaterally Simple self MLD 1 x daily Daily skin care to increase hydration, skin mobility and decrease infection risk- can be done during MLD During Intensive Phase CDT: Compression wraps 23/7 until limb volume reduction complete During Self-management Phase CDT: Fit with appropriate compression garments or alternatives. Consider BLE, knee length, Mediven, custom, CircAid , Velcro style leggings over soft cotton liners.  ASSESSMENT: CLINICAL IMPRESSION:  Continued MLD with simultaneous skin care to RLE/RLQ today without pain. Re applied compression wraps to RLE as established without pain or numbness. Garment alternatives are on order. Awaiting DME vendor to  contact Pt so order and fitting can proceed. Cont as per POC.  (INITIAL EVAL 07/12/23: Genevive Graylin Pizza is a 73 yo female presenting with chronic, progressive, BLE swelling 2/2 unknown etiology, which at first glance appears to be circulatory in nature due to skin coloring at distal legs where swelling is most invested. Pt is presently unable to wear traditional elastic compression garments due to difficulty donning and doffing them.  Chronic, progressive lymphedema with associated skin changes, including fibrosis, limits this patent's functional performance in all occupational domains, including functional ambulation and mobility, basic and instrumental ADLs (lower body dressing, LB bathing, fitting street shoes and LB clothing, driving, shopping, and home management. It also limits perform productive activities, leisure pursuits, and participation in social and community activities. BLE lymphedema contributes to elevated infection risk. Pt will benefit from skilled OT for Complete Decongestive Therapy (CDT), which  typically includes manual lymphatic drainage (MLD), skin care to limit' infection risk and increase skin excursion, lymphatic pumping exercise, and during the Intensive Phase multilayer, gradient compression bandaging to reduce limb volume. Once limb volume is reduced as much as possible, Pt is fitted with appropriate compression garments and/ or devices and transitions into the self management phase of care consisting of follow long support PRN.    Pt understands that her fair prognosis will become poor without daily assistance with compression wrapping since she is unable to reach feet and legs to apply them herself. Pt assures OT that she has a friend she believes is willing to assist her between OT sessions. )   OBJECTIVE IMPAIRMENTS: decreased standing and walking tolerance, activity tolerance, decreased balance, decreased knowledge of condition, decreased knowledge of use of DME,  decreased mobility, decreased ROM, decreased strength, increased edema, impaired flexibility, impaired sensation, impaired UE functional use, impaired vision/reception, pain, and chronic, progressive, BLE swelling and associated skin changes, at increased infection risk   ACTIVITY LIMITATIONS: Functional ambulation and mobility (lifting, carrying, steps/stairs, transfers, squatting) , Basic and instrumental ADLs, leisure pursuits, productive activities, social participation  PARTICIPATION LIMITATIONS: meal prep, cleaning, laundry, driving, shopping, yard work,  and    PERSONAL FACTORS: Age, Fitness, Past/current experiences, Time since onset of injury/illness/exacerbation, 2+ co morbidities: OSA, HTN,  are also affecting patient's functional outcome.   REHAB POTENTIAL: Good  EVALUATION COMPLEXITY: Moderate   GOALS: Goals reviewed with patient? Yes  SHORT TERM GOALS: Target date: 4th OT Rx visit   Pt will demonstrate understanding of lymphedema precautions and prevention strategies with modified independence using a printed reference to identify at least 5 precautions and discussing how s/he may implement them into daily life to reduce risk of progression with extra time. Baseline:Max A Goal status: GOAL MET  2.  Pt will be able to apply multilayer, knee length, gradient, compression wraps to one leg at a time from toes to below knee with max caregiver assist to decrease limb volume, to limit infection risk, and to limit lymphedema progression.  Baseline: Dependent Goal status: GOAL MET  LONG TERM GOALS: Target date: 09/19/23  1.Given this patient's Intake score of TBA % on the Lymphedema Life Impact Scale (LLIS), patient will experience a reduction of at least 5 points in her perceived level of functional impairment resulting from lymphedema to improve functional performance and quality of life (QOL). Baseline: TBA % Goal status:  GOAL DEFERRED  2.  Pt will achieve at least a 10% volume  reduction in B legs to return limb to typical size and shape, to limit infection risk and LE progression, to decrease pain, to improve function. Baseline: Dependent Goal status: PROGRESSING. R LEG volume reduced by 14.7% since initially measured on 07/18/21  3.  Pt will obtain appropriate compression garments/devices and achieve modified independence (extra time + assistive devices) with donning/doffing to optimize limb volume reductions and limit LE progression over time. Baseline: Dependent Goal status: PROGRESSING. RLE garment on order  During Intensive phase CDT, with max CG assistance, Pt will achieve at least 85% compliance with all adapted lymphedema self-care home program components, including daily skin care, compression wraps and /or garments, simple self MLD and lymphatic pumping therex to habituate LE self care protocol  into ADLs for optimal LE self-management over time. Baseline: Dependent Goal status: GOAL MET  PLAN:  OT FREQUENCY: 2 x/week  OT DURATION: 12 weeks  PLANNED INTERVENTIONS:compression bandaging, skin care,  97110-Therapeutic exercises, 97530- Therapeutic activity, 97535- Self Care, Manual lymph drainage, DME instructions, and fit with appropriate compression  PLAN FOR NEXT SESSION:  Measure R leg to assess progress towards a stocking, or some alternative Cont Pt edu for LE self-care   Zebedee Dec, MS, OTR/L, CLT-LANA 10/26/23 1:03 PM

## 2023-10-26 NOTE — Telephone Encounter (Signed)
 PT lvm stating she hasn't heard back about her compression garments, wants status update

## 2023-10-30 ENCOUNTER — Ambulatory Visit

## 2023-10-30 ENCOUNTER — Ambulatory Visit: Admitting: Occupational Therapy

## 2023-10-30 DIAGNOSIS — M5459 Other low back pain: Secondary | ICD-10-CM

## 2023-10-30 DIAGNOSIS — R278 Other lack of coordination: Secondary | ICD-10-CM

## 2023-10-30 DIAGNOSIS — M25551 Pain in right hip: Secondary | ICD-10-CM | POA: Diagnosis not present

## 2023-10-30 DIAGNOSIS — I890 Lymphedema, not elsewhere classified: Secondary | ICD-10-CM | POA: Diagnosis not present

## 2023-10-30 DIAGNOSIS — M6281 Muscle weakness (generalized): Secondary | ICD-10-CM

## 2023-10-30 DIAGNOSIS — R262 Difficulty in walking, not elsewhere classified: Secondary | ICD-10-CM | POA: Diagnosis not present

## 2023-10-30 NOTE — Therapy (Signed)
 OUTPATIENT PHYSICAL THERAPY THORACOLUMBAR TREATMENT   Patient Name: TAMIKA SHROPSHIRE MRN: [id number] DOB:[birth year], 73 y.o., female Today's Date: 10/30/2023  END OF SESSION:  PT End of Session - 10/30/23 1034     Visit Number 8    Number of Visits 25    Date for PT Re-Evaluation 12/12/23    Progress Note Due on Visit 10    PT Start Time 1017    PT Stop Time 1100    PT Time Calculation (min) 43 min    Equipment Utilized During Treatment Gait belt    Activity Tolerance Patient tolerated treatment well;Patient limited by pain    Behavior During Therapy WFL for tasks assessed/performed               Past Medical History:  Diagnosis Date   Allergy    Arthritis    lower back, left shoulder   Asthma    Carpal tunnel syndrome on both sides    Dental bridge present    top   Dental crown present    multiple   Family history of adverse reaction to anesthesia    Father and sister - PONV   GERD (gastroesophageal reflux disease)    Glaucoma    Hypertension    Leaky heart valve    Pre-diabetes    Sleep apnea    Past Surgical History:  Procedure Laterality Date   CATARACT EXTRACTION W/PHACO Right 11/16/2016   Procedure: CATARACT EXTRACTION PHACO AND INTRAOCULAR LENS PLACEMENT (IOC)  right;  Surgeon: Mittie Gaskin, MD;  Location: [facility];  Service: Ophthalmology;  Laterality: Right;  pre diabetic latex sensitivity sleep apnea   CATARACT EXTRACTION W/PHACO Left 01/18/2017   Procedure: CATARACT EXTRACTION PHACO AND INTRAOCULAR LENS PLACEMENT (IOC) LEFT DIABETIC;  Surgeon: Mittie Gaskin, MD;  Location: [facility];  Service: Ophthalmology;  Laterality: Left;   COLONOSCOPY     COLONOSCOPY WITH PROPOFOL  N/A 04/09/2018   Procedure: COLONOSCOPY WITH PROPOFOL ;  Surgeon: Viktoria Lamar DASEN, MD;  Location: [facility];  Service: Endoscopy;  Laterality: N/A;   IRIDOTOMY / IRIDECTOMY Bilateral 2005   TOOTH EXTRACTION     Patient Active Problem List    Diagnosis Date Noted   Chronic bilateral low back pain with right-sided sciatica 09/29/2023   Hypertension associated with diabetes (HCC) 01/20/2023   Combined hyperlipidemia associated with type 2 diabetes mellitus (HCC) 01/20/2023   Intertrigo 11/01/2022   Stasis dermatitis of both legs 10/21/2022   Leg swelling 10/21/2022   Lymphedema of lower extremity 10/21/2022   Type 2 diabetes mellitus without complication, without long-term current use of insulin (HCC) 05/24/2022   Mixed hyperlipidemia 05/24/2022   Essential hypertension, benign 05/24/2022   Absolute anemia 05/24/2022   Gastroesophageal reflux disease without esophagitis 05/24/2022   Allergic rhinitis 05/24/2022   Asthma, allergic, moderate persistent, uncomplicated 05/24/2022   Bilateral chronic angle-closure glaucoma, indeterminate stage 05/24/2022   Airway hyperreactivity 08/29/2013   OSA on CPAP 08/29/2013    PCP: Fernand Fredy RAMAN, MD  REFERRING PROVIDER: Fernand Fredy, RAMAN, MD  REFERRING DIAG:  Diagnosis  G89.29,M54.41 (ICD-10-CM) - Chronic bilateral low back pain with right-sided sciatica    Rationale for Evaluation and Treatment: Rehabilitation  THERAPY DIAG:  Other low back pain  Pain in right hip  Muscle weakness (generalized)  Difficulty in walking, not elsewhere classified  Other lack of coordination  ONSET DATE: >3 years   SUBJECTIVE:  SUBJECTIVE STATEMENT:  Today:  Patient states doing okay. States the bed rail did not fit well so just going to donate it.   Patient reports 3/10 low back pain.    From EVAL: Pt is a pleasant 73 y/o female presenting to PT eval for chronic LBP with R side hip pain. She reports onset of LBP over 3 years ago. Primary site of pain is mostly felt in R hip and lower spine. Pt reports hx  of arthritis in lower back and R hip. She is now starting to feel arthritis pain in her knees as well. Reports RLE is actually her good leg, has some issues with pain into groin region of LLE. Pt states strength in legs has been impacted and that her general mobility is worse. Pt can only ambulate comfortably by taking weight off of lower back by using her 4WW. She states not using 4WW so much for balance, but for reducing pain with gait. She is unable to stand up fully due to pain. Pt says she used to be very fit (took take karate, gymnastics years ago). She is also seeing OT for BLE lymphedema management. She endorses difficulty getting out of chairs, difficulty with steps, she must use a ramp to get in and out of her home. Pt reports my brain does not retain what I'm talking about, can lose train of thought.  PERTINENT HISTORY:  PMH per chart includes arthritis low back and left shoulder, bilateral carpal tunnel, asthma, glaucoma, HTN, leaky heart valve, sleep apnea, pre-diabetes, being seen by lymphedema specialist for BLE lymphedema   PAIN:  Are you having pain? LBP and R hip pain  Lowest pain level: 0/10  With standing: 2/10  Worst: 5-6/10   PRECAUTIONS: LATEX ALLERGY per chart, fall  RED FLAGS: None   WEIGHT BEARING RESTRICTIONS: No  FALLS:  Has patient fallen in last 6 months? No  LIVING ENVIRONMENT: Uses ramp to get into home due to difficulty with steps, has 4WW  PLOF: Independent  PATIENT GOALS:  Says due to back pain she is unable to reach my feet, has difficulty getting her socks on without a grabber, wants to be more flexible and stronger to increase ease with these activities and mobility    OBJECTIVE:  Note: Objective measures were completed at Evaluation unless otherwise noted.  DIAGNOSTIC FINDINGS:  No recent pertinent imaging available in chart  PATIENT SURVEYS:  Modified Oswestry score: 34%  Interpretation of scores: Score Category Description  0-20%  Minimal Disability The patient can cope with most living activities. Usually no treatment is indicated apart from advice on lifting, sitting and exercise  21-40% Moderate Disability The patient experiences more pain and difficulty with sitting, lifting and standing. Travel and social life are more difficult and they may be disabled from work. Personal care, sexual activity and sleeping are not grossly affected, and the patient can usually be managed by conservative means  41-60% Severe Disability Pain remains the main problem in this group, but activities of daily living are affected. These patients require a detailed investigation  61-80% Crippled Back pain impinges on all aspects of the patient's life. Positive intervention is required  81-100% Bed-bound  These patients are either bed-bound or exaggerating their symptoms  Bluford FORBES Zoe DELENA Karon DELENA, et al. Surgery versus conservative management of stable thoracolumbar fracture: the PRESTO feasibility RCT. [place]): [employer]; 2021 Nov. [facility] Technology Assessment, No. 25.62.) Appendix 3, Oswestry Disability Index category descriptors. Available from: [employer]el[employer]cz  Minimally Clinically Important Difference (MCID) = 12.8%  COGNITION: Overall cognitive status: Within functional limits for tasks assessed, very pleasant pt can become tangential and requires redirection to activity    SENSATION: Intact to light touch BLE   MUSCLE LENGTH: deferred  POSTURE: increased thoracic kyphosis, rounded shoulders, unable to achieve full upright position due to pain, maintains increased hip and knee flexion when attempting to stand fully upright   PALPATION: deferred  Thoracolumbar AROM: Extension - unable to achieve neutral position, remains in significant flexion  Flexion - lacking at least 25%  Rotation - lacking at least 40% bilat  and pain-limited    LOWER EXTREMITY MMT:    MMT  Right eval Left eval  Hip flexion 4- 3*  Hip extension    Hip abduction 4+ 4+  Hip adduction 4+ 4+  Hip internal rotation    Hip external rotation    Knee flexion 4 3*  Knee extension 4+ 4+  Ankle dorsiflexion 4 4+  Ankle plantarflexion    Ankle inversion    Ankle eversion     (Blank rows = not tested) *=pain limited   LUMBAR SPECIAL TESTS:  deferred  FUNCTIONAL TESTS:  10 meter walk test: 0.63 m/s crouched posture, decreased hip extension, decreased step-length and heel strike, elevated R>L shoulder, heavy BUE weightbearing on 4WW  GAIT: Distance walked: 10MWT/clinic distances (see above) Assistive device utilized: Environmental consultant - 4 wheeled Level of assistance: Modified independence Comments: gait mechanics impaired: decreased gait speed, crouched posture, decreased hip extension, decreased step-length and heel strike, elevated R>L shoulder, heavy BUE weightbearing on 4WW  TREATMENT DATE: 10/30/2023   THEREX:  -Seated Lumbar flex x 30 sec x 3  -Seated hamstring stretch x 30 sec x each LE -attempted standing hip flexor stretch- hold 10 sec x 3. *pain in Left groin   TA:  for coordination of activity using core +functional activity   - Seated abdominal bracing with hip march alt LE x 10 reps each.  -Standing step up with abd bracing x 10 with RLE -Standing step tap with abd bracing x 10 with LE (pain limited)  -Standing hip ext- alt LE with core stabilization x 10 reps  -Seated hip adduction squeeze with ball x 10  -Seated core activation with knee ext alt LE 2 x 10 reps each -Standing hip abd +abd bracing x 10 reps each LE  -Standing calf raises + abdominal bracing x 15 reps with 2 sec hold                                                                                                              PATIENT EDUCATION:  Education details: Provided education on findings of exam, indications for plan, prognoses, goals, how PT can help                 Person educated:  Patient Education method: Explanation Education comprehension: verbalized understanding  HOME EXERCISE PROGRAM: Access Code: [email or web address] URL: [email or web address] Date: 10/05/2023 Prepared by: Lonni Gainer  Exercises - Supine Bridge  -  3 x weekly - 3 sets - 10 reps - Clamshell  - 3 x weekly - 3 sets - 10 reps - Supine Lower Trunk Rotation  - 1 x daily - 3 sets - 30 sec hold - Seated Hamstring Stretch  - 1 x daily - 3 sets - 30 hold - Seated Clamshell  - 1 x daily - 7 x weekly - 2 sets - 20 reps - 3 sec hold - Supine Transversus Abdominis Bracing - Hands on Stomach  - 3 x weekly - 3 sets - 10 reps - Supine Gluteal Sets  - 3 x weekly - 3 sets - 10 reps  ASSESSMENT:  CLINICAL IMPRESSION: Patient remains very limited due to pain in left LE (groin and lateral hip depending on activity) but overall able to perform some weight bearing activities. She was able to stand on left LE better to perform some dynamic RLE activities yet still very painful with LLE active movement.  Pt will continue to benefit from skilled physical therapy intervention to address impairments, improve QOL, and attain therapy goals.    OBJECTIVE IMPAIRMENTS: Abnormal gait, decreased activity tolerance, decreased balance, decreased mobility, difficulty walking, decreased strength, hypomobility, increased edema, improper body mechanics, postural dysfunction, and pain.   ACTIVITY LIMITATIONS: carrying, lifting, bending, squatting, stairs, transfers, bed mobility, and locomotion level  PARTICIPATION LIMITATIONS: meal prep, cleaning, shopping, community activity, and yard work  PERSONAL FACTORS: Age, Fitness, Sex, Time since onset of injury/illness/exacerbation, and 3+ comorbidities: PMH per chart includes arthritis low back and left shoulder, bilateral carpal tunnel, asthma, glaucoma, HTN, leaky heart valve, sleep apnea, pre-diabetes, being seen by lymphedema specialist for BLE lymphedema  are also affecting  patient's functional outcome.   REHAB POTENTIAL: Good  CLINICAL DECISION MAKING: Evolving/moderate complexity  EVALUATION COMPLEXITY: Moderate   GOALS: Goals reviewed with patient? Yes   SHORT TERM GOALS: Target date: 10/31/2023    Patient will be independent in home exercise program to improve strength/mobility for better functional independence with ADLs. Baseline:to be initiated  Goal status: INITIAL   LONG TERM GOALS: Target date: 12/12/2023    Patient will reduce modified Oswestry score to <20 as to demonstrate minimal disability with ADLs including improved sleeping tolerance, walking/sitting tolerance etc for better mobility with ADLs.  Baseline: 34 Goal status: INITIAL  2.  Patient (> 35 years old) will complete five times sit to stand test in < 15 seconds indicating an increased LE strength and improved balance. Baseline: 09/26/2023= 26.46 sec with heavy UE Support Goal status: INITIAL  3.  Patient will increase Berg Balance score by > 6 points to demonstrate decreased fall risk during functional activities Baseline: 09/26/2023= 31/56 Goal status: INITIAL  4.  Patient will increase 10 meter walk test to >1.41m/s as to improve gait speed for better community ambulation and to reduce fall risk. Baseline:  0.63 m/s with 4WW Goal status: INITIAL      PLAN:  PT FREQUENCY: 1-2x/week  PT DURATION: 12 weeks  PLANNED INTERVENTIONS: [id number]- PT Re-evaluation, 97750- Physical Performance Testing, 97110-Therapeutic exercises, 97530- Therapeutic activity, [id number]- Neuromuscular re-education, 97535- Self Care, [id number]- Manual therapy, [id number]- Gait training, [phone]- Orthotic Initial, [phone]- Orthotic/Prosthetic subsequent, [phone]- Canalith repositioning, Patient/Family education, Balance training, Stair training, Taping, Joint mobilization, Spinal mobilization, Vestibular training, DME instructions, Cryotherapy, and Moist heat.  PLAN FOR NEXT SESSION:   Continue to progress  HEP Manual therapy for lumbar ROM/pain Therex for ROM, Low back/LE/Core strengthening Ensure log roll is being performed properly    [employer]  Karas Pickerill, PT 10/30/2023, 1:16 PM

## 2023-10-30 NOTE — Therapy (Signed)
 OUTPATIENT OCCUPATIONAL THERAPY TREATMENT NOTE  BILATERAL LOWER EXTREMITY LYMPHEDEMA  Patient Name: Judith Arnold MRN: [id number] DOB:05/12/1950, 73 y.o., female Today's Date: 10/30/2023  REPORTING PERIOD:   END OF SESSION:   OT End of Session - 10/30/23 0915     Visit Number 26    Number of Visits 36    Date for OT Re-Evaluation 01/16/24    OT Start Time 0900    OT Stop Time 1009    OT Time Calculation (min) 69 min    Equipment Utilized During Treatment --    Activity Tolerance Patient tolerated treatment well;No increased pain    Behavior During Therapy WFL for tasks assessed/performed           Past Medical History:  Diagnosis Date   Allergy    Arthritis    lower back, left shoulder   Asthma    Carpal tunnel syndrome on both sides    Dental bridge present    top   Dental crown present    multiple   Family history of adverse reaction to anesthesia    Father and sister - PONV   GERD (gastroesophageal reflux disease)    Glaucoma    Hypertension    Leaky heart valve    Pre-diabetes    Sleep apnea    Past Surgical History:  Procedure Laterality Date   CATARACT EXTRACTION W/PHACO Right 11/16/2016   Procedure: CATARACT EXTRACTION PHACO AND INTRAOCULAR LENS PLACEMENT (IOC)  right;  Surgeon: Mittie Gaskin, MD;  Location: [facility];  Service: Ophthalmology;  Laterality: Right;  pre diabetic latex sensitivity sleep apnea   CATARACT EXTRACTION W/PHACO Left 01/18/2017   Procedure: CATARACT EXTRACTION PHACO AND INTRAOCULAR LENS PLACEMENT (IOC) LEFT DIABETIC;  Surgeon: Mittie Gaskin, MD;  Location: [facility];  Service: Ophthalmology;  Laterality: Left;   COLONOSCOPY     COLONOSCOPY WITH PROPOFOL  N/A 04/09/2018   Procedure: COLONOSCOPY WITH PROPOFOL ;  Surgeon: Viktoria Lamar DASEN, MD;  Location: [facility];  Service: Endoscopy;  Laterality: N/A;   IRIDOTOMY / IRIDECTOMY Bilateral 2005   TOOTH EXTRACTION     Patient Active Problem  List   Diagnosis Date Noted   Chronic bilateral low back pain with right-sided sciatica 09/29/2023   Hypertension associated with diabetes (HCC) 01/20/2023   Combined hyperlipidemia associated with type 2 diabetes mellitus (HCC) 01/20/2023   Intertrigo 11/01/2022   Stasis dermatitis of both legs 10/21/2022   Leg swelling 10/21/2022   Lymphedema of lower extremity 10/21/2022   Type 2 diabetes mellitus without complication, without long-term current use of insulin (HCC) 05/24/2022   Mixed hyperlipidemia 05/24/2022   Essential hypertension, benign 05/24/2022   Absolute anemia 05/24/2022   Gastroesophageal reflux disease without esophagitis 05/24/2022   Allergic rhinitis 05/24/2022   Asthma, allergic, moderate persistent, uncomplicated 05/24/2022   Bilateral chronic angle-closure glaucoma, indeterminate stage 05/24/2022   Airway hyperreactivity 08/29/2013   OSA on CPAP 08/29/2013    PCP: Fredy GORMAN Bathe, MD  REFERRING PROVIDER: same  REFERRING DIAG: lymphedema I89.0  THERAPY DIAG:  Lymphedema, not elsewhere classified  Rationale for Evaluation and Treatment: Rehabilitation  ONSET DATE: Pt reports onset of L ankle / leg swelling after injury 20-30 yrs ago. Then about 1 year ago legs started swelling and Pt having prickly sensation on shins and thighs bilaterally.   SUBJECTIVE:  SUBJECTIVE STATEMENT: Judith Arnold presents to OT for treatment of BLE lymphedema 2/2 suspected CVI and obesity. Pt is unaccompanied today. Pt denies LE related pain. She brings clean wraps to clinic rolled and ready to re apply. Pt reports she had a call back from DME vendor after last session. Pt continues to get help from a friend for wrapping between sessions.  (INITIAL EVAL 07/12/23 : Judith Arnold is referred  to Occupational Therapy by Fredy GORMAN Bathe, MD,  for evaluation and treatment of BLE lymphedema.  Pt reports onset of L ankle and distal leg swelling about 30 years ago, then noticed RLE swelling and worsening LLE swelling about 1 year ago at same time she started having unusual sensation in legs. Pt denies previously having lymphedema treatment. Pt reports paternal aunt had leg swelling. Pt is unable to wear compression stockings because she is unable to reach feet and distal legs to don and doff them due to back and hip pain.  PERTINENT HISTORY: Relevant to lymphedema (OSA (denies using CPAP), BLE stasis dermatitis, HTN, DM 2 (Pt states she is prediabetic only), B glaucoma, Back and R hip OA  PAIN:  Are you having LE-related pain? Yes: NPRS scale: 0/10 Pain location: medial proximal thighs w abduction Pain description: like stickers Aggravating factors: standing, walking, dependent sitting Relieving factors: elevation  PRECAUTIONS: Fall and Other: LYMPHEDEMA precautions  (DM 2 and asthma)  WEIGHT BEARING RESTRICTIONS: No  FALLS:  Has patient fallen in last 6 months? yes Fell asleep at the computer and fell onto the floor [place] when storm door hit me  LIVING ENVIRONMENT: Lives with: alone Lives in: House/apartment Stairs: No;  Has following equipment at home: Environmental consultant - 4 wheeled  OCCUPATION: retired Scientist, research (life sciences)  LEISURE: reading and taking notes on current events and politics  HAND DOMINANCE: right   PRIOR LEVEL OF FUNCTION: Independent with household mobility with device, Requires assistive device for independence, Needs assistance with ADLs, Needs assistance with homemaking, and Needs assistance with gait  PATIENT GOALS: reduce swelling and keep it from getting worse  OBJECTIVE: Note: Objective measures were completed at Evaluation unless otherwise noted.  COGNITION:  Overall cognitive status: impaired short term  memory   OBSERVATIONS / OTHER ASSESSMENTS: Mild,  Stage II, BLE Lymphedema 2/2 suspected venous insufficiency.   SENSATION: Reports uncomfortable scratching, or sticker-like  sensation on anterior thighs   POSTURE: Raised walker handles 2 notches to increase upright spinal alignment when walking to limit falls risk. Handles need to be raised at least 1 hole more for safe upright posture when walking.  Upright sitting posture: head forward , shoulders forward and rounded- appears to be flexible kyphosis  LE ROM: WFL for ankles and knees. Pt reports limited hip abduction )  LE MMT: WFL FOR LYMPHEDEMA CARE. Presenting with generalized weakness and Pt reports debility  LYMPHEDEMA ASSESSMENTS:   SURGERY TYPE/DATE: Non-cancer related limb swelling  HX INFECTIONS: positive for 1 episode cellulitis treated w antibiotics  Hx WOUNDS: denies   BLE COMPARATIVE LIMB VOLUMETRICS: INITIAL 07/19/23  LANDMARK RIGHT (dominant)  R LEG (A-D) 3525.1 ml  R THIGH (E-G) ml  R FULL LIMB (A-G) ml  Limb Volume differential (LVD)  %  Volume change since initial %  Volume change overall V  (Blank rows = not tested)  LANDMARK LEFT    L LEG (A-D) 3769.6 ml  L THIGH (E-G) ml  L FULL LIMB (A-G) ml  Limb Volume differential (LVD)  Limb Volume Differential (LVD) measures  6.5%, L>R.  Volume change since initial %  Volume change overall %    RLE COMPARATIVE LIMB VOLUMETRICS: VISIT 9 08/15/23  LANDMARK RIGHT (dominant)  R LEG (A-D) 3549.4 ml  R THIGH (E-G) ml  R FULL LIMB (A-G) ml  Limb Volume differential (LVD)  %  Volume change since initial R LEG volume reduced by 5.84% since initially measured on 07/18/21  Volume change overall V  (Blank rows = not tested)  RLE COMPARATIVE LIMB VOLUMETRICS: VISIT 19 10/03/23  LANDMARK RIGHT (dominant)  R LEG (A-D) 3549.4 ml  R THIGH (E-G) ml  R FULL LIMB (A-G) ml  Limb Volume differential (LVD)  %  Volume change since initial R LEG volume reduced by 14.7% since initially measured on 07/18/21  Volume change  overall V  (Blank rows = not tested)  Mild, Stage  II, Bilateral Lower Extremity Lymphedema 2/2 CVI and Obesity  Skin  Description Hyper-Keratosis Peau' de Orange Shiny Tight Fibrotic/ Indurated Fatty Doughy Spongy/ boggy       R>L x  x   Skin dry Flaky WNL Macerated   mildly      Color Redness Varicosities Blanching Hemosiderin Stain Mottled   x     x   Odor Malodorous Yeast Fungal infection  WNL      x   Temperature Warm Cool wnl    x     Pitting Edema   1+ 2+ 3+ 4+ Non-pitting         x   Girth Symmetrical Asymmetrical                   Distribution    R>L toes to groin    Stemmer Sign Positive Negative   +    Lymphorrhea History Of:  Present Absent     x    Wounds History Of Present Absent Venous Arterial Pressure Sheer     x        Signs of Infection Redness Warmth Erythema Acute Swelling Drainage Borders                    Sensation Light Touch Deep pressure Hypersensitivity   Present Impaired Present Impaired Absent Impaired   x Tactile  x  x     Nails WNL   Fungus nail dystrophy   x     Hair Growth Symmetrical Asymmetrical   x    Skin Creases Base of toes  Ankles   Base of Fingers knees       Abdominal pannus Thigh Lobules  Face/neck   x x  x      (Blank rows = not tested)    GAIT: Distance walked: >500' Assistive device utilized: Environmental consultant - 4 wheeled Level of assistance: Modified independence Comments: Pt bent at waist nearly 90 degrees with walker handles in lowest position  LYMPHEDEMA LIFE IMPACT SCALE (LLIS): TBA OT Rx visit 1  TREATMENT DATE:  RLE/RLQ MLD and simultaneous skin care RLE multilayer compression bandaging Pt edu for lymphedema progress towards goals and self-care    PATIENT EDUCATION:  Continued Pt/ CG edu for lymphedema self care home program throughout session. Topics include outcome of comparative limb volumetrics- starting limb volume differentials (LVDs), technology and gradient techniques used for  short stretch, multilayer compression wrapping, simple self-MLD, therapeutic lymphatic pumping exercises, skin/nail care, LE precautions, compression garment recommendations and specifications, wear and care schedule and compression garment donning / doffing w assistive devices. Discussed progress towards all OT goals  since commencing CDT. Discussed detrimental impact of obesity on lower and upper extremity lymphedema over time. Reviewed OT goals for lymphedema care with Pt and discussed progress to date.  All questions answered to the Pt's satisfaction. Good return. Person educated: Patient  Education method: Explanation, Demonstration, and Handouts Education comprehension: verbalized understanding, returned demonstration, verbal cues required, and needs further education   HOME EXERCISE PROGRAM: BLE lymphatic pumping there ex using- 1 sets of 10 reps, each exercise in order-  1-2 x daily, bilaterally Simple self MLD 1 x daily Daily skin care to increase hydration, skin mobility and decrease infection risk- can be done during MLD During Intensive Phase CDT: Compression wraps 23/7 until limb volume reduction complete During Self-management Phase CDT: Fit with appropriate compression garments or alternatives. Consider BLE, knee length, Mediven, custom, CircAid , Velcro style leggings over soft cotton liners.  ASSESSMENT: CLINICAL IMPRESSION:  Pt education provided throughout session re insurance coverage for lymphedema garments and devices., Pt verbalizes understanding of the recommendations, preauthorisation and insurance coverage , and measuring and fitting processes by end of session. Continued MLD with simultaneous skin care to RLE/RLQ today without pain. Re applied compression wraps to RLE as established without pain or numbness. Garment alternatives are on order. Awaiting DME vendor to contact Pt so order and fitting can proceed. Cont as per POC.  (INITIAL EVAL 07/12/23: Judith Arnold  is a 72 yo female presenting with chronic, progressive, BLE swelling 2/2 unknown etiology, which at first glance appears to be circulatory in nature due to skin coloring at distal legs where swelling is most invested. Pt is presently unable to wear traditional elastic compression garments due to difficulty donning and doffing them.  Chronic, progressive lymphedema with associated skin changes, including fibrosis, limits this patent's functional performance in all occupational domains, including functional ambulation and mobility, basic and instrumental ADLs (lower body dressing, LB bathing, fitting street shoes and LB clothing, driving, shopping, and home management. It also limits perform productive activities, leisure pursuits, and participation in social and community activities. BLE lymphedema contributes to elevated infection risk. Pt will benefit from skilled OT for Complete Decongestive Therapy (CDT), which  typically includes manual lymphatic drainage (MLD), skin care to limit' infection risk and increase skin excursion, lymphatic pumping exercise, and during the Intensive Phase multilayer, gradient compression bandaging to reduce limb volume. Once limb volume is reduced as much as possible, Pt is fitted with appropriate compression garments and/ or devices and transitions into the self management phase of care consisting of follow long support PRN.    Pt understands that her fair prognosis will become poor without daily assistance with compression wrapping since she is unable to reach feet and legs to apply them herself. Pt assures OT that she has a friend she believes is willing to assist her between OT sessions. )   OBJECTIVE IMPAIRMENTS: decreased standing and walking tolerance, activity tolerance, decreased balance, decreased knowledge of condition, decreased knowledge of use of DME, decreased mobility, decreased ROM, decreased strength, increased edema, impaired flexibility, impaired sensation,  impaired UE functional use, impaired vision/reception, pain, and chronic, progressive, BLE swelling and associated skin changes, at increased infection risk   ACTIVITY LIMITATIONS: Functional ambulation and mobility (lifting, carrying, steps/stairs, transfers, squatting) , Basic and instrumental ADLs, leisure pursuits, productive activities, social participation  PARTICIPATION LIMITATIONS: meal prep, cleaning, laundry, driving, shopping, yard work, and    PERSONAL FACTORS: Age, Fitness, Past/current experiences, Time since onset of injury/illness/exacerbation, 2+ co morbidities: OSA, HTN,  are also  affecting patient's functional outcome.   REHAB POTENTIAL: Good  EVALUATION COMPLEXITY: Moderate   GOALS: Goals reviewed with patient? Yes  SHORT TERM GOALS: Target date: 4th OT Rx visit   Pt will demonstrate understanding of lymphedema precautions and prevention strategies with modified independence using a printed reference to identify at least 5 precautions and discussing how s/he may implement them into daily life to reduce risk of progression with extra time. Baseline:Max A Goal status: GOAL MET  2.  Pt will be able to apply multilayer, knee length, gradient, compression wraps to one leg at a time from toes to below knee with max caregiver assist to decrease limb volume, to limit infection risk, and to limit lymphedema progression.  Baseline: Dependent Goal status: GOAL MET  LONG TERM GOALS: Target date: 09/19/23  1.Given this patient's Intake score of TBA % on the Lymphedema Life Impact Scale (LLIS), patient will experience a reduction of at least 5 points in her perceived level of functional impairment resulting from lymphedema to improve functional performance and quality of life (QOL). Baseline: TBA % Goal status:  GOAL DEFERRED  2.  Pt will achieve at least a 10% volume reduction in B legs to return limb to typical size and shape, to limit infection risk and LE progression, to  decrease pain, to improve function. Baseline: Dependent Goal status: PROGRESSING. R LEG volume reduced by 14.7% since initially measured on 07/18/21  3.  Pt will obtain appropriate compression garments/devices and achieve modified independence (extra time + assistive devices) with donning/doffing to optimize limb volume reductions and limit LE progression over time. Baseline: Dependent Goal status: PROGRESSING. RLE garment on order  During Intensive phase CDT, with max CG assistance, Pt will achieve at least 85% compliance with all adapted lymphedema self-care home program components, including daily skin care, compression wraps and /or garments, simple self MLD and lymphatic pumping therex to habituate LE self care protocol  into ADLs for optimal LE self-management over time. Baseline: Dependent Goal status: GOAL MET  PLAN:  OT FREQUENCY: 2 x/week  OT DURATION: 12 weeks  PLANNED INTERVENTIONS:compression bandaging, skin care,  97110-Therapeutic exercises, 97530- Therapeutic activity, 97535- Self Care, Manual lymph drainage, DME instructions, and fit with appropriate compression  PLAN FOR NEXT SESSION:  Measure R leg to assess progress towards a stocking, or some alternative Cont Pt edu for LE self-care   Zebedee Dec, MS, OTR/L, CLT-LANA 10/30/23 10:32 AM

## 2023-11-01 ENCOUNTER — Ambulatory Visit

## 2023-11-01 ENCOUNTER — Ambulatory Visit: Admitting: Occupational Therapy

## 2023-11-01 ENCOUNTER — Encounter: Payer: Self-pay | Admitting: Occupational Therapy

## 2023-11-01 DIAGNOSIS — M5459 Other low back pain: Secondary | ICD-10-CM | POA: Diagnosis not present

## 2023-11-01 DIAGNOSIS — R278 Other lack of coordination: Secondary | ICD-10-CM

## 2023-11-01 DIAGNOSIS — R262 Difficulty in walking, not elsewhere classified: Secondary | ICD-10-CM

## 2023-11-01 DIAGNOSIS — I890 Lymphedema, not elsewhere classified: Secondary | ICD-10-CM | POA: Diagnosis not present

## 2023-11-01 DIAGNOSIS — M25551 Pain in right hip: Secondary | ICD-10-CM

## 2023-11-01 DIAGNOSIS — M6281 Muscle weakness (generalized): Secondary | ICD-10-CM | POA: Diagnosis not present

## 2023-11-01 NOTE — Therapy (Signed)
 OUTPATIENT OCCUPATIONAL THERAPY TREATMENT NOTE  BILATERAL LOWER EXTREMITY LYMPHEDEMA  Patient Name: Judith Arnold MRN: [id number] DOB:[birth year], 73 y.o., female Today's Date: 11/01/2023  REPORTING PERIOD:   END OF SESSION:   OT End of Session - 11/01/23 1313     Visit Number 27    Number of Visits 36    Date for OT Re-Evaluation 01/16/24    OT Start Time 0100    OT Stop Time 0205    OT Time Calculation (min) 65 min    Activity Tolerance Patient tolerated treatment well;No increased pain           Past Medical History:  Diagnosis Date   Allergy    Arthritis    lower back, left shoulder   Asthma    Carpal tunnel syndrome on both sides    Dental bridge present    top   Dental crown present    multiple   Family history of adverse reaction to anesthesia    Father and sister - PONV   GERD (gastroesophageal reflux disease)    Glaucoma    Hypertension    Leaky heart valve    Pre-diabetes    Sleep apnea    Past Surgical History:  Procedure Laterality Date   CATARACT EXTRACTION W/PHACO Right 11/16/2016   Procedure: CATARACT EXTRACTION PHACO AND INTRAOCULAR LENS PLACEMENT (IOC)  right;  Surgeon: Mittie Gaskin, MD;  Location: [facility];  Service: Ophthalmology;  Laterality: Right;  pre diabetic latex sensitivity sleep apnea   CATARACT EXTRACTION W/PHACO Left 01/18/2017   Procedure: CATARACT EXTRACTION PHACO AND INTRAOCULAR LENS PLACEMENT (IOC) LEFT DIABETIC;  Surgeon: Mittie Gaskin, MD;  Location: [facility];  Service: Ophthalmology;  Laterality: Left;   COLONOSCOPY     COLONOSCOPY WITH PROPOFOL  N/A 04/09/2018   Procedure: COLONOSCOPY WITH PROPOFOL ;  Surgeon: Viktoria Lamar DASEN, MD;  Location: [facility];  Service: Endoscopy;  Laterality: N/A;   IRIDOTOMY / IRIDECTOMY Bilateral 2005   TOOTH EXTRACTION     Patient Active Problem List   Diagnosis Date Noted   Chronic bilateral low back pain with right-sided sciatica 09/29/2023    Hypertension associated with diabetes (HCC) 01/20/2023   Combined hyperlipidemia associated with type 2 diabetes mellitus (HCC) 01/20/2023   Intertrigo 11/01/2022   Stasis dermatitis of both legs 10/21/2022   Leg swelling 10/21/2022   Lymphedema of lower extremity 10/21/2022   Type 2 diabetes mellitus without complication, without long-term current use of insulin (HCC) 05/24/2022   Mixed hyperlipidemia 05/24/2022   Essential hypertension, benign 05/24/2022   Absolute anemia 05/24/2022   Gastroesophageal reflux disease without esophagitis 05/24/2022   Allergic rhinitis 05/24/2022   Asthma, allergic, moderate persistent, uncomplicated 05/24/2022   Bilateral chronic angle-closure glaucoma, indeterminate stage 05/24/2022   Airway hyperreactivity 08/29/2013   OSA on CPAP 08/29/2013    PCP: Fredy GORMAN Bathe, MD  REFERRING PROVIDER: same  REFERRING DIAG: lymphedema I89.0  THERAPY DIAG:  Lymphedema, not elsewhere classified  Rationale for Evaluation and Treatment: Rehabilitation  ONSET DATE: Pt reports onset of L ankle / leg swelling after injury 20-30 yrs ago. Then about 1 year ago legs started swelling and Pt having prickly sensation on shins and thighs bilaterally.   SUBJECTIVE:  SUBJECTIVE STATEMENT: Judith Arnold presents to OT for treatment of BLE lymphedema 2/2 suspected CVI and obesity. Pt is unaccompanied today. Pt denies LE related pain. She brings clean wraps to clinic rolled and ready to re apply. Pt continues to get help from a friend for wrapping between sessions.  (INITIAL EVAL 07/12/23 : Judith Arnold is referred to Occupational Therapy by Fredy GORMAN Bathe, MD,  for evaluation and treatment of BLE lymphedema.  Pt reports onset of L ankle and distal leg swelling about 30 years ago, then  noticed RLE swelling and worsening LLE swelling about 1 year ago at same time she started having unusual sensation in legs. Pt denies previously having lymphedema treatment. Pt reports paternal aunt had leg swelling. Pt is unable to wear compression stockings because she is unable to reach feet and distal legs to don and doff them due to back and hip pain.  PERTINENT HISTORY: Relevant to lymphedema (OSA (denies using CPAP), BLE stasis dermatitis, HTN, DM 2 (Pt states she is prediabetic only), B glaucoma, Back and R hip OA  PAIN:  Are you having LE-related pain? Yes: NPRS scale: 0/10 Pain location: medial proximal thighs w abduction Pain description: like stickers Aggravating factors: standing, walking, dependent sitting Relieving factors: elevation  PRECAUTIONS: Fall and Other: LYMPHEDEMA precautions  (DM 2 and asthma)  WEIGHT BEARING RESTRICTIONS: No  FALLS:  Has patient fallen in last 6 months? yes Fell asleep at the computer and fell onto the floor [place] when storm door hit me  LIVING ENVIRONMENT: Lives with: alone Lives in: House/apartment Stairs: No;  Has following equipment at home: Environmental consultant - 4 wheeled  OCCUPATION: retired Scientist, research (life sciences)  LEISURE: reading and taking notes on current events and politics  HAND DOMINANCE: right   PRIOR LEVEL OF FUNCTION: Independent with household mobility with device, Requires assistive device for independence, Needs assistance with ADLs, Needs assistance with homemaking, and Needs assistance with gait  PATIENT GOALS: reduce swelling and keep it from getting worse  OBJECTIVE: Note: Objective measures were completed at Evaluation unless otherwise noted.  COGNITION:  Overall cognitive status: impaired short term  memory   OBSERVATIONS / OTHER ASSESSMENTS: Mild, Stage II, BLE Lymphedema 2/2 suspected venous insufficiency.   SENSATION: Reports uncomfortable scratching, or sticker-like  sensation on anterior thighs   POSTURE:  Raised walker handles 2 notches to increase upright spinal alignment when walking to limit falls risk. Handles need to be raised at least 1 hole more for safe upright posture when walking.  Upright sitting posture: head forward , shoulders forward and rounded- appears to be flexible kyphosis  LE ROM: WFL for ankles and knees. Pt reports limited hip abduction )  LE MMT: WFL FOR LYMPHEDEMA CARE. Presenting with generalized weakness and Pt reports debility  LYMPHEDEMA ASSESSMENTS:   SURGERY TYPE/DATE: Non-cancer related limb swelling  HX INFECTIONS: positive for 1 episode cellulitis treated w antibiotics  Hx WOUNDS: denies   BLE COMPARATIVE LIMB VOLUMETRICS: INITIAL 07/19/23  LANDMARK RIGHT (dominant)  R LEG (A-D) 3525.1 ml  R THIGH (E-G) ml  R FULL LIMB (A-G) ml  Limb Volume differential (LVD)  %  Volume change since initial %  Volume change overall V  (Blank rows = not tested)  LANDMARK LEFT    L LEG (A-D) 3769.6 ml  L THIGH (E-G) ml  L FULL LIMB (A-G) ml  Limb Volume differential (LVD)  Limb Volume Differential (LVD) measures 6.5%, L>R.  Volume change since initial %  Volume change overall %  RLE COMPARATIVE LIMB VOLUMETRICS: VISIT 9 08/15/23  LANDMARK RIGHT (dominant)  R LEG (A-D) 3549.4 ml  R THIGH (E-G) ml  R FULL LIMB (A-G) ml  Limb Volume differential (LVD)  %  Volume change since initial R LEG volume reduced by 5.84% since initially measured on 07/18/21  Volume change overall V  (Blank rows = not tested)  RLE COMPARATIVE LIMB VOLUMETRICS: VISIT 19 10/03/23  LANDMARK RIGHT (dominant)  R LEG (A-D) 3549.4 ml  R THIGH (E-G) ml  R FULL LIMB (A-G) ml  Limb Volume differential (LVD)  %  Volume change since initial R LEG volume reduced by 14.7% since initially measured on 07/18/21  Volume change overall V  (Blank rows = not tested)  Mild, Stage  II, Bilateral Lower Extremity Lymphedema 2/2 CVI and Obesity  Skin  Description Hyper-Keratosis Peau' de Orange Shiny  Tight Fibrotic/ Indurated Fatty Doughy Spongy/ boggy       R>L x  x   Skin dry Flaky WNL Macerated   mildly      Color Redness Varicosities Blanching Hemosiderin Stain Mottled   x     x   Odor Malodorous Yeast Fungal infection  WNL      x   Temperature Warm Cool wnl    x     Pitting Edema   1+ 2+ 3+ 4+ Non-pitting         x   Girth Symmetrical Asymmetrical                   Distribution    R>L toes to groin    Stemmer Sign Positive Negative   +    Lymphorrhea History Of:  Present Absent     x    Wounds History Of Present Absent Venous Arterial Pressure Sheer     x        Signs of Infection Redness Warmth Erythema Acute Swelling Drainage Borders                    Sensation Light Touch Deep pressure Hypersensitivity   Present Impaired Present Impaired Absent Impaired   x Tactile  x  x     Nails WNL   Fungus nail dystrophy   x     Hair Growth Symmetrical Asymmetrical   x    Skin Creases Base of toes  Ankles   Base of Fingers knees       Abdominal pannus Thigh Lobules  Face/neck   x x  x      (Blank rows = not tested)    GAIT: Distance walked: >500' Assistive device utilized: Environmental consultant - 4 wheeled Level of assistance: Modified independence Comments: Pt bent at waist nearly 90 degrees with walker handles in lowest position  LYMPHEDEMA LIFE IMPACT SCALE (LLIS): TBA OT Rx visit 1  TREATMENT DATE:  RLE/RLQ MLD and simultaneous skin care RLE multilayer compression bandaging Pt edu for lymphedema progress towards goals and self-care    PATIENT EDUCATION:  Continued Pt/ CG edu for lymphedema self care home program throughout session. Topics include outcome of comparative limb volumetrics- starting limb volume differentials (LVDs), technology and gradient techniques used for short stretch, multilayer compression wrapping, simple self-MLD, therapeutic lymphatic pumping exercises, skin/nail care, LE precautions, compression garment recommendations and  specifications, wear and care schedule and compression garment donning / doffing w assistive devices. Discussed progress towards all OT goals since commencing CDT. Discussed detrimental impact of obesity on lower and upper extremity lymphedema over time.  Reviewed OT goals for lymphedema care with Pt and discussed progress to date.  All questions answered to the Pt's satisfaction. Good return. Person educated: Patient  Education method: Explanation, Demonstration, and Handouts Education comprehension: verbalized understanding, returned demonstration, verbal cues required, and needs further education   HOME EXERCISE PROGRAM: BLE lymphatic pumping there ex using- 1 sets of 10 reps, each exercise in order-  1-2 x daily, bilaterally Simple self MLD 1 x daily Daily skin care to increase hydration, skin mobility and decrease infection risk- can be done during MLD During Intensive Phase CDT: Compression wraps 23/7 until limb volume reduction complete During Self-management Phase CDT: Fit with appropriate compression garments or alternatives. Consider BLE, knee length, Mediven, custom, CircAid , Velcro style leggings over soft cotton liners.  ASSESSMENT: CLINICAL IMPRESSION: Continued MLD with simultaneous skin care to RLE/RLQ today without pain. Re applied compression wraps to RLE as established without pain or numbness. Garment alternatives are on order. Awaiting DME delivery from vendor so RLE fitting can proceed. Cont as per POC.  (INITIAL EVAL 07/12/23: Judith Arnold is a 73 yo female presenting with chronic, progressive, BLE swelling 2/2 unknown etiology, which at first glance appears to be circulatory in nature due to skin coloring at distal legs where swelling is most invested. Pt is presently unable to wear traditional elastic compression garments due to difficulty donning and doffing them.  Chronic, progressive lymphedema with associated skin changes, including fibrosis, limits this  patent's functional performance in all occupational domains, including functional ambulation and mobility, basic and instrumental ADLs (lower body dressing, LB bathing, fitting street shoes and LB clothing, driving, shopping, and home management. It also limits perform productive activities, leisure pursuits, and participation in social and community activities. BLE lymphedema contributes to elevated infection risk. Pt will benefit from skilled OT for Complete Decongestive Therapy (CDT), which  typically includes manual lymphatic drainage (MLD), skin care to limit' infection risk and increase skin excursion, lymphatic pumping exercise, and during the Intensive Phase multilayer, gradient compression bandaging to reduce limb volume. Once limb volume is reduced as much as possible, Pt is fitted with appropriate compression garments and/ or devices and transitions into the self management phase of care consisting of follow long support PRN.    Pt understands that her fair prognosis will become poor without daily assistance with compression wrapping since she is unable to reach feet and legs to apply them herself. Pt assures OT that she has a friend she believes is willing to assist her between OT sessions. )   OBJECTIVE IMPAIRMENTS: decreased standing and walking tolerance, activity tolerance, decreased balance, decreased knowledge of condition, decreased knowledge of use of DME, decreased mobility, decreased ROM, decreased strength, increased edema, impaired flexibility, impaired sensation, impaired UE functional use, impaired vision/reception, pain, and chronic, progressive, BLE swelling and associated skin changes, at increased infection risk   ACTIVITY LIMITATIONS: Functional ambulation and mobility (lifting, carrying, steps/stairs, transfers, squatting) , Basic and instrumental ADLs, leisure pursuits, productive activities, social participation  PARTICIPATION LIMITATIONS: meal prep, cleaning, laundry,  driving, shopping, yard work, and    PERSONAL FACTORS: Age, Fitness, Past/current experiences, Time since onset of injury/illness/exacerbation, 2+ co morbidities: OSA, HTN,  are also affecting patient's functional outcome.   REHAB POTENTIAL: Good  EVALUATION COMPLEXITY: Moderate   GOALS: Goals reviewed with patient? Yes  SHORT TERM GOALS: Target date: 4th OT Rx visit   Pt will demonstrate understanding of lymphedema precautions and prevention strategies with modified independence using a printed reference to identify  at least 5 precautions and discussing how s/he may implement them into daily life to reduce risk of progression with extra time. Baseline:Max A Goal status: GOAL MET  2.  Pt will be able to apply multilayer, knee length, gradient, compression wraps to one leg at a time from toes to below knee with max caregiver assist to decrease limb volume, to limit infection risk, and to limit lymphedema progression.  Baseline: Dependent Goal status: GOAL MET  LONG TERM GOALS: Target date: 09/19/23  1.Given this patient's Intake score of TBA % on the Lymphedema Life Impact Scale (LLIS), patient will experience a reduction of at least 5 points in her perceived level of functional impairment resulting from lymphedema to improve functional performance and quality of life (QOL). Baseline: TBA % Goal status:  GOAL DEFERRED  2.  Pt will achieve at least a 10% volume reduction in B legs to return limb to typical size and shape, to limit infection risk and LE progression, to decrease pain, to improve function. Baseline: Dependent Goal status: PROGRESSING. R LEG volume reduced by 14.7% since initially measured on 07/18/21  3.  Pt will obtain appropriate compression garments/devices and achieve modified independence (extra time + assistive devices) with donning/doffing to optimize limb volume reductions and limit LE progression over time. Baseline: Dependent Goal status: PROGRESSING. RLE garment  on order  During Intensive phase CDT, with max CG assistance, Pt will achieve at least 85% compliance with all adapted lymphedema self-care home program components, including daily skin care, compression wraps and /or garments, simple self MLD and lymphatic pumping therex to habituate LE self care protocol  into ADLs for optimal LE self-management over time. Baseline: Dependent Goal status: GOAL MET  PLAN:  OT FREQUENCY: 2 x/week  OT DURATION: 12 weeks  PLANNED INTERVENTIONS:compression bandaging, skin care,  97110-Therapeutic exercises, 97530- Therapeutic activity, 97535- Self Care, Manual lymph drainage, DME instructions, and fit with appropriate compression  PLAN FOR NEXT SESSION:  Measure R leg to assess progress towards a stocking, or some alternative Cont Pt edu for LE self-care   Zebedee Dec, MS, OTR/L, CLT-LANA 11/01/23 2:05 PM

## 2023-11-01 NOTE — Therapy (Signed)
 OUTPATIENT PHYSICAL THERAPY THORACOLUMBAR TREATMENT   Patient Name: Judith Arnold MRN: [id number] DOB:[birth year], 73 y.o., female Today's Date: 11/01/2023  END OF SESSION:  PT End of Session - 11/01/23 1256     Visit Number 9    Number of Visits 25    Date for PT Re-Evaluation 12/12/23    Progress Note Due on Visit 10    PT Start Time 1403    PT Stop Time 1444    PT Time Calculation (min) 41 min    Equipment Utilized During Treatment Gait belt    Activity Tolerance Patient tolerated treatment well;Patient limited by pain    Behavior During Therapy WFL for tasks assessed/performed               Past Medical History:  Diagnosis Date   Allergy    Arthritis    lower back, left shoulder   Asthma    Carpal tunnel syndrome on both sides    Dental bridge present    top   Dental crown present    multiple   Family history of adverse reaction to anesthesia    Father and sister - PONV   GERD (gastroesophageal reflux disease)    Glaucoma    Hypertension    Leaky heart valve    Pre-diabetes    Sleep apnea    Past Surgical History:  Procedure Laterality Date   CATARACT EXTRACTION W/PHACO Right 11/16/2016   Procedure: CATARACT EXTRACTION PHACO AND INTRAOCULAR LENS PLACEMENT (IOC)  right;  Surgeon: Mittie Gaskin, MD;  Location: [facility];  Service: Ophthalmology;  Laterality: Right;  pre diabetic latex sensitivity sleep apnea   CATARACT EXTRACTION W/PHACO Left 01/18/2017   Procedure: CATARACT EXTRACTION PHACO AND INTRAOCULAR LENS PLACEMENT (IOC) LEFT DIABETIC;  Surgeon: Mittie Gaskin, MD;  Location: [facility];  Service: Ophthalmology;  Laterality: Left;   COLONOSCOPY     COLONOSCOPY WITH PROPOFOL  N/A 04/09/2018   Procedure: COLONOSCOPY WITH PROPOFOL ;  Surgeon: Viktoria Lamar DASEN, MD;  Location: [facility];  Service: Endoscopy;  Laterality: N/A;   IRIDOTOMY / IRIDECTOMY Bilateral 2005   TOOTH EXTRACTION     Patient Active Problem List    Diagnosis Date Noted   Chronic bilateral low back pain with right-sided sciatica 09/29/2023   Hypertension associated with diabetes (HCC) 01/20/2023   Combined hyperlipidemia associated with type 2 diabetes mellitus (HCC) 01/20/2023   Intertrigo 11/01/2022   Stasis dermatitis of both legs 10/21/2022   Leg swelling 10/21/2022   Lymphedema of lower extremity 10/21/2022   Type 2 diabetes mellitus without complication, without long-term current use of insulin (HCC) 05/24/2022   Mixed hyperlipidemia 05/24/2022   Essential hypertension, benign 05/24/2022   Absolute anemia 05/24/2022   Gastroesophageal reflux disease without esophagitis 05/24/2022   Allergic rhinitis 05/24/2022   Asthma, allergic, moderate persistent, uncomplicated 05/24/2022   Bilateral chronic angle-closure glaucoma, indeterminate stage 05/24/2022   Airway hyperreactivity 08/29/2013   OSA on CPAP 08/29/2013    PCP: Fernand Fredy RAMAN, MD  REFERRING PROVIDER: Fernand Fredy, RAMAN, MD  REFERRING DIAG:  Diagnosis  G89.29,M54.41 (ICD-10-CM) - Chronic bilateral low back pain with right-sided sciatica    Rationale for Evaluation and Treatment: Rehabilitation  THERAPY DIAG:  Other low back pain  Pain in right hip  Muscle weakness (generalized)  Difficulty in walking, not elsewhere classified  Other lack of coordination  ONSET DATE: >3 years   SUBJECTIVE:  SUBJECTIVE STATEMENT:  Today:  Patient reports ongoing leg and back pain with inability to stand straight.  Patient reports 3/10 low back pain.    From EVAL: Pt is a pleasant 73 y/o female presenting to PT eval for chronic LBP with R side hip pain. She reports onset of LBP over 3 years ago. Primary site of pain is mostly felt in R hip and lower spine. Pt reports hx of arthritis in  lower back and R hip. She is now starting to feel arthritis pain in her knees as well. Reports RLE is actually her good leg, has some issues with pain into groin region of LLE. Pt states strength in legs has been impacted and that her general mobility is worse. Pt can only ambulate comfortably by taking weight off of lower back by using her 4WW. She states not using 4WW so much for balance, but for reducing pain with gait. She is unable to stand up fully due to pain. Pt says she used to be very fit (took take karate, gymnastics years ago). She is also seeing OT for BLE lymphedema management. She endorses difficulty getting out of chairs, difficulty with steps, she must use a ramp to get in and out of her home. Pt reports my brain does not retain what I'm talking about, can lose train of thought.  PERTINENT HISTORY:  PMH per chart includes arthritis low back and left shoulder, bilateral carpal tunnel, asthma, glaucoma, HTN, leaky heart valve, sleep apnea, pre-diabetes, being seen by lymphedema specialist for BLE lymphedema   PAIN:  Are you having pain? LBP and R hip pain  Lowest pain level: 0/10  With standing: 2/10  Worst: 5-6/10   PRECAUTIONS: LATEX ALLERGY per chart, fall  RED FLAGS: None   WEIGHT BEARING RESTRICTIONS: No  FALLS:  Has patient fallen in last 6 months? No  LIVING ENVIRONMENT: Uses ramp to get into home due to difficulty with steps, has 4WW  PLOF: Independent  PATIENT GOALS:  Says due to back pain she is unable to reach my feet, has difficulty getting her socks on without a grabber, wants to be more flexible and stronger to increase ease with these activities and mobility    OBJECTIVE:  Note: Objective measures were completed at Evaluation unless otherwise noted.  DIAGNOSTIC FINDINGS:  No recent pertinent imaging available in chart  PATIENT SURVEYS:  Modified Oswestry score: 34%  Interpretation of scores: Score Category Description  0-20% Minimal  Disability The patient can cope with most living activities. Usually no treatment is indicated apart from advice on lifting, sitting and exercise  21-40% Moderate Disability The patient experiences more pain and difficulty with sitting, lifting and standing. Travel and social life are more difficult and they may be disabled from work. Personal care, sexual activity and sleeping are not grossly affected, and the patient can usually be managed by conservative means  41-60% Severe Disability Pain remains the main problem in this group, but activities of daily living are affected. These patients require a detailed investigation  61-80% Crippled Back pain impinges on all aspects of the patient's life. Positive intervention is required  81-100% Bed-bound  These patients are either bed-bound or exaggerating their symptoms  Bluford FORBES Zoe DELENA Karon DELENA, et al. Surgery versus conservative management of stable thoracolumbar fracture: the PRESTO feasibility RCT. [place]): [employer]; 2021 Nov. [facility] [place] [employer] Assessment, No. 25.62.) Appendix 3, Oswestry Disability Index category descriptors. Available from: FindJewel[employer].cz  Minimally Clinically Important Difference (MCID) = 12.8%  COGNITION: Overall cognitive status: Within functional limits for tasks assessed, very pleasant pt can become tangential and requires redirection to activity    SENSATION: Intact to light touch BLE   MUSCLE LENGTH: deferred  POSTURE: increased thoracic kyphosis, rounded shoulders, unable to achieve full upright position due to pain, maintains increased hip and knee flexion when attempting to stand fully upright   PALPATION: deferred  Thoracolumbar AROM: Extension - unable to achieve neutral position, remains in significant flexion  Flexion - lacking at least 25%  Rotation - lacking at least 40% bilat  and pain-limited    LOWER EXTREMITY MMT:    MMT Right eval  Left eval  Hip flexion 4- 3*  Hip extension    Hip abduction 4+ 4+  Hip adduction 4+ 4+  Hip internal rotation    Hip external rotation    Knee flexion 4 3*  Knee extension 4+ 4+  Ankle dorsiflexion 4 4+  Ankle plantarflexion    Ankle inversion    Ankle eversion     (Blank rows = not tested) *=pain limited   LUMBAR SPECIAL TESTS:  deferred  FUNCTIONAL TESTS:  10 meter walk test: 0.63 m/s crouched posture, decreased hip extension, decreased step-length and heel strike, elevated R>L shoulder, heavy BUE weightbearing on 4WW  GAIT: Distance walked: 10MWT/clinic distances (see above) Assistive device utilized: Environmental consultant - 4 wheeled Level of assistance: Modified independence Comments: gait mechanics impaired: decreased gait speed, crouched posture, decreased hip extension, decreased step-length and heel strike, elevated R>L shoulder, heavy BUE weightbearing on 4WW  TREATMENT DATE: 11/01/2023   TA:  for coordination of activity using core +functional activity  -Standing step tap with abd bracing x 15 reps with LE (VC to look up)  - Seated abdominal bracing with hip march alt LE x 15 reps each.  -Standing lumbar flex into Lumbar ext x 15 in // bars  -Standing calf raises + abdominal bracing x 15 reps with 2 sec hold -Standing hip ext- alt LE with core stabilization x 7 reps (stopped due to wrist pain)  -Seated hip adduction squeeze with ball x 10  -Seated core activation with knee ext alt LE 3# 2 x 10 reps each -Standing hip abd +abd bracing x 10 reps each LE  -Resistive ambulation in // bars- fwd and bwd using 2.5# AW -Resistive ambulation using 4WW -3# AW x 170 feet focusing on increasing step length.                                                                                                                PATIENT EDUCATION:  Education details: Provided education on findings of exam, indications for plan, prognoses, goals, how PT can help                 Person educated:  Patient Education method: Explanation Education comprehension: verbalized understanding  HOME EXERCISE PROGRAM: Access Code: [email or web address] URL: [email or web address] Date: 10/05/2023 Prepared by: Lonni Gainer  Exercises - Supine Bridge  - 3 x weekly - 3 sets -  10 reps - Clamshell  - 3 x weekly - 3 sets - 10 reps - Supine Lower Trunk Rotation  - 1 x daily - 3 sets - 30 sec hold - Seated Hamstring Stretch  - 1 x daily - 3 sets - 30 hold - Seated Clamshell  - 1 x daily - 7 x weekly - 2 sets - 20 reps - 3 sec hold - Supine Transversus Abdominis Bracing - Hands on Stomach  - 3 x weekly - 3 sets - 10 reps - Supine Gluteal Sets  - 3 x weekly - 3 sets - 10 reps  ASSESSMENT:  CLINICAL IMPRESSION: Patient presents with good motivation overall- she was able to progress with more standing activities and able to add some resistance today without significant increase in pain. She remains limited with activities but able to modify and blend some seated and standing activities well today.  Pt will continue to benefit from skilled physical therapy intervention to address impairments, improve QOL, and attain therapy goals.    OBJECTIVE IMPAIRMENTS: Abnormal gait, decreased activity tolerance, decreased balance, decreased mobility, difficulty walking, decreased strength, hypomobility, increased edema, improper body mechanics, postural dysfunction, and pain.   ACTIVITY LIMITATIONS: carrying, lifting, bending, squatting, stairs, transfers, bed mobility, and locomotion level  PARTICIPATION LIMITATIONS: meal prep, cleaning, shopping, community activity, and yard work  PERSONAL FACTORS: Age, Fitness, Sex, Time since onset of injury/illness/exacerbation, and 3+ comorbidities: PMH per chart includes arthritis low back and left shoulder, bilateral carpal tunnel, asthma, glaucoma, HTN, leaky heart valve, sleep apnea, pre-diabetes, being seen by lymphedema specialist for BLE lymphedema  are also  affecting patient's functional outcome.   REHAB POTENTIAL: Good  CLINICAL DECISION MAKING: Evolving/moderate complexity  EVALUATION COMPLEXITY: Moderate   GOALS: Goals reviewed with patient? Yes   SHORT TERM GOALS: Target date: 10/31/2023    Patient will be independent in home exercise program to improve strength/mobility for better functional independence with ADLs. Baseline:to be initiated  Goal status: INITIAL   LONG TERM GOALS: Target date: 12/12/2023    Patient will reduce modified Oswestry score to <20 as to demonstrate minimal disability with ADLs including improved sleeping tolerance, walking/sitting tolerance etc for better mobility with ADLs.  Baseline: 34 Goal status: INITIAL  2.  Patient (> 37 years old) will complete five times sit to stand test in < 15 seconds indicating an increased LE strength and improved balance. Baseline: 09/26/2023= 26.46 sec with heavy UE Support Goal status: INITIAL  3.  Patient will increase Berg Balance score by > 6 points to demonstrate decreased fall risk during functional activities Baseline: 09/26/2023= 31/56 Goal status: INITIAL  4.  Patient will increase 10 meter walk test to >1.97m/s as to improve gait speed for better community ambulation and to reduce fall risk. Baseline:  0.63 m/s with 4WW Goal status: INITIAL      PLAN:  PT FREQUENCY: 1-2x/week  PT DURATION: 12 weeks  PLANNED INTERVENTIONS: [id number]- PT Re-evaluation, 97750- Physical Performance Testing, 97110-Therapeutic exercises, 97530- Therapeutic activity, [id number]- Neuromuscular re-education, 97535- Self Care, [id number]- Manual therapy, [id number]- Gait training, [phone]- Orthotic Initial, [phone]- Orthotic/Prosthetic subsequent, [phone]- Canalith repositioning, Patient/Family education, Balance training, Stair training, Taping, Joint mobilization, Spinal mobilization, Vestibular training, DME instructions, Cryotherapy, and Moist heat.  PLAN FOR NEXT SESSION:   Continue to  progress HEP Manual therapy for lumbar ROM/pain Therex for ROM, Low back/LE/Core strengthening Ensure log roll is being performed properly    Reyes LOISE London, PT 11/01/2023, 2:54 PM

## 2023-11-03 DIAGNOSIS — H18523 Epithelial (juvenile) corneal dystrophy, bilateral: Secondary | ICD-10-CM | POA: Diagnosis not present

## 2023-11-03 DIAGNOSIS — H401121 Primary open-angle glaucoma, left eye, mild stage: Secondary | ICD-10-CM | POA: Diagnosis not present

## 2023-11-03 DIAGNOSIS — H401112 Primary open-angle glaucoma, right eye, moderate stage: Secondary | ICD-10-CM | POA: Diagnosis not present

## 2023-11-03 DIAGNOSIS — Z961 Presence of intraocular lens: Secondary | ICD-10-CM | POA: Diagnosis not present

## 2023-11-05 NOTE — Therapy (Signed)
 OUTPATIENT PHYSICAL THERAPY THORACOLUMBAR TREATMENT/Physical Therapy Progress Note   Dates of reporting period  09/19/2023   to   11/06/2023   Patient Name: SACHA TOPOR MRN: [id number] DOB:1950/09/06, 73 y.o., female Today's Date: 11/07/2023  END OF SESSION:  PT End of Session - 11/06/23 1627     Visit Number 10    Number of Visits 25    Date for PT Re-Evaluation 12/12/23    Progress Note Due on Visit 10    PT Start Time 1620    PT Stop Time 1705    PT Time Calculation (min) 45 min    Equipment Utilized During Treatment Gait belt    Activity Tolerance Patient tolerated treatment well;Patient limited by pain    Behavior During Therapy WFL for tasks assessed/performed                Past Medical History:  Diagnosis Date   Allergy    Arthritis    lower back, left shoulder   Asthma    Carpal tunnel syndrome on both sides    Dental bridge present    top   Dental crown present    multiple   Family history of adverse reaction to anesthesia    Father and sister - PONV   GERD (gastroesophageal reflux disease)    Glaucoma    Hypertension    Leaky heart valve    Pre-diabetes    Sleep apnea    Past Surgical History:  Procedure Laterality Date   CATARACT EXTRACTION W/PHACO Right 11/16/2016   Procedure: CATARACT EXTRACTION PHACO AND INTRAOCULAR LENS PLACEMENT (IOC)  right;  Surgeon: Mittie Gaskin, MD;  Location: [facility];  Service: Ophthalmology;  Laterality: Right;  pre diabetic latex sensitivity sleep apnea   CATARACT EXTRACTION W/PHACO Left 01/18/2017   Procedure: CATARACT EXTRACTION PHACO AND INTRAOCULAR LENS PLACEMENT (IOC) LEFT DIABETIC;  Surgeon: Mittie Gaskin, MD;  Location: [facility];  Service: Ophthalmology;  Laterality: Left;   COLONOSCOPY     COLONOSCOPY WITH PROPOFOL  N/A 04/09/2018   Procedure: COLONOSCOPY WITH PROPOFOL ;  Surgeon: Viktoria Lamar DASEN, MD;  Location: [facility];  Service: Endoscopy;  Laterality: N/A;    IRIDOTOMY / IRIDECTOMY Bilateral 2005   TOOTH EXTRACTION     Patient Active Problem List   Diagnosis Date Noted   Chronic bilateral low back pain with right-sided sciatica 09/29/2023   Hypertension associated with diabetes (HCC) 01/20/2023   Combined hyperlipidemia associated with type 2 diabetes mellitus (HCC) 01/20/2023   Intertrigo 11/01/2022   Stasis dermatitis of both legs 10/21/2022   Leg swelling 10/21/2022   Lymphedema of lower extremity 10/21/2022   Type 2 diabetes mellitus without complication, without long-term current use of insulin (HCC) 05/24/2022   Mixed hyperlipidemia 05/24/2022   Essential hypertension, benign 05/24/2022   Absolute anemia 05/24/2022   Gastroesophageal reflux disease without esophagitis 05/24/2022   Allergic rhinitis 05/24/2022   Asthma, allergic, moderate persistent, uncomplicated 05/24/2022   Bilateral chronic angle-closure glaucoma, indeterminate stage 05/24/2022   Airway hyperreactivity 08/29/2013   OSA on CPAP 08/29/2013    PCP: Fernand Fredy RAMAN, MD  REFERRING PROVIDER: Fernand Fredy, RAMAN, MD  REFERRING DIAG:  Diagnosis  G89.29,M54.41 (ICD-10-CM) - Chronic bilateral low back pain with right-sided sciatica    Rationale for Evaluation and Treatment: Rehabilitation  THERAPY DIAG:  Other low back pain  Pain in right hip  Muscle weakness (generalized)  Difficulty in walking, not elsewhere classified  Other lack of coordination  ONSET DATE: >3 years  SUBJECTIVE:                                                                                                                                                                                           SUBJECTIVE STATEMENT:  Today:  Patient reports depressed because she received her new garment for her leg but it does not fit.   Patient reports 3/10 low back pain.  Left groin aroung 7/10  From EVAL: Pt is a pleasant 73 y/o female presenting to PT eval for chronic LBP with R side hip pain.  She reports onset of LBP over 3 years ago. Primary site of pain is mostly felt in R hip and lower spine. Pt reports hx of arthritis in lower back and R hip. She is now starting to feel arthritis pain in her knees as well. Reports RLE is actually her good leg, has some issues with pain into groin region of LLE. Pt states strength in legs has been impacted and that her general mobility is worse. Pt can only ambulate comfortably by taking weight off of lower back by using her 4WW. She states not using 4WW so much for balance, but for reducing pain with gait. She is unable to stand up fully due to pain. Pt says she used to be very fit (took take karate, gymnastics years ago). She is also seeing OT for BLE lymphedema management. She endorses difficulty getting out of chairs, difficulty with steps, she must use a ramp to get in and out of her home. Pt reports my brain does not retain what I'm talking about, can lose train of thought.  PERTINENT HISTORY:  PMH per chart includes arthritis low back and left shoulder, bilateral carpal tunnel, asthma, glaucoma, HTN, leaky heart valve, sleep apnea, pre-diabetes, being seen by lymphedema specialist for BLE lymphedema   PAIN:  Are you having pain? LBP and R hip pain  Lowest pain level: 0/10  With standing: 2/10  Worst: 5-6/10   PRECAUTIONS: LATEX ALLERGY per chart, fall  RED FLAGS: None   WEIGHT BEARING RESTRICTIONS: No  FALLS:  Has patient fallen in last 6 months? No  LIVING ENVIRONMENT: Uses ramp to get into home due to difficulty with steps, has 4WW  PLOF: Independent  PATIENT GOALS:  Says due to back pain she is unable to reach my feet, has difficulty getting her socks on without a grabber, wants to be more flexible and stronger to increase ease with these activities and mobility    OBJECTIVE:  Note: Objective measures were completed at Evaluation unless otherwise noted.  DIAGNOSTIC FINDINGS:  No recent pertinent imaging available in  chart  PATIENT SURVEYS:  Modified Oswestry score: 34%  Interpretation of scores: Score Category Description  0-20% Minimal Disability The patient can cope with most living activities. Usually no treatment is indicated apart from advice on lifting, sitting and exercise  21-40% Moderate Disability The patient experiences more pain and difficulty with sitting, lifting and standing. Travel and social life are more difficult and they may be disabled from work. Personal care, sexual activity and sleeping are not grossly affected, and the patient can usually be managed by conservative means  41-60% Severe Disability Pain remains the main problem in this group, but activities of daily living are affected. These patients require a detailed investigation  61-80% Crippled Back pain impinges on all aspects of the patient's life. Positive intervention is required  81-100% Bed-bound  These patients are either bed-bound or exaggerating their symptoms  Bluford FORBES Zoe DELENA Karon DELENA, et al. Surgery versus conservative management of stable thoracolumbar fracture: the PRESTO feasibility RCT. [place]): [employer]; 2021 Nov. [facility] Technology Assessment, No. 25.62.) Appendix 3, Oswestry Disability Index category descriptors. Available from: FindJewelers.cz  Minimally Clinically Important Difference (MCID) = 12.8%  COGNITION: Overall cognitive status: Within functional limits for tasks assessed, very pleasant pt can become tangential and requires redirection to activity    SENSATION: Intact to light touch BLE   MUSCLE LENGTH: deferred  POSTURE: increased thoracic kyphosis, rounded shoulders, unable to achieve full upright position due to pain, maintains increased hip and knee flexion when attempting to stand fully upright   PALPATION: deferred  Thoracolumbar AROM: Extension - unable to achieve neutral position, remains in significant flexion  Flexion -  lacking at least 25%  Rotation - lacking at least 40% bilat  and pain-limited    LOWER EXTREMITY MMT:    MMT Right eval Left eval  Hip flexion 4- 3*  Hip extension    Hip abduction 4+ 4+  Hip adduction 4+ 4+  Hip internal rotation    Hip external rotation    Knee flexion 4 3*  Knee extension 4+ 4+  Ankle dorsiflexion 4 4+  Ankle plantarflexion    Ankle inversion    Ankle eversion     (Blank rows = not tested) *=pain limited   LUMBAR SPECIAL TESTS:  deferred  FUNCTIONAL TESTS:  10 meter walk test: 0.63 m/s crouched posture, decreased hip extension, decreased step-length and heel strike, elevated R>L shoulder, heavy BUE weightbearing on 4WW  GAIT: Distance walked: 10MWT/clinic distances (see above) Assistive device utilized: Environmental consultant - 4 wheeled Level of assistance: Modified independence Comments: gait mechanics impaired: decreased gait speed, crouched posture, decreased hip extension, decreased step-length and heel strike, elevated R>L shoulder, heavy BUE weightbearing on 4WW  TREATMENT DATE: 11/01/2023  Physical therapy treatment session today consisted of completing assessment of goals and administration of testing as demonstrated and documented in flow sheet, treatment, and goals section of this note. Addition treatments may be found below.   MODIFIED OSWESTRY DISABILITY SCALE  Date: 11/06/2023 Score  Pain intensity 3 =  Pain medication provides me with moderate relief from pain.  2. Personal care (washing, dressing, etc.) 0 =  I can take care of myself normally without causing increased pain.  3. Lifting 3 = Pain prevents me from lifting heavy weights, but I can manage (5) I have hardly any social life because of my pain. light to medium weights if they are conveniently positioned  4. Walking 3 =  Pain prevents me from walking more than  mile.  5.  Sitting 1 =  I can only sit in my favorite chair as long as I like.  6. Standing 3 =  Pain prevents me from standing more  than 1/2 hour.  7. Sleeping 1 = I can sleep well only by using pain medication.  8. Social Life 0 = My social life is normal and does not increase my pain.  9. Traveling 0 =  I can travel anywhere without increased pain.  10. Employment/ Homemaking 2 = I can perform most of my homemaking/job duties, but pain prevents me from performing more physically stressful activities (eg, lifting, vacuuming).  Total 16/50 or 32%   Interpretation of scores: Score Category Description  0-20% Minimal Disability The patient can cope with most living activities. Usually no treatment is indicated apart from advice on lifting, sitting and exercise  21-40% Moderate Disability The patient experiences more pain and difficulty with sitting, lifting and standing. Travel and social life are more difficult and they may be disabled from work. Personal care, sexual activity and sleeping are not grossly affected, and the patient can usually be managed by conservative means  41-60% Severe Disability Pain remains the main problem in this group, but activities of daily living are affected. These patients require a detailed investigation  61-80% Crippled Back pain impinges on all aspects of the patient's life. Positive intervention is required  81-100% Bed-bound  These patients are either bed-bound or exaggerating their symptoms  Bluford FORBES Zoe DELENA Karon DELENA, et al. Surgery versus conservative management of stable thoracolumbar fracture: the PRESTO feasibility RCT. [place]): [employer]; 2021 Nov. [facility] Technology Assessment, No. 25.62.) Appendix 3, Oswestry Disability Index category descriptors. Available from: FindJewelers.cz  Minimally Clinically Important Difference (MCID) = 12.8%   Pt performed 5 time sit<>stand (5xSTS):  22.31 sec without UE Support (>15 sec indicates increased fall risk)     BERG:  10 Meter Walk Test: Patient instructed to walk 10 meters (32.8 ft) as  quickly and as safely as possible at their normal speed x2 and at a fast speed x2. Time measured from 2 meter mark to 8 meter mark to accommodate ramp-up and ramp-down.  Normal speed 1: 13.88 m/s Normal speed 2: 13.23 m/s Average Normal speed: 0.74 m/s Cut off scores: <0.4 m/s = household Ambulator, 0.4-0.8 m/s = limited community Ambulator, >0.8 m/s = community Ambulator, >1.2 m/s = crossing a street, <1.0 = increased fall risk MCID 0.05 m/s (small), 0.13 m/s (moderate), 0.06 m/s (significant)  (ANPTA Core Set of Outcome Measures for Adults with Neurologic Conditions, 2018)                                                                                                                PATIENT EDUCATION:  Education details: Provided education on findings of exam, indications for plan, prognoses, goals, how PT can help                 Person educated: Patient Education method: Explanation Education comprehension: verbalized understanding  HOME EXERCISE PROGRAM:  Access Code: [email or web address] URL: [email or web address] Date: 10/05/2023 Prepared by: Lonni Gainer  Exercises - Supine Bridge  - 3 x weekly - 3 sets - 10 reps - Clamshell  - 3 x weekly - 3 sets - 10 reps - Supine Lower Trunk Rotation  - 1 x daily - 3 sets - 30 sec hold - Seated Hamstring Stretch  - 1 x daily - 3 sets - 30 hold - Seated Clamshell  - 1 x daily - 7 x weekly - 2 sets - 20 reps - 3 sec hold - Supine Transversus Abdominis Bracing - Hands on Stomach  - 3 x weekly - 3 sets - 10 reps - Supine Gluteal Sets  - 3 x weekly - 3 sets - 10 reps  ASSESSMENT:  CLINICAL IMPRESSION: Patient presents with good motivation for today's progress note visit. She was able to demonstrate some improvement in her self perceived disability scoring 2 points lower. She also made functional improvement- Taking less time to perform her sit to stand and now ability to stand some without UE support. She demonstrated improved overall  gait speed and actually met her current balance goal. However, she still is limited by low back, hip, and groin pain and patient's condition has the potential to improve in response to therapy. Maximum improvement is yet to be obtained. The anticipated improvement is attainable and reasonable in a generally predictable time.   Pt will continue to benefit from skilled physical therapy intervention to address impairments, improve QOL, and attain therapy goals.    OBJECTIVE IMPAIRMENTS: Abnormal gait, decreased activity tolerance, decreased balance, decreased mobility, difficulty walking, decreased strength, hypomobility, increased edema, improper body mechanics, postural dysfunction, and pain.   ACTIVITY LIMITATIONS: carrying, lifting, bending, squatting, stairs, transfers, bed mobility, and locomotion level  PARTICIPATION LIMITATIONS: meal prep, cleaning, shopping, community activity, and yard work  PERSONAL FACTORS: Age, Fitness, Sex, Time since onset of injury/illness/exacerbation, and 3+ comorbidities: PMH per chart includes arthritis low back and left shoulder, bilateral carpal tunnel, asthma, glaucoma, HTN, leaky heart valve, sleep apnea, pre-diabetes, being seen by lymphedema specialist for BLE lymphedema  are also affecting patient's functional outcome.   REHAB POTENTIAL: Good  CLINICAL DECISION MAKING: Evolving/moderate complexity  EVALUATION COMPLEXITY: Moderate   GOALS: Goals reviewed with patient? Yes   SHORT TERM GOALS: Target date: 10/31/2023    Patient will be independent in home exercise program to improve strength/mobility for better functional independence with ADLs. Baseline:to be initiated; 11/06/2023=Patient reports knowledgeable of HEP but not always able to perform and sometimes painful so has to modify.  Goal status: MET   LONG TERM GOALS: Target date: 12/12/2023    Patient will reduce modified Oswestry score to <20 as to demonstrate minimal disability with ADLs  including improved sleeping tolerance, walking/sitting tolerance etc for better mobility with ADLs.  Baseline: 34; 11/06/2023= 32 Goal status: INITIAL  2.  Patient (> 81 years old) will complete five times sit to stand test in < 15 seconds indicating an increased LE strength and improved balance. Baseline: 09/26/2023= 26.46 sec with heavy UE Support; 11/06/2023= 11/06/2023= 22.31 sec without UE Support Goal status: PROGRESSING  3.  Patient will increase Berg Balance score by > 6 points to demonstrate decreased fall risk during functional activities Baseline: 09/26/2023= 31/56; 11/06/2023= 40/56 Goal status: MET  4.  Patient will increase 10 meter walk test to >1.84m/s as to improve gait speed for better community ambulation and to reduce fall risk. Baseline:  0.63 m/s with 4WW; 0.74 m/s Goal  status: PROGRESSING      PLAN:  PT FREQUENCY: 1-2x/week  PT DURATION: 12 weeks  PLANNED INTERVENTIONS: [id number]- PT Re-evaluation, 97750- Physical Performance Testing, 97110-Therapeutic exercises, 97530- Therapeutic activity, [id number]- Neuromuscular re-education, 97535- Self Care, [id number]- Manual therapy, [id number]- Gait training, [phone]- Orthotic Initial, [phone]- Orthotic/Prosthetic subsequent, [phone]- Canalith repositioning, Patient/Family education, Balance training, Stair training, Taping, Joint mobilization, Spinal mobilization, Vestibular training, DME instructions, Cryotherapy, and Moist heat.  PLAN FOR NEXT SESSION:   Continue to progress HEP Manual therapy for lumbar ROM/pain Therex for ROM, Low back/LE/Core strengthening Ensure log roll is being performed properly    Reyes LOISE London, PT 11/07/2023, 8:40 AM

## 2023-11-06 ENCOUNTER — Ambulatory Visit

## 2023-11-06 ENCOUNTER — Ambulatory Visit: Admitting: Occupational Therapy

## 2023-11-06 DIAGNOSIS — M6281 Muscle weakness (generalized): Secondary | ICD-10-CM

## 2023-11-06 DIAGNOSIS — M25551 Pain in right hip: Secondary | ICD-10-CM

## 2023-11-06 DIAGNOSIS — M5459 Other low back pain: Secondary | ICD-10-CM | POA: Diagnosis not present

## 2023-11-06 DIAGNOSIS — R278 Other lack of coordination: Secondary | ICD-10-CM | POA: Diagnosis not present

## 2023-11-06 DIAGNOSIS — I890 Lymphedema, not elsewhere classified: Secondary | ICD-10-CM

## 2023-11-06 DIAGNOSIS — R262 Difficulty in walking, not elsewhere classified: Secondary | ICD-10-CM

## 2023-11-06 NOTE — Therapy (Signed)
 OUTPATIENT OCCUPATIONAL THERAPY TREATMENT NOTE  BILATERAL LOWER EXTREMITY LYMPHEDEMA  Patient Name: DAMARA KLUNDER MRN: [id number] DOB:November 22, 1950, 73 y.o., female Today's Date: 11/06/2023  REPORTING PERIOD:   END OF SESSION:   OT End of Session - 11/06/23 1521     Visit Number 28    Number of Visits 36    Date for OT Re-Evaluation 01/16/24    OT Start Time 0310    OT Stop Time 0404    OT Time Calculation (min) 54 min    Activity Tolerance Patient tolerated treatment well;No increased pain           Past Medical History:  Diagnosis Date   Allergy    Arthritis    lower back, left shoulder   Asthma    Carpal tunnel syndrome on both sides    Dental bridge present    top   Dental crown present    multiple   Family history of adverse reaction to anesthesia    Father and sister - PONV   GERD (gastroesophageal reflux disease)    Glaucoma    Hypertension    Leaky heart valve    Pre-diabetes    Sleep apnea    Past Surgical History:  Procedure Laterality Date   CATARACT EXTRACTION W/PHACO Right 11/16/2016   Procedure: CATARACT EXTRACTION PHACO AND INTRAOCULAR LENS PLACEMENT (IOC)  right;  Surgeon: Mittie Gaskin, MD;  Location: [facility];  Service: Ophthalmology;  Laterality: Right;  pre diabetic latex sensitivity sleep apnea   CATARACT EXTRACTION W/PHACO Left 01/18/2017   Procedure: CATARACT EXTRACTION PHACO AND INTRAOCULAR LENS PLACEMENT (IOC) LEFT DIABETIC;  Surgeon: Mittie Gaskin, MD;  Location: [facility];  Service: Ophthalmology;  Laterality: Left;   COLONOSCOPY     COLONOSCOPY WITH PROPOFOL  N/A 04/09/2018   Procedure: COLONOSCOPY WITH PROPOFOL ;  Surgeon: Viktoria Lamar DASEN, MD;  Location: [facility];  Service: Endoscopy;  Laterality: N/A;   IRIDOTOMY / IRIDECTOMY Bilateral 2005   TOOTH EXTRACTION     Patient Active Problem List   Diagnosis Date Noted   Chronic bilateral low back pain with right-sided sciatica 09/29/2023    Hypertension associated with diabetes (HCC) 01/20/2023   Combined hyperlipidemia associated with type 2 diabetes mellitus (HCC) 01/20/2023   Intertrigo 11/01/2022   Stasis dermatitis of both legs 10/21/2022   Leg swelling 10/21/2022   Lymphedema of lower extremity 10/21/2022   Type 2 diabetes mellitus without complication, without long-term current use of insulin (HCC) 05/24/2022   Mixed hyperlipidemia 05/24/2022   Essential hypertension, benign 05/24/2022   Absolute anemia 05/24/2022   Gastroesophageal reflux disease without esophagitis 05/24/2022   Allergic rhinitis 05/24/2022   Asthma, allergic, moderate persistent, uncomplicated 05/24/2022   Bilateral chronic angle-closure glaucoma, indeterminate stage 05/24/2022   Airway hyperreactivity 08/29/2013   OSA on CPAP 08/29/2013    PCP: Fredy GORMAN Bathe, MD  REFERRING PROVIDER: same  REFERRING DIAG: lymphedema I89.0  THERAPY DIAG:  Lymphedema, not elsewhere classified  Rationale for Evaluation and Treatment: Rehabilitation  ONSET DATE: Pt reports onset of L ankle / leg swelling after injury 20-30 yrs ago. Then about 1 year ago legs started swelling and Pt having prickly sensation on shins and thighs bilaterally.   SUBJECTIVE:  SUBJECTIVE STATEMENT: Georgia Delsignore presents to OT for treatment of BLE lymphedema 2/2 suspected CVI and obesity. Pt is unaccompanied today. Pt denies LE related pain. She brings clean wraps to clinic rolled and ready to re apply. Pt continues to get help from a friend for wrapping between sessions.  (INITIAL EVAL 07/12/23 : Pascale Maves is referred to Occupational Therapy by Fredy GORMAN Bathe, MD,  for evaluation and treatment of BLE lymphedema.  Pt reports onset of L ankle and distal leg swelling about 30 years ago, then  noticed RLE swelling and worsening LLE swelling about 1 year ago at same time she started having unusual sensation in legs. Pt denies previously having lymphedema treatment. Pt reports paternal aunt had leg swelling. Pt is unable to wear compression stockings because she is unable to reach feet and distal legs to don and doff them due to back and hip pain.  PERTINENT HISTORY: Relevant to lymphedema (OSA (denies using CPAP), BLE stasis dermatitis, HTN, DM 2 (Pt states she is prediabetic only), B glaucoma, Back and R hip OA  PAIN:  Are you having LE-related pain? Yes: NPRS scale: 0/10 Pain location: medial proximal thighs w abduction Pain description: like stickers Aggravating factors: standing, walking, dependent sitting Relieving factors: elevation  PRECAUTIONS: Fall and Other: LYMPHEDEMA precautions  (DM 2 and asthma)  WEIGHT BEARING RESTRICTIONS: No  FALLS:  Has patient fallen in last 6 months? yes Fell asleep at the computer and fell onto the floor Dresser when storm door hit me  LIVING ENVIRONMENT: Lives with: alone Lives in: House/apartment Stairs: No;  Has following equipment at home: Environmental consultant - 4 wheeled  OCCUPATION: retired Scientist, research (life sciences)  LEISURE: reading and taking notes on current events and politics  HAND DOMINANCE: right   PRIOR LEVEL OF FUNCTION: Independent with household mobility with device, Requires assistive device for independence, Needs assistance with ADLs, Needs assistance with homemaking, and Needs assistance with gait  PATIENT GOALS: reduce swelling and keep it from getting worse  OBJECTIVE: Note: Objective measures were completed at Evaluation unless otherwise noted.  COGNITION:  Overall cognitive status: impaired short term  memory   OBSERVATIONS / OTHER ASSESSMENTS: Mild, Stage II, BLE Lymphedema 2/2 suspected venous insufficiency.   SENSATION: Reports uncomfortable scratching, or sticker-like  sensation on anterior thighs   POSTURE:  Raised walker handles 2 notches to increase upright spinal alignment when walking to limit falls risk. Handles need to be raised at least 1 hole more for safe upright posture when walking.  Upright sitting posture: head forward , shoulders forward and rounded- appears to be flexible kyphosis  LE ROM: WFL for ankles and knees. Pt reports limited hip abduction )  LE MMT: WFL FOR LYMPHEDEMA CARE. Presenting with generalized weakness and Pt reports debility  LYMPHEDEMA ASSESSMENTS:   SURGERY TYPE/DATE: Non-cancer related limb swelling  HX INFECTIONS: positive for 1 episode cellulitis treated w antibiotics  Hx WOUNDS: denies   BLE COMPARATIVE LIMB VOLUMETRICS: INITIAL 07/19/23  LANDMARK RIGHT (dominant)  R LEG (A-D) 3525.1 ml  R THIGH (E-G) ml  R FULL LIMB (A-G) ml  Limb Volume differential (LVD)  %  Volume change since initial %  Volume change overall V  (Blank rows = not tested)  LANDMARK LEFT    L LEG (A-D) 3769.6 ml  L THIGH (E-G) ml  L FULL LIMB (A-G) ml  Limb Volume differential (LVD)  Limb Volume Differential (LVD) measures 6.5%, L>R.  Volume change since initial %  Volume change overall %  RLE COMPARATIVE LIMB VOLUMETRICS: VISIT 9 08/15/23  LANDMARK RIGHT (dominant)  R LEG (A-D) 3549.4 ml  R THIGH (E-G) ml  R FULL LIMB (A-G) ml  Limb Volume differential (LVD)  %  Volume change since initial R LEG volume reduced by 5.84% since initially measured on 07/18/21  Volume change overall V  (Blank rows = not tested)  RLE COMPARATIVE LIMB VOLUMETRICS: VISIT 19 10/03/23  LANDMARK RIGHT (dominant)  R LEG (A-D) 3549.4 ml  R THIGH (E-G) ml  R FULL LIMB (A-G) ml  Limb Volume differential (LVD)  %  Volume change since initial R LEG volume reduced by 14.7% since initially measured on 07/18/21  Volume change overall V  (Blank rows = not tested)  Mild, Stage  II, Bilateral Lower Extremity Lymphedema 2/2 CVI and Obesity  Skin  Description Hyper-Keratosis Peau' de Orange Shiny  Tight Fibrotic/ Indurated Fatty Doughy Spongy/ boggy       R>L x  x   Skin dry Flaky WNL Macerated   mildly      Color Redness Varicosities Blanching Hemosiderin Stain Mottled   x     x   Odor Malodorous Yeast Fungal infection  WNL      x   Temperature Warm Cool wnl    x     Pitting Edema   1+ 2+ 3+ 4+ Non-pitting         x   Girth Symmetrical Asymmetrical                   Distribution    R>L toes to groin    Stemmer Sign Positive Negative   +    Lymphorrhea History Of:  Present Absent     x    Wounds History Of Present Absent Venous Arterial Pressure Sheer     x        Signs of Infection Redness Warmth Erythema Acute Swelling Drainage Borders                    Sensation Light Touch Deep pressure Hypersensitivity   Present Impaired Present Impaired Absent Impaired   x Tactile  x  x     Nails WNL   Fungus nail dystrophy   x     Hair Growth Symmetrical Asymmetrical   x    Skin Creases Base of toes  Ankles   Base of Fingers knees       Abdominal pannus Thigh Lobules  Face/neck   x x  x      (Blank rows = not tested)    GAIT: Distance walked: >500' Assistive device utilized: Environmental consultant - 4 wheeled Level of assistance: Modified independence Comments: Pt bent at waist nearly 90 degrees with walker handles in lowest position  LYMPHEDEMA LIFE IMPACT SCALE (LLIS): TBA OT Rx visit 1  TREATMENT DATE:  RLE/RLQ MLD and simultaneous skin care RLE multilayer compression bandaging Pt edu for lymphedema progress towards goals and self-care    PATIENT EDUCATION:  Continued Pt/ CG edu for lymphedema self care home program throughout session. Topics include outcome of comparative limb volumetrics- starting limb volume differentials (LVDs), technology and gradient techniques used for short stretch, multilayer compression wrapping, simple self-MLD, therapeutic lymphatic pumping exercises, skin/nail care, LE precautions, compression garment recommendations and  specifications, wear and care schedule and compression garment donning / doffing w assistive devices. Discussed progress towards all OT goals since commencing CDT. Discussed detrimental impact of obesity on lower and upper extremity lymphedema over time.  Reviewed OT goals for lymphedema care with Pt and discussed progress to date.  All questions answered to the Pt's satisfaction. Good return. Person educated: Patient  Education method: Explanation, Demonstration, and Handouts Education comprehension: verbalized understanding, returned demonstration, verbal cues required, and needs further education   HOME EXERCISE PROGRAM: BLE lymphatic pumping there ex using- 1 sets of 10 reps, each exercise in order-  1-2 x daily, bilaterally Simple self MLD 1 x daily Daily skin care to increase hydration, skin mobility and decrease infection risk- can be done during MLD During Intensive Phase CDT: Compression wraps 23/7 until limb volume reduction complete During Self-management Phase CDT: Fit with appropriate compression garments or alternatives. Consider BLE, knee length, Mediven, custom, CircAid , Velcro style leggings over soft cotton liners.  ASSESSMENT: CLINICAL IMPRESSION: Completed initial fitting for RLE Juxtafit essential knee length legging. Large size is correct, but short length is too short. Contacted DME vendor and requested she assist Pt with exchange for CircAid Juxtafit Large LONG devices for the RLE. After skilled training Pt was able to don and doff the legging with min assistance. She was able to gauge the compression with min A. Continued MLD with simultaneous skin care to RLE/RLQ today without pain. Re applied compression wraps to RLE as established without pain or numbness. Garment alternatives are on order. Awaiting DME delivery from vendor so RLE fitting can proceed. Fit Long Juxtafit on RLE ASAP, then commence CDT to LLE. Cont as per POC.  (INITIAL EVAL 07/12/23: Suprena [employer]  is a 73 yo female presenting with chronic, progressive, BLE swelling 2/2 unknown etiology, which at first glance appears to be circulatory in nature due to skin coloring at distal legs where swelling is most invested. Pt is presently unable to wear traditional elastic compression garments due to difficulty donning and doffing them.  Chronic, progressive lymphedema with associated skin changes, including fibrosis, limits this patent's functional performance in all occupational domains, including functional ambulation and mobility, basic and instrumental ADLs (lower body dressing, LB bathing, fitting street shoes and LB clothing, driving, shopping, and home management. It also limits perform productive activities, leisure pursuits, and participation in social and community activities. BLE lymphedema contributes to elevated infection risk. Pt will benefit from skilled OT for Complete Decongestive Therapy (CDT), which  typically includes manual lymphatic drainage (MLD), skin care to limit' infection risk and increase skin excursion, lymphatic pumping exercise, and during the Intensive Phase multilayer, gradient compression bandaging to reduce limb volume. Once limb volume is reduced as much as possible, Pt is fitted with appropriate compression garments and/ or devices and transitions into the self management phase of care consisting of follow long support PRN.    Pt understands that her fair prognosis will become poor without daily assistance with compression wrapping since she is unable to reach feet and legs to apply them herself. Pt assures OT that she has a friend she believes is willing to assist her between OT sessions. )   OBJECTIVE IMPAIRMENTS: decreased standing and walking tolerance, activity tolerance, decreased balance, decreased knowledge of condition, decreased knowledge of use of DME, decreased mobility, decreased ROM, decreased strength, increased edema, impaired flexibility, impaired sensation,  impaired UE functional use, impaired vision/reception, pain, and chronic, progressive, BLE swelling and associated skin changes, at increased infection risk   ACTIVITY LIMITATIONS: Functional ambulation and mobility (lifting, carrying, steps/stairs, transfers, squatting) , Basic and instrumental ADLs, leisure pursuits, productive activities, social participation  PARTICIPATION LIMITATIONS: meal prep, cleaning, laundry, driving, shopping, yard work,  and    PERSONAL FACTORS: Age, Fitness, Past/current experiences, Time since onset of injury/illness/exacerbation, 2+ co morbidities: OSA, HTN,  are also affecting patient's functional outcome.   REHAB POTENTIAL: Good  EVALUATION COMPLEXITY: Moderate   GOALS: Goals reviewed with patient? Yes  SHORT TERM GOALS: Target date: 4th OT Rx visit   Pt will demonstrate understanding of lymphedema precautions and prevention strategies with modified independence using a printed reference to identify at least 5 precautions and discussing how s/he may implement them into daily life to reduce risk of progression with extra time. Baseline:Max A Goal status: GOAL MET  2.  Pt will be able to apply multilayer, knee length, gradient, compression wraps to one leg at a time from toes to below knee with max caregiver assist to decrease limb volume, to limit infection risk, and to limit lymphedema progression.  Baseline: Dependent Goal status: GOAL MET  LONG TERM GOALS: Target date: 09/19/23  1.Given this patient's Intake score of TBA % on the Lymphedema Life Impact Scale (LLIS), patient will experience a reduction of at least 5 points in her perceived level of functional impairment resulting from lymphedema to improve functional performance and quality of life (QOL). Baseline: TBA % Goal status:  GOAL DEFERRED  2.  Pt will achieve at least a 10% volume reduction in B legs to return limb to typical size and shape, to limit infection risk and LE progression, to  decrease pain, to improve function. Baseline: Dependent Goal status: PROGRESSING. R LEG volume reduced by 14.7% since initially measured on 07/18/21  3.  Pt will obtain appropriate compression garments/devices and achieve modified independence (extra time + assistive devices) with donning/doffing to optimize limb volume reductions and limit LE progression over time. Baseline: Dependent Goal status: PROGRESSING. RLE garment on order  During Intensive phase CDT, with max CG assistance, Pt will achieve at least 85% compliance with all adapted lymphedema self-care home program components, including daily skin care, compression wraps and /or garments, simple self MLD and lymphatic pumping therex to habituate LE self care protocol  into ADLs for optimal LE self-management over time. Baseline: Dependent Goal status: GOAL MET  PLAN:  OT FREQUENCY: 2 x/week  OT DURATION: 12 weeks  PLANNED INTERVENTIONS:compression bandaging, skin care,  97110-Therapeutic exercises, 97530- Therapeutic activity, 97535- Self Care, Manual lymph drainage, DME instructions, and fit with appropriate compression  PLAN FOR NEXT SESSION:  Measure R leg to assess progress towards a stocking, or some alternative Cont Pt edu for LE self-care   Zebedee Dec, MS, OTR/L, CLT-LANA 11/06/23 4:04 PM

## 2023-11-07 ENCOUNTER — Encounter: Payer: Self-pay | Admitting: Internal Medicine

## 2023-11-08 ENCOUNTER — Ambulatory Visit: Admitting: Occupational Therapy

## 2023-11-08 ENCOUNTER — Encounter: Payer: Self-pay | Admitting: Occupational Therapy

## 2023-11-08 ENCOUNTER — Ambulatory Visit

## 2023-11-08 DIAGNOSIS — M6281 Muscle weakness (generalized): Secondary | ICD-10-CM | POA: Diagnosis not present

## 2023-11-08 DIAGNOSIS — I890 Lymphedema, not elsewhere classified: Secondary | ICD-10-CM | POA: Diagnosis not present

## 2023-11-08 DIAGNOSIS — M5459 Other low back pain: Secondary | ICD-10-CM | POA: Diagnosis not present

## 2023-11-08 DIAGNOSIS — R278 Other lack of coordination: Secondary | ICD-10-CM

## 2023-11-08 DIAGNOSIS — R262 Difficulty in walking, not elsewhere classified: Secondary | ICD-10-CM | POA: Diagnosis not present

## 2023-11-08 DIAGNOSIS — M25551 Pain in right hip: Secondary | ICD-10-CM

## 2023-11-08 NOTE — Therapy (Signed)
 OUTPATIENT PHYSICAL THERAPY THORACOLUMBAR TREATMENT   Patient Name: Judith Arnold MRN: [id number] DOB:03-11-1951, 73 y.o., female Today's Date: 11/08/2023  END OF SESSION:  PT End of Session - 11/08/23 1412     Visit Number 11    Number of Visits 25    Date for PT Re-Evaluation 12/12/23    Progress Note Due on Visit 20    PT Start Time 1400    PT Stop Time 1444    PT Time Calculation (min) 44 min    Equipment Utilized During Treatment Gait belt    Activity Tolerance Patient tolerated treatment well;Patient limited by pain    Behavior During Therapy WFL for tasks assessed/performed                Past Medical History:  Diagnosis Date   Allergy    Arthritis    lower back, left shoulder   Asthma    Carpal tunnel syndrome on both sides    Dental bridge present    top   Dental crown present    multiple   Family history of adverse reaction to anesthesia    Father and sister - PONV   GERD (gastroesophageal reflux disease)    Glaucoma    Hypertension    Leaky heart valve    Pre-diabetes    Sleep apnea    Past Surgical History:  Procedure Laterality Date   CATARACT EXTRACTION W/PHACO Right 11/16/2016   Procedure: CATARACT EXTRACTION PHACO AND INTRAOCULAR LENS PLACEMENT (IOC)  right;  Surgeon: Mittie Gaskin, MD;  Location: [facility];  Service: Ophthalmology;  Laterality: Right;  pre diabetic latex sensitivity sleep apnea   CATARACT EXTRACTION W/PHACO Left 01/18/2017   Procedure: CATARACT EXTRACTION PHACO AND INTRAOCULAR LENS PLACEMENT (IOC) LEFT DIABETIC;  Surgeon: Mittie Gaskin, MD;  Location: [facility];  Service: Ophthalmology;  Laterality: Left;   COLONOSCOPY     COLONOSCOPY WITH PROPOFOL  N/A 04/09/2018   Procedure: COLONOSCOPY WITH PROPOFOL ;  Surgeon: Viktoria Lamar DASEN, MD;  Location: [facility];  Service: Endoscopy;  Laterality: N/A;   IRIDOTOMY / IRIDECTOMY Bilateral 2005   TOOTH EXTRACTION     Patient Active Problem  List   Diagnosis Date Noted   Chronic bilateral low back pain with right-sided sciatica 09/29/2023   Hypertension associated with diabetes (HCC) 01/20/2023   Combined hyperlipidemia associated with type 2 diabetes mellitus (HCC) 01/20/2023   Intertrigo 11/01/2022   Stasis dermatitis of both legs 10/21/2022   Leg swelling 10/21/2022   Lymphedema of lower extremity 10/21/2022   Type 2 diabetes mellitus without complication, without long-term current use of insulin (HCC) 05/24/2022   Mixed hyperlipidemia 05/24/2022   Essential hypertension, benign 05/24/2022   Absolute anemia 05/24/2022   Gastroesophageal reflux disease without esophagitis 05/24/2022   Allergic rhinitis 05/24/2022   Asthma, allergic, moderate persistent, uncomplicated 05/24/2022   Bilateral chronic angle-closure glaucoma, indeterminate stage 05/24/2022   Airway hyperreactivity 08/29/2013   OSA on CPAP 08/29/2013    PCP: Fernand Fredy RAMAN, MD  REFERRING PROVIDER: Fernand Fredy, RAMAN, MD  REFERRING DIAG:  Diagnosis  G89.29,M54.41 (ICD-10-CM) - Chronic bilateral low back pain with right-sided sciatica    Rationale for Evaluation and Treatment: Rehabilitation  THERAPY DIAG:  Other low back pain  Pain in right hip  Muscle weakness (generalized)  Difficulty in walking, not elsewhere classified  Other lack of coordination  ONSET DATE: >3 years   SUBJECTIVE:  SUBJECTIVE STATEMENT:  Today:  Patient reports doing okay with no new complaints today. Denies any falls and reports her pain is about the same.    Patient reports 3/10 low back pain.   From EVAL: Pt is a pleasant 73 y/o female presenting to PT eval for chronic LBP with R side hip pain. She reports onset of LBP over 3 years ago. Primary site of pain is mostly felt in R hip and  lower spine. Pt reports hx of arthritis in lower back and R hip. She is now starting to feel arthritis pain in her knees as well. Reports RLE is actually her good leg, has some issues with pain into groin region of LLE. Pt states strength in legs has been impacted and that her general mobility is worse. Pt can only ambulate comfortably by taking weight off of lower back by using her 4WW. She states not using 4WW so much for balance, but for reducing pain with gait. She is unable to stand up fully due to pain. Pt says she used to be very fit (took take karate, gymnastics years ago). She is also seeing OT for BLE lymphedema management. She endorses difficulty getting out of chairs, difficulty with steps, she must use a ramp to get in and out of her home. Pt reports my brain does not retain what I'm talking about, can lose train of thought.  PERTINENT HISTORY:  PMH per chart includes arthritis low back and left shoulder, bilateral carpal tunnel, asthma, glaucoma, HTN, leaky heart valve, sleep apnea, pre-diabetes, being seen by lymphedema specialist for BLE lymphedema   PAIN:  Are you having pain? LBP and R hip pain  Lowest pain level: 0/10  With standing: 2/10  Worst: 5-6/10   PRECAUTIONS: LATEX ALLERGY per chart, fall  RED FLAGS: None   WEIGHT BEARING RESTRICTIONS: No  FALLS:  Has patient fallen in last 6 months? No  LIVING ENVIRONMENT: Uses ramp to get into home due to difficulty with steps, has 4WW  PLOF: Independent  PATIENT GOALS:  Says due to back pain she is unable to reach my feet, has difficulty getting her socks on without a grabber, wants to be more flexible and stronger to increase ease with these activities and mobility    OBJECTIVE:  Note: Objective measures were completed at Evaluation unless otherwise noted.  DIAGNOSTIC FINDINGS:  No recent pertinent imaging available in chart  PATIENT SURVEYS:  Modified Oswestry score: 34%  Interpretation of scores: Score  Category Description  0-20% Minimal Disability The patient can cope with most living activities. Usually no treatment is indicated apart from advice on lifting, sitting and exercise  21-40% Moderate Disability The patient experiences more pain and difficulty with sitting, lifting and standing. Travel and social life are more difficult and they may be disabled from work. Personal care, sexual activity and sleeping are not grossly affected, and the patient can usually be managed by conservative means  41-60% Severe Disability Pain remains the main problem in this group, but activities of daily living are affected. These patients require a detailed investigation  61-80% Crippled Back pain impinges on all aspects of the patient's life. Positive intervention is required  81-100% Bed-bound  These patients are either bed-bound or exaggerating their symptoms  Bluford FORBES Zoe DELENA Karon DELENA, et al. Surgery versus conservative management of stable thoracolumbar fracture: the PRESTO feasibility RCT. [place]): [employer]; 2021 Nov. [facility] Technology Assessment, No. 25.62.) Appendix 3, Oswestry Disability Index category descriptors. Available from:  FindJewel[employer].cz  Minimally Clinically Important Difference (MCID) = 12.8%  COGNITION: Overall cognitive status: Within functional limits for tasks assessed, very pleasant pt can become tangential and requires redirection to activity    SENSATION: Intact to light touch BLE   MUSCLE LENGTH: deferred  POSTURE: increased thoracic kyphosis, rounded shoulders, unable to achieve full upright position due to pain, maintains increased hip and knee flexion when attempting to stand fully upright   PALPATION: deferred  Thoracolumbar AROM: Extension - unable to achieve neutral position, remains in significant flexion  Flexion - lacking at least 25%  Rotation - lacking at least 40% bilat  and pain-limited    LOWER  EXTREMITY MMT:    MMT Right eval Left eval  Hip flexion 4- 3*  Hip extension    Hip abduction 4+ 4+  Hip adduction 4+ 4+  Hip internal rotation    Hip external rotation    Knee flexion 4 3*  Knee extension 4+ 4+  Ankle dorsiflexion 4 4+  Ankle plantarflexion    Ankle inversion    Ankle eversion     (Blank rows = not tested) *=pain limited   LUMBAR SPECIAL TESTS:  deferred  FUNCTIONAL TESTS:  10 meter walk test: 0.63 m/s crouched posture, decreased hip extension, decreased step-length and heel strike, elevated R>L shoulder, heavy BUE weightbearing on 4WW  GAIT: Distance walked: 10MWT/clinic distances (see above) Assistive device utilized: Environmental consultant - 4 wheeled Level of assistance: Modified independence Comments: gait mechanics impaired: decreased gait speed, crouched posture, decreased hip extension, decreased step-length and heel strike, elevated R>L shoulder, heavy BUE weightbearing on 4WW  TREATMENT DATE: 11/08/2023  TA:  Sit to stand from raised mat table (approx 25 in) without UE support  X 10 then another 5 reps after 1 min rest  Resistive gait- 2# AW x 300 feet using her 4WW- Unable to stand or walk erect but able to hold head up more consistent and maintained continuous motion with walker.     Side stepping over obstacles in // bars (4 1/2 foam rolls and 1 pool noodle) down and back with 2# AW x 3 with BUE Support- Difficulty clearing LLE  Forward step over obstacles in // bars (4 1/2 foam rolls and 1 pool noodle) down and back with 2# AW   x 5     - Difficulty clearing LLE up/over foam roll- removed ankle weight nad much improved clearance.   Seated hip swing up/over 1/2 foam roll with 2# AW 2 x 10 reps each LE.                                                                                                   PATIENT EDUCATION:  Education details: Provided education on findings of exam, indications for plan, prognoses, goals, how PT can help                  Person educated: Patient Education method: Explanation Education comprehension: verbalized understanding  HOME EXERCISE PROGRAM: Access Code: [email or web address] URL: [email or web address] Date: 10/05/2023 Prepared by: Lonni Gainer  Exercises - Supine Bridge  - 3  x weekly - 3 sets - 10 reps - Clamshell  - 3 x weekly - 3 sets - 10 reps - Supine Lower Trunk Rotation  - 1 x daily - 3 sets - 30 sec hold - Seated Hamstring Stretch  - 1 x daily - 3 sets - 30 hold - Seated Clamshell  - 1 x daily - 7 x weekly - 2 sets - 20 reps - 3 sec hold - Supine Transversus Abdominis Bracing - Hands on Stomach  - 3 x weekly - 3 sets - 10 reps - Supine Gluteal Sets  - 3 x weekly - 3 sets - 10 reps  ASSESSMENT:  CLINICAL IMPRESSION: Patient arrived with good motivation today and performed better overall- able to sit to stand without UE support and then able to show some in session progression as she was eventually able to clear her left LE with stepping over the 1/2 foam rolls in // bars.   Pt will continue to benefit from skilled physical therapy intervention to address impairments, improve QOL, and attain therapy goals.    OBJECTIVE IMPAIRMENTS: Abnormal gait, decreased activity tolerance, decreased balance, decreased mobility, difficulty walking, decreased strength, hypomobility, increased edema, improper body mechanics, postural dysfunction, and pain.   ACTIVITY LIMITATIONS: carrying, lifting, bending, squatting, stairs, transfers, bed mobility, and locomotion level  PARTICIPATION LIMITATIONS: meal prep, cleaning, shopping, community activity, and yard work  PERSONAL FACTORS: Age, Fitness, Sex, Time since onset of injury/illness/exacerbation, and 3+ comorbidities: PMH per chart includes arthritis low back and left shoulder, bilateral carpal tunnel, asthma, glaucoma, HTN, leaky heart valve, sleep apnea, pre-diabetes, being seen by lymphedema specialist for BLE lymphedema  are also affecting  patient's functional outcome.   REHAB POTENTIAL: Good  CLINICAL DECISION MAKING: Evolving/moderate complexity  EVALUATION COMPLEXITY: Moderate   GOALS: Goals reviewed with patient? Yes   SHORT TERM GOALS: Target date: 10/31/2023    Patient will be independent in home exercise program to improve strength/mobility for better functional independence with ADLs. Baseline:to be initiated; 11/06/2023=Patient reports knowledgeable of HEP but not always able to perform and sometimes painful so has to modify.  Goal status: MET   LONG TERM GOALS: Target date: 12/12/2023    Patient will reduce modified Oswestry score to <20 as to demonstrate minimal disability with ADLs including improved sleeping tolerance, walking/sitting tolerance etc for better mobility with ADLs.  Baseline: 34; 11/06/2023= 32 Goal status: INITIAL  2.  Patient (> 37 years old) will complete five times sit to stand test in < 15 seconds indicating an increased LE strength and improved balance. Baseline: 09/26/2023= 26.46 sec with heavy UE Support; 11/06/2023= 11/06/2023= 22.31 sec without UE Support Goal status: PROGRESSING  3.  Patient will increase Berg Balance score by > 6 points to demonstrate decreased fall risk during functional activities Baseline: 09/26/2023= 31/56; 11/06/2023= 40/56 Goal status: MET  4.  Patient will increase 10 meter walk test to >1.2m/s as to improve gait speed for better community ambulation and to reduce fall risk. Baseline:  0.63 m/s with 4WW; 0.74 m/s Goal status: PROGRESSING      PLAN:  PT FREQUENCY: 1-2x/week  PT DURATION: 12 weeks  PLANNED INTERVENTIONS: [id number]- PT Re-evaluation, 97750- Physical Performance Testing, 97110-Therapeutic exercises, 97530- Therapeutic activity, [id number]- Neuromuscular re-education, 97535- Self Care, [id number]- Manual therapy, [id number]- Gait training, [phone]- Orthotic Initial, [phone]- Orthotic/Prosthetic subsequent, [phone]- Canalith repositioning, Patient/Family  education, Balance training, Stair training, Taping, Joint mobilization, Spinal mobilization, Vestibular training, DME instructions, Cryotherapy, and Moist heat.  PLAN FOR NEXT SESSION:  Continue to progress HEP Manual therapy for lumbar ROM/pain Activities for ROM, Low back/LE/Core strengthening Continue to progress functional mobility and balance.     Reyes LOISE London, PT 11/08/2023, 9:04 PM

## 2023-11-08 NOTE — Therapy (Signed)
 OUTPATIENT OCCUPATIONAL THERAPY TREATMENT NOTE  BILATERAL LOWER EXTREMITY LYMPHEDEMA  Patient Name: Judith Arnold MRN: [id number] DOB:08/02/1950, 73 y.o., female Today's Date: 11/08/2023  REPORTING PERIOD:   END OF SESSION:   OT End of Session - 11/08/23 1307     Visit Number 29    Number of Visits 36    Date for OT Re-Evaluation 01/16/24    OT Start Time 0100    OT Stop Time 0205    OT Time Calculation (min) 65 min    Activity Tolerance Patient tolerated treatment well;No increased pain    Behavior During Therapy WFL for tasks assessed/performed           Past Medical History:  Diagnosis Date   Allergy    Arthritis    lower back, left shoulder   Asthma    Carpal tunnel syndrome on both sides    Dental bridge present    top   Dental crown present    multiple   Family history of adverse reaction to anesthesia    Father and sister - PONV   GERD (gastroesophageal reflux disease)    Glaucoma    Hypertension    Leaky heart valve    Pre-diabetes    Sleep apnea    Past Surgical History:  Procedure Laterality Date   CATARACT EXTRACTION W/PHACO Right 11/16/2016   Procedure: CATARACT EXTRACTION PHACO AND INTRAOCULAR LENS PLACEMENT (IOC)  right;  Surgeon: Judith Gaskin, MD;  Location: [facility];  Service: Ophthalmology;  Laterality: Right;  pre diabetic latex sensitivity sleep apnea   CATARACT EXTRACTION W/PHACO Left 01/18/2017   Procedure: CATARACT EXTRACTION PHACO AND INTRAOCULAR LENS PLACEMENT (IOC) LEFT DIABETIC;  Surgeon: Judith Gaskin, MD;  Location: [facility];  Service: Ophthalmology;  Laterality: Left;   COLONOSCOPY     COLONOSCOPY WITH PROPOFOL  N/A 04/09/2018   Procedure: COLONOSCOPY WITH PROPOFOL ;  Surgeon: Judith Lamar DASEN, MD;  Location: [facility];  Service: Endoscopy;  Laterality: N/A;   IRIDOTOMY / IRIDECTOMY Bilateral 2005   TOOTH EXTRACTION     Patient Active Problem List   Diagnosis Date Noted   Chronic  bilateral low back pain with right-sided sciatica 09/29/2023   Hypertension associated with diabetes (HCC) 01/20/2023   Combined hyperlipidemia associated with type 2 diabetes mellitus (HCC) 01/20/2023   Intertrigo 11/01/2022   Stasis dermatitis of both legs 10/21/2022   Leg swelling 10/21/2022   Lymphedema of lower extremity 10/21/2022   Type 2 diabetes mellitus without complication, without long-term current use of insulin (HCC) 05/24/2022   Mixed hyperlipidemia 05/24/2022   Essential hypertension, benign 05/24/2022   Absolute anemia 05/24/2022   Gastroesophageal reflux disease without esophagitis 05/24/2022   Allergic rhinitis 05/24/2022   Asthma, allergic, moderate persistent, uncomplicated 05/24/2022   Bilateral chronic angle-closure glaucoma, indeterminate stage 05/24/2022   Airway hyperreactivity 08/29/2013   OSA on CPAP 08/29/2013    PCP: Judith GORMAN Bathe, MD  REFERRING PROVIDER: same  REFERRING DIAG: lymphedema I89.0  THERAPY DIAG:  Lymphedema, not elsewhere classified  Rationale for Evaluation and Treatment: Rehabilitation  ONSET DATE: Pt reports onset of L ankle / leg swelling after injury 20-30 yrs ago. Then about 1 year ago legs started swelling and Pt having prickly sensation on shins and thighs bilaterally.   SUBJECTIVE:  SUBJECTIVE STATEMENT: Judith Arnold presents to OT for treatment of BLE lymphedema 2/2 suspected CVI and obesity. Pt is unaccompanied today. Pt denies LE related pain. She brings clean wraps to clinic rolled and ready to re apply. Pt continues to get help from a friend for wrapping between sessions. Pt states, My left leg has gotten a lot bigger.  (INITIAL EVAL 07/12/23 : Judith Arnold is referred to Occupational Therapy by Judith GORMAN Bathe, MD,  for  evaluation and treatment of BLE lymphedema.  Pt reports onset of L ankle and distal leg swelling about 30 years ago, then noticed RLE swelling and worsening LLE swelling about 1 year ago at same time she started having unusual sensation in legs. Pt denies previously having lymphedema treatment. Pt reports paternal aunt had leg swelling. Pt is unable to wear compression stockings because she is unable to reach feet and distal legs to don and doff them due to back and hip pain.  PERTINENT HISTORY: Relevant to lymphedema (OSA (denies using CPAP), BLE stasis dermatitis, HTN, DM 2 (Pt states she is prediabetic only), B glaucoma, Back and R hip OA  PAIN:  Are you having LE-related pain? Yes: NPRS scale: 0/10 Pain location: medial proximal thighs w abduction Pain description: like stickers Aggravating factors: standing, walking, dependent sitting Relieving factors: elevation  PRECAUTIONS: Fall and Other: LYMPHEDEMA precautions  (DM 2 and asthma)  WEIGHT BEARING RESTRICTIONS: No  FALLS:  Has patient fallen in last 6 months? yes Fell asleep at the computer and fell onto the floor [place] when storm door hit me  LIVING ENVIRONMENT: Lives with: alone Lives in: House/apartment Stairs: No;  Has following equipment at home: Environmental consultant - 4 wheeled  OCCUPATION: retired Scientist, research (life sciences)  LEISURE: reading and taking notes on current events and politics  HAND DOMINANCE: right   PRIOR LEVEL OF FUNCTION: Independent with household mobility with device, Requires assistive device for independence, Needs assistance with ADLs, Needs assistance with homemaking, and Needs assistance with gait  PATIENT GOALS: reduce swelling and keep it from getting worse  OBJECTIVE: Note: Objective measures were completed at Evaluation unless otherwise noted.  COGNITION:  Overall cognitive status: impaired short term  memory   OBSERVATIONS / OTHER ASSESSMENTS: Mild, Stage II, BLE Lymphedema 2/2 suspected venous  insufficiency.   SENSATION: Reports uncomfortable scratching, or sticker-like  sensation on anterior thighs   POSTURE: Raised walker handles 2 notches to increase upright spinal alignment when walking to limit falls risk. Handles need to be raised at least 1 hole more for safe upright posture when walking.  Upright sitting posture: head forward , shoulders forward and rounded- appears to be flexible kyphosis  LE ROM: WFL for ankles and knees. Pt reports limited hip abduction )  LE MMT: WFL FOR LYMPHEDEMA CARE. Presenting with generalized weakness and Pt reports debility  LYMPHEDEMA ASSESSMENTS:   SURGERY TYPE/DATE: Non-cancer related limb swelling  HX INFECTIONS: positive for 1 episode cellulitis treated w antibiotics  Hx WOUNDS: denies   BLE COMPARATIVE LIMB VOLUMETRICS: INITIAL 07/19/23  LANDMARK RIGHT (dominant)  R LEG (A-D) 3525.1 ml  R THIGH (E-G) ml  R FULL LIMB (A-G) ml  Limb Volume differential (LVD)  %  Volume change since initial %  Volume change overall V  (Blank rows = not tested)  LANDMARK LEFT    L LEG (A-D) 3769.6 ml  L THIGH (E-G) ml  L FULL LIMB (A-G) ml  Limb Volume differential (LVD)  Limb Volume Differential (LVD) measures 6.5%, L>R.  Volume change since initial %  Volume change overall %    RLE COMPARATIVE LIMB VOLUMETRICS: VISIT 9 08/15/23  LANDMARK RIGHT (dominant)  R LEG (A-D) 3549.4 ml  R THIGH (E-G) ml  R FULL LIMB (A-G) ml  Limb Volume differential (LVD)  %  Volume change since initial R LEG volume reduced by 5.84% since initially measured on 07/18/21  Volume change overall V  (Blank rows = not tested)  RLE COMPARATIVE LIMB VOLUMETRICS: VISIT 19 10/03/23  LANDMARK RIGHT (dominant)  R LEG (A-D) 3549.4 ml  R THIGH (E-G) ml  R FULL LIMB (A-G) ml  Limb Volume differential (LVD)  %  Volume change since initial R LEG volume reduced by 14.7% since initially measured on 07/18/21  Volume change overall V    BLE COMPARATIVE LIMB VOLUMETRICS:  29th Visit  LANDMARK RIGHT (dominant)  R LEG (A-D) 3438.1 ml  R THIGH (E-G) ml  R FULL LIMB (A-G) ml  Limb Volume differential (LVD)  %  Volume change since last measured on 10/03/23 (19th visit) DECREASED by 0.67%. R Leg limb volume stable.  Volume change overall Overall L Leg volume reduction of 2.5%. Goal not me to date  (Blank rows = not tested)  LANDMARK LEFT    L LEG (A-D) 4174.4  ml  L THIGH (E-G) ml  L FULL LIMB (A-G) ml  Limb Volume differential (LVD)  .  Volume change since initial on 07/19/23 L LEG volume INCREASED by 10.7% since initially measured on 07/19/23  Volume change overall L LEG volume INCREASED by 10.7& since initially measured on 07/19/23  (Blank rows = not tested)  Mild, Stage  II, Bilateral Lower Extremity Lymphedema 2/2 CVI and Obesity  Skin  Description Hyper-Keratosis Peau' de Orange Shiny Tight Fibrotic/ Indurated Fatty Doughy Spongy/ boggy       R>L x  x   Skin dry Flaky WNL Macerated   mildly      Color Redness Varicosities Blanching Hemosiderin Stain Mottled   x     x   Odor Malodorous Yeast Fungal infection  WNL      x   Temperature Warm Cool wnl    x     Pitting Edema   1+ 2+ 3+ 4+ Non-pitting         x   Girth Symmetrical Asymmetrical                   Distribution    R>L toes to groin    Stemmer Sign Positive Negative   +    Lymphorrhea History Of:  Present Absent     x    Wounds History Of Present Absent Venous Arterial Pressure Sheer     x        Signs of Infection Redness Warmth Erythema Acute Swelling Drainage Borders                    Sensation Light Touch Deep pressure Hypersensitivity   Present Impaired Present Impaired Absent Impaired   x Tactile  x  x     Nails WNL   Fungus nail dystrophy   x     Hair Growth Symmetrical Asymmetrical   x    Skin Creases Base of toes  Ankles   Base of Fingers knees       Abdominal pannus Thigh Lobules  Face/neck   x x  x      (Blank rows = not tested)     GAIT: Distance  walked: >500' Assistive device utilized: Environmental consultant - 4 wheeled Level of assistance: Modified independence Comments: Pt bent at waist nearly 90 degrees with walker handles in lowest position  LYMPHEDEMA LIFE IMPACT SCALE (LLIS): TBA OT Rx visit 1  TREATMENT DATE:  RLE/RLQ MLD and simultaneous skin care RLE multilayer compression bandaging Pt edu for lymphedema progress towards goals and self-care    PATIENT EDUCATION:  Continued Pt/ CG edu for lymphedema self care home program throughout session. Topics include outcome of comparative limb volumetrics- starting limb volume differentials (LVDs), technology and gradient techniques used for short stretch, multilayer compression wrapping, simple self-MLD, therapeutic lymphatic pumping exercises, skin/nail care, LE precautions, compression garment recommendations and specifications, wear and care schedule and compression garment donning / doffing w assistive devices. Discussed progress towards all OT goals since commencing CDT. Discussed detrimental impact of obesity on lower and upper extremity lymphedema over time. Reviewed OT goals for lymphedema care with Pt and discussed progress to date.  All questions answered to the Pt's satisfaction. Good return. Person educated: Patient  Education method: Explanation, Demonstration, and Handouts Education comprehension: verbalized understanding, returned demonstration, verbal cues required, and needs further education   HOME EXERCISE PROGRAM: BLE lymphatic pumping there ex using- 1 sets of 10 reps, each exercise in order-  1-2 x daily, bilaterally Simple self MLD 1 x daily Daily skin care to increase hydration, skin mobility and decrease infection risk- can be done during MLD During Intensive Phase CDT: Compression wraps 23/7 until limb volume reduction complete During Self-management Phase CDT: Fit with appropriate compression garments or alternatives. Consider BLE, knee length, Mediven,  custom, CircAid , Velcro style leggings over soft cotton liners.  ASSESSMENT: CLINICAL IMPRESSION: B Leg volume measurements today reveal some surprises. The L Leg volume, which we have not yet treated, is increased by 10.7% since initially measured on 07/19/23. We know of no possible precipitating event, other than Pt has commenced PT for strengthening, ROM and balance. The R leg appears stable with only 0.67^ change in volume since last measured. Overall the R leg volume is decreased by 2.5%.Upon visual assessment limb volume appears to have reduced quite a bit more than that. R compression leggings are on order and we commenced CDT to LLE as soon as R garments are fitted correctly. Re applied compression wraps as establ;established to the RLE. Cont as per POC.   (INITIAL EVAL 07/12/23: Fahima Graylin Pizza is a 73 yo female presenting with chronic, progressive, BLE swelling 2/2 unknown etiology, which at first glance appears to be circulatory in nature due to skin coloring at distal legs where swelling is most invested. Pt is presently unable to wear traditional elastic compression garments due to difficulty donning and doffing them.  Chronic, progressive lymphedema with associated skin changes, including fibrosis, limits this patent's functional performance in all occupational domains, including functional ambulation and mobility, basic and instrumental ADLs (lower body dressing, LB bathing, fitting street shoes and LB clothing, driving, shopping, and home management. It also limits perform productive activities, leisure pursuits, and participation in social and community activities. BLE lymphedema contributes to elevated infection risk. Pt will benefit from skilled OT for Complete Decongestive Therapy (CDT), which  typically includes manual lymphatic drainage (MLD), skin care to limit' infection risk and increase skin excursion, lymphatic pumping exercise, and during the Intensive Phase multilayer,  gradient compression bandaging to reduce limb volume. Once limb volume is reduced as much as possible, Pt is fitted with appropriate compression garments and/ or devices and transitions into  the self management phase of care consisting of follow long support PRN.    Pt understands that her fair prognosis will become poor without daily assistance with compression wrapping since she is unable to reach feet and legs to apply them herself. Pt assures OT that she has a friend she believes is willing to assist her between OT sessions. )   OBJECTIVE IMPAIRMENTS: decreased standing and walking tolerance, activity tolerance, decreased balance, decreased knowledge of condition, decreased knowledge of use of DME, decreased mobility, decreased ROM, decreased strength, increased edema, impaired flexibility, impaired sensation, impaired UE functional use, impaired vision/reception, pain, and chronic, progressive, BLE swelling and associated skin changes, at increased infection risk   ACTIVITY LIMITATIONS: Functional ambulation and mobility (lifting, carrying, steps/stairs, transfers, squatting) , Basic and instrumental ADLs, leisure pursuits, productive activities, social participation  PARTICIPATION LIMITATIONS: meal prep, cleaning, laundry, driving, shopping, yard work, and    PERSONAL FACTORS: Age, Fitness, Past/current experiences, Time since onset of injury/illness/exacerbation, 2+ co morbidities: OSA, HTN,  are also affecting patient's functional outcome.   REHAB POTENTIAL: Good  EVALUATION COMPLEXITY: Moderate   GOALS: Goals reviewed with patient? Yes  SHORT TERM GOALS: Target date: 4th OT Rx visit   Pt will demonstrate understanding of lymphedema precautions and prevention strategies with modified independence using a printed reference to identify at least 5 precautions and discussing how s/he may implement them into daily life to reduce risk of progression with extra time. Baseline:Max A Goal  status: GOAL MET  2.  Pt will be able to apply multilayer, knee length, gradient, compression wraps to one leg at a time from toes to below knee with max caregiver assist to decrease limb volume, to limit infection risk, and to limit lymphedema progression.  Baseline: Dependent Goal status: GOAL MET  LONG TERM GOALS: Target date: 09/19/23  1.Given this patient's Intake score of TBA % on the Lymphedema Life Impact Scale (LLIS), patient will experience a reduction of at least 5 points in her perceived level of functional impairment resulting from lymphedema to improve functional performance and quality of life (QOL). Baseline: TBA % Goal status:  GOAL DEFERRED  2.  Pt will achieve at least a 10% volume reduction in B legs to return limb to typical size and shape, to limit infection risk and LE progression, to decrease pain, to improve function. Baseline: Dependent Goal status: PROGRESSING. R LEG volume reduced by 14.7% since initially measured on 07/18/21  3.  Pt will obtain appropriate compression garments/devices and achieve modified independence (extra time + assistive devices) with donning/doffing to optimize limb volume reductions and limit LE progression over time. Baseline: Dependent Goal status: PROGRESSING. RLE garment on order  During Intensive phase CDT, with max CG assistance, Pt will achieve at least 85% compliance with all adapted lymphedema self-care home program components, including daily skin care, compression wraps and /or garments, simple self MLD and lymphatic pumping therex to habituate LE self care protocol  into ADLs for optimal LE self-management over time. Baseline: Dependent Goal status: GOAL MET  PLAN:  OT FREQUENCY: 2 x/week  OT DURATION: 12 weeks  PLANNED INTERVENTIONS:compression bandaging, skin care,  97110-Therapeutic exercises, 97530- Therapeutic activity, 97535- Self Care, Manual lymph drainage, DME instructions, and fit with appropriate compression  PLAN  FOR NEXT SESSION:  Measure R leg to assess progress towards a stocking, or some alternative Cont Pt edu for LE self-care   Zebedee Dec, MS, OTR/L, CLT-LANA 11/08/23 2:16 PM

## 2023-11-14 ENCOUNTER — Ambulatory Visit

## 2023-11-14 ENCOUNTER — Ambulatory Visit: Attending: Internal Medicine | Admitting: Occupational Therapy

## 2023-11-14 DIAGNOSIS — R262 Difficulty in walking, not elsewhere classified: Secondary | ICD-10-CM | POA: Insufficient documentation

## 2023-11-14 DIAGNOSIS — R278 Other lack of coordination: Secondary | ICD-10-CM

## 2023-11-14 DIAGNOSIS — M5459 Other low back pain: Secondary | ICD-10-CM

## 2023-11-14 DIAGNOSIS — M25551 Pain in right hip: Secondary | ICD-10-CM | POA: Insufficient documentation

## 2023-11-14 DIAGNOSIS — M6281 Muscle weakness (generalized): Secondary | ICD-10-CM

## 2023-11-14 DIAGNOSIS — I890 Lymphedema, not elsewhere classified: Secondary | ICD-10-CM | POA: Diagnosis not present

## 2023-11-14 NOTE — Therapy (Signed)
 OUTPATIENT PHYSICAL THERAPY THORACOLUMBAR TREATMENT   Patient Name: DAEJAH KLEBBA MRN: [id number] DOB:03-03-1951, 73 y.o., female Today's Date: 11/14/2023  END OF SESSION:  PT End of Session - 11/14/23 1527     Visit Number 12    Number of Visits 25    Date for PT Re-Evaluation 12/12/23    Progress Note Due on Visit 20    PT Start Time 1617    PT Stop Time 1658    PT Time Calculation (min) 41 min    Equipment Utilized During Treatment Gait belt    Activity Tolerance Patient tolerated treatment well;Patient limited by pain    Behavior During Therapy WFL for tasks assessed/performed                 Past Medical History:  Diagnosis Date   Allergy    Arthritis    lower back, left shoulder   Asthma    Carpal tunnel syndrome on both sides    Dental bridge present    top   Dental crown present    multiple   Family history of adverse reaction to anesthesia    Father and sister - PONV   GERD (gastroesophageal reflux disease)    Glaucoma    Hypertension    Leaky heart valve    Pre-diabetes    Sleep apnea    Past Surgical History:  Procedure Laterality Date   CATARACT EXTRACTION W/PHACO Right 11/16/2016   Procedure: CATARACT EXTRACTION PHACO AND INTRAOCULAR LENS PLACEMENT (IOC)  right;  Surgeon: Mittie Gaskin, MD;  Location: [facility];  Service: Ophthalmology;  Laterality: Right;  pre diabetic latex sensitivity sleep apnea   CATARACT EXTRACTION W/PHACO Left 01/18/2017   Procedure: CATARACT EXTRACTION PHACO AND INTRAOCULAR LENS PLACEMENT (IOC) LEFT DIABETIC;  Surgeon: Mittie Gaskin, MD;  Location: [facility];  Service: Ophthalmology;  Laterality: Left;   COLONOSCOPY     COLONOSCOPY WITH PROPOFOL  N/A 04/09/2018   Procedure: COLONOSCOPY WITH PROPOFOL ;  Surgeon: Viktoria Lamar DASEN, MD;  Location: [facility];  Service: Endoscopy;  Laterality: N/A;   IRIDOTOMY / IRIDECTOMY Bilateral 2005   TOOTH EXTRACTION     Patient Active Problem  List   Diagnosis Date Noted   Chronic bilateral low back pain with right-sided sciatica 09/29/2023   Hypertension associated with diabetes (HCC) 01/20/2023   Combined hyperlipidemia associated with type 2 diabetes mellitus (HCC) 01/20/2023   Intertrigo 11/01/2022   Stasis dermatitis of both legs 10/21/2022   Leg swelling 10/21/2022   Lymphedema of lower extremity 10/21/2022   Type 2 diabetes mellitus without complication, without long-term current use of insulin (HCC) 05/24/2022   Mixed hyperlipidemia 05/24/2022   Essential hypertension, benign 05/24/2022   Absolute anemia 05/24/2022   Gastroesophageal reflux disease without esophagitis 05/24/2022   Allergic rhinitis 05/24/2022   Asthma, allergic, moderate persistent, uncomplicated 05/24/2022   Bilateral chronic angle-closure glaucoma, indeterminate stage 05/24/2022   Airway hyperreactivity 08/29/2013   OSA on CPAP 08/29/2013    PCP: Fernand Fredy RAMAN, MD  REFERRING PROVIDER: Fernand Fredy, RAMAN, MD  REFERRING DIAG:  Diagnosis  G89.29,M54.41 (ICD-10-CM) - Chronic bilateral low back pain with right-sided sciatica    Rationale for Evaluation and Treatment: Rehabilitation  THERAPY DIAG:  Other low back pain  Pain in right hip  Muscle weakness (generalized)  Difficulty in walking, not elsewhere classified  Other lack of coordination  ONSET DATE: >3 years   SUBJECTIVE:  SUBJECTIVE STATEMENT:  Today:  Patient reports both legs have new wraps so getting used to them. Reports pain is a 1-2/10 on RLE but around 6/10 on LLE- mostly high groin pain and some Left knee pain. .    Patient reports 3/10 low back pain.   From EVAL: Pt is a pleasant 74 y/o female presenting to PT eval for chronic LBP with R side hip pain. She reports onset of LBP over 3  years ago. Primary site of pain is mostly felt in R hip and lower spine. Pt reports hx of arthritis in lower back and R hip. She is now starting to feel arthritis pain in her knees as well. Reports RLE is actually her good leg, has some issues with pain into groin region of LLE. Pt states strength in legs has been impacted and that her general mobility is worse. Pt can only ambulate comfortably by taking weight off of lower back by using her 4WW. She states not using 4WW so much for balance, but for reducing pain with gait. She is unable to stand up fully due to pain. Pt says she used to be very fit (took take karate, gymnastics years ago). She is also seeing OT for BLE lymphedema management. She endorses difficulty getting out of chairs, difficulty with steps, she must use a ramp to get in and out of her home. Pt reports my brain does not retain what I'm talking about, can lose train of thought.  PERTINENT HISTORY:  PMH per chart includes arthritis low back and left shoulder, bilateral carpal tunnel, asthma, glaucoma, HTN, leaky heart valve, sleep apnea, pre-diabetes, being seen by lymphedema specialist for BLE lymphedema   PAIN:  Are you having pain? LBP and R hip pain  Lowest pain level: 0/10  With standing: 2/10  Worst: 5-6/10   PRECAUTIONS: LATEX ALLERGY per chart, fall  RED FLAGS: None   WEIGHT BEARING RESTRICTIONS: No  FALLS:  Has patient fallen in last 6 months? No  LIVING ENVIRONMENT: Uses ramp to get into home due to difficulty with steps, has 4WW  PLOF: Independent  PATIENT GOALS:  Says due to back pain she is unable to reach my feet, has difficulty getting her socks on without a grabber, wants to be more flexible and stronger to increase ease with these activities and mobility    OBJECTIVE:  Note: Objective measures were completed at Evaluation unless otherwise noted.  DIAGNOSTIC FINDINGS:  No recent pertinent imaging available in chart  PATIENT SURVEYS:   Modified Oswestry score: 34%  Interpretation of scores: Score Category Description  0-20% Minimal Disability The patient can cope with most living activities. Usually no treatment is indicated apart from advice on lifting, sitting and exercise  21-40% Moderate Disability The patient experiences more pain and difficulty with sitting, lifting and standing. Travel and social life are more difficult and they may be disabled from work. Personal care, sexual activity and sleeping are not grossly affected, and the patient can usually be managed by conservative means  41-60% Severe Disability Pain remains the main problem in this group, but activities of daily living are affected. These patients require a detailed investigation  61-80% Crippled Back pain impinges on all aspects of the patient's life. Positive intervention is required  81-100% Bed-bound  These patients are either bed-bound or exaggerating their symptoms  Bluford FORBES Zoe DELENA Karon DELENA, et al. Surgery versus conservative management of stable thoracolumbar fracture: the PRESTO feasibility RCT. [place] ([place]): [employer]; 2021 Nov. (  Health Technology Assessment, No. 25.62.) Appendix 3, Oswestry Disability Index category descriptors. Available from: Find[employer]el[employer].cz  Minimally Clinically Important Difference (MCID) = 12.8%  COGNITION: Overall cognitive status: Within functional limits for tasks assessed, very pleasant pt can become tangential and requires redirection to activity    SENSATION: Intact to light touch BLE   MUSCLE LENGTH: deferred  POSTURE: increased thoracic kyphosis, rounded shoulders, unable to achieve full upright position due to pain, maintains increased hip and knee flexion when attempting to stand fully upright   PALPATION: deferred  Thoracolumbar AROM: Extension - unable to achieve neutral position, remains in significant flexion  Flexion - lacking at least 25%   Rotation - lacking at least 40% bilat  and pain-limited    LOWER EXTREMITY MMT:    MMT Right eval Left eval  Hip flexion 4- 3*  Hip extension    Hip abduction 4+ 4+  Hip adduction 4+ 4+  Hip internal rotation    Hip external rotation    Knee flexion 4 3*  Knee extension 4+ 4+  Ankle dorsiflexion 4 4+  Ankle plantarflexion    Ankle inversion    Ankle eversion     (Blank rows = not tested) *=pain limited   LUMBAR SPECIAL TESTS:  deferred  FUNCTIONAL TESTS:  10 meter walk test: 0.63 m/s crouched posture, decreased hip extension, decreased step-length and heel strike, elevated R>L shoulder, heavy BUE weightbearing on 4WW  GAIT: Distance walked: 10MWT/clinic distances (see above) Assistive device utilized: Environmental consultant - 4 wheeled Level of assistance: Modified independence Comments: gait mechanics impaired: decreased gait speed, crouched posture, decreased hip extension, decreased step-length and heel strike, elevated R>L shoulder, heavy BUE weightbearing on 4WW  TREATMENT DATE: 11/14/23   TA:   Sit to stand from seat of 4WW with min UE support x 12 reps   Dynamic high knee march/walk in // bars - down/back x 3  Side stepping in // bars - down and back x 2 then required brief rest break  Knee flex/walk (butt kicks) down and back in // bars x 2 then brief rest then 1 more down and back.  Forward/bwd step over obstacles in // bars x 10 reps- Increased difficulty extending LLE.   Standing calf raises from 1/2 foam roll x 10 reps   Standing lumbar flex into extension (as pain free as possible) x 12   Resistive gait approx 200 feet using 4WW- with 9# AW x 2 (applied to seat of 4WW)                                                                                                    PATIENT EDUCATION:  Education details: Provided education on findings of exam, indications for plan, prognoses, goals, how PT can help                 Person educated: Patient Education method:  Explanation Education comprehension: verbalized understanding  HOME EXERCISE PROGRAM: Access Code: [email or web address] URL: [email or web address] Date: 10/05/2023 Prepared by: Lonni Gainer  Exercises - Supine Bridge  - 3 x weekly - 3 sets - 10  reps - Clamshell  - 3 x weekly - 3 sets - 10 reps - Supine Lower Trunk Rotation  - 1 x daily - 3 sets - 30 sec hold - Seated Hamstring Stretch  - 1 x daily - 3 sets - 30 hold - Seated Clamshell  - 1 x daily - 7 x weekly - 2 sets - 20 reps - 3 sec hold - Supine Transversus Abdominis Bracing - Hands on Stomach  - 3 x weekly - 3 sets - 10 reps - Supine Gluteal Sets  - 3 x weekly - 3 sets - 10 reps  ASSESSMENT:  CLINICAL IMPRESSION: Treatment continued to focus on LE strengthening as pain free as possible. Patient remains pain limited yet works hard to complete all tasks. She reported her shoes felt heavy but actually performed well with standing activities- performing as directed- closely monitoring for pain. She did endorse some pain but toward end of session- with resistive walking.  Pt will continue to benefit from skilled physical therapy intervention to address impairments, improve QOL, and attain therapy goals.    OBJECTIVE IMPAIRMENTS: Abnormal gait, decreased activity tolerance, decreased balance, decreased mobility, difficulty walking, decreased strength, hypomobility, increased edema, improper body mechanics, postural dysfunction, and pain.   ACTIVITY LIMITATIONS: carrying, lifting, bending, squatting, stairs, transfers, bed mobility, and locomotion level  PARTICIPATION LIMITATIONS: meal prep, cleaning, shopping, community activity, and yard work  PERSONAL FACTORS: Age, Fitness, Sex, Time since onset of injury/illness/exacerbation, and 3+ comorbidities: PMH per chart includes arthritis low back and left shoulder, bilateral carpal tunnel, asthma, glaucoma, HTN, leaky heart valve, sleep apnea, pre-diabetes, being seen by lymphedema  specialist for BLE lymphedema  are also affecting patient's functional outcome.   REHAB POTENTIAL: Good  CLINICAL DECISION MAKING: Evolving/moderate complexity  EVALUATION COMPLEXITY: Moderate   GOALS: Goals reviewed with patient? Yes   SHORT TERM GOALS: Target date: 10/31/2023    Patient will be independent in home exercise program to improve strength/mobility for better functional independence with ADLs. Baseline:to be initiated; 11/06/2023=Patient reports knowledgeable of HEP but not always able to perform and sometimes painful so has to modify.  Goal status: MET   LONG TERM GOALS: Target date: 12/12/2023    Patient will reduce modified Oswestry score to <20 as to demonstrate minimal disability with ADLs including improved sleeping tolerance, walking/sitting tolerance etc for better mobility with ADLs.  Baseline: 34; 11/06/2023= 32 Goal status: INITIAL  2.  Patient (> 6 years old) will complete five times sit to stand test in < 15 seconds indicating an increased LE strength and improved balance. Baseline: 09/26/2023= 26.46 sec with heavy UE Support; 11/06/2023= 11/06/2023= 22.31 sec without UE Support Goal status: PROGRESSING  3.  Patient will increase Berg Balance score by > 6 points to demonstrate decreased fall risk during functional activities Baseline: 09/26/2023= 31/56; 11/06/2023= 40/56 Goal status: MET  4.  Patient will increase 10 meter walk test to >1.85m/s as to improve gait speed for better community ambulation and to reduce fall risk. Baseline:  0.63 m/s with 4WW; 0.74 m/s Goal status: PROGRESSING      PLAN:  PT FREQUENCY: 1-2x/week  PT DURATION: 12 weeks  PLANNED INTERVENTIONS: 97164- PT Re-evaluation, 97750- Physical Performance Testing, 97110-Therapeutic exercises, 97530- Therapeutic activity, [id number]- Neuromuscular re-education, 97535- Self Care, [id number]- Manual therapy, [id number]- Gait training, [phone]- Orthotic Initial, [phone]- Orthotic/Prosthetic subsequent,  [phone]- Canalith repositioning, Patient/Family education, Balance training, Stair training, Taping, Joint mobilization, Spinal mobilization, Vestibular training, DME instructions, Cryotherapy, and Moist heat.  PLAN FOR NEXT  SESSION:   Continue to progress HEP Manual therapy for lumbar ROM/pain Activities for ROM, Low back/LE/Core strengthening Continue to progress functional mobility and balance.     Reyes LOISE London, PT 11/14/2023, 5:07 PM

## 2023-11-14 NOTE — Therapy (Unsigned)
 OUTPATIENT OCCUPATIONAL THERAPY TREATMENT NOTE  AND PROGRESS REPORT  BILATERAL LOWER EXTREMITY LYMPHEDEMA  Patient Name: Judith Arnold MRN: [id number] DOB:Jan 16, 1951, 73 y.o., female Today's Date: 11/15/2023  REPORTING PERIOD: 10/11/23 - 11/14/23  END OF SESSION:   OT End of Session - 11/14/23 1510     Visit Number 30    Number of Visits 36    Date for OT Re-Evaluation 01/16/24    OT Start Time 0305    OT Stop Time 0355    OT Time Calculation (min) 50 min    Activity Tolerance Patient tolerated treatment well;No increased pain    Behavior During Therapy WFL for tasks assessed/performed           Past Medical History:  Diagnosis Date   Allergy    Arthritis    lower back, left shoulder   Asthma    Carpal tunnel syndrome on both sides    Dental bridge present    top   Dental crown present    multiple   Family history of adverse reaction to anesthesia    Father and sister - PONV   GERD (gastroesophageal reflux disease)    Glaucoma    Hypertension    Leaky heart valve    Pre-diabetes    Sleep apnea    Past Surgical History:  Procedure Laterality Date   CATARACT EXTRACTION W/PHACO Right 11/16/2016   Procedure: CATARACT EXTRACTION PHACO AND INTRAOCULAR LENS PLACEMENT (IOC)  right;  Surgeon: Mittie Gaskin, MD;  Location: [facility];  Service: Ophthalmology;  Laterality: Right;  pre diabetic latex sensitivity sleep apnea   CATARACT EXTRACTION W/PHACO Left 01/18/2017   Procedure: CATARACT EXTRACTION PHACO AND INTRAOCULAR LENS PLACEMENT (IOC) LEFT DIABETIC;  Surgeon: Mittie Gaskin, MD;  Location: [facility];  Service: Ophthalmology;  Laterality: Left;   COLONOSCOPY     COLONOSCOPY WITH PROPOFOL  N/A 04/09/2018   Procedure: COLONOSCOPY WITH PROPOFOL ;  Surgeon: Viktoria Lamar DASEN, MD;  Location: [facility];  Service: Endoscopy;  Laterality: N/A;   IRIDOTOMY / IRIDECTOMY Bilateral 2005   TOOTH EXTRACTION     Patient Active Problem List    Diagnosis Date Noted   Chronic bilateral low back pain with right-sided sciatica 09/29/2023   Hypertension associated with diabetes (HCC) 01/20/2023   Combined hyperlipidemia associated with type 2 diabetes mellitus (HCC) 01/20/2023   Intertrigo 11/01/2022   Stasis dermatitis of both legs 10/21/2022   Leg swelling 10/21/2022   Lymphedema of lower extremity 10/21/2022   Type 2 diabetes mellitus without complication, without long-term current use of insulin (HCC) 05/24/2022   Mixed hyperlipidemia 05/24/2022   Essential hypertension, benign 05/24/2022   Absolute anemia 05/24/2022   Gastroesophageal reflux disease without esophagitis 05/24/2022   Allergic rhinitis 05/24/2022   Asthma, allergic, moderate persistent, uncomplicated 05/24/2022   Bilateral chronic angle-closure glaucoma, indeterminate stage 05/24/2022   Airway hyperreactivity 08/29/2013   OSA on CPAP 08/29/2013    PCP: Fredy GORMAN Bathe, MD  REFERRING PROVIDER: same  REFERRING DIAG: lymphedema I89.0  THERAPY DIAG:  Lymphedema, not elsewhere classified  Rationale for Evaluation and Treatment: Rehabilitation  ONSET DATE: Pt reports onset of L ankle / leg swelling after injury 20-30 yrs ago. Then about 1 year ago legs started swelling and Pt having prickly sensation on shins and thighs bilaterally.   SUBJECTIVE:  SUBJECTIVE STATEMENT: Judith Arnold presents to OT for treatment of BLE lymphedema 2/2 suspected CVI and obesity. Pt is unaccompanied today. Pt denies LE related pain. She brings clean wraps to clinic rolled and ready to re apply. Pt continues to get help from a friend for wrapping between sessions. Pt states, My left leg has gotten a lot bigger. Pt brings exchanged LONG CircAid Juxtafit for RLE to clinic for second  fitting.  (INITIAL EVAL 07/12/23 : Judith Arnold is referred to Occupational Therapy by Fredy GORMAN Bathe, MD,  for evaluation and treatment of BLE lymphedema.  Pt reports onset of L ankle and distal leg swelling about 30 years ago, then noticed RLE swelling and worsening LLE swelling about 1 year ago at same time she started having unusual sensation in legs. Pt denies previously having lymphedema treatment. Pt reports paternal aunt had leg swelling. Pt is unable to wear compression stockings because she is unable to reach feet and distal legs to don and doff them due to back and hip pain.  PERTINENT HISTORY: Relevant to lymphedema (OSA (denies using CPAP), BLE stasis dermatitis, HTN, DM 2 (Pt states she is prediabetic only), B glaucoma, Back and R hip OA  PAIN:  Are you having LE-related pain? Yes: NPRS scale: 0/10 Pain location: medial proximal thighs w abduction Pain description: like stickers Aggravating factors: standing, walking, dependent sitting Relieving factors: elevation  PRECAUTIONS: Fall and Other: LYMPHEDEMA precautions  (DM 2 and asthma)  WEIGHT BEARING RESTRICTIONS: No  FALLS:  Has patient fallen in last 6 months? yes Fell asleep at the computer and fell onto the floor [place] when storm door hit me  LIVING ENVIRONMENT: Lives with: alone Lives in: House/apartment Stairs: No;  Has following equipment at home: Environmental consultant - 4 wheeled  OCCUPATION: retired Scientist, research (life sciences)  LEISURE: reading and taking notes on current events and politics  HAND DOMINANCE: right   PRIOR LEVEL OF FUNCTION: Independent with household mobility with device, Requires assistive device for independence, Needs assistance with ADLs, Needs assistance with homemaking, and Needs assistance with gait  PATIENT GOALS: reduce swelling and keep it from getting worse  OBJECTIVE: Note: Objective measures were completed at Evaluation unless otherwise noted.  COGNITION:  Overall cognitive status:  impaired short term  memory   OBSERVATIONS / OTHER ASSESSMENTS: Mild, Stage II, BLE Lymphedema 2/2 suspected venous insufficiency.   SENSATION: Reports uncomfortable scratching, or sticker-like  sensation on anterior thighs   POSTURE: Raised walker handles 2 notches to increase upright spinal alignment when walking to limit falls risk. Handles need to be raised at least 1 hole more for safe upright posture when walking.  Upright sitting posture: head forward , shoulders forward and rounded- appears to be flexible kyphosis  LE ROM: WFL for ankles and knees. Pt reports limited hip abduction )  LE MMT: WFL FOR LYMPHEDEMA CARE. Presenting with generalized weakness and Pt reports debility  LYMPHEDEMA ASSESSMENTS:   SURGERY TYPE/DATE: Non-cancer related limb swelling  HX INFECTIONS: positive for 1 episode cellulitis treated w antibiotics  Hx WOUNDS: denies   BLE COMPARATIVE LIMB VOLUMETRICS: INITIAL 07/19/23  LANDMARK RIGHT (dominant)  R LEG (A-D) 3525.1 ml  R THIGH (E-G) ml  R FULL LIMB (A-G) ml  Limb Volume differential (LVD)  %  Volume change since initial %  Volume change overall V  (Blank rows = not tested)  LANDMARK LEFT    L LEG (A-D) 3769.6 ml  L THIGH (E-G) ml  L FULL LIMB (A-G) ml  Limb Volume differential (LVD)  Limb Volume Differential (LVD) measures 6.5%, L>R.  Volume change since initial %  Volume change overall %    RLE COMPARATIVE LIMB VOLUMETRICS: VISIT 9 08/15/23  LANDMARK RIGHT (dominant)  R LEG (A-D) 3549.4 ml  R THIGH (E-G) ml  R FULL LIMB (A-G) ml  Limb Volume differential (LVD)  %  Volume change since initial R LEG volume reduced by 5.84% since initially measured on 07/18/21  Volume change overall V  (Blank rows = not tested)  RLE COMPARATIVE LIMB VOLUMETRICS: VISIT 19 10/03/23  LANDMARK RIGHT (dominant)  R LEG (A-D) 3549.4 ml  R THIGH (E-G) ml  R FULL LIMB (A-G) ml  Limb Volume differential (LVD)  %  Volume change since initial R LEG volume  reduced by 14.7% since initially measured on 07/18/21  Volume change overall V    BLE COMPARATIVE LIMB VOLUMETRICS: 29th Visit  LANDMARK RIGHT (dominant)  R LEG (A-D) 3438.1 ml  R THIGH (E-G) ml  R FULL LIMB (A-G) ml  Limb Volume differential (LVD)  %  Volume change since last measured on 10/03/23 (19th visit) DECREASED by 0.67%. R Leg limb volume stable.  Volume change overall Overall R Leg volume reduction = 2.5%. Goal not me to date  (Blank rows = not tested)  LANDMARK LEFT    L LEG (A-D) 4174.4  ml  L THIGH (E-G) ml  L FULL LIMB (A-G) ml  Limb Volume differential (LVD)  .  Volume change since initial on 07/19/23 L LEG volume INCREASED by 10.7% since initially measured on 07/19/23  Volume change overall L LEG volume INCREASED by 10.7& since initially measured on 07/19/23  (Blank rows = not tested)  Mild, Stage  II, Bilateral Lower Extremity Lymphedema 2/2 CVI and Obesity  Skin  Description Hyper-Keratosis Peau' de Orange Shiny Tight Fibrotic/ Indurated Fatty Doughy Spongy/ boggy       R>L x  x   Skin dry Flaky WNL Macerated   mildly      Color Redness Varicosities Blanching Hemosiderin Stain Mottled   x     x   Odor Malodorous Yeast Fungal infection  WNL      x   Temperature Warm Cool wnl    x     Pitting Edema   1+ 2+ 3+ 4+ Non-pitting         x   Girth Symmetrical Asymmetrical                   Distribution    R>L toes to groin    Stemmer Sign Positive Negative   +    Lymphorrhea History Of:  Present Absent     x    Wounds History Of Present Absent Venous Arterial Pressure Sheer     x        Signs of Infection Redness Warmth Erythema Acute Swelling Drainage Borders                    Sensation Light Touch Deep pressure Hypersensitivity   Present Impaired Present Impaired Absent Impaired   x Tactile  x  x     Nails WNL   Fungus nail dystrophy   x     Hair Growth Symmetrical Asymmetrical   x    Skin Creases Base of toes  Ankles   Base  of Fingers knees       Abdominal pannus Thigh Lobules  Face/neck   x x  x      (  Blank rows = not tested)    GAIT: Distance walked: >500' Assistive device utilized: Environmental consultant - 4 wheeled Level of assistance: Modified independence Comments: Pt bent at waist nearly 90 degrees with walker handles in lowest position  LYMPHEDEMA LIFE IMPACT SCALE (LLIS): TBA OT Rx visit 1  TREATMENT DATE:  R LEG compression garment alternative fitting Pt edu for lymphedema progress towards goals and self-care    PATIENT EDUCATION:  Continued Pt/ CG edu for lymphedema self care home program throughout session. Topics include outcome of comparative limb volumetrics- starting limb volume differentials (LVDs), technology and gradient techniques used for short stretch, multilayer compression wrapping, simple self-MLD, therapeutic lymphatic pumping exercises, skin/nail care, LE precautions, compression garment recommendations and specifications, wear and care schedule and compression garment donning / doffing w assistive devices. Discussed progress towards all OT goals since commencing CDT. Discussed detrimental impact of obesity on lower and upper extremity lymphedema over time. Reviewed OT goals for lymphedema care with Pt and discussed progress to date.  All questions answered to the Pt's satisfaction. Good return. Person educated: Patient  Education method: Explanation, Demonstration, and Handouts Education comprehension: verbalized understanding, returned demonstration, verbal cues required, and needs further education   HOME EXERCISE PROGRAM: BLE lymphatic pumping there ex using- 1 sets of 10 reps, each exercise in order-  1-2 x daily, bilaterally Simple self MLD 1 x daily Daily skin care to increase hydration, skin mobility and decrease infection risk- can be done during MLD During Intensive Phase CDT: Compression wraps 23/7 until limb volume reduction complete During Self-management Phase CDT: Fit with  appropriate compression garments or alternatives. Consider BLE, knee length, Mediven, custom, CircAid , Velcro style leggings over soft cotton liners.  ASSESSMENT: CLINICAL IMPRESSION: B Leg volumes measured last session reveal some surprises. The L Leg volume, which we have not yet treated, is increased by 10.7% since initially measured on 07/19/23. This is not a systemic fluid retention since  this increase is not bilateral. We know of no possible precipitating event, other than  strengthening in PT may have increased muscle mass. It's doubtful, however, that it's increased by 10% in such a short time. The R leg appears stable, but has only had a 0.67 % reduction change in volume since last measured. Overall the R leg volume is decreased by 2.5%. The visual presentation of the R leg volume being significantly more reduced in volume is misleading, as the difference lies mostly in the increase in the L leg volume by comparison.  We exchanged the CircAid Juxtafit SHORT Velcro leggings for a  LONG and these fit much better. The majority of our visit today was spent teaching Pt how don and gauge the compression on them to 30 mmHg using the card provided. The devices fit well;l, but Pt has a difficult time reaching straps at the ankles . With practice at home I am hopeful that she can don and doff these alternatives to stockings with modified assistance (extra time).   Please refer to GOALS section for additional details on progress towards OT goals to date. We applied wraps to L Leg this session using gradient techniques established for the R LEG. Next session we'll commenced CDT to LLE.   (INITIAL EVAL 07/12/23: Judith Arnold is a 73 yo female presenting with chronic, progressive, BLE swelling 2/2 unknown etiology, which at first glance appears to be circulatory in nature due to skin coloring at distal legs where swelling is most invested. Pt is presently unable to wear traditional  elastic compression  garments due to difficulty donning and doffing them.  Chronic, progressive lymphedema with associated skin changes, including fibrosis, limits this patent's functional performance in all occupational domains, including functional ambulation and mobility, basic and instrumental ADLs (lower body dressing, LB bathing, fitting street shoes and LB clothing, driving, shopping, and home management. It also limits perform productive activities, leisure pursuits, and participation in social and community activities. BLE lymphedema contributes to elevated infection risk. Pt will benefit from skilled OT for Complete Decongestive Therapy (CDT), which  typically includes manual lymphatic drainage (MLD), skin care to limit' infection risk and increase skin excursion, lymphatic pumping exercise, and during the Intensive Phase multilayer, gradient compression bandaging to reduce limb volume. Once limb volume is reduced as much as possible, Pt is fitted with appropriate compression garments and/ or devices and transitions into the self management phase of care consisting of follow long support PRN.    Pt understands that her fair prognosis will become poor without daily assistance with compression wrapping since she is unable to reach feet and legs to apply them herself. Pt assures OT that she has a friend she believes is willing to assist her between OT sessions. )   OBJECTIVE IMPAIRMENTS: decreased standing and walking tolerance, activity tolerance, decreased balance, decreased knowledge of condition, decreased knowledge of use of DME, decreased mobility, decreased ROM, decreased strength, increased edema, impaired flexibility, impaired sensation, impaired UE functional use, impaired vision/reception, pain, and chronic, progressive, BLE swelling and associated skin changes, at increased infection risk   ACTIVITY LIMITATIONS: Functional ambulation and mobility (lifting, carrying, steps/stairs, transfers, squatting) , Basic  and instrumental ADLs, leisure pursuits, productive activities, social participation  PARTICIPATION LIMITATIONS: meal prep, cleaning, laundry, driving, shopping, yard work, and    PERSONAL FACTORS: Age, Fitness, Past/current experiences, Time since onset of injury/illness/exacerbation, 2+ co morbidities: OSA, HTN,  are also affecting patient's functional outcome.   REHAB POTENTIAL: Good  EVALUATION COMPLEXITY: Moderate   GOALS: Goals reviewed with patient? Yes  SHORT TERM GOALS: Target date: 4th OT Rx visit   Pt will demonstrate understanding of lymphedema precautions and prevention strategies with modified independence using a printed reference to identify at least 5 precautions and discussing how s/he may implement them into daily life to reduce risk of progression with extra time. Baseline:Max A Goal status: GOAL MET  2.  Pt will be able to apply multilayer, knee length, gradient, compression wraps to one leg at a time from toes to below knee with max caregiver assist to decrease limb volume, to limit infection risk, and to limit lymphedema progression.  Baseline: Dependent Goal status: GOAL MET  LONG TERM GOALS: Target date: 09/19/23  1.Given this patient's Intake score of TBA % on the Lymphedema Life Impact Scale (LLIS), patient will experience a reduction of at least 5 points in her perceived level of functional impairment resulting from lymphedema to improve functional performance and quality of life (QOL). Baseline: TBA % Goal status:  GOAL DEFERRED  2.  Pt will achieve at least a 10% volume reduction in B legs to return limb to typical size and shape, to limit infection risk and LE progression, to decrease pain, to improve function. Baseline: Dependent Goal status: PROGRESSING. R LEG volume reduced by 14.7% since initially measured on 07/18/21  3.  Pt will obtain appropriate compression garments/devices and achieve modified independence (extra time + assistive devices) with  donning/doffing to optimize limb volume reductions and limit LE progression over time. Baseline: Dependent Goal status: PROGRESSING. RLE  garment on order  During Intensive phase CDT, with max CG assistance, Pt will achieve at least 85% compliance with all adapted lymphedema self-care home program components, including daily skin care, compression wraps and /or garments, simple self MLD and lymphatic pumping therex to habituate LE self care protocol  into ADLs for optimal LE self-management over time. Baseline: Dependent Goal status: GOAL MET  PLAN:  OT FREQUENCY: 2 x/week  OT DURATION: 12 weeks  PLANNED INTERVENTIONS:compression bandaging, skin care,  97110-Therapeutic exercises, 97530- Therapeutic activity, 97535- Self Care, Manual lymph drainage, DME instructions, and fit with appropriate compression  PLAN FOR NEXT SESSION:  Measure R leg to assess progress towards a stocking, or some alternative Cont Pt edu for LE self-care   Zebedee Dec, MS, OTR/L, CLT-LANA 11/15/23 1:23 PM

## 2023-11-16 ENCOUNTER — Ambulatory Visit

## 2023-11-16 ENCOUNTER — Ambulatory Visit: Admitting: Occupational Therapy

## 2023-11-16 DIAGNOSIS — R262 Difficulty in walking, not elsewhere classified: Secondary | ICD-10-CM

## 2023-11-16 DIAGNOSIS — M6281 Muscle weakness (generalized): Secondary | ICD-10-CM | POA: Diagnosis not present

## 2023-11-16 DIAGNOSIS — M5459 Other low back pain: Secondary | ICD-10-CM

## 2023-11-16 DIAGNOSIS — I890 Lymphedema, not elsewhere classified: Secondary | ICD-10-CM

## 2023-11-16 DIAGNOSIS — R278 Other lack of coordination: Secondary | ICD-10-CM

## 2023-11-16 DIAGNOSIS — M25551 Pain in right hip: Secondary | ICD-10-CM

## 2023-11-16 NOTE — Therapy (Signed)
 OUTPATIENT PHYSICAL THERAPY THORACOLUMBAR TREATMENT   Patient Name: BRITNEY NEWSTROM MRN: [id number] DOB:[birth year], 73 y.o., female Today's Date: 11/17/2023  END OF SESSION:  PT End of Session - 11/16/23 1619     Visit Number 13    Number of Visits 25    Date for PT Re-Evaluation 12/12/23    Progress Note Due on Visit 20    PT Start Time 1615    PT Stop Time 1700    PT Time Calculation (min) 45 min    Equipment Utilized During Treatment Gait belt    Activity Tolerance Patient tolerated treatment well;Patient limited by pain    Behavior During Therapy WFL for tasks assessed/performed                  Past Medical History:  Diagnosis Date   Allergy    Arthritis    lower back, left shoulder   Asthma    Carpal tunnel syndrome on both sides    Dental bridge present    top   Dental crown present    multiple   Family history of adverse reaction to anesthesia    Father and sister - PONV   GERD (gastroesophageal reflux disease)    Glaucoma    Hypertension    Leaky heart valve    Pre-diabetes    Sleep apnea    Past Surgical History:  Procedure Laterality Date   CATARACT EXTRACTION W/PHACO Right 11/16/2016   Procedure: CATARACT EXTRACTION PHACO AND INTRAOCULAR LENS PLACEMENT (IOC)  right;  Surgeon: Mittie Gaskin, MD;  Location: [facility];  Service: Ophthalmology;  Laterality: Right;  pre diabetic latex sensitivity sleep apnea   CATARACT EXTRACTION W/PHACO Left 01/18/2017   Procedure: CATARACT EXTRACTION PHACO AND INTRAOCULAR LENS PLACEMENT (IOC) LEFT DIABETIC;  Surgeon: Mittie Gaskin, MD;  Location: [facility];  Service: Ophthalmology;  Laterality: Left;   COLONOSCOPY     COLONOSCOPY WITH PROPOFOL  N/A 04/09/2018   Procedure: COLONOSCOPY WITH PROPOFOL ;  Surgeon: Viktoria Lamar DASEN, MD;  Location: [facility];  Service: Endoscopy;  Laterality: N/A;   IRIDOTOMY / IRIDECTOMY Bilateral 2005   TOOTH EXTRACTION     Patient Active Problem  List   Diagnosis Date Noted   Chronic bilateral low back pain with right-sided sciatica 09/29/2023   Hypertension associated with diabetes (HCC) 01/20/2023   Combined hyperlipidemia associated with type 2 diabetes mellitus (HCC) 01/20/2023   Intertrigo 11/01/2022   Stasis dermatitis of both legs 10/21/2022   Leg swelling 10/21/2022   Lymphedema of lower extremity 10/21/2022   Type 2 diabetes mellitus without complication, without long-term current use of insulin (HCC) 05/24/2022   Mixed hyperlipidemia 05/24/2022   Essential hypertension, benign 05/24/2022   Absolute anemia 05/24/2022   Gastroesophageal reflux disease without esophagitis 05/24/2022   Allergic rhinitis 05/24/2022   Asthma, allergic, moderate persistent, uncomplicated 05/24/2022   Bilateral chronic angle-closure glaucoma, indeterminate stage 05/24/2022   Airway hyperreactivity 08/29/2013   OSA on CPAP 08/29/2013    PCP: Fernand Fredy RAMAN, MD  REFERRING PROVIDER: Fernand Fredy, RAMAN, MD  REFERRING DIAG:  Diagnosis  G89.29,M54.41 (ICD-10-CM) - Chronic bilateral low back pain with right-sided sciatica    Rationale for Evaluation and Treatment: Rehabilitation  THERAPY DIAG:  Other low back pain  Pain in right hip  Muscle weakness (generalized)  Difficulty in walking, not elsewhere classified  Other lack of coordination  ONSET DATE: >3 years   SUBJECTIVE:  SUBJECTIVE STATEMENT:  Today:  Patient reports she was able to put her wrap on her Right LE on by herself today.      Patient reports 3/10 low back pain.   From EVAL: Pt is a pleasant 73 y/o female presenting to PT eval for chronic LBP with R side hip pain. She reports onset of LBP over 3 years ago. Primary site of pain is mostly felt in R hip and lower spine. Pt reports hx  of arthritis in lower back and R hip. She is now starting to feel arthritis pain in her knees as well. Reports RLE is actually her good leg, has some issues with pain into groin region of LLE. Pt states strength in legs has been impacted and that her general mobility is worse. Pt can only ambulate comfortably by taking weight off of lower back by using her 4WW. She states not using 4WW so much for balance, but for reducing pain with gait. She is unable to stand up fully due to pain. Pt says she used to be very fit (took take karate, gymnastics years ago). She is also seeing OT for BLE lymphedema management. She endorses difficulty getting out of chairs, difficulty with steps, she must use a ramp to get in and out of her home. Pt reports my brain does not retain what I'm talking about, can lose train of thought.  PERTINENT HISTORY:  PMH per chart includes arthritis low back and left shoulder, bilateral carpal tunnel, asthma, glaucoma, HTN, leaky heart valve, sleep apnea, pre-diabetes, being seen by lymphedema specialist for BLE lymphedema   PAIN:  Are you having pain? LBP and R hip pain  Lowest pain level: 0/10  With standing: 2/10  Worst: 5-6/10   PRECAUTIONS: LATEX ALLERGY per chart, fall  RED FLAGS: None   WEIGHT BEARING RESTRICTIONS: No  FALLS:  Has patient fallen in last 6 months? No  LIVING ENVIRONMENT: Uses ramp to get into home due to difficulty with steps, has 4WW  PLOF: Independent  PATIENT GOALS:  Says due to back pain she is unable to reach my feet, has difficulty getting her socks on without a grabber, wants to be more flexible and stronger to increase ease with these activities and mobility    OBJECTIVE:  Note: Objective measures were completed at Evaluation unless otherwise noted.  DIAGNOSTIC FINDINGS:  No recent pertinent imaging available in chart  PATIENT SURVEYS:  Modified Oswestry score: 34%  Interpretation of scores: Score Category Description  0-20%  Minimal Disability The patient can cope with most living activities. Usually no treatment is indicated apart from advice on lifting, sitting and exercise  21-40% Moderate Disability The patient experiences more pain and difficulty with sitting, lifting and standing. Travel and social life are more difficult and they may be disabled from work. Personal care, sexual activity and sleeping are not grossly affected, and the patient can usually be managed by conservative means  41-60% Severe Disability Pain remains the main problem in this group, but activities of daily living are affected. These patients require a detailed investigation  61-80% Crippled Back pain impinges on all aspects of the patient's life. Positive intervention is required  81-100% Bed-bound  These patients are either bed-bound or exaggerating their symptoms  Bluford FORBES Zoe DELENA Karon DELENA, et al. Surgery versus conservative management of stable thoracolumbar fracture: the PRESTO feasibility RCT. [place]): [employer]; 2021 Nov. [facility] Technology Assessment, No. 25.62.) Appendix 3, Oswestry Disability Index category descriptors. Available from: FindJewel[employer].cz  Minimally Clinically Important Difference (MCID) = 12.8%  COGNITION: Overall cognitive status: Within functional limits for tasks assessed, very pleasant pt can become tangential and requires redirection to activity    SENSATION: Intact to light touch BLE   MUSCLE LENGTH: deferred  POSTURE: increased thoracic kyphosis, rounded shoulders, unable to achieve full upright position due to pain, maintains increased hip and knee flexion when attempting to stand fully upright   PALPATION: deferred  Thoracolumbar AROM: Extension - unable to achieve neutral position, remains in significant flexion  Flexion - lacking at least 25%  Rotation - lacking at least 40% bilat  and pain-limited    LOWER EXTREMITY MMT:    MMT  Right eval Left eval  Hip flexion 4- 3*  Hip extension    Hip abduction 4+ 4+  Hip adduction 4+ 4+  Hip internal rotation    Hip external rotation    Knee flexion 4 3*  Knee extension 4+ 4+  Ankle dorsiflexion 4 4+  Ankle plantarflexion    Ankle inversion    Ankle eversion     (Blank rows = not tested) *=pain limited   LUMBAR SPECIAL TESTS:  deferred  FUNCTIONAL TESTS:  10 meter walk test: 0.63 m/s crouched posture, decreased hip extension, decreased step-length and heel strike, elevated R>L shoulder, heavy BUE weightbearing on 4WW  GAIT: Distance walked: 10MWT/clinic distances (see above) Assistive device utilized: Environmental consultant - 4 wheeled Level of assistance: Modified independence Comments: gait mechanics impaired: decreased gait speed, crouched posture, decreased hip extension, decreased step-length and heel strike, elevated R>L shoulder, heavy BUE weightbearing on 4WW  TREATMENT DATE: 11/17/23    TE:  Seated hip march 2 x 10  Seated knee ext 2 x 10  Seated lumbar flex Stretch - hold 45 sec x 4  TA:   Ambulation with 4WW x 80 feet x 2 Sit to stand from seat of 4WW with min UE support x 12 reps  Ambulation with 4WW x 175 feet  Forward/bwd step over 1/2 foam roll  x 15  reps  Side stepping up/over 1/2 foam roll x 15 reps each direction (increased time- able to clear feet over obstacle)                                                                                              PATIENT EDUCATION:  Education details: Provided education on findings of exam, indications for plan, prognoses, goals, how PT can help                 Person educated: Patient Education method: Explanation Education comprehension: verbalized understanding  HOME EXERCISE PROGRAM: Access Code: [email or web address] URL: [email or web address] Date: 10/05/2023 Prepared by: Lonni Gainer  Exercises - Supine Bridge  - 3 x weekly - 3 sets - 10 reps - Clamshell  - 3 x weekly - 3 sets - 10  reps - Supine Lower Trunk Rotation  - 1 x daily - 3 sets - 30 sec hold - Seated Hamstring Stretch  - 1 x daily - 3 sets - 30 hold - Seated Clamshell  - 1 x daily - 7 x weekly -  2 sets - 20 reps - 3 sec hold - Supine Transversus Abdominis Bracing - Hands on Stomach  - 3 x weekly - 3 sets - 10 reps - Supine Gluteal Sets  - 3 x weekly - 3 sets - 10 reps  ASSESSMENT:  CLINICAL IMPRESSION: Treatment continued to focus on overall LE strengthening in pain free positions and trying to improve her overall stamina. She was able to perform the step overs better today than previous session and able to increase reps with some therex.  Pt will continue to benefit from skilled physical therapy intervention to address impairments, improve QOL, and attain therapy goals.    OBJECTIVE IMPAIRMENTS: Abnormal gait, decreased activity tolerance, decreased balance, decreased mobility, difficulty walking, decreased strength, hypomobility, increased edema, improper body mechanics, postural dysfunction, and pain.   ACTIVITY LIMITATIONS: carrying, lifting, bending, squatting, stairs, transfers, bed mobility, and locomotion level  PARTICIPATION LIMITATIONS: meal prep, cleaning, shopping, community activity, and yard work  PERSONAL FACTORS: Age, Fitness, Sex, Time since onset of injury/illness/exacerbation, and 3+ comorbidities: PMH per chart includes arthritis low back and left shoulder, bilateral carpal tunnel, asthma, glaucoma, HTN, leaky heart valve, sleep apnea, pre-diabetes, being seen by lymphedema specialist for BLE lymphedema  are also affecting patient's functional outcome.   REHAB POTENTIAL: Good  CLINICAL DECISION MAKING: Evolving/moderate complexity  EVALUATION COMPLEXITY: Moderate   GOALS: Goals reviewed with patient? Yes   SHORT TERM GOALS: Target date: 10/31/2023    Patient will be independent in home exercise program to improve strength/mobility for better functional independence with  ADLs. Baseline:to be initiated; 11/06/2023=Patient reports knowledgeable of HEP but not always able to perform and sometimes painful so has to modify.  Goal status: MET   LONG TERM GOALS: Target date: 12/12/2023    Patient will reduce modified Oswestry score to <20 as to demonstrate minimal disability with ADLs including improved sleeping tolerance, walking/sitting tolerance etc for better mobility with ADLs.  Baseline: 34; 11/06/2023= 32 Goal status: INITIAL  2.  Patient (> 62 years old) will complete five times sit to stand test in < 15 seconds indicating an increased LE strength and improved balance. Baseline: 09/26/2023= 26.46 sec with heavy UE Support; 11/06/2023= 11/06/2023= 22.31 sec without UE Support Goal status: PROGRESSING  3.  Patient will increase Berg Balance score by > 6 points to demonstrate decreased fall risk during functional activities Baseline: 09/26/2023= 31/56; 11/06/2023= 40/56 Goal status: MET  4.  Patient will increase 10 meter walk test to >1.51m/s as to improve gait speed for better community ambulation and to reduce fall risk. Baseline:  0.63 m/s with 4WW; 0.74 m/s Goal status: PROGRESSING      PLAN:  PT FREQUENCY: 1-2x/week  PT DURATION: 12 weeks  PLANNED INTERVENTIONS: [id number]- PT Re-evaluation, 97750- Physical Performance Testing, 97110-Therapeutic exercises, 97530- Therapeutic activity, [id number]- Neuromuscular re-education, 97535- Self Care, [id number]- Manual therapy, [id number]- Gait training, [phone]- Orthotic Initial, [phone]- Orthotic/Prosthetic subsequent, [phone]- Canalith repositioning, Patient/Family education, Balance training, Stair training, Taping, Joint mobilization, Spinal mobilization, Vestibular training, DME instructions, Cryotherapy, and Moist heat.  PLAN FOR NEXT SESSION:   Continue to progress HEP Manual therapy for lumbar ROM/pain Activities for ROM, Low back/LE/Core strengthening Continue to progress functional mobility and balance.     Reyes LOISE London, PT 11/17/2023, 11:44 AM

## 2023-11-17 NOTE — Therapy (Signed)
 OUTPATIENT OCCUPATIONAL THERAPY TREATMENT NOTE   BILATERAL LOWER EXTREMITY LYMPHEDEMA  Patient Name: Judith Arnold MRN: [id number] DOB:Feb 07, 1951, 73 y.o., female Today's Date: 11/17/2023  REPORTING PERIOD: END OF SESSION:   OT End of Session - 11/16/23 0839     Visit Number 31    Number of Visits 36    Date for OT Re-Evaluation 01/16/24    OT Start Time 0305    OT Stop Time 0406    OT Time Calculation (min) 61 min    Equipment Utilized During Treatment sock donner; compression garment samples    Activity Tolerance Patient tolerated treatment well;No increased pain    Behavior During Therapy WFL for tasks assessed/performed           Past Medical History:  Diagnosis Date   Allergy    Arthritis    lower back, left shoulder   Asthma    Carpal tunnel syndrome on both sides    Dental bridge present    top   Dental crown present    multiple   Family history of adverse reaction to anesthesia    Father and sister - PONV   GERD (gastroesophageal reflux disease)    Glaucoma    Hypertension    Leaky heart valve    Pre-diabetes    Sleep apnea    Past Surgical History:  Procedure Laterality Date   CATARACT EXTRACTION W/PHACO Right 11/16/2016   Procedure: CATARACT EXTRACTION PHACO AND INTRAOCULAR LENS PLACEMENT (IOC)  right;  Surgeon: Mittie Gaskin, MD;  Location: [facility];  Service: Ophthalmology;  Laterality: Right;  pre diabetic latex sensitivity sleep apnea   CATARACT EXTRACTION W/PHACO Left 01/18/2017   Procedure: CATARACT EXTRACTION PHACO AND INTRAOCULAR LENS PLACEMENT (IOC) LEFT DIABETIC;  Surgeon: Mittie Gaskin, MD;  Location: [facility];  Service: Ophthalmology;  Laterality: Left;   COLONOSCOPY     COLONOSCOPY WITH PROPOFOL  N/A 04/09/2018   Procedure: COLONOSCOPY WITH PROPOFOL ;  Surgeon: Viktoria Lamar DASEN, MD;  Location: [facility];  Service: Endoscopy;  Laterality: N/A;   IRIDOTOMY / IRIDECTOMY Bilateral 2005   TOOTH  EXTRACTION     Patient Active Problem List   Diagnosis Date Noted   Chronic bilateral low back pain with right-sided sciatica 09/29/2023   Hypertension associated with diabetes (HCC) 01/20/2023   Combined hyperlipidemia associated with type 2 diabetes mellitus (HCC) 01/20/2023   Intertrigo 11/01/2022   Stasis dermatitis of both legs 10/21/2022   Leg swelling 10/21/2022   Lymphedema of lower extremity 10/21/2022   Type 2 diabetes mellitus without complication, without long-term current use of insulin (HCC) 05/24/2022   Mixed hyperlipidemia 05/24/2022   Essential hypertension, benign 05/24/2022   Absolute anemia 05/24/2022   Gastroesophageal reflux disease without esophagitis 05/24/2022   Allergic rhinitis 05/24/2022   Asthma, allergic, moderate persistent, uncomplicated 05/24/2022   Bilateral chronic angle-closure glaucoma, indeterminate stage 05/24/2022   Airway hyperreactivity 08/29/2013   OSA on CPAP 08/29/2013    PCP: Fredy GORMAN Bathe, MD  REFERRING PROVIDER: same  REFERRING DIAG: lymphedema I89.0  THERAPY DIAG:  Lymphedema, not elsewhere classified  Rationale for Evaluation and Treatment: Rehabilitation  ONSET DATE: Pt reports onset of L ankle / leg swelling after injury 20-30 yrs ago. Then about 1 year ago legs started swelling and Pt having prickly sensation on shins and thighs bilaterally.   SUBJECTIVE:  SUBJECTIVE STATEMENT: Judith Arnold presents to OT for treatment of BLE lymphedema 2/2 suspected CVI and obesity. Pt is unaccompanied today. Pt reports the friend who has been helping her with wraps between visits has become somewhat unreliable. My leg has been unwrapped for 2 days. Pt reports she attempted to don the new CircAid legging between visits, but she was unable to reach her  lower leg and foot to do so.   (INITIAL EVAL 07/12/23 : Judith Arnold is referred to Occupational Therapy by Fredy GORMAN Bathe, MD,  for evaluation and treatment of BLE lymphedema.  Pt reports onset of L ankle and distal leg swelling about 30 years ago, then noticed RLE swelling and worsening LLE swelling about 1 year ago at same time she started having unusual sensation in legs. Pt denies previously having lymphedema treatment. Pt reports paternal aunt had leg swelling. Pt is unable to wear compression stockings because she is unable to reach feet and distal legs to don and doff them due to back and hip pain.  PERTINENT HISTORY: Relevant to lymphedema (OSA (denies using CPAP), BLE stasis dermatitis, HTN, DM 2 (Pt states she is prediabetic only), B glaucoma, Back and R hip OA  PAIN:  Are you having LE-related pain? Yes: NPRS scale: 0/10 Pain location: medial proximal thighs w abduction Pain description: like stickers Aggravating factors: standing, walking, dependent sitting Relieving factors: elevation  PRECAUTIONS: Fall and Other: LYMPHEDEMA precautions  (DM 2 and asthma)  WEIGHT BEARING RESTRICTIONS: No  FALLS:  Has patient fallen in last 6 months? yes Fell asleep at the computer and fell onto the floor [place] when storm door hit me  LIVING ENVIRONMENT: Lives with: alone Lives in: House/apartment Stairs: No;  Has following equipment at home: Environmental consultant - 4 wheeled  OCCUPATION: retired Scientist, research (life sciences)  LEISURE: reading and taking notes on current events and politics  HAND DOMINANCE: right   PRIOR LEVEL OF FUNCTION: Independent with household mobility with device, Requires assistive device for independence, Needs assistance with ADLs, Needs assistance with homemaking, and Needs assistance with gait  PATIENT GOALS: reduce swelling and keep it from getting worse  OBJECTIVE: Note: Objective measures were completed at Evaluation unless otherwise  noted.  COGNITION:  Overall cognitive status: impaired short term  memory   OBSERVATIONS / OTHER ASSESSMENTS: Mild, Stage II, BLE Lymphedema 2/2 suspected venous insufficiency.   SENSATION: Reports uncomfortable scratching, or sticker-like  sensation on anterior thighs   POSTURE: Raised walker handles 2 notches to increase upright spinal alignment when walking to limit falls risk. Handles need to be raised at least 1 hole more for safe upright posture when walking.  Upright sitting posture: head forward , shoulders forward and rounded- appears to be flexible kyphosis  LE ROM: WFL for ankles and knees. Pt reports limited hip abduction )  LE MMT: WFL FOR LYMPHEDEMA CARE. Presenting with generalized weakness and Pt reports debility  LYMPHEDEMA ASSESSMENTS:   SURGERY TYPE/DATE: Non-cancer related limb swelling  HX INFECTIONS: positive for 1 episode cellulitis treated w antibiotics  Hx WOUNDS: denies   BLE COMPARATIVE LIMB VOLUMETRICS: INITIAL 07/19/23  LANDMARK RIGHT (dominant)  R LEG (A-D) 3525.1 ml  R THIGH (E-G) ml  R FULL LIMB (A-G) ml  Limb Volume differential (LVD)  %  Volume change since initial %  Volume change overall V  (Blank rows = not tested)  LANDMARK LEFT    L LEG (A-D) 3769.6 ml  L THIGH (E-G) ml  L FULL LIMB (A-G) ml  Limb Volume differential (LVD)  Limb Volume Differential (LVD) measures 6.5%, L>R.  Volume change since initial %  Volume change overall %    RLE COMPARATIVE LIMB VOLUMETRICS: VISIT 9 08/15/23  LANDMARK RIGHT (dominant)  R LEG (A-D) 3549.4 ml  R THIGH (E-G) ml  R FULL LIMB (A-G) ml  Limb Volume differential (LVD)  %  Volume change since initial R LEG volume reduced by 5.84% since initially measured on 07/18/21  Volume change overall V  (Blank rows = not tested)  RLE COMPARATIVE LIMB VOLUMETRICS: VISIT 19 10/03/23  LANDMARK RIGHT (dominant)  R LEG (A-D) 3549.4 ml  R THIGH (E-G) ml  R FULL LIMB (A-G) ml  Limb Volume differential (LVD)   %  Volume change since initial R LEG volume reduced by 14.7% since initially measured on 07/18/21  Volume change overall V    BLE COMPARATIVE LIMB VOLUMETRICS: 29th Visit  LANDMARK RIGHT (dominant)  R LEG (A-D) 3438.1 ml  R THIGH (E-G) ml  R FULL LIMB (A-G) ml  Limb Volume differential (LVD)  %  Volume change since last measured on 10/03/23 (19th visit) DECREASED by 0.67%. R Leg limb volume stable.  Volume change overall Overall R Leg volume reduction = 2.5%. Goal not me to date  (Blank rows = not tested)  LANDMARK LEFT    L LEG (A-D) 4174.4  ml  L THIGH (E-G) ml  L FULL LIMB (A-G) ml  Limb Volume differential (LVD)  .  Volume change since initial on 07/19/23 L LEG volume INCREASED by 10.7% since initially measured on 07/19/23  Volume change overall L LEG volume INCREASED by 10.7& since initially measured on 07/19/23  (Blank rows = not tested)  Mild, Stage  II, Bilateral Lower Extremity Lymphedema 2/2 CVI and Obesity  Skin  Description Hyper-Keratosis Peau' de Orange Shiny Tight Fibrotic/ Indurated Fatty Doughy Spongy/ boggy       R>L x  x   Skin dry Flaky WNL Macerated   mildly      Color Redness Varicosities Blanching Hemosiderin Stain Mottled   x     x   Odor Malodorous Yeast Fungal infection  WNL      x   Temperature Warm Cool wnl    x     Pitting Edema   1+ 2+ 3+ 4+ Non-pitting         x   Girth Symmetrical Asymmetrical                   Distribution    R>L toes to groin    Stemmer Sign Positive Negative   +    Lymphorrhea History Of:  Present Absent     x    Wounds History Of Present Absent Venous Arterial Pressure Sheer     x        Signs of Infection Redness Warmth Erythema Acute Swelling Drainage Borders                    Sensation Light Touch Deep pressure Hypersensitivity   Present Impaired Present Impaired Absent Impaired   x Tactile  x  x     Nails WNL   Fungus nail dystrophy   x     Hair Growth Symmetrical Asymmetrical   x     Skin Creases Base of toes  Ankles   Base of Fingers knees       Abdominal pannus Thigh Lobules  Face/neck   x x  x      (  Blank rows = not tested)    LYMPHEDEMA LIFE IMPACT SCALE (LLIS): TBA OT Rx visit 1  TREATMENT DATE:  Pt edu for lymphedema progress towards goals and self-care   PATIENT EDUCATION:  Continued Pt/ CG edu for lymphedema self care home program throughout session. Topics include outcome of comparative limb volumetrics- starting limb volume differentials (LVDs), technology and gradient techniques used for short stretch, multilayer compression wrapping, simple self-MLD, therapeutic lymphatic pumping exercises, skin/nail care, LE precautions, compression garment recommendations and specifications, wear and care schedule and compression garment donning / doffing w assistive devices. Discussed progress towards all OT goals since commencing CDT. Discussed detrimental impact of obesity on lower and upper extremity lymphedema over time. Reviewed OT goals for lymphedema care with Pt and discussed progress to date.  All questions answered to the Pt's satisfaction. Good return. Person educated: Patient  Education method: Explanation, Demonstration, and Handouts Education comprehension: verbalized understanding, returned demonstration, verbal cues required, and needs further education   HOME EXERCISE PROGRAM: BLE lymphatic pumping there ex using- 1 sets of 10 reps, each exercise in order-  1-2 x daily, bilaterally Simple self MLD 1 x daily Daily skin care to increase hydration, skin mobility and decrease infection risk- can be done during MLD During Intensive Phase CDT: Compression wraps 23/7 until limb volume reduction complete During Self-management Phase CDT: Fit with appropriate compression garments or alternatives. Consider BLE, knee length, Mediven, custom, CircAid , Velcro style leggings over soft cotton liners.  ASSESSMENT: CLINICAL IMPRESSION: Today we continued teaching  Pt how don and gauge the adjustable, CircAid, compression , knee length, legging. After multiple attempts and frequent repositioning sitting on the side of our firm, stable, Rx bed, positioning the R foot and leg on a footstool, and using a reacher Pt is able to position CircAid garment in correct posit on the R leg where she is able to reach it to gauge the compression tabs to achieve 30-40 mmHg on her lowe leg from ankle to popliteal fossa. This took out full hour long session, but with practice I predict Pt will be able to reduce time            dramatically. Cont teaching this skill until Pt feels confident that she is able to complete this task effectively and in a timely way. We applied compression wraps to the LLE today below the knee. Cont as per POC.   (INITIAL EVAL 07/12/23: Judith Arnold is a 73 yo female presenting with chronic, progressive, BLE swelling 2/2 unknown etiology, which at first glance appears to be circulatory in nature due to skin coloring at distal legs where swelling is most invested. Pt is presently unable to wear traditional elastic compression garments due to difficulty donning and doffing them.  Chronic, progressive lymphedema with associated skin changes, including fibrosis, limits this patent's functional performance in all occupational domains, including functional ambulation and mobility, basic and instrumental ADLs (lower body dressing, LB bathing, fitting street shoes and LB clothing, driving, shopping, and home management. It also limits perform productive activities, leisure pursuits, and participation in social and community activities. BLE lymphedema contributes to elevated infection risk. Pt will benefit from skilled OT for Complete Decongestive Therapy (CDT), which  typically includes manual lymphatic drainage (MLD), skin care to limit' infection risk and increase skin excursion, lymphatic pumping exercise, and during the Intensive Phase multilayer, gradient  compression bandaging to reduce limb volume. Once limb volume is reduced as much as possible, Pt is fitted with appropriate compression garments  and/ or devices and transitions into the self management phase of care consisting of follow long support PRN.    Pt understands that her fair prognosis will become poor without daily assistance with compression wrapping since she is unable to reach feet and legs to apply them herself. Pt assures OT that she has a friend she believes is willing to assist her between OT sessions. )   OBJECTIVE IMPAIRMENTS: decreased standing and walking tolerance, activity tolerance, decreased balance, decreased knowledge of condition, decreased knowledge of use of DME, decreased mobility, decreased ROM, decreased strength, increased edema, impaired flexibility, impaired sensation, impaired UE functional use, impaired vision/reception, pain, and chronic, progressive, BLE swelling and associated skin changes, at increased infection risk   ACTIVITY LIMITATIONS: Functional ambulation and mobility (lifting, carrying, steps/stairs, transfers, squatting) , Basic and instrumental ADLs, leisure pursuits, productive activities, social participation  PARTICIPATION LIMITATIONS: meal prep, cleaning, laundry, driving, shopping, yard work, and    PERSONAL FACTORS: Age, Fitness, Past/current experiences, Time since onset of injury/illness/exacerbation, 2+ co morbidities: OSA, HTN,  are also affecting patient's functional outcome.   REHAB POTENTIAL: Good  EVALUATION COMPLEXITY: Moderate   GOALS: Goals reviewed with patient? Yes  SHORT TERM GOALS: Target date: 4th OT Rx visit   Pt will demonstrate understanding of lymphedema precautions and prevention strategies with modified independence using a printed reference to identify at least 5 precautions and discussing how s/he may implement them into daily life to reduce risk of progression with extra time. Baseline:Max A Goal status: GOAL  MET  2.  Pt will be able to apply multilayer, knee length, gradient, compression wraps to one leg at a time from toes to below knee with max caregiver assist to decrease limb volume, to limit infection risk, and to limit lymphedema progression.  Baseline: Dependent Goal status: GOAL MET  LONG TERM GOALS: Target date: 09/19/23  1.Given this patient's Intake score of TBA % on the Lymphedema Life Impact Scale (LLIS), patient will experience a reduction of at least 5 points in her perceived level of functional impairment resulting from lymphedema to improve functional performance and quality of life (QOL). Baseline: TBA % Goal status:  GOAL DEFERRED  2.  Pt will achieve at least a 10% volume reduction in B legs to return limb to typical size and shape, to limit infection risk and LE progression, to decrease pain, to improve function. Baseline: Dependent Goal status: PROGRESSING. R LEG volume reduced by 14.7% since initially measured on 07/18/21  3.  Pt will obtain appropriate compression garments/devices and achieve modified independence (extra time + assistive devices) with donning/doffing to optimize limb volume reductions and limit LE progression over time. Baseline: Dependent Goal status: PROGRESSING. RLE garment on order  During Intensive phase CDT, with max CG assistance, Pt will achieve at least 85% compliance with all adapted lymphedema self-care home program components, including daily skin care, compression wraps and /or garments, simple self MLD and lymphatic pumping therex to habituate LE self care protocol  into ADLs for optimal LE self-management over time. Baseline: Dependent Goal status: GOAL MET  PLAN:  OT FREQUENCY: 2 x/week  OT DURATION: 12 weeks  PLANNED INTERVENTIONS:compression bandaging, skin care,  97110-Therapeutic exercises, 97530- Therapeutic activity, 97535- Self Care, Manual lymph drainage, DME instructions, and fit with appropriate compression  PLAN FOR NEXT  SESSION:  Measure R leg to assess progress towards a stocking, or some alternative Cont Pt edu for LE self-care   Zebedee Dec, MS, OTR/L, CLT-LANA 11/17/23 8:45 AM

## 2023-11-20 ENCOUNTER — Ambulatory Visit: Admitting: Occupational Therapy

## 2023-11-20 ENCOUNTER — Ambulatory Visit

## 2023-11-20 DIAGNOSIS — R262 Difficulty in walking, not elsewhere classified: Secondary | ICD-10-CM

## 2023-11-20 DIAGNOSIS — I890 Lymphedema, not elsewhere classified: Secondary | ICD-10-CM

## 2023-11-20 DIAGNOSIS — M6281 Muscle weakness (generalized): Secondary | ICD-10-CM

## 2023-11-20 DIAGNOSIS — M25551 Pain in right hip: Secondary | ICD-10-CM | POA: Diagnosis not present

## 2023-11-20 DIAGNOSIS — M5459 Other low back pain: Secondary | ICD-10-CM

## 2023-11-20 DIAGNOSIS — R278 Other lack of coordination: Secondary | ICD-10-CM | POA: Diagnosis not present

## 2023-11-20 NOTE — Therapy (Signed)
 OUTPATIENT OCCUPATIONAL THERAPY TREATMENT NOTE   BILATERAL LOWER EXTREMITY LYMPHEDEMA  Patient Name: Judith Arnold MRN: [id number] DOB:1950/09/22, 73 y.o., female Today's Date: 11/20/2023  REPORTING PERIOD: END OF SESSION:   OT End of Session - 11/20/23 0910     Visit Number 32    Number of Visits 36    Date for OT Re-Evaluation 01/16/24    OT Start Time 0900    OT Stop Time 1006    OT Time Calculation (min) 66 min    Equipment Utilized During Treatment sock donner; footstool    Activity Tolerance Patient tolerated treatment well;No increased pain    Behavior During Therapy WFL for tasks assessed/performed           Past Medical History:  Diagnosis Date   Allergy    Arthritis    lower back, left shoulder   Asthma    Carpal tunnel syndrome on both sides    Dental bridge present    top   Dental crown present    multiple   Family history of adverse reaction to anesthesia    Father and sister - PONV   GERD (gastroesophageal reflux disease)    Glaucoma    Hypertension    Leaky heart valve    Pre-diabetes    Sleep apnea    Past Surgical History:  Procedure Laterality Date   CATARACT EXTRACTION W/PHACO Right 11/16/2016   Procedure: CATARACT EXTRACTION PHACO AND INTRAOCULAR LENS PLACEMENT (IOC)  right;  Surgeon: Mittie Gaskin, MD;  Location: [facility];  Service: Ophthalmology;  Laterality: Right;  pre diabetic latex sensitivity sleep apnea   CATARACT EXTRACTION W/PHACO Left 01/18/2017   Procedure: CATARACT EXTRACTION PHACO AND INTRAOCULAR LENS PLACEMENT (IOC) LEFT DIABETIC;  Surgeon: Mittie Gaskin, MD;  Location: [facility];  Service: Ophthalmology;  Laterality: Left;   COLONOSCOPY     COLONOSCOPY WITH PROPOFOL  N/A 04/09/2018   Procedure: COLONOSCOPY WITH PROPOFOL ;  Surgeon: Viktoria Lamar DASEN, MD;  Location: [facility];  Service: Endoscopy;  Laterality: N/A;   IRIDOTOMY / IRIDECTOMY Bilateral 2005   TOOTH EXTRACTION     Patient  Active Problem List   Diagnosis Date Noted   Chronic bilateral low back pain with right-sided sciatica 09/29/2023   Hypertension associated with diabetes (HCC) 01/20/2023   Combined hyperlipidemia associated with type 2 diabetes mellitus (HCC) 01/20/2023   Intertrigo 11/01/2022   Stasis dermatitis of both legs 10/21/2022   Leg swelling 10/21/2022   Lymphedema of lower extremity 10/21/2022   Type 2 diabetes mellitus without complication, without long-term current use of insulin (HCC) 05/24/2022   Mixed hyperlipidemia 05/24/2022   Essential hypertension, benign 05/24/2022   Absolute anemia 05/24/2022   Gastroesophageal reflux disease without esophagitis 05/24/2022   Allergic rhinitis 05/24/2022   Asthma, allergic, moderate persistent, uncomplicated 05/24/2022   Bilateral chronic angle-closure glaucoma, indeterminate stage 05/24/2022   Airway hyperreactivity 08/29/2013   OSA on CPAP 08/29/2013    PCP: Fredy GORMAN Bathe, MD  REFERRING PROVIDER: same  REFERRING DIAG: lymphedema I89.0  THERAPY DIAG:  Lymphedema, not elsewhere classified  Rationale for Evaluation and Treatment: Rehabilitation  ONSET DATE: Pt reports onset of L ankle / leg swelling after injury 20-30 yrs ago. Then about 1 year ago legs started swelling and Pt having prickly sensation on shins and thighs bilaterally.   SUBJECTIVE:  SUBJECTIVE STATEMENT: Judith Arnold presents to OT for treatment of BLE lymphedema 2/2 suspected CVI and obesity. Pt is unaccompanied today. Pt reports the friend who has been helping her with wraps between visits has become somewhat unreliable.  Pt reports her friend wrapped her L Leg once between visits. She reports she WAS ABLE TO FIND A COUPLE OF FOOT STOOL OPTIONS AT HOME THAT WERE HELPFUL FOR DONNING HER R  Circaid. I was able to get it on, but it took a while, and my helper gauged it for me.  (INITIAL EVAL 07/12/23 : Judith Arnold is referred to Occupational Therapy by Fredy GORMAN Bathe, MD,  for evaluation and treatment of BLE lymphedema.  Pt reports onset of L ankle and distal leg swelling about 30 years ago, then noticed RLE swelling and worsening LLE swelling about 1 year ago at same time she started having unusual sensation in legs. Pt denies previously having lymphedema treatment. Pt reports paternal aunt had leg swelling. Pt is unable to wear compression stockings because she is unable to reach feet and distal legs to don and doff them due to back and hip pain.  PERTINENT HISTORY: Relevant to lymphedema (OSA (denies using CPAP), BLE stasis dermatitis, HTN, DM 2 (Pt states she is prediabetic only), B glaucoma, Back and R hip OA  PAIN:  Are you having LE-related pain? Yes: NPRS scale: 0/10 Pain location: medial proximal thighs w abduction, bilateral groin, buttocks Pain description: like stickers Aggravating factors: standing, walking, dependent sitting Relieving factors: elevation  PRECAUTIONS: Fall and Other: LYMPHEDEMA precautions  (DM 2 and asthma)  WEIGHT BEARING RESTRICTIONS: No  FALLS:  Has patient fallen in last 6 months? yes Fell asleep at the computer and fell onto the floor [place] when storm door hit me  LIVING ENVIRONMENT: Lives with: alone Lives in: House/apartment Stairs: No;  Has following equipment at home: Environmental consultant - 4 wheeled  OCCUPATION: retired Scientist, research (life sciences)  LEISURE: reading and taking notes on current events and politics  HAND DOMINANCE: right   PRIOR LEVEL OF FUNCTION: Independent with household mobility with device, Requires assistive device for independence, Needs assistance with ADLs, Needs assistance with homemaking, and Needs assistance with gait  PATIENT GOALS: reduce swelling and keep it from getting worse  OBJECTIVE: Note:  Objective measures were completed at Evaluation unless otherwise noted.  COGNITION:  Overall cognitive status: impaired short term  memory   OBSERVATIONS / OTHER ASSESSMENTS: Mild, Stage II, BLE Lymphedema 2/2 suspected venous insufficiency and hx of obesity   SENSATION: Reports uncomfortable scratching, or sticker-like  sensation on anterior thighs   POSTURE: Raised walker handles 2 notches to increase upright spinal alignment when walking to limit falls risk. Handles need to be raised at least 1 hole more for safe upright posture when walking.  Upright sitting posture: head forward , shoulders forward and rounded- appears to be flexible kyphosis  LE ROM: WFL for ankles and knees. Pt reports limited hip abduction  LE MMT: [facility] FOR LYMPHEDEMA CARE. Presenting with generalized weakness and debility  LYMPHEDEMA ASSESSMENTS:   SURGERY TYPE/DATE: Non-cancer related limb swelling  HX INFECTIONS: positive for 1 episode cellulitis treated w antibiotics  Hx WOUNDS: denies   BLE COMPARATIVE LIMB VOLUMETRICS: INITIAL 07/19/23  LANDMARK RIGHT (dominant)  R LEG (A-D) 3525.1 ml  R THIGH (E-G) ml  R FULL LIMB (A-G) ml  Limb Volume differential (LVD)  %  Volume change since initial %  Volume change overall V  (Blank rows = not tested)  LANDMARK LEFT    L LEG (A-D) 3769.6 ml  L THIGH (E-G) ml  L FULL LIMB (A-G) ml  Limb Volume differential (LVD)  Limb Volume Differential (LVD) measures 6.5%, L>R.  Volume change since initial %  Volume change overall %    RLE COMPARATIVE LIMB VOLUMETRICS: VISIT 9 08/15/23  LANDMARK RIGHT (dominant)  R LEG (A-D) 3549.4 ml  R THIGH (E-G) ml  R FULL LIMB (A-G) ml  Limb Volume differential (LVD)  %  Volume change since initial R LEG volume reduced by 5.84% since initially measured on 07/18/21  Volume change overall V  (Blank rows = not tested)  RLE COMPARATIVE LIMB VOLUMETRICS: VISIT 19 10/03/23  LANDMARK RIGHT (dominant)  R LEG (A-D) 3549.4 ml  R  THIGH (E-G) ml  R FULL LIMB (A-G) ml  Limb Volume differential (LVD)  %  Volume change since initial R LEG volume reduced by 14.7% since initially measured on 07/18/21  Volume change overall V    BLE COMPARATIVE LIMB VOLUMETRICS: 29th Visit  LANDMARK RIGHT (dominant)  R LEG (A-D) 3438.1 ml  R THIGH (E-G) ml  R FULL LIMB (A-G) ml  Limb Volume differential (LVD)  %  Volume change since last measured on 10/03/23 (19th visit) DECREASED by 0.67%. R Leg limb volume stable.  Volume change overall Overall R Leg volume reduction = 2.5%. Goal not me to date  (Blank rows = not tested)  LANDMARK LEFT    L LEG (A-D) 4174.4  ml  L THIGH (E-G) ml  L FULL LIMB (A-G) ml  Limb Volume differential (LVD)  .  Volume change since initial on 07/19/23 L LEG volume INCREASED by 10.7% since initially measured on 07/19/23  Volume change overall L LEG volume INCREASED by 10.7& since initially measured on 07/19/23  (Blank rows = not tested)  Mild, Stage  II, Bilateral Lower Extremity Lymphedema 2/2 CVI and Obesity  Skin  Description Hyper-Keratosis Peau' de Orange Shiny Tight Fibrotic/ Indurated Fatty Doughy Spongy/ boggy       R>L x  x   Skin dry Flaky WNL Macerated   mildly      Color Redness Varicosities Blanching Hemosiderin Stain Mottled   x     x   Odor Malodorous Yeast Fungal infection  WNL      x   Temperature Warm Cool wnl    x     Pitting Edema   1+ 2+ 3+ 4+ Non-pitting         x   Girth Symmetrical Asymmetrical                   Distribution    R>L toes to groin    Stemmer Sign Positive Negative   +    Lymphorrhea History Of:  Present Absent     x    Wounds History Of Present Absent Venous Arterial Pressure Sheer     x        Signs of Infection Redness Warmth Erythema Acute Swelling Drainage Borders                    Sensation Light Touch Deep pressure Hypersensitivity   Present Impaired Present Impaired Absent Impaired   x Tactile  x  x     Nails WNL   Fungus  nail dystrophy   x     Hair Growth Symmetrical Asymmetrical   x    Skin Creases Base of toes  Ankles   Base of  Fingers knees       Abdominal pannus Thigh Lobules  Face/neck   x x  x      (Blank rows = not tested)    LYMPHEDEMA LIFE IMPACT SCALE (LLIS): TBA OT Rx visit 1  TREATMENT DATE:  Pt edu for lymphedema progress towards goals and self-care   PATIENT EDUCATION:  Continued Pt/ CG edu for lymphedema self care home program throughout session. Topics include outcome of comparative limb volumetrics- starting limb volume differentials (LVDs), technology and gradient techniques used for short stretch, multilayer compression wrapping, simple self-MLD, therapeutic lymphatic pumping exercises, skin/nail care, LE precautions, compression garment recommendations and specifications, wear and care schedule and compression garment donning / doffing w assistive devices. Discussed progress towards all OT goals since commencing CDT. Discussed detrimental impact of obesity on lower and upper extremity lymphedema over time. Reviewed OT goals for lymphedema care with Pt and discussed progress to date.  All questions answered to the Pt's satisfaction. Good return. Person educated: Patient  Education method: Explanation, Demonstration, and Handouts Education comprehension: verbalized understanding, returned demonstration, verbal cues required, and needs further education   HOME EXERCISE PROGRAM: BLE lymphatic pumping there ex using- 1 sets of 10 reps, each exercise in order-  1-2 x daily, bilaterally Simple self MLD 1 x daily Daily skin care to increase hydration, skin mobility and decrease infection risk- can be done during MLD During Intensive Phase CDT: Compression wraps 23/7 until limb volume reduction complete During Self-management Phase CDT: Fit with appropriate compression garments or alternatives. Consider BLE, knee length, Mediven, custom, CircAid , Velcro style leggings over soft cotton  liners.  ASSESSMENT: CLINICAL IMPRESSION: Emphasis of session on manual therapy to LLE today. Provided MLD to LLE utilizing functional inguinal LN, and typical non-cancer related lymphedema sequences. Pt tolerated fibrosis techniques to dense fibrosis at medial distal thigh without increased pain. Pt continuously slid down on Rx plinth into sacral sitting throughout session, most likely due to hamstring tightness. Next session open hip angle of bed and position Pt w pillows under slightly bent knees to reduce strain on lower back and to support optimal positioning for MLD. Cont as per POC.  Today we continued teaching Pt how don and gauge the adjustable, CircAid, compression , knee length, legging. After multiple attempts and frequent repositioning sitting on the side of our firm, stable, Rx bed, positioning the R foot and leg on a footstool, and using a reacher Pt is able to position CircAid garment in correct posit on the R leg where she is able to reach it to gauge the compression tabs to achieve 30-40 mmHg on her lowe leg from ankle to popliteal fossa. This took out full hour long session, but with practice I predict Pt will be able to reduce time            dramatically. Cont teaching this skill until Pt feels confident that she is able to complete this task effectively and in a timely way. We applied compression wraps to the LLE today below the knee. Cont as per POC.   (INITIAL EVAL 07/12/23: Judith Arnold is a 73 yo female presenting with chronic, progressive, BLE swelling 2/2 unknown etiology, which at first glance appears to be circulatory in nature due to skin coloring at distal legs where swelling is most invested. Pt is presently unable to wear traditional elastic compression garments due to difficulty donning and doffing them.  Chronic, progressive lymphedema with associated skin changes, including fibrosis, limits this patent's functional  performance in all occupational domains,  including functional ambulation and mobility, basic and instrumental ADLs (lower body dressing, LB bathing, fitting street shoes and LB clothing, driving, shopping, and home management. It also limits perform productive activities, leisure pursuits, and participation in social and community activities. BLE lymphedema contributes to elevated infection risk. Pt will benefit from skilled OT for Complete Decongestive Therapy (CDT), which  typically includes manual lymphatic drainage (MLD), skin care to limit' infection risk and increase skin excursion, lymphatic pumping exercise, and during the Intensive Phase multilayer, gradient compression bandaging to reduce limb volume. Once limb volume is reduced as much as possible, Pt is fitted with appropriate compression garments and/ or devices and transitions into the self management phase of care consisting of follow long support PRN.    Pt understands that her fair prognosis will become poor without daily assistance with compression wrapping since she is unable to reach feet and legs to apply them herself. Pt assures OT that she has a friend she believes is willing to assist her between OT sessions. )   OBJECTIVE IMPAIRMENTS: decreased standing and walking tolerance, activity tolerance, decreased balance, decreased knowledge of condition, decreased knowledge of use of DME, decreased mobility, decreased ROM, decreased strength, increased edema, impaired flexibility, impaired sensation, impaired UE functional use, impaired vision/reception, pain, and chronic, progressive, BLE swelling and associated skin changes, at increased infection risk   ACTIVITY LIMITATIONS: Functional ambulation and mobility (lifting, carrying, steps/stairs, transfers, squatting) , Basic and instrumental ADLs, leisure pursuits, productive activities, social participation  PARTICIPATION LIMITATIONS: meal prep, cleaning, laundry, driving, shopping, yard work, and    PERSONAL FACTORS: Age,  Fitness, Past/current experiences, Time since onset of injury/illness/exacerbation, 2+ co morbidities: OSA, HTN,  are also affecting patient's functional outcome.   REHAB POTENTIAL: Good  EVALUATION COMPLEXITY: Moderate   GOALS: Goals reviewed with patient? Yes  SHORT TERM GOALS: Target date: 4th OT Rx visit   Pt will demonstrate understanding of lymphedema precautions and prevention strategies with modified independence using a printed reference to identify at least 5 precautions and discussing how s/he may implement them into daily life to reduce risk of progression with extra time. Baseline:Max A Goal status: GOAL MET  2.  Pt will be able to apply multilayer, knee length, gradient, compression wraps to one leg at a time from toes to below knee with max caregiver assist to decrease limb volume, to limit infection risk, and to limit lymphedema progression.  Baseline: Dependent Goal status: GOAL MET  LONG TERM GOALS: Target date: 09/19/23  1.Given this patient's Intake score of TBA % on the Lymphedema Life Impact Scale (LLIS), patient will experience a reduction of at least 5 points in her perceived level of functional impairment resulting from lymphedema to improve functional performance and quality of life (QOL). Baseline: TBA % Goal status:  GOAL DEFERRED  2.  Pt will achieve at least a 10% volume reduction in B legs to return limb to typical size and shape, to limit infection risk and LE progression, to decrease pain, to improve function. Baseline: Dependent Goal status: PROGRESSING. R LEG volume reduced by 14.7% since initially measured on 07/18/21  3.  Pt will obtain appropriate compression garments/devices and achieve modified independence (extra time + assistive devices) with donning/doffing to optimize limb volume reductions and limit LE progression over time. Baseline: Dependent Goal status: PROGRESSING. Pt obtained R ession garment alternative and independence with donning and  doffing at home is increasing with practice.  During Intensive phase CDT, with max  CG assistance, Pt will achieve at least 85% compliance with all adapted lymphedema self-care home program components, including daily skin care, compression wraps and /or garments, simple self MLD and lymphatic pumping therex to habituate LE self care protocol  into ADLs for optimal LE self-management over time. Baseline: Dependent Goal status: GOAL MET  PLAN:  OT FREQUENCY: 2 x/week  OT DURATION: 12 weeks  PLANNED INTERVENTIONS:compression bandaging, skin care,  97110-Therapeutic exercises, 97530- Therapeutic activity, 97535- Self Care, Manual lymph drainage, DME instructions, and fit with appropriate compression  PLAN FOR NEXT SESSION:  Measure R leg to assess progress towards a stocking, or some alternative Cont Pt edu for LE self-care   Zebedee Dec, MS, OTR/L, CLT-LANA 11/20/23 10:12 AM

## 2023-11-20 NOTE — Therapy (Signed)
 OUTPATIENT PHYSICAL THERAPY THORACOLUMBAR TREATMENT   Patient Name: Judith Arnold MRN: [id number] DOB:05/23/1950, 73 y.o., female Today's Date: 11/20/2023  END OF SESSION:  PT End of Session - 11/20/23 1027     Visit Number 14    Number of Visits 25    Date for PT Re-Evaluation 12/12/23    Progress Note Due on Visit 20    PT Start Time 1021    PT Stop Time 1102    PT Time Calculation (min) 41 min    Equipment Utilized During Treatment Gait belt    Activity Tolerance Patient tolerated treatment well;Patient limited by pain    Behavior During Therapy WFL for tasks assessed/performed                   Past Medical History:  Diagnosis Date   Allergy    Arthritis    lower back, left shoulder   Asthma    Carpal tunnel syndrome on both sides    Dental bridge present    top   Dental crown present    multiple   Family history of adverse reaction to anesthesia    Father and sister - PONV   GERD (gastroesophageal reflux disease)    Glaucoma    Hypertension    Leaky heart valve    Pre-diabetes    Sleep apnea    Past Surgical History:  Procedure Laterality Date   CATARACT EXTRACTION W/PHACO Right 11/16/2016   Procedure: CATARACT EXTRACTION PHACO AND INTRAOCULAR LENS PLACEMENT (IOC)  right;  Surgeon: Mittie Gaskin, MD;  Location: [facility];  Service: Ophthalmology;  Laterality: Right;  pre diabetic latex sensitivity sleep apnea   CATARACT EXTRACTION W/PHACO Left 01/18/2017   Procedure: CATARACT EXTRACTION PHACO AND INTRAOCULAR LENS PLACEMENT (IOC) LEFT DIABETIC;  Surgeon: Mittie Gaskin, MD;  Location: [facility];  Service: Ophthalmology;  Laterality: Left;   COLONOSCOPY     COLONOSCOPY WITH PROPOFOL  N/A 04/09/2018   Procedure: COLONOSCOPY WITH PROPOFOL ;  Surgeon: Viktoria Lamar DASEN, MD;  Location: [facility];  Service: Endoscopy;  Laterality: N/A;   IRIDOTOMY / IRIDECTOMY Bilateral 2005   TOOTH EXTRACTION     Patient Active  Problem List   Diagnosis Date Noted   Chronic bilateral low back pain with right-sided sciatica 09/29/2023   Hypertension associated with diabetes (HCC) 01/20/2023   Combined hyperlipidemia associated with type 2 diabetes mellitus (HCC) 01/20/2023   Intertrigo 11/01/2022   Stasis dermatitis of both legs 10/21/2022   Leg swelling 10/21/2022   Lymphedema of lower extremity 10/21/2022   Type 2 diabetes mellitus without complication, without long-term current use of insulin (HCC) 05/24/2022   Mixed hyperlipidemia 05/24/2022   Essential hypertension, benign 05/24/2022   Absolute anemia 05/24/2022   Gastroesophageal reflux disease without esophagitis 05/24/2022   Allergic rhinitis 05/24/2022   Asthma, allergic, moderate persistent, uncomplicated 05/24/2022   Bilateral chronic angle-closure glaucoma, indeterminate stage 05/24/2022   Airway hyperreactivity 08/29/2013   OSA on CPAP 08/29/2013    PCP: Fernand Fredy RAMAN, MD  REFERRING PROVIDER: Fernand Fredy, RAMAN, MD  REFERRING DIAG:  Diagnosis  G89.29,M54.41 (ICD-10-CM) - Chronic bilateral low back pain with right-sided sciatica    Rationale for Evaluation and Treatment: Rehabilitation  THERAPY DIAG:  Other low back pain  Pain in right hip  Difficulty in walking, not elsewhere classified  Muscle weakness (generalized)  Other lack of coordination  ONSET DATE: >3 years   SUBJECTIVE:  SUBJECTIVE STATEMENT:  Today:  Patient reports back is hurting but does feel like the hip/groin       Patient reports 3/10 low back pain.   From EVAL: Pt is a pleasant 73 y/o female presenting to PT eval for chronic LBP with R side hip pain. She reports onset of LBP over 3 years ago. Primary site of pain is mostly felt in R hip and lower spine. Pt reports hx of  arthritis in lower back and R hip. She is now starting to feel arthritis pain in her knees as well. Reports RLE is actually her good leg, has some issues with pain into groin region of LLE. Pt states strength in legs has been impacted and that her general mobility is worse. Pt can only ambulate comfortably by taking weight off of lower back by using her 4WW. She states not using 4WW so much for balance, but for reducing pain with gait. She is unable to stand up fully due to pain. Pt says she used to be very fit (took take karate, gymnastics years ago). She is also seeing OT for BLE lymphedema management. She endorses difficulty getting out of chairs, difficulty with steps, she must use a ramp to get in and out of her home. Pt reports my brain does not retain what I'm talking about, can lose train of thought.  PERTINENT HISTORY:  PMH per chart includes arthritis low back and left shoulder, bilateral carpal tunnel, asthma, glaucoma, HTN, leaky heart valve, sleep apnea, pre-diabetes, being seen by lymphedema specialist for BLE lymphedema   PAIN:  Are you having pain? LBP and R hip pain  Lowest pain level: 0/10  With standing: 2/10  Worst: 5-6/10   PRECAUTIONS: LATEX ALLERGY per chart, fall  RED FLAGS: None   WEIGHT BEARING RESTRICTIONS: No  FALLS:  Has patient fallen in last 6 months? No  LIVING ENVIRONMENT: Uses ramp to get into home due to difficulty with steps, has 4WW  PLOF: Independent  PATIENT GOALS:  Says due to back pain she is unable to reach my feet, has difficulty getting her socks on without a grabber, wants to be more flexible and stronger to increase ease with these activities and mobility    OBJECTIVE:  Note: Objective measures were completed at Evaluation unless otherwise noted.  DIAGNOSTIC FINDINGS:  No recent pertinent imaging available in chart  PATIENT SURVEYS:  Modified Oswestry score: 34%  Interpretation of scores: Score Category Description  0-20%  Minimal Disability The patient can cope with most living activities. Usually no treatment is indicated apart from advice on lifting, sitting and exercise  21-40% Moderate Disability The patient experiences more pain and difficulty with sitting, lifting and standing. Travel and social life are more difficult and they may be disabled from work. Personal care, sexual activity and sleeping are not grossly affected, and the patient can usually be managed by conservative means  41-60% Severe Disability Pain remains the main problem in this group, but activities of daily living are affected. These patients require a detailed investigation  61-80% Crippled Back pain impinges on all aspects of the patient's life. Positive intervention is required  81-100% Bed-bound  These patients are either bed-bound or exaggerating their symptoms  Bluford FORBES Zoe DELENA Karon DELENA, et al. Surgery versus conservative management of stable thoracolumbar fracture: the PRESTO feasibility RCT. [place]): [employer]; 2021 Nov. [facility] Technology Assessment, No. 25.62.) Appendix 3, Oswestry Disability Index category descriptors. Available from: FindJewel[employer].cz  Minimally Clinically Important Difference (  MCID) = 12.8%  COGNITION: Overall cognitive status: Within functional limits for tasks assessed, very pleasant pt can become tangential and requires redirection to activity    SENSATION: Intact to light touch BLE   MUSCLE LENGTH: deferred  POSTURE: increased thoracic kyphosis, rounded shoulders, unable to achieve full upright position due to pain, maintains increased hip and knee flexion when attempting to stand fully upright   PALPATION: deferred  Thoracolumbar AROM: Extension - unable to achieve neutral position, remains in significant flexion  Flexion - lacking at least 25%  Rotation - lacking at least 40% bilat  and pain-limited    LOWER EXTREMITY MMT:    MMT  Right eval Left eval  Hip flexion 4- 3*  Hip extension    Hip abduction 4+ 4+  Hip adduction 4+ 4+  Hip internal rotation    Hip external rotation    Knee flexion 4 3*  Knee extension 4+ 4+  Ankle dorsiflexion 4 4+  Ankle plantarflexion    Ankle inversion    Ankle eversion     (Blank rows = not tested) *=pain limited   LUMBAR SPECIAL TESTS:  deferred  FUNCTIONAL TESTS:  10 meter walk test: 0.63 m/s crouched posture, decreased hip extension, decreased step-length and heel strike, elevated R>L shoulder, heavy BUE weightbearing on 4WW  GAIT: Distance walked: 10MWT/clinic distances (see above) Assistive device utilized: Environmental consultant - 4 wheeled Level of assistance: Modified independence Comments: gait mechanics impaired: decreased gait speed, crouched posture, decreased hip extension, decreased step-length and heel strike, elevated R>L shoulder, heavy BUE weightbearing on 4WW  TREATMENT DATE: 11/20/23    TE:  Seated hip march 2 x 10  (1 set using 3# AW and 2nd set without AW on LE)  Seated knee ext 2 x 8 (3# on RLE only) - Some report of left groin pain with extending LLE.   Standing lumbar flex Stretch - hold 45 sec x 4 Standing at wall - rolling green theraball up wall to improve Lumbar ext/erect standing x 8- Patient stopped complaining of left shoulder pain.  Standing at wall- hold erect posture- some flex of knees and difficulty extending trunk- able to hold 30 sec x 2.   TA:  Side stepping in // bars - down and back x 5 with Min UE    Resistive ambulation with 4WW x approx 350 feet with 3# AW and 18# of aw laying on seat of walker- Patient endorse some fatigue and shortness of breath. O2 sat = 96% and HR=96 bpm.  Sit to stand from seat of 4WW with min UE support x 12 reps                                                                                               PATIENT EDUCATION:  Education details: Provided education on findings of exam, indications for plan,  prognoses, goals, how PT can help                 Person educated: Patient Education method: Explanation Education comprehension: verbalized understanding  HOME EXERCISE PROGRAM: Access Code: [email or web address] URL: [email or web address] Date: 10/05/2023 Prepared by:  Lonni Gainer  Exercises - Supine Bridge  - 3 x weekly - 3 sets - 10 reps - Clamshell  - 3 x weekly - 3 sets - 10 reps - Supine Lower Trunk Rotation  - 1 x daily - 3 sets - 30 sec hold - Seated Hamstring Stretch  - 1 x daily - 3 sets - 30 hold - Seated Clamshell  - 1 x daily - 7 x weekly - 2 sets - 20 reps - 3 sec hold - Supine Transversus Abdominis Bracing - Hands on Stomach  - 3 x weekly - 3 sets - 10 reps - Supine Gluteal Sets  - 3 x weekly - 3 sets - 10 reps  ASSESSMENT:  CLINICAL IMPRESSION:  Patient presents with ongoing low back pain and intermittent left groin pain making increasing standing time difficult. She does push herself to try all activities and did perform better with side stepping than previous visits. She was fatigued with several standing activities but overall making progress and able to stand straighter in session today than any previous session.  Pt will continue to benefit from skilled physical therapy intervention to address impairments, improve QOL, and attain therapy goals.    OBJECTIVE IMPAIRMENTS: Abnormal gait, decreased activity tolerance, decreased balance, decreased mobility, difficulty walking, decreased strength, hypomobility, increased edema, improper body mechanics, postural dysfunction, and pain.   ACTIVITY LIMITATIONS: carrying, lifting, bending, squatting, stairs, transfers, bed mobility, and locomotion level  PARTICIPATION LIMITATIONS: meal prep, cleaning, shopping, community activity, and yard work  PERSONAL FACTORS: Age, Fitness, Sex, Time since onset of injury/illness/exacerbation, and 3+ comorbidities: PMH per chart includes arthritis low back and left shoulder,  bilateral carpal tunnel, asthma, glaucoma, HTN, leaky heart valve, sleep apnea, pre-diabetes, being seen by lymphedema specialist for BLE lymphedema  are also affecting patient's functional outcome.   REHAB POTENTIAL: Good  CLINICAL DECISION MAKING: Evolving/moderate complexity  EVALUATION COMPLEXITY: Moderate   GOALS: Goals reviewed with patient? Yes   SHORT TERM GOALS: Target date: 10/31/2023    Patient will be independent in home exercise program to improve strength/mobility for better functional independence with ADLs. Baseline:to be initiated; 11/06/2023=Patient reports knowledgeable of HEP but not always able to perform and sometimes painful so has to modify.  Goal status: MET   LONG TERM GOALS: Target date: 12/12/2023    Patient will reduce modified Oswestry score to <20 as to demonstrate minimal disability with ADLs including improved sleeping tolerance, walking/sitting tolerance etc for better mobility with ADLs.  Baseline: 34; 11/06/2023= 32 Goal status: INITIAL  2.  Patient (> 17 years old) will complete five times sit to stand test in < 15 seconds indicating an increased LE strength and improved balance. Baseline: 09/26/2023= 26.46 sec with heavy UE Support; 11/06/2023= 11/06/2023= 22.31 sec without UE Support Goal status: PROGRESSING  3.  Patient will increase Berg Balance score by > 6 points to demonstrate decreased fall risk during functional activities Baseline: 09/26/2023= 31/56; 11/06/2023= 40/56 Goal status: MET  4.  Patient will increase 10 meter walk test to >1.31m/s as to improve gait speed for better community ambulation and to reduce fall risk. Baseline:  0.63 m/s with 4WW; 0.74 m/s Goal status: PROGRESSING      PLAN:  PT FREQUENCY: 1-2x/week  PT DURATION: 12 weeks  PLANNED INTERVENTIONS: 97164- PT Re-evaluation, 97750- Physical Performance Testing, 97110-Therapeutic exercises, 97530- Therapeutic activity, [id number]- Neuromuscular re-education, 97535- Self  Care, [id number]- Manual therapy, [id number]- Gait training, [id number]- Orthotic Initial, [id number]- Orthotic/Prosthetic subsequent, [phone]- Canalith repositioning, Patient/Family education, Balance  training, Stair training, Taping, Joint mobilization, Spinal mobilization, Vestibular training, DME instructions, Cryotherapy, and Moist heat.  PLAN FOR NEXT SESSION:   Continue to progress HEP Manual therapy for lumbar ROM/pain Activities for ROM, Low back/LE/Core strengthening Continue to progress functional mobility and balance.     Reyes LOISE London, PT 11/20/2023, 11:36 AM

## 2023-11-22 ENCOUNTER — Encounter: Admitting: Occupational Therapy

## 2023-11-22 ENCOUNTER — Ambulatory Visit

## 2023-11-23 ENCOUNTER — Ambulatory Visit

## 2023-11-23 ENCOUNTER — Ambulatory Visit: Admitting: Occupational Therapy

## 2023-11-23 ENCOUNTER — Encounter: Payer: Self-pay | Admitting: Occupational Therapy

## 2023-11-23 DIAGNOSIS — I890 Lymphedema, not elsewhere classified: Secondary | ICD-10-CM | POA: Diagnosis not present

## 2023-11-23 DIAGNOSIS — M25551 Pain in right hip: Secondary | ICD-10-CM | POA: Diagnosis not present

## 2023-11-23 DIAGNOSIS — M6281 Muscle weakness (generalized): Secondary | ICD-10-CM | POA: Diagnosis not present

## 2023-11-23 DIAGNOSIS — R262 Difficulty in walking, not elsewhere classified: Secondary | ICD-10-CM | POA: Diagnosis not present

## 2023-11-23 DIAGNOSIS — R278 Other lack of coordination: Secondary | ICD-10-CM

## 2023-11-23 DIAGNOSIS — M5459 Other low back pain: Secondary | ICD-10-CM

## 2023-11-23 NOTE — Therapy (Signed)
 OUTPATIENT PHYSICAL THERAPY THORACOLUMBAR TREATMENT   Patient Name: Judith Arnold MRN: [id number] DOB:[birth year], 73 y.o., female Today's Date: 11/24/2023  END OF SESSION:  PT End of Session - 11/23/23 1350     Visit Number 15    Number of Visits 25    Date for PT Re-Evaluation 12/12/23    Progress Note Due on Visit 20    PT Start Time 1404    PT Stop Time 1444    PT Time Calculation (min) 40 min    Equipment Utilized During Treatment Gait belt    Activity Tolerance Patient tolerated treatment well;Patient limited by pain    Behavior During Therapy WFL for tasks assessed/performed                   Past Medical History:  Diagnosis Date   Allergy    Arthritis    lower back, left shoulder   Asthma    Carpal tunnel syndrome on both sides    Dental bridge present    top   Dental crown present    multiple   Family history of adverse reaction to anesthesia    Father and sister - PONV   GERD (gastroesophageal reflux disease)    Glaucoma    Hypertension    Leaky heart valve    Pre-diabetes    Sleep apnea    Past Surgical History:  Procedure Laterality Date   CATARACT EXTRACTION W/PHACO Right 11/16/2016   Procedure: CATARACT EXTRACTION PHACO AND INTRAOCULAR LENS PLACEMENT (IOC)  right;  Surgeon: Mittie Gaskin, MD;  Location: [facility];  Service: Ophthalmology;  Laterality: Right;  pre diabetic latex sensitivity sleep apnea   CATARACT EXTRACTION W/PHACO Left 01/18/2017   Procedure: CATARACT EXTRACTION PHACO AND INTRAOCULAR LENS PLACEMENT (IOC) LEFT DIABETIC;  Surgeon: Mittie Gaskin, MD;  Location: [facility];  Service: Ophthalmology;  Laterality: Left;   COLONOSCOPY     COLONOSCOPY WITH PROPOFOL  N/A 04/09/2018   Procedure: COLONOSCOPY WITH PROPOFOL ;  Surgeon: Viktoria Lamar DASEN, MD;  Location: [facility];  Service: Endoscopy;  Laterality: N/A;   IRIDOTOMY / IRIDECTOMY Bilateral 2005   TOOTH EXTRACTION     Patient Active  Problem List   Diagnosis Date Noted   Chronic bilateral low back pain with right-sided sciatica 09/29/2023   Hypertension associated with diabetes (HCC) 01/20/2023   Combined hyperlipidemia associated with type 2 diabetes mellitus (HCC) 01/20/2023   Intertrigo 11/01/2022   Stasis dermatitis of both legs 10/21/2022   Leg swelling 10/21/2022   Lymphedema of lower extremity 10/21/2022   Type 2 diabetes mellitus without complication, without long-term current use of insulin (HCC) 05/24/2022   Mixed hyperlipidemia 05/24/2022   Essential hypertension, benign 05/24/2022   Absolute anemia 05/24/2022   Gastroesophageal reflux disease without esophagitis 05/24/2022   Allergic rhinitis 05/24/2022   Asthma, allergic, moderate persistent, uncomplicated 05/24/2022   Bilateral chronic angle-closure glaucoma, indeterminate stage 05/24/2022   Airway hyperreactivity 08/29/2013   OSA on CPAP 08/29/2013    PCP: Fernand Fredy RAMAN, MD  REFERRING PROVIDER: Fernand Fredy, RAMAN, MD  REFERRING DIAG:  Diagnosis  G89.29,M54.41 (ICD-10-CM) - Chronic bilateral low back pain with right-sided sciatica    Rationale for Evaluation and Treatment: Rehabilitation  THERAPY DIAG:  Other low back pain  Difficulty in walking, not elsewhere classified  Pain in right hip  Muscle weakness (generalized)  Other lack of coordination  ONSET DATE: >3 years   SUBJECTIVE:  SUBJECTIVE STATEMENT:  Today:  Patient reports doing okay just having a slower day. States pain is about the same.      Patient reports 3/10 low back pain.   From EVAL: Pt is a pleasant 73 y/o female presenting to PT eval for chronic LBP with R side hip pain. She reports onset of LBP over 3 years ago. Primary site of pain is mostly felt in R hip and lower spine. Pt  reports hx of arthritis in lower back and R hip. She is now starting to feel arthritis pain in her knees as well. Reports RLE is actually her good leg, has some issues with pain into groin region of LLE. Pt states strength in legs has been impacted and that her general mobility is worse. Pt can only ambulate comfortably by taking weight off of lower back by using her 4WW. She states not using 4WW so much for balance, but for reducing pain with gait. She is unable to stand up fully due to pain. Pt says she used to be very fit (took take karate, gymnastics years ago). She is also seeing OT for BLE lymphedema management. She endorses difficulty getting out of chairs, difficulty with steps, she must use a ramp to get in and out of her home. Pt reports my brain does not retain what I'm talking about, can lose train of thought.  PERTINENT HISTORY:  PMH per chart includes arthritis low back and left shoulder, bilateral carpal tunnel, asthma, glaucoma, HTN, leaky heart valve, sleep apnea, pre-diabetes, being seen by lymphedema specialist for BLE lymphedema   PAIN:  Are you having pain? LBP and R hip pain  Lowest pain level: 0/10  With standing: 2/10  Worst: 5-6/10   PRECAUTIONS: LATEX ALLERGY per chart, fall  RED FLAGS: None   WEIGHT BEARING RESTRICTIONS: No  FALLS:  Has patient fallen in last 6 months? No  LIVING ENVIRONMENT: Uses ramp to get into home due to difficulty with steps, has 4WW  PLOF: Independent  PATIENT GOALS:  Says due to back pain she is unable to reach my feet, has difficulty getting her socks on without a grabber, wants to be more flexible and stronger to increase ease with these activities and mobility    OBJECTIVE:  Note: Objective measures were completed at Evaluation unless otherwise noted.  DIAGNOSTIC FINDINGS:  No recent pertinent imaging available in chart  PATIENT SURVEYS:  Modified Oswestry score: 34%  Interpretation of scores: Score Category  Description  0-20% Minimal Disability The patient can cope with most living activities. Usually no treatment is indicated apart from advice on lifting, sitting and exercise  21-40% Moderate Disability The patient experiences more pain and difficulty with sitting, lifting and standing. Travel and social life are more difficult and they may be disabled from work. Personal care, sexual activity and sleeping are not grossly affected, and the patient can usually be managed by conservative means  41-60% Severe Disability Pain remains the main problem in this group, but activities of daily living are affected. These patients require a detailed investigation  61-80% Crippled Back pain impinges on all aspects of the patient's life. Positive intervention is required  81-100% Bed-bound  These patients are either bed-bound or exaggerating their symptoms  Bluford FORBES Zoe DELENA Karon DELENA, et al. Surgery versus conservative management of stable thoracolumbar fracture: the PRESTO feasibility RCT. [place] ([place]): [employer]; 2021 Nov. [facility] Technology Assessment, No. 25.62.) Appendix 3, Oswestry Disability Index category descriptors. Available from: FindJewel[employer].cz  Minimally  Clinically Important Difference (MCID) = 12.8%  COGNITION: Overall cognitive status: Within functional limits for tasks assessed, very pleasant pt can become tangential and requires redirection to activity    SENSATION: Intact to light touch BLE   MUSCLE LENGTH: deferred  POSTURE: increased thoracic kyphosis, rounded shoulders, unable to achieve full upright position due to pain, maintains increased hip and knee flexion when attempting to stand fully upright   PALPATION: deferred  Thoracolumbar AROM: Extension - unable to achieve neutral position, remains in significant flexion  Flexion - lacking at least 25%  Rotation - lacking at least 40% bilat  and pain-limited    LOWER EXTREMITY MMT:     MMT Right eval Left eval  Hip flexion 4- 3*  Hip extension    Hip abduction 4+ 4+  Hip adduction 4+ 4+  Hip internal rotation    Hip external rotation    Knee flexion 4 3*  Knee extension 4+ 4+  Ankle dorsiflexion 4 4+  Ankle plantarflexion    Ankle inversion    Ankle eversion     (Blank rows = not tested) *=pain limited   LUMBAR SPECIAL TESTS:  deferred  FUNCTIONAL TESTS:  10 meter walk test: 0.63 m/s crouched posture, decreased hip extension, decreased step-length and heel strike, elevated R>L shoulder, heavy BUE weightbearing on 4WW  GAIT: Distance walked: 10MWT/clinic distances (see above) Assistive device utilized: Environmental consultant - 4 wheeled Level of assistance: Modified independence Comments: gait mechanics impaired: decreased gait speed, crouched posture, decreased hip extension, decreased step-length and heel strike, elevated R>L shoulder, heavy BUE weightbearing on 4WW  TREATMENT DATE: 11/24/23      TA:  Side stepping along the support bar- Left to right then back with 3# x 5 with Min UE  (patient reports as Medium challenge)   Resistive ambulation with 4WW x approx 350 feet with 3# AW - Reciprocal steps with forward trunk lean Sit to stand from seat of 4WW with min UE support x 12 reps  Resistive Hip ext 3# AW alt LE x 15 reps  Resistive ambulation with 4WW x approx 350 feet with 3# AW (with bathroom break per patient request)                                                                                                PATIENT EDUCATION:  Education details: Provided education on findings of exam, indications for plan, prognoses, goals, how PT can help                 Person educated: Patient Education method: Explanation Education comprehension: verbalized understanding  HOME EXERCISE PROGRAM: Access Code: [email or web address] URL: [email or web address] Date: 10/05/2023 Prepared by: Lonni Gainer  Exercises - Supine Bridge  - 3 x weekly - 3  sets - 10 reps - Clamshell  - 3 x weekly - 3 sets - 10 reps - Supine Lower Trunk Rotation  - 1 x daily - 3 sets - 30 sec hold - Seated Hamstring Stretch  - 1 x daily - 3 sets - 30 hold - Seated Clamshell  - 1 x daily - 7 x weekly -  2 sets - 20 reps - 3 sec hold - Supine Transversus Abdominis Bracing - Hands on Stomach  - 3 x weekly - 3 sets - 10 reps - Supine Gluteal Sets  - 3 x weekly - 3 sets - 10 reps  ASSESSMENT:  CLINICAL IMPRESSION:  Treatment continued on path of increasing her standing time and trying to improve her functional LE strength and standing ability. Patient continues to perform well- abe to walk with good reciprocal step with use of ankle resistance well today. She continues to exhibit excessive forward trunk lean yet able to look ahead with VC today.  Pt will continue to benefit from skilled physical therapy intervention to address impairments, improve QOL, and attain therapy goals.    OBJECTIVE IMPAIRMENTS: Abnormal gait, decreased activity tolerance, decreased balance, decreased mobility, difficulty walking, decreased strength, hypomobility, increased edema, improper body mechanics, postural dysfunction, and pain.   ACTIVITY LIMITATIONS: carrying, lifting, bending, squatting, stairs, transfers, bed mobility, and locomotion level  PARTICIPATION LIMITATIONS: meal prep, cleaning, shopping, community activity, and yard work  PERSONAL FACTORS: Age, Fitness, Sex, Time since onset of injury/illness/exacerbation, and 3+ comorbidities: PMH per chart includes arthritis low back and left shoulder, bilateral carpal tunnel, asthma, glaucoma, HTN, leaky heart valve, sleep apnea, pre-diabetes, being seen by lymphedema specialist for BLE lymphedema  are also affecting patient's functional outcome.   REHAB POTENTIAL: Good  CLINICAL DECISION MAKING: Evolving/moderate complexity  EVALUATION COMPLEXITY: Moderate   GOALS: Goals reviewed with patient? Yes   SHORT TERM GOALS: Target  date: 10/31/2023    Patient will be independent in home exercise program to improve strength/mobility for better functional independence with ADLs. Baseline:to be initiated; 11/06/2023=Patient reports knowledgeable of HEP but not always able to perform and sometimes painful so has to modify.  Goal status: MET   LONG TERM GOALS: Target date: 12/12/2023    Patient will reduce modified Oswestry score to <20 as to demonstrate minimal disability with ADLs including improved sleeping tolerance, walking/sitting tolerance etc for better mobility with ADLs.  Baseline: 34; 11/06/2023= 32 Goal status: INITIAL  2.  Patient (> 61 years old) will complete five times sit to stand test in < 15 seconds indicating an increased LE strength and improved balance. Baseline: 09/26/2023= 26.46 sec with heavy UE Support; 11/06/2023= 11/06/2023= 22.31 sec without UE Support Goal status: PROGRESSING  3.  Patient will increase Berg Balance score by > 6 points to demonstrate decreased fall risk during functional activities Baseline: 09/26/2023= 31/56; 11/06/2023= 40/56 Goal status: MET  4.  Patient will increase 10 meter walk test to >1.13m/s as to improve gait speed for better community ambulation and to reduce fall risk. Baseline:  0.63 m/s with 4WW; 0.74 m/s Goal status: PROGRESSING      PLAN:  PT FREQUENCY: 1-2x/week  PT DURATION: 12 weeks  PLANNED INTERVENTIONS: 97164- PT Re-evaluation, 97750- Physical Performance Testing, 97110-Therapeutic exercises, 97530- Therapeutic activity, [id number]- Neuromuscular re-education, 97535- Self Care, [id number]- Manual therapy, [id number]- Gait training, [id number]- Orthotic Initial, [phone]- Orthotic/Prosthetic subsequent, [phone]- Canalith repositioning, Patient/Family education, Balance training, Stair training, Taping, Joint mobilization, Spinal mobilization, Vestibular training, DME instructions, Cryotherapy, and Moist heat.  PLAN FOR NEXT SESSION:   Continue to progress HEP Manual therapy  for lumbar ROM/pain Activities for ROM, Low back/LE/Core strengthening Continue to progress functional mobility and balance.     Reyes LOISE London, PT 11/24/2023, 10:11 AM

## 2023-11-23 NOTE — Therapy (Unsigned)
 OUTPATIENT OCCUPATIONAL THERAPY TREATMENT NOTE   BILATERAL LOWER EXTREMITY LYMPHEDEMA  Patient Name: Judith Arnold MRN: [id number] DOB:12-28-1950, 73 y.o., female Today's Date: 11/23/2023  REPORTING PERIOD: END OF SESSION:   OT End of Session - 11/23/23 1257     Visit Number 33    Number of Visits 36    Date for OT Re-Evaluation 01/16/24    OT Start Time 0100    Equipment Utilized During Treatment sock donner; footstool    Activity Tolerance Patient tolerated treatment well;No increased pain    Behavior During Therapy WFL for tasks assessed/performed           Past Medical History:  Diagnosis Date   Allergy    Arthritis    lower back, left shoulder   Asthma    Carpal tunnel syndrome on both sides    Dental bridge present    top   Dental crown present    multiple   Family history of adverse reaction to anesthesia    Father and sister - PONV   GERD (gastroesophageal reflux disease)    Glaucoma    Hypertension    Leaky heart valve    Pre-diabetes    Sleep apnea    Past Surgical History:  Procedure Laterality Date   CATARACT EXTRACTION W/PHACO Right 11/16/2016   Procedure: CATARACT EXTRACTION PHACO AND INTRAOCULAR LENS PLACEMENT (IOC)  right;  Surgeon: Mittie Gaskin, MD;  Location: [facility];  Service: Ophthalmology;  Laterality: Right;  pre diabetic latex sensitivity sleep apnea   CATARACT EXTRACTION W/PHACO Left 01/18/2017   Procedure: CATARACT EXTRACTION PHACO AND INTRAOCULAR LENS PLACEMENT (IOC) LEFT DIABETIC;  Surgeon: Mittie Gaskin, MD;  Location: [facility];  Service: Ophthalmology;  Laterality: Left;   COLONOSCOPY     COLONOSCOPY WITH PROPOFOL  N/A 04/09/2018   Procedure: COLONOSCOPY WITH PROPOFOL ;  Surgeon: Viktoria Lamar DASEN, MD;  Location: [facility];  Service: Endoscopy;  Laterality: N/A;   IRIDOTOMY / IRIDECTOMY Bilateral 2005   TOOTH EXTRACTION     Patient Active Problem List   Diagnosis Date Noted   Chronic  bilateral low back pain with right-sided sciatica 09/29/2023   Hypertension associated with diabetes (HCC) 01/20/2023   Combined hyperlipidemia associated with type 2 diabetes mellitus (HCC) 01/20/2023   Intertrigo 11/01/2022   Stasis dermatitis of both legs 10/21/2022   Leg swelling 10/21/2022   Lymphedema of lower extremity 10/21/2022   Type 2 diabetes mellitus without complication, without long-term current use of insulin (HCC) 05/24/2022   Mixed hyperlipidemia 05/24/2022   Essential hypertension, benign 05/24/2022   Absolute anemia 05/24/2022   Gastroesophageal reflux disease without esophagitis 05/24/2022   Allergic rhinitis 05/24/2022   Asthma, allergic, moderate persistent, uncomplicated 05/24/2022   Bilateral chronic angle-closure glaucoma, indeterminate stage 05/24/2022   Airway hyperreactivity 08/29/2013   OSA on CPAP 08/29/2013    PCP: Fredy GORMAN Bathe, MD  REFERRING PROVIDER: same  REFERRING DIAG: lymphedema I89.0  THERAPY DIAG:  Lymphedema, not elsewhere classified  Rationale for Evaluation and Treatment: Rehabilitation  ONSET DATE: Pt reports onset of L ankle / leg swelling after injury 20-30 yrs ago. Then about 1 year ago legs started swelling and Pt having prickly sensation on shins and thighs bilaterally.   SUBJECTIVE:  SUBJECTIVE STATEMENT: Judith Arnold presents to OT for treatment of BLE lymphedema 2/2 suspected CVI and obesity. Pt is unaccompanied today. Pt reports the friend who has been helping her with wraps between visits has become somewhat unreliable.  Pt reports her friend wrapped her L Leg once between visits. Pt reports 5/10 in her lower back. My legs are not hurting, no.   (INITIAL EVAL 07/12/23 : Judith Arnold is referred to Occupational Therapy by Fredy GORMAN Bathe, MD,  for evaluation and treatment of BLE lymphedema.  Pt reports onset of L ankle and distal leg swelling about 30 years ago, then noticed RLE swelling and worsening LLE swelling about 1 year ago at same time she started having unusual sensation in legs. Pt denies previously having lymphedema treatment. Pt reports paternal aunt had leg swelling. Pt is unable to wear compression stockings because she is unable to reach feet and distal legs to don and doff them due to back and hip pain.  PERTINENT HISTORY: Relevant to lymphedema (OSA (denies using CPAP), BLE stasis dermatitis, HTN, DM 2 (Pt states she is prediabetic only), B glaucoma, Back and R hip OA  PAIN:  Are you having LE-related pain? Yes: NPRS scale: 0/10 Pain location: medial proximal thighs w abduction, bilateral groin, buttocks Pain description: like stickers Aggravating factors: standing, walking, dependent sitting Relieving factors: elevation  PRECAUTIONS: Fall and Other: LYMPHEDEMA precautions  (DM 2 and asthma)  WEIGHT BEARING RESTRICTIONS: No  FALLS:  Has patient fallen in last 6 months? yes Fell asleep at the computer and fell onto the floor [place] when storm door hit me  LIVING ENVIRONMENT: Lives with: alone Lives in: House/apartment Stairs: No;  Has following equipment at home: Environmental consultant - 4 wheeled  OCCUPATION: retired Scientist, research (life sciences)  LEISURE: reading and taking notes on current events and politics  HAND DOMINANCE: right   PRIOR LEVEL OF FUNCTION: Independent with household mobility with device, Requires assistive device for independence, Needs assistance with ADLs, Needs assistance with homemaking, and Needs assistance with gait  PATIENT GOALS: reduce swelling and keep it from getting worse  OBJECTIVE: Note: Objective measures were completed at Evaluation unless otherwise noted.  COGNITION:  Overall cognitive status: impaired short term  memory   OBSERVATIONS / OTHER ASSESSMENTS: Mild, Stage  II, BLE Lymphedema 2/2 suspected venous insufficiency and hx of obesity   SENSATION: Reports uncomfortable scratching, or sticker-like  sensation on anterior thighs   POSTURE: Raised walker handles 2 notches to increase upright spinal alignment when walking to limit falls risk. Handles need to be raised at least 1 hole more for safe upright posture when walking.  Upright sitting posture: head forward , shoulders forward and rounded- appears to be flexible kyphosis  LE ROM: WFL for ankles and knees. Pt reports limited hip abduction  LE MMT: [facility]. Presenting with generalized weakness and debility  LYMPHEDEMA ASSESSMENTS:   SURGERY TYPE/DATE: Non-cancer related limb swelling  HX INFECTIONS: positive for 1 episode cellulitis treated w antibiotics  Hx WOUNDS: denies   BLE COMPARATIVE LIMB VOLUMETRICS: INITIAL 07/19/23  LANDMARK RIGHT (dominant)  R LEG (A-D) 3525.1 ml  R THIGH (E-G) ml  R FULL LIMB (A-G) ml  Limb Volume differential (LVD)  %  Volume change since initial %  Volume change overall V  (Blank rows = not tested)  LANDMARK LEFT    L LEG (A-D) 3769.6 ml  L THIGH (E-G) ml  L FULL LIMB (A-G) ml  Limb Volume differential (LVD)  Limb Volume Differential (LVD) measures 6.5%, L>R.  Volume change since initial %  Volume change overall %    RLE COMPARATIVE LIMB VOLUMETRICS: VISIT 9 08/15/23  LANDMARK RIGHT (dominant)  R LEG (A-D) 3549.4 ml  R THIGH (E-G) ml  R FULL LIMB (A-G) ml  Limb Volume differential (LVD)  %  Volume change since initial R LEG volume reduced by 5.84% since initially measured on 07/18/21  Volume change overall V  (Blank rows = not tested)  RLE COMPARATIVE LIMB VOLUMETRICS: VISIT 19 10/03/23  LANDMARK RIGHT (dominant)  R LEG (A-D) 3549.4 ml  R THIGH (E-G) ml  R FULL LIMB (A-G) ml  Limb Volume differential (LVD)  %  Volume change since initial R LEG volume reduced by 14.7% since initially measured on 07/18/21  Volume change overall V     BLE COMPARATIVE LIMB VOLUMETRICS: 29th Visit  LANDMARK RIGHT (dominant)  R LEG (A-D) 3438.1 ml  R THIGH (E-G) ml  R FULL LIMB (A-G) ml  Limb Volume differential (LVD)  %  Volume change since last measured on 10/03/23 (19th visit) DECREASED by 0.67%. R Leg limb volume stable.  Volume change overall Overall R Leg volume reduction = 2.5%. Goal not me to date  (Blank rows = not tested)  LANDMARK LEFT    L LEG (A-D) 4174.4  ml  L THIGH (E-G) ml  L FULL LIMB (A-G) ml  Limb Volume differential (LVD)  .  Volume change since initial on 07/19/23 L LEG volume INCREASED by 10.7% since initially measured on 07/19/23  Volume change overall L LEG volume INCREASED by 10.7& since initially measured on 07/19/23  (Blank rows = not tested)  Mild, Stage  II, Bilateral Lower Extremity Lymphedema 2/2 CVI and Obesity  Skin  Description Hyper-Keratosis Peau' de Orange Shiny Tight Fibrotic/ Indurated Fatty Doughy Spongy/ boggy       R>L x  x   Skin dry Flaky WNL Macerated   mildly      Color Redness Varicosities Blanching Hemosiderin Stain Mottled   x     x   Odor Malodorous Yeast Fungal infection  WNL      x   Temperature Warm Cool wnl    x     Pitting Edema   1+ 2+ 3+ 4+ Non-pitting         x   Girth Symmetrical Asymmetrical                   Distribution    R>L toes to groin    Stemmer Sign Positive Negative   +    Lymphorrhea History Of:  Present Absent     x    Wounds History Of Present Absent Venous Arterial Pressure Sheer     x        Signs of Infection Redness Warmth Erythema Acute Swelling Drainage Borders                    Sensation Light Touch Deep pressure Hypersensitivity   Present Impaired Present Impaired Absent Impaired   x Tactile  x  x     Nails WNL   Fungus nail dystrophy   x     Hair Growth Symmetrical Asymmetrical   x    Skin Creases Base of toes  Ankles   Base of Fingers knees       Abdominal pannus Thigh Lobules  Face/neck   x x  x       (Blank rows =  not tested)    LYMPHEDEMA LIFE IMPACT SCALE (LLIS): TBA OT Rx visit 1  TREATMENT DATE:  Pt edu for lymphedema progress towards goals and self-care   PATIENT EDUCATION:  Continued Pt/ CG edu for lymphedema self care home program throughout session. Topics include outcome of comparative limb volumetrics- starting limb volume differentials (LVDs), technology and gradient techniques used for short stretch, multilayer compression wrapping, simple self-MLD, therapeutic lymphatic pumping exercises, skin/nail care, LE precautions, compression garment recommendations and specifications, wear and care schedule and compression garment donning / doffing w assistive devices. Discussed progress towards all OT goals since commencing CDT. Discussed detrimental impact of obesity on lower and upper extremity lymphedema over time. Reviewed OT goals for lymphedema care with Pt and discussed progress to date.  All questions answered to the Pt's satisfaction. Good return. Person educated: Patient  Education method: Explanation, Demonstration, and Handouts Education comprehension: verbalized understanding, returned demonstration, verbal cues required, and needs further education   HOME EXERCISE PROGRAM: BLE lymphatic pumping there ex using- 1 sets of 10 reps, each exercise in order-  1-2 x daily, bilaterally Simple self MLD 1 x daily Daily skin care to increase hydration, skin mobility and decrease infection risk- can be done during MLD During Intensive Phase CDT: Compression wraps 23/7 until limb volume reduction complete During Self-management Phase CDT: Fit with appropriate compression garments or alternatives. Consider BLE, knee length, Mediven, custom, CircAid , Velcro style leggings over soft cotton liners.  ASSESSMENT: CLINICAL IMPRESSION: Emphasis of session on manual therapy to LLE today. Provided MLD to LLE utilizing functional inguinal LN, and typical non-cancer related lymphedema  sequences. Pt tolerated fibrosis techniques to dense fibrosis at medial distal thigh without increased pain. Pt continuously slid down on Rx plinth into sacral sitting throughout session, most likely due to hamstring tightness. Next session open hip angle of bed and position Pt w pillows under slightly bent knees to reduce strain on lower back and to support optimal positioning for MLD. Cont as per POC.  Today we continued teaching Pt how don and gauge the adjustable, CircAid, compression , knee length, legging. After multiple attempts and frequent repositioning sitting on the side of our firm, stable, Rx bed, positioning the R foot and leg on a footstool, and using a reacher Pt is able to position CircAid garment in correct posit on the R leg where she is able to reach it to gauge the compression tabs to achieve 30-40 mmHg on her lowe leg from ankle to popliteal fossa. This took out full hour long session, but with practice I predict Pt will be able to reduce time            dramatically. Cont teaching this skill until Pt feels confident that she is able to complete this task effectively and in a timely way. We applied compression wraps to the LLE today below the knee. Cont as per POC.   (INITIAL EVAL 07/12/23: Fortune Graylin Pizza is a 73 yo female presenting with chronic, progressive, BLE swelling 2/2 unknown etiology, which at first glance appears to be circulatory in nature due to skin coloring at distal legs where swelling is most invested. Pt is presently unable to wear traditional elastic compression garments due to difficulty donning and doffing them.  Chronic, progressive lymphedema with associated skin changes, including fibrosis, limits this patent's functional performance in all occupational domains, including functional ambulation and mobility, basic and instrumental ADLs (lower body dressing, LB bathing, fitting street shoes and LB clothing, driving, shopping, and  home management. It also  limits perform productive activities, leisure pursuits, and participation in social and community activities. BLE lymphedema contributes to elevated infection risk. Pt will benefit from skilled OT for Complete Decongestive Therapy (CDT), which  typically includes manual lymphatic drainage (MLD), skin care to limit' infection risk and increase skin excursion, lymphatic pumping exercise, and during the Intensive Phase multilayer, gradient compression bandaging to reduce limb volume. Once limb volume is reduced as much as possible, Pt is fitted with appropriate compression garments and/ or devices and transitions into the self management phase of care consisting of follow long support PRN.    Pt understands that her fair prognosis will become poor without daily assistance with compression wrapping since she is unable to reach feet and legs to apply them herself. Pt assures OT that she has a friend she believes is willing to assist her between OT sessions. )   OBJECTIVE IMPAIRMENTS: decreased standing and walking tolerance, activity tolerance, decreased balance, decreased knowledge of condition, decreased knowledge of use of DME, decreased mobility, decreased ROM, decreased strength, increased edema, impaired flexibility, impaired sensation, impaired UE functional use, impaired vision/reception, pain, and chronic, progressive, BLE swelling and associated skin changes, at increased infection risk   ACTIVITY LIMITATIONS: Functional ambulation and mobility (lifting, carrying, steps/stairs, transfers, squatting) , Basic and instrumental ADLs, leisure pursuits, productive activities, social participation  PARTICIPATION LIMITATIONS: meal prep, cleaning, laundry, driving, shopping, yard work, and    PERSONAL FACTORS: Age, Fitness, Past/current experiences, Time since onset of injury/illness/exacerbation, 2+ co morbidities: OSA, HTN,  are also affecting patient's functional outcome.   REHAB POTENTIAL:  Good  EVALUATION COMPLEXITY: Moderate   GOALS: Goals reviewed with patient? Yes  SHORT TERM GOALS: Target date: 4th OT Rx visit   Pt will demonstrate understanding of lymphedema precautions and prevention strategies with modified independence using a printed reference to identify at least 5 precautions and discussing how s/he may implement them into daily life to reduce risk of progression with extra time. Baseline:Max A Goal status: GOAL MET  2.  Pt will be able to apply multilayer, knee length, gradient, compression wraps to one leg at a time from toes to below knee with max caregiver assist to decrease limb volume, to limit infection risk, and to limit lymphedema progression.  Baseline: Dependent Goal status: GOAL MET  LONG TERM GOALS: Target date: 09/19/23  1.Given this patient's Intake score of TBA % on the Lymphedema Life Impact Scale (LLIS), patient will experience a reduction of at least 5 points in her perceived level of functional impairment resulting from lymphedema to improve functional performance and quality of life (QOL). Baseline: TBA % Goal status:  GOAL DEFERRED  2.  Pt will achieve at least a 10% volume reduction in B legs to return limb to typical size and shape, to limit infection risk and LE progression, to decrease pain, to improve function. Baseline: Dependent Goal status: PROGRESSING. R LEG volume reduced by 14.7% since initially measured on 07/18/21  3.  Pt will obtain appropriate compression garments/devices and achieve modified independence (extra time + assistive devices) with donning/doffing to optimize limb volume reductions and limit LE progression over time. Baseline: Dependent Goal status: PROGRESSING. Pt obtained R ession garment alternative and independence with donning and doffing at home is increasing with practice.  During Intensive phase CDT, with max CG assistance, Pt will achieve at least 85% compliance with all adapted lymphedema self-care home  program components, including daily skin care, compression wraps and /or garments, simple self  MLD and lymphatic pumping therex to habituate LE self care protocol  into ADLs for optimal LE self-management over time. Baseline: Dependent Goal status: GOAL MET  PLAN:  OT FREQUENCY: 2 x/week  OT DURATION: 12 weeks  PLANNED INTERVENTIONS:compression bandaging, skin care,  97110-Therapeutic exercises, 97530- Therapeutic activity, 97535- Self Care, Manual lymph drainage, DME instructions, and fit with appropriate compression  PLAN FOR NEXT SESSION:  Measure R leg to assess progress towards a stocking, or some alternative Cont Pt edu for LE self-care   Zebedee Dec, MS, OTR/L, CLT-LANA 11/23/23 1:09 PM

## 2023-11-24 ENCOUNTER — Ambulatory Visit: Admitting: Internal Medicine

## 2023-11-27 ENCOUNTER — Ambulatory Visit: Admitting: Occupational Therapy

## 2023-11-27 ENCOUNTER — Ambulatory Visit

## 2023-11-27 DIAGNOSIS — R262 Difficulty in walking, not elsewhere classified: Secondary | ICD-10-CM | POA: Diagnosis not present

## 2023-11-27 DIAGNOSIS — M25551 Pain in right hip: Secondary | ICD-10-CM

## 2023-11-27 DIAGNOSIS — R278 Other lack of coordination: Secondary | ICD-10-CM

## 2023-11-27 DIAGNOSIS — M5459 Other low back pain: Secondary | ICD-10-CM

## 2023-11-27 DIAGNOSIS — I890 Lymphedema, not elsewhere classified: Secondary | ICD-10-CM | POA: Diagnosis not present

## 2023-11-27 DIAGNOSIS — M6281 Muscle weakness (generalized): Secondary | ICD-10-CM | POA: Diagnosis not present

## 2023-11-27 NOTE — Therapy (Signed)
 OUTPATIENT OCCUPATIONAL THERAPY TREATMENT NOTE   BILATERAL LOWER EXTREMITY LYMPHEDEMA  Patient Name: Judith Arnold MRN: [id number] DOB:September 08, 1950, 73 y.o., female Today's Date: 11/27/2023  REPORTING PERIOD: END OF SESSION:   OT End of Session - 11/27/23 1318     Visit Number 34    Number of Visits 36    Date for OT Re-Evaluation 01/16/24    OT Start Time 0102    OT Stop Time 0206    OT Time Calculation (min) 64 min    Equipment Utilized During Treatment sock donner; footstool    Activity Tolerance Patient tolerated treatment well;No increased pain    Behavior During Therapy WFL for tasks assessed/performed           Past Medical History:  Diagnosis Date   Allergy    Arthritis    lower back, left shoulder   Asthma    Carpal tunnel syndrome on both sides    Dental bridge present    top   Dental crown present    multiple   Family history of adverse reaction to anesthesia    Father and sister - PONV   GERD (gastroesophageal reflux disease)    Glaucoma    Hypertension    Leaky heart valve    Pre-diabetes    Sleep apnea    Past Surgical History:  Procedure Laterality Date   CATARACT EXTRACTION W/PHACO Right 11/16/2016   Procedure: CATARACT EXTRACTION PHACO AND INTRAOCULAR LENS PLACEMENT (IOC)  right;  Surgeon: Mittie Gaskin, MD;  Location: [facility];  Service: Ophthalmology;  Laterality: Right;  pre diabetic latex sensitivity sleep apnea   CATARACT EXTRACTION W/PHACO Left 01/18/2017   Procedure: CATARACT EXTRACTION PHACO AND INTRAOCULAR LENS PLACEMENT (IOC) LEFT DIABETIC;  Surgeon: Mittie Gaskin, MD;  Location: [facility];  Service: Ophthalmology;  Laterality: Left;   COLONOSCOPY     COLONOSCOPY WITH PROPOFOL  N/A 04/09/2018   Procedure: COLONOSCOPY WITH PROPOFOL ;  Surgeon: Viktoria Lamar DASEN, MD;  Location: [facility] END[facility];  Service: Endoscopy;  Laterality: N/A;   IRIDOTOMY / IRIDECTOMY Bilateral 2005   TOOTH EXTRACTION     Patient  Active Problem List   Diagnosis Date Noted   Chronic bilateral low back pain with right-sided sciatica 09/29/2023   Hypertension associated with diabetes (HCC) 01/20/2023   Combined hyperlipidemia associated with type 2 diabetes mellitus (HCC) 01/20/2023   Intertrigo 11/01/2022   Stasis dermatitis of both legs 10/21/2022   Leg swelling 10/21/2022   Lymphedema of lower extremity 10/21/2022   Type 2 diabetes mellitus without complication, without long-term current use of insulin (HCC) 05/24/2022   Mixed hyperlipidemia 05/24/2022   Essential hypertension, benign 05/24/2022   Absolute anemia 05/24/2022   Gastroesophageal reflux disease without esophagitis 05/24/2022   Allergic rhinitis 05/24/2022   Asthma, allergic, moderate persistent, uncomplicated 05/24/2022   Bilateral chronic angle-closure glaucoma, indeterminate stage 05/24/2022   Airway hyperreactivity 08/29/2013   OSA on CPAP 08/29/2013    PCP: Fredy GORMAN Bathe, MD  REFERRING PROVIDER: same  REFERRING DIAG: lymphedema I89.0  THERAPY DIAG:  Lymphedema, not elsewhere classified  Rationale for Evaluation and Treatment: Rehabilitation  ONSET DATE: Pt reports onset of L ankle / leg swelling after injury 20-30 yrs ago. Then about 1 year ago legs started swelling and Pt having prickly sensation on shins and thighs bilaterally.   SUBJECTIVE:  SUBJECTIVE STATEMENT: Judith Arnold presents to OT for treatment of BLE lymphedema 2/2 suspected CVI and obesity. Pt is unaccompanied today. Pt reports 5/10 in her lower back. My legs are not hurting, no.   (INITIAL EVAL 07/12/23 : Judith Arnold is referred to Occupational Therapy by Fredy GORMAN Bathe, MD,  for evaluation and treatment of BLE lymphedema.  Pt reports onset of L ankle and distal leg  swelling about 30 years ago, then noticed RLE swelling and worsening LLE swelling about 1 year ago at same time she started having unusual sensation in legs. Pt denies previously having lymphedema treatment. Pt reports paternal aunt had leg swelling. Pt is unable to wear compression stockings because she is unable to reach feet and distal legs to don and doff them due to back and hip pain.  PERTINENT HISTORY: Relevant to lymphedema (OSA (denies using CPAP), BLE stasis dermatitis, HTN, DM 2 (Pt states she is prediabetic only), B glaucoma, Back and R hip OA  PAIN:  Are you having LE-related pain? Yes: NPRS scale: 0/10 Pain location: medial proximal thighs w abduction, bilateral groin, buttocks Pain description: like stickers Aggravating factors: standing, walking, dependent sitting Relieving factors: elevation  PRECAUTIONS: Fall and Other: LYMPHEDEMA precautions  (DM 2 and asthma)  WEIGHT BEARING RESTRICTIONS: No  FALLS:  Has patient fallen in last 6 months? yes Fell asleep at the computer and fell onto the floor [place] when storm door hit me  LIVING ENVIRONMENT: Lives with: alone Lives in: House/apartment Stairs: No;  Has following equipment at home: Environmental consultant - 4 wheeled  OCCUPATION: retired Scientist, research (life sciences)  LEISURE: reading and taking notes on current events and politics  HAND DOMINANCE: right   PRIOR LEVEL OF FUNCTION: Independent with household mobility with device, Requires assistive device for independence, Needs assistance with ADLs, Needs assistance with homemaking, and Needs assistance with gait  PATIENT GOALS: reduce swelling and keep it from getting worse  OBJECTIVE: Note: Objective measures were completed at Evaluation unless otherwise noted.  COGNITION:  Overall cognitive status: impaired short term  memory   OBSERVATIONS / OTHER ASSESSMENTS: Mild, Stage II, BLE Lymphedema 2/2 suspected venous insufficiency and hx of obesity   SENSATION: Reports  uncomfortable scratching, or sticker-like  sensation on anterior thighs   POSTURE: Raised walker handles 2 notches to increase upright spinal alignment when walking to limit falls risk. Handles need to be raised at least 1 hole more for safe upright posture when walking.  Upright sitting posture: head forward , shoulders forward and rounded- appears to be flexible kyphosis  LE ROM: WFL for ankles and knees. Pt reports limited hip abduction  LE MMT: [facility]. Presenting with generalized weakness and debility  LYMPHEDEMA ASSESSMENTS:   SURGERY TYPE/DATE: Non-cancer related limb swelling  HX INFECTIONS: positive for 1 episode cellulitis treated w antibiotics  Hx WOUNDS: denies   BLE COMPARATIVE LIMB VOLUMETRICS: INITIAL 07/19/23  LANDMARK RIGHT (dominant)  R LEG (A-D) 3525.1 ml  R THIGH (E-G) ml  R FULL LIMB (A-G) ml  Limb Volume differential (LVD)  %  Volume change since initial %  Volume change overall V  (Blank rows = not tested)  LANDMARK LEFT    L LEG (A-D) 3769.6 ml  L THIGH (E-G) ml  L FULL LIMB (A-G) ml  Limb Volume differential (LVD)  Limb Volume Differential (LVD) measures 6.5%, L>R.  Volume change since initial %  Volume change overall %    RLE COMPARATIVE LIMB VOLUMETRICS: VISIT 9 08/15/23  LANDMARK RIGHT (dominant)  R LEG (A-D) 3549.4 ml  R THIGH (E-G) ml  R FULL LIMB (A-G) ml  Limb Volume differential (LVD)  %  Volume change since initial R LEG volume reduced by 5.84% since initially measured on 07/18/21  Volume change overall V  (Blank rows = not tested)  RLE COMPARATIVE LIMB VOLUMETRICS: VISIT 19 10/03/23  LANDMARK RIGHT (dominant)  R LEG (A-D) 3549.4 ml  R THIGH (E-G) ml  R FULL LIMB (A-G) ml  Limb Volume differential (LVD)  %  Volume change since initial R LEG volume reduced by 14.7% since initially measured on 07/18/21  Volume change overall V    BLE COMPARATIVE LIMB VOLUMETRICS: 29th Visit  LANDMARK RIGHT (dominant)  R LEG (A-D)  3438.1 ml  R THIGH (E-G) ml  R FULL LIMB (A-G) ml  Limb Volume differential (LVD)  %  Volume change since last measured on 10/03/23 (19th visit) DECREASED by 0.67%. R Leg limb volume stable.  Volume change overall Overall R Leg volume reduction = 2.5%. Goal not me to date  (Blank rows = not tested)  LANDMARK LEFT    L LEG (A-D) 4174.4  ml  L THIGH (E-G) ml  L FULL LIMB (A-G) ml  Limb Volume differential (LVD)  .  Volume change since initial on 07/19/23 L LEG volume INCREASED by 10.7% since initially measured on 07/19/23  Volume change overall L LEG volume INCREASED by 10.7& since initially measured on 07/19/23  (Blank rows = not tested)  Mild, Stage  II, Bilateral Lower Extremity Lymphedema 2/2 CVI and Obesity  Skin  Description Hyper-Keratosis Peau' de Orange Shiny Tight Fibrotic/ Indurated Fatty Doughy Spongy/ boggy       R>L x  x   Skin dry Flaky WNL Macerated   mildly      Color Redness Varicosities Blanching Hemosiderin Stain Mottled   x     x   Odor Malodorous Yeast Fungal infection  WNL      x   Temperature Warm Cool wnl    x     Pitting Edema   1+ 2+ 3+ 4+ Non-pitting         x   Girth Symmetrical Asymmetrical                   Distribution    R>L toes to groin    Stemmer Sign Positive Negative   +    Lymphorrhea History Of:  Present Absent     x    Wounds History Of Present Absent Venous Arterial Pressure Sheer     x        Signs of Infection Redness Warmth Erythema Acute Swelling Drainage Borders                    Sensation Light Touch Deep pressure Hypersensitivity   Present Impaired Present Impaired Absent Impaired   x Tactile  x  x     Nails WNL   Fungus nail dystrophy   x     Hair Growth Symmetrical Asymmetrical   x    Skin Creases Base of toes  Ankles   Base of Fingers knees       Abdominal pannus Thigh Lobules  Face/neck   x x  x      (Blank rows = not tested)    LYMPHEDEMA LIFE IMPACT SCALE (LLIS): TBA OT Rx visit  1  TREATMENT DATE:  Pt edu for lymphedema progress towards goals and self-care  PATIENT EDUCATION:  Continued Pt/ CG edu for lymphedema self care home program throughout session. Topics include outcome of comparative limb volumetrics- starting limb volume differentials (LVDs), technology and gradient techniques used for short stretch, multilayer compression wrapping, simple self-MLD, therapeutic lymphatic pumping exercises, skin/nail care, LE precautions, compression garment recommendations and specifications, wear and care schedule and compression garment donning / doffing w assistive devices. Discussed progress towards all OT goals since commencing CDT. Discussed detrimental impact of obesity on lower and upper extremity lymphedema over time. Reviewed OT goals for lymphedema care with Pt and discussed progress to date.  All questions answered to the Pt's satisfaction. Good return. Person educated: Patient  Education method: Explanation, Demonstration, and Handouts Education comprehension: verbalized understanding, returned demonstration, verbal cues required, and needs further education   HOME EXERCISE PROGRAM: BLE lymphatic pumping there ex using- 1 sets of 10 reps, each exercise in order-  1-2 x daily, bilaterally Simple self MLD 1 x daily Daily skin care to increase hydration, skin mobility and decrease infection risk- can be done during MLD During Intensive Phase CDT: Compression wraps 23/7 until limb volume reduction complete During Self-management Phase CDT: Fit with appropriate compression garments or alternatives. Consider BLE, knee length, Mediven, custom, CircAid , Velcro style leggings over soft cotton liners.  ASSESSMENT: CLINICAL IMPRESSION: Emphasis of session on manual therapy to LLE today. Provided MLD to LLE utilizing functional inguinal LN, and typical non-cancer related lymphedema sequences. Pt tolerated fibrosis techniques to dense fibrosis at medial distal thigh without  increased pain. We applied compression wraps to the LLE today below the knee and CircAid to R leg. Cont as per POC.   (INITIAL EVAL 07/12/23: Judith Arnold is a 73 yo female presenting with chronic, progressive, BLE swelling 2/2 unknown etiology, which at first glance appears to be circulatory in nature due to skin coloring at distal legs where swelling is most invested. Pt is presently unable to wear traditional elastic compression garments due to difficulty donning and doffing them.  Chronic, progressive lymphedema with associated skin changes, including fibrosis, limits this patent's functional performance in all occupational domains, including functional ambulation and mobility, basic and instrumental ADLs (lower body dressing, LB bathing, fitting street shoes and LB clothing, driving, shopping, and home management. It also limits perform productive activities, leisure pursuits, and participation in social and community activities. BLE lymphedema contributes to elevated infection risk. Pt will benefit from skilled OT for Complete Decongestive Therapy (CDT), which  typically includes manual lymphatic drainage (MLD), skin care to limit' infection risk and increase skin excursion, lymphatic pumping exercise, and during the Intensive Phase multilayer, gradient compression bandaging to reduce limb volume. Once limb volume is reduced as much as possible, Pt is fitted with appropriate compression garments and/ or devices and transitions into the self management phase of care consisting of follow long support PRN.    Pt understands that her fair prognosis will become poor without daily assistance with compression wrapping since she is unable to reach feet and legs to apply them herself. Pt assures OT that she has a friend she believes is willing to assist her between OT sessions. )   OBJECTIVE IMPAIRMENTS: decreased standing and walking tolerance, activity tolerance, decreased balance, decreased  knowledge of condition, decreased knowledge of use of DME, decreased mobility, decreased ROM, decreased strength, increased edema, impaired flexibility, impaired sensation, impaired UE functional use, impaired vision/reception, pain, and chronic, progressive, BLE swelling and associated skin changes, at increased infection risk   ACTIVITY LIMITATIONS: Functional ambulation  and mobility (lifting, carrying, steps/stairs, transfers, squatting) , Basic and instrumental ADLs, leisure pursuits, productive activities, social participation  PARTICIPATION LIMITATIONS: meal prep, cleaning, laundry, driving, shopping, yard work, and    PERSONAL FACTORS: Age, Fitness, Past/current experiences, Time since onset of injury/illness/exacerbation, 2+ co morbidities: OSA, HTN,  are also affecting patient's functional outcome.   REHAB POTENTIAL: Good  EVALUATION COMPLEXITY: Moderate   GOALS: Goals reviewed with patient? Yes  SHORT TERM GOALS: Target date: 4th OT Rx visit   Pt will demonstrate understanding of lymphedema precautions and prevention strategies with modified independence using a printed reference to identify at least 5 precautions and discussing how s/he may implement them into daily life to reduce risk of progression with extra time. Baseline:Max A Goal status: GOAL MET  2.  Pt will be able to apply multilayer, knee length, gradient, compression wraps to one leg at a time from toes to below knee with max caregiver assist to decrease limb volume, to limit infection risk, and to limit lymphedema progression.  Baseline: Dependent Goal status: GOAL MET  LONG TERM GOALS: Target date: 09/19/23  1.Given this patient's Intake score of TBA % on the Lymphedema Life Impact Scale (LLIS), patient will experience a reduction of at least 5 points in her perceived level of functional impairment resulting from lymphedema to improve functional performance and quality of life (QOL). Baseline: TBA % Goal status:   GOAL DEFERRED  2.  Pt will achieve at least a 10% volume reduction in B legs to return limb to typical size and shape, to limit infection risk and LE progression, to decrease pain, to improve function. Baseline: Dependent Goal status: PROGRESSING. R LEG volume reduced by 14.7% since initially measured on 07/18/21  3.  Pt will obtain appropriate compression garments/devices and achieve modified independence (extra time + assistive devices) with donning/doffing to optimize limb volume reductions and limit LE progression over time. Baseline: Dependent Goal status: PROGRESSING. Pt obtained R ession garment alternative and independence with donning and doffing at home is increasing with practice.  During Intensive phase CDT, with max CG assistance, Pt will achieve at least 85% compliance with all adapted lymphedema self-care home program components, including daily skin care, compression wraps and /or garments, simple self MLD and lymphatic pumping therex to habituate LE self care protocol  into ADLs for optimal LE self-management over time. Baseline: Dependent Goal status: GOAL MET  PLAN:  OT FREQUENCY: 2 x/week  OT DURATION: 12 weeks  PLANNED INTERVENTIONS:compression bandaging, skin care,  97110-Therapeutic exercises, 97530- Therapeutic activity, 97535- Self Care, Manual lymph drainage, DME instructions, and fit with appropriate compression  PLAN FOR NEXT SESSION:  Measure R leg to assess progress towards a stocking, or some alternative Cont Pt edu for LE self-care   Zebedee Dec, MS, OTR/L, CLT-LANA 11/27/23 2:09 PM

## 2023-11-27 NOTE — Therapy (Signed)
 OUTPATIENT PHYSICAL THERAPY THORACOLUMBAR TREATMENT   Patient Name: Judith Arnold MRN: [id number] DOB:April 15, 1950, 73 y.o., female Today's Date: 11/27/2023  END OF SESSION:  PT End of Session - 11/27/23 1406     Visit Number 16    Number of Visits 25    Date for PT Re-Evaluation 12/12/23    Progress Note Due on Visit 20    PT Start Time 1405    PT Stop Time 1447    PT Time Calculation (min) 42 min    Equipment Utilized During Treatment Gait belt    Activity Tolerance Patient tolerated treatment well;Patient limited by pain    Behavior During Therapy WFL for tasks assessed/performed                    Past Medical History:  Diagnosis Date   Allergy    Arthritis    lower back, left shoulder   Asthma    Carpal tunnel syndrome on both sides    Dental bridge present    top   Dental crown present    multiple   Family history of adverse reaction to anesthesia    Father and sister - PONV   GERD (gastroesophageal reflux disease)    Glaucoma    Hypertension    Leaky heart valve    Pre-diabetes    Sleep apnea    Past Surgical History:  Procedure Laterality Date   CATARACT EXTRACTION W/PHACO Right 11/16/2016   Procedure: CATARACT EXTRACTION PHACO AND INTRAOCULAR LENS PLACEMENT (IOC)  right;  Surgeon: Mittie Gaskin, MD;  Location: [facility];  Service: Ophthalmology;  Laterality: Right;  pre diabetic latex sensitivity sleep apnea   CATARACT EXTRACTION W/PHACO Left 01/18/2017   Procedure: CATARACT EXTRACTION PHACO AND INTRAOCULAR LENS PLACEMENT (IOC) LEFT DIABETIC;  Surgeon: Mittie Gaskin, MD;  Location: [facility];  Service: Ophthalmology;  Laterality: Left;   COLONOSCOPY     COLONOSCOPY WITH PROPOFOL  N/A 04/09/2018   Procedure: COLONOSCOPY WITH PROPOFOL ;  Surgeon: Viktoria Lamar DASEN, MD;  Location: [facility];  Service: Endoscopy;  Laterality: N/A;   IRIDOTOMY / IRIDECTOMY Bilateral 2005   TOOTH EXTRACTION     Patient Active  Problem List   Diagnosis Date Noted   Chronic bilateral low back pain with right-sided sciatica 09/29/2023   Hypertension associated with diabetes (HCC) 01/20/2023   Combined hyperlipidemia associated with type 2 diabetes mellitus (HCC) 01/20/2023   Intertrigo 11/01/2022   Stasis dermatitis of both legs 10/21/2022   Leg swelling 10/21/2022   Lymphedema of lower extremity 10/21/2022   Type 2 diabetes mellitus without complication, without long-term current use of insulin (HCC) 05/24/2022   Mixed hyperlipidemia 05/24/2022   Essential hypertension, benign 05/24/2022   Absolute anemia 05/24/2022   Gastroesophageal reflux disease without esophagitis 05/24/2022   Allergic rhinitis 05/24/2022   Asthma, allergic, moderate persistent, uncomplicated 05/24/2022   Bilateral chronic angle-closure glaucoma, indeterminate stage 05/24/2022   Airway hyperreactivity 08/29/2013   OSA on CPAP 08/29/2013    PCP: Fernand Fredy RAMAN, MD  REFERRING PROVIDER: Fernand Fredy, RAMAN, MD  REFERRING DIAG:  Diagnosis  G89.29,M54.41 (ICD-10-CM) - Chronic bilateral low back pain with right-sided sciatica    Rationale for Evaluation and Treatment: Rehabilitation  THERAPY DIAG:  Other low back pain  Difficulty in walking, not elsewhere classified  Pain in right hip  Muscle weakness (generalized)  Other lack of coordination  ONSET DATE: >3 years   SUBJECTIVE:  SUBJECTIVE STATEMENT:  Today: Patient reports waking up with more right hip pain than previous sessions. States hip pain is making walking more difficult today.      Patient reports 6/10 Right hip pain today.   From EVAL: Pt is a pleasant 73 y/o female presenting to PT eval for chronic LBP with R side hip pain. She reports onset of LBP over 3 years ago. Primary site of  pain is mostly felt in R hip and lower spine. Pt reports hx of arthritis in lower back and R hip. She is now starting to feel arthritis pain in her knees as well. Reports RLE is actually her good leg, has some issues with pain into groin region of LLE. Pt states strength in legs has been impacted and that her general mobility is worse. Pt can only ambulate comfortably by taking weight off of lower back by using her 4WW. She states not using 4WW so much for balance, but for reducing pain with gait. She is unable to stand up fully due to pain. Pt says she used to be very fit (took take karate, gymnastics years ago). She is also seeing OT for BLE lymphedema management. She endorses difficulty getting out of chairs, difficulty with steps, she must use a ramp to get in and out of her home. Pt reports my brain does not retain what I'm talking about, can lose train of thought.  PERTINENT HISTORY:  PMH per chart includes arthritis low back and left shoulder, bilateral carpal tunnel, asthma, glaucoma, HTN, leaky heart valve, sleep apnea, pre-diabetes, being seen by lymphedema specialist for BLE lymphedema   PAIN:  Are you having pain? LBP and R hip pain  Lowest pain level: 0/10  With standing: 2/10  Worst: 5-6/10   PRECAUTIONS: LATEX ALLERGY per chart, fall  RED FLAGS: None   WEIGHT BEARING RESTRICTIONS: No  FALLS:  Has patient fallen in last 6 months? No  LIVING ENVIRONMENT: Uses ramp to get into home due to difficulty with steps, has 4WW  PLOF: Independent  PATIENT GOALS:  Says due to back pain she is unable to reach my feet, has difficulty getting her socks on without a grabber, wants to be more flexible and stronger to increase ease with these activities and mobility    OBJECTIVE:  Note: Objective measures were completed at Evaluation unless otherwise noted.  DIAGNOSTIC FINDINGS:  No recent pertinent imaging available in chart  PATIENT SURVEYS:  Modified Oswestry score:  34%  Interpretation of scores: Score Category Description  0-20% Minimal Disability The patient can cope with most living activities. Usually no treatment is indicated apart from advice on lifting, sitting and exercise  21-40% Moderate Disability The patient experiences more pain and difficulty with sitting, lifting and standing. Travel and social life are more difficult and they may be disabled from work. Personal care, sexual activity and sleeping are not grossly affected, and the patient can usually be managed by conservative means  41-60% Severe Disability Pain remains the main problem in this group, but activities of daily living are affected. These patients require a detailed investigation  61-80% Crippled Back pain impinges on all aspects of the patient's life. Positive intervention is required  81-100% Bed-bound  These patients are either bed-bound or exaggerating their symptoms  Bluford FORBES Zoe DELENA Karon DELENA, et al. Surgery versus conservative management of stable thoracolumbar fracture: the PRESTO feasibility RCT. [place] ([place]): [employer]; 2021 Nov. [facility] Technology Assessment, No. 25.62.) Appendix 3, Oswestry Disability Index category  descriptors. Available from: [email or web address]cz  Minimally Clinically Important Difference (MCID) = 12.8%  COGNITION: Overall cognitive status: Within functional limits for tasks assessed, very pleasant pt can become tangential and requires redirection to activity    SENSATION: Intact to light touch BLE   MUSCLE LENGTH: deferred  POSTURE: increased thoracic kyphosis, rounded shoulders, unable to achieve full upright position due to pain, maintains increased hip and knee flexion when attempting to stand fully upright   PALPATION: deferred  Thoracolumbar AROM: Extension - unable to achieve neutral position, remains in significant flexion  Flexion - lacking at least 25%  Rotation - lacking at least 40%  bilat  and pain-limited    LOWER EXTREMITY MMT:    MMT Right eval Left eval  Hip flexion 4- 3*  Hip extension    Hip abduction 4+ 4+  Hip adduction 4+ 4+  Hip internal rotation    Hip external rotation    Knee flexion 4 3*  Knee extension 4+ 4+  Ankle dorsiflexion 4 4+  Ankle plantarflexion    Ankle inversion    Ankle eversion     (Blank rows = not tested) *=pain limited   LUMBAR SPECIAL TESTS:  deferred  FUNCTIONAL TESTS:  10 meter walk test: 0.63 m/s crouched posture, decreased hip extension, decreased step-length and heel strike, elevated R>L shoulder, heavy BUE weightbearing on 4WW  GAIT: Distance walked: 10MWT/clinic distances (see above) Assistive device utilized: Environmental consultant - 4 wheeled Level of assistance: Modified independence Comments: gait mechanics impaired: decreased gait speed, crouched posture, decreased hip extension, decreased step-length and heel strike, elevated R>L shoulder, heavy BUE weightbearing on 4WW  TREATMENT DATE: 11/27/23      TA:  Ambulation to restroom (from gym as upon arrival from OT stated needing to use restroom but prefers bathroom with better railing) x approx 300 feet- VC for step length. *Patient reports difficult due to some increased right hip pain - rates at a 6/10 today.  Step tap onto 1st step at stairs with BUE support 2 x 10 reps (difficulty with weight bearing through RLE to lift LLE-  Seated step tap 4# on LLE and no weight on right 2 x 10 reps  TE:  Seated LAQ 4# LLE and no weight on RLE- 2x 10 reps  Standing calf raises x 12 reps with BUE on railings 2 x 10    NMR- For improved postural awareness *latex free RTB - scap rows 2 x 10- VC and visual demo for correct form. *latex free RTB- Horizontal Shoulder ABD 2 x 10  *provided RTB for use in home                                                                                               PATIENT EDUCATION:  Education details: Provided education on findings of  exam, indications for plan, prognoses, goals, how PT can help                 Person educated: Patient Education method: Explanation Education comprehension: verbalized understanding  HOME EXERCISE PROGRAM: Access Code: [email or web address] URL: [email or web address] Date: 10/05/2023 Prepared by: Lonni Gainer  Exercises - Supine Bridge  - 3 x weekly - 3 sets - 10 reps - Clamshell  - 3 x weekly - 3 sets - 10 reps - Supine Lower Trunk Rotation  - 1 x daily - 3 sets - 30 sec hold - Seated Hamstring Stretch  - 1 x daily - 3 sets - 30 hold - Seated Clamshell  - 1 x daily - 7 x weekly - 2 sets - 20 reps - 3 sec hold - Supine Transversus Abdominis Bracing - Hands on Stomach  - 3 x weekly - 3 sets - 10 reps - Supine Gluteal Sets  - 3 x weekly - 3 sets - 10 reps  ASSESSMENT:  CLINICAL IMPRESSION:  Treatment limited today secondary to increased overall complaint of R hip pain. Patient was able to walk some with use of walker - continues with forward flexed posture so added some postural activities today with no report of any increased pain. Continuing on path of promoting functional movement strategies vs. Traditional core stabilization/Low back stretching and patient continues to respond better to this approach.  Pt will continue to benefit from skilled physical therapy intervention to address impairments, improve QOL, and attain therapy goals.    OBJECTIVE IMPAIRMENTS: Abnormal gait, decreased activity tolerance, decreased balance, decreased mobility, difficulty walking, decreased strength, hypomobility, increased edema, improper body mechanics, postural dysfunction, and pain.   ACTIVITY LIMITATIONS: carrying, lifting, bending, squatting, stairs, transfers, bed mobility, and locomotion level  PARTICIPATION LIMITATIONS: meal prep, cleaning, shopping, community activity, and yard work  PERSONAL FACTORS: Age, Fitness, Sex, Time since onset of injury/illness/exacerbation, and 3+  comorbidities: PMH per chart includes arthritis low back and left shoulder, bilateral carpal tunnel, asthma, glaucoma, HTN, leaky heart valve, sleep apnea, pre-diabetes, being seen by lymphedema specialist for BLE lymphedema  are also affecting patient's functional outcome.   REHAB POTENTIAL: Good  CLINICAL DECISION MAKING: Evolving/moderate complexity  EVALUATION COMPLEXITY: Moderate   GOALS: Goals reviewed with patient? Yes   SHORT TERM GOALS: Target date: 10/31/2023    Patient will be independent in home exercise program to improve strength/mobility for better functional independence with ADLs. Baseline:to be initiated; 11/06/2023=Patient reports knowledgeable of HEP but not always able to perform and sometimes painful so has to modify.  Goal status: MET   LONG TERM GOALS: Target date: 12/12/2023    Patient will reduce modified Oswestry score to <20 as to demonstrate minimal disability with ADLs including improved sleeping tolerance, walking/sitting tolerance etc for better mobility with ADLs.  Baseline: 34; 11/06/2023= 32 Goal status: INITIAL  2.  Patient (> 47 years old) will complete five times sit to stand test in < 15 seconds indicating an increased LE strength and improved balance. Baseline: 09/26/2023= 26.46 sec with heavy UE Support; 11/06/2023= 11/06/2023= 22.31 sec without UE Support Goal status: PROGRESSING  3.  Patient will increase Berg Balance score by > 6 points to demonstrate decreased fall risk during functional activities Baseline: 09/26/2023= 31/56; 11/06/2023= 40/56 Goal status: MET  4.  Patient will increase 10 meter walk test to >1.68m/s as to improve gait speed for better community ambulation and to reduce fall risk. Baseline:  0.63 m/s with 4WW; 0.74 m/s Goal status: PROGRESSING      PLAN:  PT FREQUENCY: 1-2x/week  PT DURATION: 12 weeks  PLANNED INTERVENTIONS: [id number]- PT Re-evaluation, 97750- Physical Performance Testing, 97110-Therapeutic exercises,  97530- Therapeutic activity, [id number]- Neuromuscular re-education, 97535- Self Care, [id number]- Manual therapy, [id number]- Gait training, [id number]- Orthotic Initial, [id number]- Orthotic/Prosthetic subsequent, [id number]- Canalith repositioning,  Patient/Family education, Balance training, Stair training, Taping, Joint mobilization, Spinal mobilization, Vestibular training, DME instructions, Cryotherapy, and Moist heat.  PLAN FOR NEXT SESSION:  Continue to progress functional mobility in standing and seated if needed  Continue with some static and dynamic balance activities as appropriate. Manual therapy for lumbar ROM/pain  Continue to progress HEP to activities that promote mobility with the least amount of pain as possible.    Reyes LOISE London, PT 11/27/2023, 4:45 PM

## 2023-11-29 ENCOUNTER — Ambulatory Visit: Admitting: Occupational Therapy

## 2023-11-29 ENCOUNTER — Ambulatory Visit

## 2023-11-29 DIAGNOSIS — M6281 Muscle weakness (generalized): Secondary | ICD-10-CM

## 2023-11-29 DIAGNOSIS — R278 Other lack of coordination: Secondary | ICD-10-CM

## 2023-11-29 DIAGNOSIS — M25551 Pain in right hip: Secondary | ICD-10-CM | POA: Diagnosis not present

## 2023-11-29 DIAGNOSIS — I890 Lymphedema, not elsewhere classified: Secondary | ICD-10-CM | POA: Diagnosis not present

## 2023-11-29 DIAGNOSIS — R262 Difficulty in walking, not elsewhere classified: Secondary | ICD-10-CM

## 2023-11-29 DIAGNOSIS — M5459 Other low back pain: Secondary | ICD-10-CM | POA: Diagnosis not present

## 2023-11-29 NOTE — Therapy (Signed)
 OUTPATIENT OCCUPATIONAL THERAPY TREATMENT NOTE   BILATERAL LOWER EXTREMITY LYMPHEDEMA  Patient Name: Judith Arnold MRN: [id number] DOB:[birth year], 73 y.o., female Today's Date: 11/29/2023  REPORTING PERIOD: END OF SESSION:   OT End of Session - 11/29/23 1409     Visit Number 35    Number of Visits 36    Date for OT Re-Evaluation 01/16/24    OT Start Time 0103    OT Stop Time 0209    OT Time Calculation (min) 66 min    Equipment Utilized During Treatment sock donner; footstool    Activity Tolerance Patient tolerated treatment well;No increased pain    Behavior During Therapy WFL for tasks assessed/performed           Past Medical History:  Diagnosis Date   Allergy    Arthritis    lower back, left shoulder   Asthma    Carpal tunnel syndrome on both sides    Dental bridge present    top   Dental crown present    multiple   Family history of adverse reaction to anesthesia    Father and sister - PONV   GERD (gastroesophageal reflux disease)    Glaucoma    Hypertension    Leaky heart valve    Pre-diabetes    Sleep apnea    Past Surgical History:  Procedure Laterality Date   CATARACT EXTRACTION W/PHACO Right 11/16/2016   Procedure: CATARACT EXTRACTION PHACO AND INTRAOCULAR LENS PLACEMENT (IOC)  right;  Surgeon: Mittie Gaskin, MD;  Location: [facility];  Service: Ophthalmology;  Laterality: Right;  pre diabetic latex sensitivity sleep apnea   CATARACT EXTRACTION W/PHACO Left 01/18/2017   Procedure: CATARACT EXTRACTION PHACO AND INTRAOCULAR LENS PLACEMENT (IOC) LEFT DIABETIC;  Surgeon: Mittie Gaskin, MD;  Location: [facility];  Service: Ophthalmology;  Laterality: Left;   COLONOSCOPY     COLONOSCOPY WITH PROPOFOL  N/A 04/09/2018   Procedure: COLONOSCOPY WITH PROPOFOL ;  Surgeon: Viktoria Lamar DASEN, MD;  Location: [facility];  Service: Endoscopy;  Laterality: N/A;   IRIDOTOMY / IRIDECTOMY Bilateral 2005   TOOTH EXTRACTION     Patient  Active Problem List   Diagnosis Date Noted   Chronic bilateral low back pain with right-sided sciatica 09/29/2023   Hypertension associated with diabetes (HCC) 01/20/2023   Combined hyperlipidemia associated with type 2 diabetes mellitus (HCC) 01/20/2023   Intertrigo 11/01/2022   Stasis dermatitis of both legs 10/21/2022   Leg swelling 10/21/2022   Lymphedema of lower extremity 10/21/2022   Type 2 diabetes mellitus without complication, without long-term current use of insulin (HCC) 05/24/2022   Mixed hyperlipidemia 05/24/2022   Essential hypertension, benign 05/24/2022   Absolute anemia 05/24/2022   Gastroesophageal reflux disease without esophagitis 05/24/2022   Allergic rhinitis 05/24/2022   Asthma, allergic, moderate persistent, uncomplicated 05/24/2022   Bilateral chronic angle-closure glaucoma, indeterminate stage 05/24/2022   Airway hyperreactivity 08/29/2013   OSA on CPAP 08/29/2013    PCP: Fredy GORMAN Bathe, MD  REFERRING PROVIDER: same  REFERRING DIAG: lymphedema I89.0  THERAPY DIAG:  Lymphedema, not elsewhere classified  Rationale for Evaluation and Treatment: Rehabilitation  ONSET DATE: Pt reports onset of L ankle / leg swelling after injury 20-30 yrs ago. Then about 1 year ago legs started swelling and Pt having prickly sensation on shins and thighs bilaterally.   SUBJECTIVE:  SUBJECTIVE STATEMENT: Judith Arnold presents to OT for treatment of BLE lymphedema 2/2 suspected CVI and obesity. Pt is unaccompanied today. Pt reports 5/10 in her lower back. My legs are not hurting. Pt has no new complaints. She has PT immediately after OT today.   (INITIAL EVAL 07/12/23 : Judith Arnold is referred to Occupational Therapy by Fredy GORMAN Bathe, MD,  for evaluation and treatment of BLE  lymphedema.  Pt reports onset of L ankle and distal leg swelling about 30 years ago, then noticed RLE swelling and worsening LLE swelling about 1 year ago at same time she started having unusual sensation in legs. Pt denies previously having lymphedema treatment. Pt reports paternal aunt had leg swelling. Pt is unable to wear compression stockings because she is unable to reach feet and distal legs to don and doff them due to back and hip pain.  PERTINENT HISTORY: Relevant to lymphedema (OSA (denies using CPAP), BLE stasis dermatitis, HTN, DM 2 (Pt states she is prediabetic only), B glaucoma, Back and R hip OA  PAIN:  Are you having LE-related pain? Yes: NPRS scale: 0/10 Pain location: medial proximal thighs w abduction, bilateral groin, buttocks Pain description: like stickers Aggravating factors: standing, walking, dependent sitting Relieving factors: elevation  PRECAUTIONS: Fall and Other: LYMPHEDEMA precautions  (DM 2 and asthma)  WEIGHT BEARING RESTRICTIONS: No  FALLS:  Has patient fallen in last 6 months? yes Fell asleep at the computer and fell onto the floor [place] when storm door hit me  LIVING ENVIRONMENT: Lives with: alone Lives in: House/apartment Stairs: No;  Has following equipment at home: Environmental consultant - 4 wheeled  OCCUPATION: retired Scientist, research (life sciences)  LEISURE: reading and taking notes on current events and politics  HAND DOMINANCE: right   PRIOR LEVEL OF FUNCTION: Independent with household mobility with device, Requires assistive device for independence, Needs assistance with ADLs, Needs assistance with homemaking, and Needs assistance with gait  PATIENT GOALS: reduce swelling and keep it from getting worse  OBJECTIVE: Note: Objective measures were completed at Evaluation unless otherwise noted.  COGNITION:  Overall cognitive status: impaired short term  memory   OBSERVATIONS / OTHER ASSESSMENTS: Mild, Stage II, BLE Lymphedema 2/2 suspected venous  insufficiency and hx of obesity   SENSATION: Reports uncomfortable scratching, or sticker-like  sensation on anterior thighs   POSTURE: Raised walker handles 2 notches to increase upright spinal alignment when walking to limit falls risk. Handles need to be raised at least 1 hole more for safe upright posture when walking.  Upright sitting posture: head forward , shoulders forward and rounded- appears to be flexible kyphosis  LE ROM: WFL for ankles and knees. Pt reports limited hip abduction  LE MMT: [facility] FOR LYMPHEDEMA CARE. Presenting with generalized weakness and debility  LYMPHEDEMA ASSESSMENTS:   SURGERY TYPE/DATE: Non-cancer related limb swelling  HX INFECTIONS: positive for 1 episode cellulitis treated w antibiotics  Hx WOUNDS: denies   BLE COMPARATIVE LIMB VOLUMETRICS: INITIAL 07/19/23  LANDMARK RIGHT (dominant)  R LEG (A-D) 3525.1 ml  R THIGH (E-G) ml  R FULL LIMB (A-G) ml  Limb Volume differential (LVD)  %  Volume change since initial %  Volume change overall V  (Blank rows = not tested)  LANDMARK LEFT    L LEG (A-D) 3769.6 ml  L THIGH (E-G) ml  L FULL LIMB (A-G) ml  Limb Volume differential (LVD)  Limb Volume Differential (LVD) measures 6.5%, L>R.  Volume change since initial %  Volume change overall %  RLE COMPARATIVE LIMB VOLUMETRICS: VISIT 9 08/15/23  LANDMARK RIGHT (dominant)  R LEG (A-D) 3549.4 ml  R THIGH (E-G) ml  R FULL LIMB (A-G) ml  Limb Volume differential (LVD)  %  Volume change since initial R LEG volume reduced by 5.84% since initially measured on 07/18/21  Volume change overall V  (Blank rows = not tested)  RLE COMPARATIVE LIMB VOLUMETRICS: VISIT 19 10/03/23  LANDMARK RIGHT (dominant)  R LEG (A-D) 3549.4 ml  R THIGH (E-G) ml  R FULL LIMB (A-G) ml  Limb Volume differential (LVD)  %  Volume change since initial R LEG volume reduced by 14.7% since initially measured on 07/18/21  Volume change overall V    BLE COMPARATIVE LIMB VOLUMETRICS:  29th Visit  LANDMARK RIGHT (dominant)  R LEG (A-D) 3438.1 ml  R THIGH (E-G) ml  R FULL LIMB (A-G) ml  Limb Volume differential (LVD)  %  Volume change since last measured on 10/03/23 (19th visit) DECREASED by 0.67%. R Leg limb volume stable.  Volume change overall Overall R Leg volume reduction = 2.5%. Goal not me to date  (Blank rows = not tested)  LANDMARK LEFT    L LEG (A-D) 4174.4  ml  L THIGH (E-G) ml  L FULL LIMB (A-G) ml  Limb Volume differential (LVD)  .  Volume change since initial on 07/19/23 L LEG volume INCREASED by 10.7% since initially measured on 07/19/23  Volume change overall L LEG volume INCREASED by 10.7& since initially measured on 07/19/23  (Blank rows = not tested)  Mild, Stage  II, Bilateral Lower Extremity Lymphedema 2/2 CVI and Obesity  Skin  Description Hyper-Keratosis Peau' de Orange Shiny Tight Fibrotic/ Indurated Fatty Doughy Spongy/ boggy       R>L x  x   Skin dry Flaky WNL Macerated   mildly      Color Redness Varicosities Blanching Hemosiderin Stain Mottled   x     x   Odor Malodorous Yeast Fungal infection  WNL      x   Temperature Warm Cool wnl    x     Pitting Edema   1+ 2+ 3+ 4+ Non-pitting         x   Girth Symmetrical Asymmetrical                   Distribution    R>L toes to groin    Stemmer Sign Positive Negative   +    Lymphorrhea History Of:  Present Absent     x    Wounds History Of Present Absent Venous Arterial Pressure Sheer     x        Signs of Infection Redness Warmth Erythema Acute Swelling Drainage Borders                    Sensation Light Touch Deep pressure Hypersensitivity   Present Impaired Present Impaired Absent Impaired   x Tactile  x  x     Nails WNL   Fungus nail dystrophy   x     Hair Growth Symmetrical Asymmetrical   x    Skin Creases Base of toes  Ankles   Base of Fingers knees       Abdominal pannus Thigh Lobules  Face/neck   x x  x      (Blank rows = not tested)     LYMPHEDEMA LIFE IMPACT SCALE (LLIS): TBA   TREATMENT DATE:  Pt edu for lymphedema  progress towards goals and self-care  LLE/LLQ MLD LLE multilayer gradient compression wraps; RLE Circaid with mod A  due to time comstraints  PATIENT EDUCATION:  Continued Pt/ CG edu for lymphedema self care home program throughout session. Topics include outcome of comparative limb volumetrics- starting limb volume differentials (LVDs), technology and gradient techniques used for short stretch, multilayer compression wrapping, simple self-MLD, therapeutic lymphatic pumping exercises, skin/nail care, LE precautions, compression garment recommendations and specifications, wear and care schedule and compression garment donning / doffing w assistive devices. Discussed progress towards all OT goals since commencing CDT. Discussed detrimental impact of obesity on lower and upper extremity lymphedema over time. Reviewed OT goals for lymphedema care with Pt and discussed progress to date.  All questions answered to the Pt's satisfaction. Good return. Person educated: Patient  Education method: Explanation, Demonstration, and Handouts Education comprehension: verbalized understanding, returned demonstration, verbal cues required, and needs further education   HOME EXERCISE PROGRAM: BLE lymphatic pumping there ex using- 1 sets of 10 reps, each exercise in order-  1-2 x daily, bilaterally Simple self MLD 1 x daily Daily skin care to increase hydration, skin mobility and decrease infection risk- can be done during MLD During Intensive Phase CDT: Compression wraps 23/7 until limb volume reduction complete During Self-management Phase CDT: Fit with appropriate compression garments or alternatives. Consider BLE, knee length, Mediven, custom, CircAid , Velcro style leggings over soft cotton liners.  ASSESSMENT: CLINICAL IMPRESSION: Emphasis of session on manual therapy to LLE today. Provided MLD to LLE utilizing functional  inguinal LN, and typical non-cancer related lymphedema sequences. Pt tolerated fibrosis techniques to dense fibrosis at medial distal thigh without increased pain. We applied compression wraps to the LLE today below the knee and CircAid to R leg. Cont as per POC. Order CircAid once LLE swelling achieves plateau in reduction.   (INITIAL EVAL 07/12/23: Graysen Graylin Pizza is a 73 yo female presenting with chronic, progressive, BLE swelling 2/2 unknown etiology, which at first glance appears to be circulatory in nature due to skin coloring at distal legs where swelling is most invested. Pt is presently unable to wear traditional elastic compression garments due to difficulty donning and doffing them.  Chronic, progressive lymphedema with associated skin changes, including fibrosis, limits this patent's functional performance in all occupational domains, including functional ambulation and mobility, basic and instrumental ADLs (lower body dressing, LB bathing, fitting street shoes and LB clothing, driving, shopping, and home management. It also limits perform productive activities, leisure pursuits, and participation in social and community activities. BLE lymphedema contributes to elevated infection risk. Pt will benefit from skilled OT for Complete Decongestive Therapy (CDT), which  typically includes manual lymphatic drainage (MLD), skin care to limit' infection risk and increase skin excursion, lymphatic pumping exercise, and during the Intensive Phase multilayer, gradient compression bandaging to reduce limb volume. Once limb volume is reduced as much as possible, Pt is fitted with appropriate compression garments and/ or devices and transitions into the self management phase of care consisting of follow long support PRN.    Pt understands that her fair prognosis will become poor without daily assistance with compression wrapping since she is unable to reach feet and legs to apply them herself. Pt assures  OT that she has a friend she believes is willing to assist her between OT sessions. )   OBJECTIVE IMPAIRMENTS: decreased standing and walking tolerance, activity tolerance, decreased balance, decreased knowledge of condition, decreased knowledge of use of DME, decreased mobility, decreased ROM, decreased  strength, increased edema, impaired flexibility, impaired sensation, impaired UE functional use, impaired vision/reception, pain, and chronic, progressive, BLE swelling and associated skin changes, at increased infection risk   ACTIVITY LIMITATIONS: Functional ambulation and mobility (lifting, carrying, steps/stairs, transfers, squatting) , Basic and instrumental ADLs, leisure pursuits, productive activities, social participation  PARTICIPATION LIMITATIONS: meal prep, cleaning, laundry, driving, shopping, yard work, and    PERSONAL FACTORS: Age, Fitness, Past/current experiences, Time since onset of injury/illness/exacerbation, 2+ co morbidities: OSA, HTN,  are also affecting patient's functional outcome.   REHAB POTENTIAL: Good  EVALUATION COMPLEXITY: Moderate   GOALS: Goals reviewed with patient? Yes  SHORT TERM GOALS: Target date: 4th OT Rx visit   Pt will demonstrate understanding of lymphedema precautions and prevention strategies with modified independence using a printed reference to identify at least 5 precautions and discussing how s/he may implement them into daily life to reduce risk of progression with extra time. Baseline:Max A Goal status: GOAL MET  2.  Pt will be able to apply multilayer, knee length, gradient, compression wraps to one leg at a time from toes to below knee with max caregiver assist to decrease limb volume, to limit infection risk, and to limit lymphedema progression.  Baseline: Dependent Goal status: GOAL MET  LONG TERM GOALS: Target date: 09/19/23  1.Given this patient's Intake score of TBA % on the Lymphedema Life Impact Scale (LLIS), patient will  experience a reduction of at least 5 points in her perceived level of functional impairment resulting from lymphedema to improve functional performance and quality of life (QOL). Baseline: TBA % Goal status:  GOAL DEFERRED  2.  Pt will achieve at least a 10% volume reduction in B legs to return limb to typical size and shape, to limit infection risk and LE progression, to decrease pain, to improve function. Baseline: Dependent Goal status: PROGRESSING. R LEG volume reduced by 14.7% since initially measured on 07/18/21  3.  Pt will obtain appropriate compression garments/devices and achieve modified independence (extra time + assistive devices) with donning/doffing to optimize limb volume reductions and limit LE progression over time. Baseline: Dependent Goal status: PROGRESSING. Pt obtained R ession garment alternative and independence with donning and doffing at home is increasing with practice.  During Intensive phase CDT, with max CG assistance, Pt will achieve at least 85% compliance with all adapted lymphedema self-care home program components, including daily skin care, compression wraps and /or garments, simple self MLD and lymphatic pumping therex to habituate LE self care protocol  into ADLs for optimal LE self-management over time. Baseline: Dependent Goal status: GOAL MET  PLAN:  OT FREQUENCY: 2 x/week  OT DURATION: 12 weeks  PLANNED INTERVENTIONS:compression bandaging, skin care,  97110-Therapeutic exercises, 97530- Therapeutic activity, 97535- Self Care, Manual lymph drainage, DME instructions, and fit with appropriate compression  PLAN FOR NEXT SESSION:  Measure R leg to assess progress towards a stocking, or some alternative Cont Pt edu for LE self-care   Zebedee Dec, MS, OTR/L, CLT-LANA 11/29/23 2:11 PM

## 2023-11-29 NOTE — Therapy (Signed)
 OUTPATIENT PHYSICAL THERAPY THORACOLUMBAR TREATMENT   Patient Name: Judith Arnold MRN: [id number] DOB:1950-11-04, 73 y.o., female Today's Date: 11/29/2023  END OF SESSION:  PT End of Session - 11/29/23 1405     Visit Number 17    Number of Visits 25    Date for PT Re-Evaluation 12/12/23    Progress Note Due on Visit 20    PT Start Time 1405    PT Stop Time 1445    PT Time Calculation (min) 40 min    Equipment Utilized During Treatment Gait belt    Activity Tolerance Patient tolerated treatment well;Patient limited by pain    Behavior During Therapy WFL for tasks assessed/performed         Past Medical History:  Diagnosis Date   Allergy    Arthritis    lower back, left shoulder   Asthma    Carpal tunnel syndrome on both sides    Dental bridge present    top   Dental crown present    multiple   Family history of adverse reaction to anesthesia    Father and sister - PONV   GERD (gastroesophageal reflux disease)    Glaucoma    Hypertension    Leaky heart valve    Pre-diabetes    Sleep apnea    Past Surgical History:  Procedure Laterality Date   CATARACT EXTRACTION W/PHACO Right 11/16/2016   Procedure: CATARACT EXTRACTION PHACO AND INTRAOCULAR LENS PLACEMENT (IOC)  right;  Surgeon: Mittie Gaskin, MD;  Location: [facility];  Service: Ophthalmology;  Laterality: Right;  pre diabetic latex sensitivity sleep apnea   CATARACT EXTRACTION W/PHACO Left 01/18/2017   Procedure: CATARACT EXTRACTION PHACO AND INTRAOCULAR LENS PLACEMENT (IOC) LEFT DIABETIC;  Surgeon: Mittie Gaskin, MD;  Location: [facility];  Service: Ophthalmology;  Laterality: Left;   COLONOSCOPY     COLONOSCOPY WITH PROPOFOL  N/A 04/09/2018   Procedure: COLONOSCOPY WITH PROPOFOL ;  Surgeon: Viktoria Lamar DASEN, MD;  Location: [facility] END[facility]COPY;  Service: Endoscopy;  Laterality: N/A;   IRIDOTOMY / IRIDECTOMY Bilateral 2005   TOOTH EXTRACTION     Patient Active Problem List    Diagnosis Date Noted   Chronic bilateral low back pain with right-sided sciatica 09/29/2023   Hypertension associated with diabetes (HCC) 01/20/2023   Combined hyperlipidemia associated with type 2 diabetes mellitus (HCC) 01/20/2023   Intertrigo 11/01/2022   Stasis dermatitis of both legs 10/21/2022   Leg swelling 10/21/2022   Lymphedema of lower extremity 10/21/2022   Type 2 diabetes mellitus without complication, without long-term current use of insulin (HCC) 05/24/2022   Mixed hyperlipidemia 05/24/2022   Essential hypertension, benign 05/24/2022   Absolute anemia 05/24/2022   Gastroesophageal reflux disease without esophagitis 05/24/2022   Allergic rhinitis 05/24/2022   Asthma, allergic, moderate persistent, uncomplicated 05/24/2022   Bilateral chronic angle-closure glaucoma, indeterminate stage 05/24/2022   Airway hyperreactivity 08/29/2013   OSA on CPAP 08/29/2013    PCP: Fernand Fredy RAMAN, MD  REFERRING PROVIDER: Fernand Fredy, RAMAN, MD  REFERRING DIAG:  Diagnosis  G89.29,M54.41 (ICD-10-CM) - Chronic bilateral low back pain with right-sided sciatica    Rationale for Evaluation and Treatment: Rehabilitation  THERAPY DIAG:  Other low back pain  Difficulty in walking, not elsewhere classified  Pain in right hip  Muscle weakness (generalized)  Other lack of coordination  ONSET DATE: >3 years   SUBJECTIVE:  SUBJECTIVE STATEMENT:  Pt reports she feels pretty good today.  Pt notes a 3-4/10 in the back and hip.  Pt otherwise is doing well.      From EVAL: Pt is a pleasant 73 y/o female presenting to PT eval for chronic LBP with R side hip pain. She reports onset of LBP over 3 years ago. Primary site of pain is mostly felt in R hip and lower spine. Pt reports hx of arthritis in lower back and R hip. She is now  starting to feel arthritis pain in her knees as well. Reports RLE is actually her good leg, has some issues with pain into groin region of LLE. Pt states strength in legs has been impacted and that her general mobility is worse. Pt can only ambulate comfortably by taking weight off of lower back by using her 4WW. She states not using 4WW so much for balance, but for reducing pain with gait. She is unable to stand up fully due to pain. Pt says she used to be very fit (took take karate, gymnastics years ago). She is also seeing OT for BLE lymphedema management. She endorses difficulty getting out of chairs, difficulty with steps, she must use a ramp to get in and out of her home. Pt reports my brain does not retain what I'm talking about, can lose train of thought.  PERTINENT HISTORY:  PMH per chart includes arthritis low back and left shoulder, bilateral carpal tunnel, asthma, glaucoma, HTN, leaky heart valve, sleep apnea, pre-diabetes, being seen by lymphedema specialist for BLE lymphedema   PAIN:  Are you having pain? LBP and R hip pain  Lowest pain level: 0/10  With standing: 2/10  Worst: 5-6/10   PRECAUTIONS: LATEX ALLERGY per chart, fall  RED FLAGS: None  WEIGHT BEARING RESTRICTIONS: No  FALLS:  Has patient fallen in last 6 months? No  LIVING ENVIRONMENT: Uses ramp to get into home due to difficulty with steps, has 4WW  PLOF: Independent  PATIENT GOALS:  Says due to back pain she is unable to reach my feet, has difficulty getting her socks on without a grabber, wants to be more flexible and stronger to increase ease with these activities and mobility    OBJECTIVE:  Note: Objective measures were completed at Evaluation unless otherwise noted.  DIAGNOSTIC FINDINGS:  No recent pertinent imaging available in chart  PATIENT SURVEYS:  Modified Oswestry score: 34%  Interpretation of scores: Score Category Description  0-20% Minimal Disability The patient can cope with most  living activities. Usually no treatment is indicated apart from advice on lifting, sitting and exercise  21-40% Moderate Disability The patient experiences more pain and difficulty with sitting, lifting and standing. Travel and social life are more difficult and they may be disabled from work. Personal care, sexual activity and sleeping are not grossly affected, and the patient can usually be managed by conservative means  41-60% Severe Disability Pain remains the main problem in this group, but activities of daily living are affected. These patients require a detailed investigation  61-80% Crippled Back pain impinges on all aspects of the patient's life. Positive intervention is required  81-100% Bed-bound  These patients are either bed-bound or exaggerating their symptoms  Bluford FORBES Zoe DELENA Karon DELENA, et al. Surgery versus conservative management of stable thoracolumbar fracture: the PRESTO feasibility RCT. [place] ([place]): [employer]; 2021 Nov. [facility] Technology Assessment, No. 25.62.) Appendix 3, Oswestry Disability Index category descriptors. Available from: FindJewel[employer].cz  Minimally Clinically Important Difference (  MCID) = 12.8%  COGNITION: Overall cognitive status: Within functional limits for tasks assessed, very pleasant pt can become tangential and requires redirection to activity    SENSATION: Intact to light touch BLE   MUSCLE LENGTH: deferred  POSTURE: increased thoracic kyphosis, rounded shoulders, unable to achieve full upright position due to pain, maintains increased hip and knee flexion when attempting to stand fully upright   PALPATION: deferred  Thoracolumbar AROM: Extension - unable to achieve neutral position, remains in significant flexion  Flexion - lacking at least 25%  Rotation - lacking at least 40% bilat  and pain-limited    LOWER EXTREMITY MMT:    MMT Right eval Left eval  Hip flexion 4- 3*  Hip extension     Hip abduction 4+ 4+  Hip adduction 4+ 4+  Hip internal rotation    Hip external rotation    Knee flexion 4 3*  Knee extension 4+ 4+  Ankle dorsiflexion 4 4+  Ankle plantarflexion    Ankle inversion    Ankle eversion     (Blank rows = not tested) *=pain limited   LUMBAR SPECIAL TESTS:  deferred  FUNCTIONAL TESTS:  10 meter walk test: 0.63 m/s crouched posture, decreased hip extension, decreased step-length and heel strike, elevated R>L shoulder, heavy BUE weightbearing on 4WW  GAIT: Distance walked: 10MWT/clinic distances (see above) Assistive device utilized: Environmental consultant - 4 wheeled Level of assistance: Modified independence Comments: gait mechanics impaired: decreased gait speed, crouched posture, decreased hip extension, decreased step-length and heel strike, elevated R>L shoulder, heavy BUE weightbearing on 4WW  TREATMENT DATE: 11/27/23   TherAct: To improve functional movements patterns for everyday tasks  Forward/Retro ambulation within the // bars, length of the hallway x8 each direction  Step taps onto 4 step, 2x15 each LE with seated therapeutic rest break between bouts   TherEx: To improve strength, endurance, mobility, and function of specific targeted muscle groups or improve joint range of motion or improve muscle flexibility  Seated hamstring curls with non-latex RTB, 2x15 Seated LAQ 4# LLE and no weight on RLE- 2x 10 reps  Standing calf raises x 12 reps with BUE on railings 2 x 10   Seated scapular rows with latex-free RTB, 2x15 Seated horizontal shoulder abduction, latex-free RTB, 2x15      PATIENT EDUCATION:  Education details: Provided education on findings of exam, indications for plan, prognoses, goals, how PT can help                 Person educated: Patient Education method: Explanation Education comprehension: verbalized understanding  HOME EXERCISE PROGRAM: Access Code: [email or web address] URL: [email or web address] Date:  10/05/2023 Prepared by: Lonni Gainer  Exercises - Supine Bridge  - 3 x weekly - 3 sets - 10 reps - Clamshell  - 3 x weekly - 3 sets - 10 reps - Supine Lower Trunk Rotation  - 1 x daily - 3 sets - 30 sec hold - Seated Hamstring Stretch  - 1 x daily - 3 sets - 30 hold - Seated Clamshell  - 1 x daily - 7 x weekly - 2 sets - 20 reps - 3 sec hold - Supine Transversus Abdominis Bracing - Hands on Stomach  - 3 x weekly - 3 sets - 10 reps - Supine Gluteal Sets  - 3 x weekly - 3 sets - 10 reps  ASSESSMENT:  CLINICAL IMPRESSION:  Pt responded well and put forth great effort throughout the session.  Pt noted to have some increased pain in  the L groin when performing the step taps in the // bars, however was able to continue to perform.  Pt still lacking hip flexor strength and will continue to benefit from exercises that challenge it.  Pt introduced to a few new seated exercises and was able to perform well.   Pt will continue to benefit from skilled therapy to address remaining deficits in order to improve overall QoL and return to PLOF.      OBJECTIVE IMPAIRMENTS: Abnormal gait, decreased activity tolerance, decreased balance, decreased mobility, difficulty walking, decreased strength, hypomobility, increased edema, improper body mechanics, postural dysfunction, and pain.   ACTIVITY LIMITATIONS: carrying, lifting, bending, squatting, stairs, transfers, bed mobility, and locomotion level  PARTICIPATION LIMITATIONS: meal prep, cleaning, shopping, community activity, and yard work  PERSONAL FACTORS: Age, Fitness, Sex, Time since onset of injury/illness/exacerbation, and 3+ comorbidities: PMH per chart includes arthritis low back and left shoulder, bilateral carpal tunnel, asthma, glaucoma, HTN, leaky heart valve, sleep apnea, pre-diabetes, being seen by lymphedema specialist for BLE lymphedema  are also affecting patient's functional outcome.   REHAB POTENTIAL: Good  CLINICAL DECISION MAKING:  Evolving/moderate complexity  EVALUATION COMPLEXITY: Moderate   GOALS: Goals reviewed with patient? Yes   SHORT TERM GOALS: Target date: 10/31/2023    Patient will be independent in home exercise program to improve strength/mobility for better functional independence with ADLs. Baseline:to be initiated; 11/06/2023=Patient reports knowledgeable of HEP but not always able to perform and sometimes painful so has to modify.  Goal status: MET   LONG TERM GOALS: Target date: 12/12/2023    Patient will reduce modified Oswestry score to <20 as to demonstrate minimal disability with ADLs including improved sleeping tolerance, walking/sitting tolerance etc for better mobility with ADLs.  Baseline: 34; 11/06/2023= 32 Goal status: INITIAL  2.  Patient (> 71 years old) will complete five times sit to stand test in < 15 seconds indicating an increased LE strength and improved balance. Baseline: 09/26/2023= 26.46 sec with heavy UE Support; 11/06/2023= 11/06/2023= 22.31 sec without UE Support Goal status: PROGRESSING  3.  Patient will increase Berg Balance score by > 6 points to demonstrate decreased fall risk during functional activities Baseline: 09/26/2023= 31/56; 11/06/2023= 40/56 Goal status: MET  4.  Patient will increase 10 meter walk test to >1.43m/s as to improve gait speed for better community ambulation and to reduce fall risk. Baseline:  0.63 m/s with 4WW; 0.74 m/s Goal status: PROGRESSING      PLAN:  PT FREQUENCY: 1-2x/week  PT DURATION: 12 weeks  PLANNED INTERVENTIONS: 97164- PT Re-evaluation, 97750- Physical Performance Testing, 97110-Therapeutic exercises, 97530- Therapeutic activity, [id number]- Neuromuscular re-education, 97535- Self Care, [id number]- Manual therapy, [id number]- Gait training, [phone]- Orthotic Initial, [phone]- Orthotic/Prosthetic subsequent, [phone]- Canalith repositioning, Patient/Family education, Balance training, Stair training, Taping, Joint mobilization, Spinal  mobilization, Vestibular training, DME instructions, Cryotherapy, and Moist heat.  PLAN FOR NEXT SESSION:   Continue to progress functional mobility in standing and seated if needed  Continue with some static and dynamic balance activities as appropriate. Manual therapy for lumbar ROM/pain  Continue to progress HEP to activities that promote mobility with the least amount of pain as possible.    Fonda Simpers, PT, DPT Physical Therapist - [facility]  11/29/23, 2:06 PM

## 2023-12-05 ENCOUNTER — Ambulatory Visit: Admitting: Occupational Therapy

## 2023-12-05 ENCOUNTER — Encounter: Admitting: Occupational Therapy

## 2023-12-05 ENCOUNTER — Ambulatory Visit

## 2023-12-05 DIAGNOSIS — I890 Lymphedema, not elsewhere classified: Secondary | ICD-10-CM | POA: Diagnosis not present

## 2023-12-05 DIAGNOSIS — M5459 Other low back pain: Secondary | ICD-10-CM | POA: Diagnosis not present

## 2023-12-05 DIAGNOSIS — M6281 Muscle weakness (generalized): Secondary | ICD-10-CM | POA: Diagnosis not present

## 2023-12-05 DIAGNOSIS — R278 Other lack of coordination: Secondary | ICD-10-CM | POA: Diagnosis not present

## 2023-12-05 DIAGNOSIS — M25551 Pain in right hip: Secondary | ICD-10-CM

## 2023-12-05 DIAGNOSIS — K5730 Diverticulosis of large intestine without perforation or abscess without bleeding: Secondary | ICD-10-CM | POA: Diagnosis not present

## 2023-12-05 DIAGNOSIS — R262 Difficulty in walking, not elsewhere classified: Secondary | ICD-10-CM | POA: Diagnosis not present

## 2023-12-05 DIAGNOSIS — Z83719 Family history of colon polyps, unspecified: Secondary | ICD-10-CM | POA: Diagnosis not present

## 2023-12-05 DIAGNOSIS — K5909 Other constipation: Secondary | ICD-10-CM | POA: Diagnosis not present

## 2023-12-05 NOTE — Therapy (Unsigned)
 OUTPATIENT PHYSICAL THERAPY THORACOLUMBAR TREATMENT   Patient Name: Judith Arnold MRN: [id number] DOB:[birth year], 73 y.o., female Today's Date: 12/05/2023  END OF SESSION:   Past Medical History:  Diagnosis Date   Allergy    Arthritis    lower back, left shoulder   Asthma    Carpal tunnel syndrome on both sides    Dental bridge present    top   Dental crown present    multiple   Family history of adverse reaction to anesthesia    Father and sister - PONV   GERD (gastroesophageal reflux disease)    Glaucoma    Hypertension    Leaky heart valve    Pre-diabetes    Sleep apnea    Past Surgical History:  Procedure Laterality Date   CATARACT EXTRACTION W/PHACO Right 11/16/2016   Procedure: CATARACT EXTRACTION PHACO AND INTRAOCULAR LENS PLACEMENT (IOC)  right;  Surgeon: Mittie Gaskin, MD;  Location: [facility];  Service: Ophthalmology;  Laterality: Right;  pre diabetic latex sensitivity sleep apnea   CATARACT EXTRACTION W/PHACO Left 01/18/2017   Procedure: CATARACT EXTRACTION PHACO AND INTRAOCULAR LENS PLACEMENT (IOC) LEFT DIABETIC;  Surgeon: Mittie Gaskin, MD;  Location: [facility];  Service: Ophthalmology;  Laterality: Left;   COLONOSCOPY     COLONOSCOPY WITH PROPOFOL  N/A 04/09/2018   Procedure: COLONOSCOPY WITH PROPOFOL ;  Surgeon: Viktoria Lamar DASEN, MD;  Location: [facility];  Service: Endoscopy;  Laterality: N/A;   IRIDOTOMY / IRIDECTOMY Bilateral 2005   TOOTH EXTRACTION     Patient Active Problem List   Diagnosis Date Noted   Chronic bilateral low back pain with right-sided sciatica 09/29/2023   Hypertension associated with diabetes (HCC) 01/20/2023   Combined hyperlipidemia associated with type 2 diabetes mellitus (HCC) 01/20/2023   Intertrigo 11/01/2022   Stasis dermatitis of both legs 10/21/2022   Leg swelling 10/21/2022   Lymphedema of lower extremity 10/21/2022   Type 2 diabetes mellitus without complication, without long-term  current use of insulin (HCC) 05/24/2022   Mixed hyperlipidemia 05/24/2022   Essential hypertension, benign 05/24/2022   Absolute anemia 05/24/2022   Gastroesophageal reflux disease without esophagitis 05/24/2022   Allergic rhinitis 05/24/2022   Asthma, allergic, moderate persistent, uncomplicated 05/24/2022   Bilateral chronic angle-closure glaucoma, indeterminate stage 05/24/2022   Airway hyperreactivity 08/29/2013   OSA on CPAP 08/29/2013    PCP: Fernand Fredy RAMAN, MD  REFERRING PROVIDER: Fernand Fredy, RAMAN, MD  REFERRING DIAG:  Diagnosis  G89.29,M54.41 (ICD-10-CM) - Chronic bilateral low back pain with right-sided sciatica    Rationale for Evaluation and Treatment: Rehabilitation  THERAPY DIAG:  Lymphedema, not elsewhere classified  ONSET DATE: >3 years   SUBJECTIVE:  SUBJECTIVE STATEMENT:  Pt reports she feels pretty good today.  Pt notes a 3-4/10 in the back and hip.  Pt otherwise is doing well.      From EVAL: Pt is a pleasant 73 y/o female presenting to PT eval for chronic LBP with R side hip pain. She reports onset of LBP over 3 years ago. Primary site of pain is mostly felt in R hip and lower spine. Pt reports hx of arthritis in lower back and R hip. She is now starting to feel arthritis pain in her knees as well. Reports RLE is actually her good leg, has some issues with pain into groin region of LLE. Pt states strength in legs has been impacted and that her general mobility is worse. Pt can only ambulate comfortably by taking weight off of lower back by using her 4WW. She states not using 4WW so much for balance, but for reducing pain with gait. She is unable to stand up fully due to pain. Pt says she used to be very fit (took take karate, gymnastics years ago). She is also seeing OT for BLE lymphedema  management. She endorses difficulty getting out of chairs, difficulty with steps, she must use a ramp to get in and out of her home. Pt reports my brain does not retain what I'm talking about, can lose train of thought.  PERTINENT HISTORY:  PMH per chart includes arthritis low back and left shoulder, bilateral carpal tunnel, asthma, glaucoma, HTN, leaky heart valve, sleep apnea, pre-diabetes, being seen by lymphedema specialist for BLE lymphedema   PAIN:  Are you having pain? LBP and R hip pain  Lowest pain level: 0/10  With standing: 2/10  Worst: 5-6/10   PRECAUTIONS: LATEX ALLERGY per chart, fall  RED FLAGS: None  WEIGHT BEARING RESTRICTIONS: No  FALLS:  Has patient fallen in last 6 months? No  LIVING ENVIRONMENT: Uses ramp to get into home due to difficulty with steps, has 4WW  PLOF: Independent  PATIENT GOALS:  Says due to back pain she is unable to reach my feet, has difficulty getting her socks on without a grabber, wants to be more flexible and stronger to increase ease with these activities and mobility    OBJECTIVE:  Note: Objective measures were completed at Evaluation unless otherwise noted.  DIAGNOSTIC FINDINGS:  No recent pertinent imaging available in chart  PATIENT SURVEYS:  Modified Oswestry score: 34%  Interpretation of scores: Score Category Description  0-20% Minimal Disability The patient can cope with most living activities. Usually no treatment is indicated apart from advice on lifting, sitting and exercise  21-40% Moderate Disability The patient experiences more pain and difficulty with sitting, lifting and standing. Travel and social life are more difficult and they may be disabled from work. Personal care, sexual activity and sleeping are not grossly affected, and the patient can usually be managed by conservative means  41-60% Severe Disability Pain remains the main problem in this group, but activities of daily living are affected. These  patients require a detailed investigation  61-80% Crippled Back pain impinges on all aspects of the patient's life. Positive intervention is required  81-100% Bed-bound  These patients are either bed-bound or exaggerating their symptoms  Bluford FORBES Zoe DELENA Karon DELENA, et al. Surgery versus conservative management of stable thoracolumbar fracture: the PRESTO feasibility RCT. [place]): [employer]; 2021 Nov. [facility] Technology Assessment, No. 25.62.) Appendix 3, Oswestry Disability Index category descriptors. Available from: [email or web address].cz  Minimally Clinically Important Difference (  MCID) = 12.8%  COGNITION: Overall cognitive status: Within functional limits for tasks assessed, very pleasant pt can become tangential and requires redirection to activity    SENSATION: Intact to light touch BLE   MUSCLE LENGTH: deferred  POSTURE: increased thoracic kyphosis, rounded shoulders, unable to achieve full upright position due to pain, maintains increased hip and knee flexion when attempting to stand fully upright   PALPATION: deferred  Thoracolumbar AROM: Extension - unable to achieve neutral position, remains in significant flexion  Flexion - lacking at least 25%  Rotation - lacking at least 40% bilat  and pain-limited    LOWER EXTREMITY MMT:    MMT Right eval Left eval  Hip flexion 4- 3*  Hip extension    Hip abduction 4+ 4+  Hip adduction 4+ 4+  Hip internal rotation    Hip external rotation    Knee flexion 4 3*  Knee extension 4+ 4+  Ankle dorsiflexion 4 4+  Ankle plantarflexion    Ankle inversion    Ankle eversion     (Blank rows = not tested) *=pain limited   LUMBAR SPECIAL TESTS:  deferred  FUNCTIONAL TESTS:  10 meter walk test: 0.63 m/s crouched posture, decreased hip extension, decreased step-length and heel strike, elevated R>L shoulder, heavy BUE weightbearing on 4WW  GAIT: Distance walked: 10MWT/clinic distances  (see above) Assistive device utilized: Environmental consultant - 4 wheeled Level of assistance: Modified independence Comments: gait mechanics impaired: decreased gait speed, crouched posture, decreased hip extension, decreased step-length and heel strike, elevated R>L shoulder, heavy BUE weightbearing on 4WW  TREATMENT DATE: 11/27/23   TherAct: To improve functional movements patterns for everyday tasks  Forward/Retro ambulation within the // bars, length of the hallway x8 each direction  Step taps onto 4 step, 2x15 each LE with seated therapeutic rest break between bouts   TherEx: To improve strength, endurance, mobility, and function of specific targeted muscle groups or improve joint range of motion or improve muscle flexibility  Seated hamstring curls with non-latex RTB, 2x15 Seated LAQ 4# LLE and no weight on RLE- 2x 10 reps  Standing calf raises x 12 reps with BUE on railings 2 x 10   Seated scapular rows with latex-free RTB, 2x15 Seated horizontal shoulder abduction, latex-free RTB, 2x15      PATIENT EDUCATION:  Education details: Provided education on findings of exam, indications for plan, prognoses, goals, how PT can help                 Person educated: Patient Education method: Explanation Education comprehension: verbalized understanding  HOME EXERCISE PROGRAM: Access Code: [email or web address] URL: [email or web address] Date: 10/05/2023 Prepared by: Lonni Gainer  Exercises - Supine Bridge  - 3 x weekly - 3 sets - 10 reps - Clamshell  - 3 x weekly - 3 sets - 10 reps - Supine Lower Trunk Rotation  - 1 x daily - 3 sets - 30 sec hold - Seated Hamstring Stretch  - 1 x daily - 3 sets - 30 hold - Seated Clamshell  - 1 x daily - 7 x weekly - 2 sets - 20 reps - 3 sec hold - Supine Transversus Abdominis Bracing - Hands on Stomach  - 3 x weekly - 3 sets - 10 reps - Supine Gluteal Sets  - 3 x weekly - 3 sets - 10 reps  ASSESSMENT:  CLINICAL IMPRESSION:  Pt responded well  and put forth great effort throughout the session.  Pt noted to have some increased pain in  the L groin when performing the step taps in the // bars, however was able to continue to perform.  Pt still lacking hip flexor strength and will continue to benefit from exercises that challenge it.  Pt introduced to a few new seated exercises and was able to perform well.   Pt will continue to benefit from skilled therapy to address remaining deficits in order to improve overall QoL and return to PLOF.      OBJECTIVE IMPAIRMENTS: Abnormal gait, decreased activity tolerance, decreased balance, decreased mobility, difficulty walking, decreased strength, hypomobility, increased edema, improper body mechanics, postural dysfunction, and pain.   ACTIVITY LIMITATIONS: carrying, lifting, bending, squatting, stairs, transfers, bed mobility, and locomotion level  PARTICIPATION LIMITATIONS: meal prep, cleaning, shopping, community activity, and yard work  PERSONAL FACTORS: Age, Fitness, Sex, Time since onset of injury/illness/exacerbation, and 3+ comorbidities: PMH per chart includes arthritis low back and left shoulder, bilateral carpal tunnel, asthma, glaucoma, HTN, leaky heart valve, sleep apnea, pre-diabetes, being seen by lymphedema specialist for BLE lymphedema  are also affecting patient's functional outcome.   REHAB POTENTIAL: Good  CLINICAL DECISION MAKING: Evolving/moderate complexity  EVALUATION COMPLEXITY: Moderate   GOALS: Goals reviewed with patient? Yes   SHORT TERM GOALS: Target date: 10/31/2023    Patient will be independent in home exercise program to improve strength/mobility for better functional independence with ADLs. Baseline:to be initiated; 11/06/2023=Patient reports knowledgeable of HEP but not always able to perform and sometimes painful so has to modify.  Goal status: MET   LONG TERM GOALS: Target date: 12/12/2023    Patient will reduce modified Oswestry score to <20 as to  demonstrate minimal disability with ADLs including improved sleeping tolerance, walking/sitting tolerance etc for better mobility with ADLs.  Baseline: 34; 11/06/2023= 32 Goal status: INITIAL  2.  Patient (> 38 years old) will complete five times sit to stand test in < 15 seconds indicating an increased LE strength and improved balance. Baseline: 09/26/2023= 26.46 sec with heavy UE Support; 11/06/2023= 11/06/2023= 22.31 sec without UE Support Goal status: PROGRESSING  3.  Patient will increase Berg Balance score by > 6 points to demonstrate decreased fall risk during functional activities Baseline: 09/26/2023= 31/56; 11/06/2023= 40/56 Goal status: MET  4.  Patient will increase 10 meter walk test to >1.34m/s as to improve gait speed for better community ambulation and to reduce fall risk. Baseline:  0.63 m/s with 4WW; 0.74 m/s Goal status: PROGRESSING      PLAN:  PT FREQUENCY: 1-2x/week  PT DURATION: 12 weeks  PLANNED INTERVENTIONS: [id number]- PT Re-evaluation, 97750- Physical Performance Testing, 97110-Therapeutic exercises, 97530- Therapeutic activity, [id number]- Neuromuscular re-education, 97535- Self Care, [id number]- Manual therapy, [id number]- Gait training, [phone]- Orthotic Initial, [phone]- Orthotic/Prosthetic subsequent, [phone]- Canalith repositioning, Patient/Family education, Balance training, Stair training, Taping, Joint mobilization, Spinal mobilization, Vestibular training, DME instructions, Cryotherapy, and Moist heat.  PLAN FOR NEXT SESSION:   Continue to progress functional mobility in standing and seated if needed  Continue with some static and dynamic balance activities as appropriate. Manual therapy for lumbar ROM/pain  Continue to progress HEP to activities that promote mobility with the least amount of pain as possible.    Fonda Simpers, PT, DPT Physical Therapist - [facility]  12/05/23, 4:08 PM

## 2023-12-05 NOTE — Therapy (Signed)
 OUTPATIENT PHYSICAL THERAPY THORACOLUMBAR TREATMENT   Patient Name: Judith Arnold MRN: [id number] DOB:[birth year], 73 y.o., female Today's Date: 12/06/2023  END OF SESSION:  PT End of Session - 12/05/23 1601     Visit Number 18    Number of Visits 25    Date for Recertification  12/12/23    Progress Note Due on Visit 20    PT Start Time 1616    PT Stop Time 1700    PT Time Calculation (min) 44 min    Equipment Utilized During Treatment Gait belt    Activity Tolerance Patient tolerated treatment well;Patient limited by pain    Behavior During Therapy WFL for tasks assessed/performed         Past Medical History:  Diagnosis Date   Allergy    Arthritis    lower back, left shoulder   Asthma    Carpal tunnel syndrome on both sides    Dental bridge present    top   Dental crown present    multiple   Family history of adverse reaction to anesthesia    Father and sister - PONV   GERD (gastroesophageal reflux disease)    Glaucoma    Hypertension    Leaky heart valve    Pre-diabetes    Sleep apnea    Past Surgical History:  Procedure Laterality Date   CATARACT EXTRACTION W/PHACO Right 11/16/2016   Procedure: CATARACT EXTRACTION PHACO AND INTRAOCULAR LENS PLACEMENT (IOC)  right;  Surgeon: Mittie Gaskin, MD;  Location: [facility];  Service: Ophthalmology;  Laterality: Right;  pre diabetic latex sensitivity sleep apnea   CATARACT EXTRACTION W/PHACO Left 01/18/2017   Procedure: CATARACT EXTRACTION PHACO AND INTRAOCULAR LENS PLACEMENT (IOC) LEFT DIABETIC;  Surgeon: Mittie Gaskin, MD;  Location: [facility];  Service: Ophthalmology;  Laterality: Left;   COLONOSCOPY     COLONOSCOPY WITH PROPOFOL  N/A 04/09/2018   Procedure: COLONOSCOPY WITH PROPOFOL ;  Surgeon: Viktoria Lamar DASEN, MD;  Location: [facility];  Service: Endoscopy;  Laterality: N/A;   IRIDOTOMY / IRIDECTOMY Bilateral 2005   TOOTH EXTRACTION     Patient Active Problem List    Diagnosis Date Noted   Chronic bilateral low back pain with right-sided sciatica 09/29/2023   Hypertension associated with diabetes (HCC) 01/20/2023   Combined hyperlipidemia associated with type 2 diabetes mellitus (HCC) 01/20/2023   Intertrigo 11/01/2022   Stasis dermatitis of both legs 10/21/2022   Leg swelling 10/21/2022   Lymphedema of lower extremity 10/21/2022   Type 2 diabetes mellitus without complication, without long-term current use of insulin (HCC) 05/24/2022   Mixed hyperlipidemia 05/24/2022   Essential hypertension, benign 05/24/2022   Absolute anemia 05/24/2022   Gastroesophageal reflux disease without esophagitis 05/24/2022   Allergic rhinitis 05/24/2022   Asthma, allergic, moderate persistent, uncomplicated 05/24/2022   Bilateral chronic angle-closure glaucoma, indeterminate stage 05/24/2022   Airway hyperreactivity 08/29/2013   OSA on CPAP 08/29/2013    PCP: Fernand Fredy RAMAN, MD  REFERRING PROVIDER: Fernand Fredy, RAMAN, MD  REFERRING DIAG:  Diagnosis  G89.29,M54.41 (ICD-10-CM) - Chronic bilateral low back pain with right-sided sciatica    Rationale for Evaluation and Treatment: Rehabilitation  THERAPY DIAG:  Other low back pain  Difficulty in walking, not elsewhere classified  Pain in right hip  Muscle weakness (generalized)  Other lack of coordination  ONSET DATE: >3 years   SUBJECTIVE:  SUBJECTIVE STATEMENT:  Pt reports her back is more sore overall today-states having pain with walking.     From EVAL: Pt is a pleasant 73 y/o female presenting to PT eval for chronic LBP with R side hip pain. She reports onset of LBP over 3 years ago. Primary site of pain is mostly felt in R hip and lower spine. Pt reports hx of arthritis in lower back and R hip. She is now starting to feel arthritis pain  in her knees as well. Reports RLE is actually her good leg, has some issues with pain into groin region of LLE. Pt states strength in legs has been impacted and that her general mobility is worse. Pt can only ambulate comfortably by taking weight off of lower back by using her 4WW. She states not using 4WW so much for balance, but for reducing pain with gait. She is unable to stand up fully due to pain. Pt says she used to be very fit (took take karate, gymnastics years ago). She is also seeing OT for BLE lymphedema management. She endorses difficulty getting out of chairs, difficulty with steps, she must use a ramp to get in and out of her home. Pt reports my brain does not retain what I'm talking about, can lose train of thought.  PERTINENT HISTORY:  PMH per chart includes arthritis low back and left shoulder, bilateral carpal tunnel, asthma, glaucoma, HTN, leaky heart valve, sleep apnea, pre-diabetes, being seen by lymphedema specialist for BLE lymphedema   PAIN:  Are you having pain? LBP and R hip pain  Lowest pain level: 0/10  With standing: 2/10  Worst: 5-6/10   PRECAUTIONS: LATEX ALLERGY per chart, fall  RED FLAGS: None  WEIGHT BEARING RESTRICTIONS: No  FALLS:  Has patient fallen in last 6 months? No  LIVING ENVIRONMENT: Uses ramp to get into home due to difficulty with steps, has 4WW  PLOF: Independent  PATIENT GOALS:  Says due to back pain she is unable to reach my feet, has difficulty getting her socks on without a grabber, wants to be more flexible and stronger to increase ease with these activities and mobility    OBJECTIVE:  Note: Objective measures were completed at Evaluation unless otherwise noted.  DIAGNOSTIC FINDINGS:  No recent pertinent imaging available in chart  PATIENT SURVEYS:  Modified Oswestry score: 34%  Interpretation of scores: Score Category Description  0-20% Minimal Disability The patient can cope with most living activities. Usually no  treatment is indicated apart from advice on lifting, sitting and exercise  21-40% Moderate Disability The patient experiences more pain and difficulty with sitting, lifting and standing. Travel and social life are more difficult and they may be disabled from work. Personal care, sexual activity and sleeping are not grossly affected, and the patient can usually be managed by conservative means  41-60% Severe Disability Pain remains the main problem in this group, but activities of daily living are affected. These patients require a detailed investigation  61-80% Crippled Back pain impinges on all aspects of the patient's life. Positive intervention is required  81-100% Bed-bound  These patients are either bed-bound or exaggerating their symptoms  Bluford FORBES Zoe DELENA Karon DELENA, et al. Surgery versus conservative management of stable thoracolumbar fracture: the PRESTO feasibility RCT. [place]): [employer]; 2021 Nov. [facility] Technology Assessment, No. 25.62.) Appendix 3, Oswestry Disability Index category descriptors. Available from: FindJewel[employer].cz  Minimally Clinically Important Difference (MCID) = 12.8%  COGNITION: Overall cognitive status: Within functional limits  for tasks assessed, very pleasant pt can become tangential and requires redirection to activity    SENSATION: Intact to light touch BLE   MUSCLE LENGTH: deferred  POSTURE: increased thoracic kyphosis, rounded shoulders, unable to achieve full upright position due to pain, maintains increased hip and knee flexion when attempting to stand fully upright   PALPATION: deferred  Thoracolumbar AROM: Extension - unable to achieve neutral position, remains in significant flexion  Flexion - lacking at least 25%  Rotation - lacking at least 40% bilat  and pain-limited    LOWER EXTREMITY MMT:    MMT Right eval Left eval  Hip flexion 4- 3*  Hip extension    Hip abduction 4+ 4+  Hip  adduction 4+ 4+  Hip internal rotation    Hip external rotation    Knee flexion 4 3*  Knee extension 4+ 4+  Ankle dorsiflexion 4 4+  Ankle plantarflexion    Ankle inversion    Ankle eversion     (Blank rows = not tested) *=pain limited   LUMBAR SPECIAL TESTS:  deferred  FUNCTIONAL TESTS:  10 meter walk test: 0.63 m/s crouched posture, decreased hip extension, decreased step-length and heel strike, elevated R>L shoulder, heavy BUE weightbearing on 4WW  GAIT: Distance walked: 10MWT/clinic distances (see above) Assistive device utilized: Environmental consultant - 4 wheeled Level of assistance: Modified independence Comments: gait mechanics impaired: decreased gait speed, crouched posture, decreased hip extension, decreased step-length and heel strike, elevated R>L shoulder, heavy BUE weightbearing on 4WW  TREATMENT DATE: 12/05/23   TherAct: To improve functional movements patterns for everyday tasks  Sit to stand x 12 reps (min UE support)  (VC to lean forward)    TherEx: To improve strength, endurance, mobility, and function of specific targeted muscle groups or improve joint range of motion or improve muscle flexibility  Seated hamstring curls with non-latex GTB, 2x15 Seated LAQ  no weight on either LE- 2x 10 reps  Seated calf raises 2 x 15 reps BLE Seated Lumbar flex into ext with GTB resistance 2 x 10 reps  NMR: Seated scapular rows with latex-free RTB, 2x15 Seated horizontal shoulder abduction, latex-free GTB, 2x15 Seated D2 shoulder PNF (GTB- RUE and no band on LUE) 2 x 10 rep       PATIENT EDUCATION:  Education details: Provided education on findings of exam, indications for plan, prognoses, goals, how PT can help                 Person educated: Patient Education method: Explanation Education comprehension: verbalized understanding  HOME EXERCISE PROGRAM: Access Code: [email or web address] URL: [email or web address] Date: 10/05/2023 Prepared by: Lonni Gainer  Exercises - Supine Bridge  - 3 x weekly - 3 sets - 10 reps - Clamshell  - 3 x weekly - 3 sets - 10 reps - Supine Lower Trunk Rotation  - 1 x daily - 3 sets - 30 sec hold - Seated Hamstring Stretch  - 1 x daily - 3 sets - 30 hold - Seated Clamshell  - 1 x daily - 7 x weekly - 2 sets - 20 reps - 3 sec hold - Supine Transversus Abdominis Bracing - Hands on Stomach  - 3 x weekly - 3 sets - 10 reps - Supine Gluteal Sets  - 3 x weekly - 3 sets - 10 reps  ASSESSMENT:  CLINICAL IMPRESSION:   Treatment largely targeted seated LE strengthening due to initial report of increased low back pain with mobility today. Targeted LE strengthening for  improved muscle endurance with standing/walking activities as well as postural strengthening awareness. Patient performed well- able to increased resistance band strength to green today without complaint. Issued latex-free GTB today. Pt will continue to benefit from skilled therapy to address remaining deficits in order to improve overall QoL and return to PLOF.      OBJECTIVE IMPAIRMENTS: Abnormal gait, decreased activity tolerance, decreased balance, decreased mobility, difficulty walking, decreased strength, hypomobility, increased edema, improper body mechanics, postural dysfunction, and pain.   ACTIVITY LIMITATIONS: carrying, lifting, bending, squatting, stairs, transfers, bed mobility, and locomotion level  PARTICIPATION LIMITATIONS: meal prep, cleaning, shopping, community activity, and yard work  PERSONAL FACTORS: Age, Fitness, Sex, Time since onset of injury/illness/exacerbation, and 3+ comorbidities: PMH per chart includes arthritis low back and left shoulder, bilateral carpal tunnel, asthma, glaucoma, HTN, leaky heart valve, sleep apnea, pre-diabetes, being seen by lymphedema specialist for BLE lymphedema  are also affecting patient's functional outcome.   REHAB POTENTIAL: Good  CLINICAL DECISION MAKING: Evolving/moderate  complexity  EVALUATION COMPLEXITY: Moderate   GOALS: Goals reviewed with patient? Yes   SHORT TERM GOALS: Target date: 10/31/2023    Patient will be independent in home exercise program to improve strength/mobility for better functional independence with ADLs. Baseline:to be initiated; 11/06/2023=Patient reports knowledgeable of HEP but not always able to perform and sometimes painful so has to modify.  Goal status: MET   LONG TERM GOALS: Target date: 12/12/2023    Patient will reduce modified Oswestry score to <20 as to demonstrate minimal disability with ADLs including improved sleeping tolerance, walking/sitting tolerance etc for better mobility with ADLs.  Baseline: 34; 11/06/2023= 32 Goal status: INITIAL  2.  Patient (> 55 years old) will complete five times sit to stand test in < 15 seconds indicating an increased LE strength and improved balance. Baseline: 09/26/2023= 26.46 sec with heavy UE Support; 11/06/2023= 11/06/2023= 22.31 sec without UE Support Goal status: PROGRESSING  3.  Patient will increase Berg Balance score by > 6 points to demonstrate decreased fall risk during functional activities Baseline: 09/26/2023= 31/56; 11/06/2023= 40/56 Goal status: MET  4.  Patient will increase 10 meter walk test to >1.86m/s as to improve gait speed for better community ambulation and to reduce fall risk. Baseline:  0.63 m/s with 4WW; 0.74 m/s Goal status: PROGRESSING      PLAN:  PT FREQUENCY: 1-2x/week  PT DURATION: 12 weeks  PLANNED INTERVENTIONS: [id number]- PT Re-evaluation, 97750- Physical Performance Testing, 97110-Therapeutic exercises, 97530- Therapeutic activity, [id number]- Neuromuscular re-education, 97535- Self Care, [id number]- Manual therapy, [id number]- Gait training, [phone]- Orthotic Initial, [phone]- Orthotic/Prosthetic subsequent, [phone]- Canalith repositioning, Patient/Family education, Balance training, Stair training, Taping, Joint mobilization, Spinal mobilization, Vestibular  training, DME instructions, Cryotherapy, and Moist heat.  PLAN FOR NEXT SESSION:   Continue to progress functional mobility in standing and seated if needed  Continue with some static and dynamic balance activities as appropriate. Manual therapy for lumbar ROM/pain Continue to progress HEP to activities that promote mobility with the least amount of pain as possible.   Chyrl London, PT Physical Therapist - [facility]  12/06/23, 8:35 AM

## 2023-12-06 ENCOUNTER — Ambulatory Visit

## 2023-12-06 NOTE — Therapy (Signed)
 OUTPATIENT OCCUPATIONAL THERAPY TREATMENT NOTE   BILATERAL LOWER EXTREMITY LYMPHEDEMA  Patient Name: CANIYA TAGLE MRN: [id number] DOB:[birth year], 73 y.o., female Today's Date: 12/06/2023  REPORTING PERIOD: END OF SESSION:   OT End of Session - 12/05/23 1513     Visit Number 36    Number of Visits 36    Date for Recertification  01/16/24    OT Start Time 0306    OT Stop Time 0406    OT Time Calculation (min) 60 min    Equipment Utilized During Treatment sock donner; footstool    Activity Tolerance Patient tolerated treatment well;No increased pain    Behavior During Therapy WFL for tasks assessed/performed           Past Medical History:  Diagnosis Date   Allergy    Arthritis    lower back, left shoulder   Asthma    Carpal tunnel syndrome on both sides    Dental bridge present    top   Dental crown present    multiple   Family history of adverse reaction to anesthesia    Father and sister - PONV   GERD (gastroesophageal reflux disease)    Glaucoma    Hypertension    Leaky heart valve    Pre-diabetes    Sleep apnea    Past Surgical History:  Procedure Laterality Date   CATARACT EXTRACTION W/PHACO Right 11/16/2016   Procedure: CATARACT EXTRACTION PHACO AND INTRAOCULAR LENS PLACEMENT (IOC)  right;  Surgeon: Mittie Gaskin, MD;  Location: [facility];  Service: Ophthalmology;  Laterality: Right;  pre diabetic latex sensitivity sleep apnea   CATARACT EXTRACTION W/PHACO Left 01/18/2017   Procedure: CATARACT EXTRACTION PHACO AND INTRAOCULAR LENS PLACEMENT (IOC) LEFT DIABETIC;  Surgeon: Mittie Gaskin, MD;  Location: [facility];  Service: Ophthalmology;  Laterality: Left;   COLONOSCOPY     COLONOSCOPY WITH PROPOFOL  N/A 04/09/2018   Procedure: COLONOSCOPY WITH PROPOFOL ;  Surgeon: Viktoria Lamar DASEN, MD;  Location: [facility];  Service: Endoscopy;  Laterality: N/A;   IRIDOTOMY / IRIDECTOMY Bilateral 2005   TOOTH EXTRACTION     Patient  Active Problem List   Diagnosis Date Noted   Chronic bilateral low back pain with right-sided sciatica 09/29/2023   Hypertension associated with diabetes (HCC) 01/20/2023   Combined hyperlipidemia associated with type 2 diabetes mellitus (HCC) 01/20/2023   Intertrigo 11/01/2022   Stasis dermatitis of both legs 10/21/2022   Leg swelling 10/21/2022   Lymphedema of lower extremity 10/21/2022   Type 2 diabetes mellitus without complication, without long-term current use of insulin (HCC) 05/24/2022   Mixed hyperlipidemia 05/24/2022   Essential hypertension, benign 05/24/2022   Absolute anemia 05/24/2022   Gastroesophageal reflux disease without esophagitis 05/24/2022   Allergic rhinitis 05/24/2022   Asthma, allergic, moderate persistent, uncomplicated 05/24/2022   Bilateral chronic angle-closure glaucoma, indeterminate stage 05/24/2022   Airway hyperreactivity 08/29/2013   OSA on CPAP 08/29/2013    PCP: Fredy GORMAN Bathe, MD  REFERRING PROVIDER: same  REFERRING DIAG: lymphedema I89.0  THERAPY DIAG:  Lymphedema, not elsewhere classified  Rationale for Evaluation and Treatment: Rehabilitation  ONSET DATE: Pt reports onset of L ankle / leg swelling after injury 20-30 yrs ago. Then about 1 year ago legs started swelling and Pt having prickly sensation on shins and thighs bilaterally.   SUBJECTIVE:  SUBJECTIVE STATEMENT: Lalaine Overstreet presents to OT for treatment of BLE lymphedema 2/2 suspected CVI and obesity. Pt is unaccompanied today. Pt reports 5/10 in her lower back. My legs are not hurting. Pt has no new complaints. She has PT immediately after OT today.   (INITIAL EVAL 07/12/23 : Robin Petrakis is referred to Occupational Therapy by Fredy GORMAN Bathe, MD,  for evaluation and treatment of BLE  lymphedema.  Pt reports onset of L ankle and distal leg swelling about 30 years ago, then noticed RLE swelling and worsening LLE swelling about 1 year ago at same time she started having unusual sensation in legs. Pt denies previously having lymphedema treatment. Pt reports paternal aunt had leg swelling. Pt is unable to wear compression stockings because she is unable to reach feet and distal legs to don and doff them due to back and hip pain.  PERTINENT HISTORY: Relevant to lymphedema (OSA (denies using CPAP), BLE stasis dermatitis, HTN, DM 2 (Pt states she is prediabetic only), B glaucoma, Back and R hip OA  PAIN:  Are you having LE-related pain? Yes: NPRS scale: 0/10 Pain location: medial proximal thighs w abduction, bilateral groin, buttocks Pain description: like stickers Aggravating factors: standing, walking, dependent sitting Relieving factors: elevation  PRECAUTIONS: Fall and Other: LYMPHEDEMA precautions  (DM 2 and asthma)  WEIGHT BEARING RESTRICTIONS: No  FALLS:  Has patient fallen in last 6 months? yes Fell asleep at the computer and fell onto the floor [place] when storm door hit me  LIVING ENVIRONMENT: Lives with: alone Lives in: House/apartment Stairs: No;  Has following equipment at home: Environmental consultant - 4 wheeled  OCCUPATION: retired Scientist, research (life sciences)  LEISURE: reading and taking notes on current events and politics  HAND DOMINANCE: right   PRIOR LEVEL OF FUNCTION: Independent with household mobility with device, Requires assistive device for independence, Needs assistance with ADLs, Needs assistance with homemaking, and Needs assistance with gait  PATIENT GOALS: reduce swelling and keep it from getting worse  OBJECTIVE: Note: Objective measures were completed at Evaluation unless otherwise noted.  COGNITION:  Overall cognitive status: impaired short term  memory   OBSERVATIONS / OTHER ASSESSMENTS: Mild, Stage II, BLE Lymphedema 2/2 suspected venous  insufficiency and hx of obesity   SENSATION: Reports uncomfortable scratching, or sticker-like  sensation on anterior thighs   POSTURE: Raised walker handles 2 notches to increase upright spinal alignment when walking to limit falls risk. Handles need to be raised at least 1 hole more for safe upright posture when walking.  Upright sitting posture: head forward , shoulders forward and rounded- appears to be flexible kyphosis  LE ROM: WFL for ankles and knees. Pt reports limited hip abduction  LE MMT: [facility] FOR LYMPHEDEMA CARE. Presenting with generalized weakness and debility  LYMPHEDEMA ASSESSMENTS:   SURGERY TYPE/DATE: Non-cancer related limb swelling  HX INFECTIONS: positive for 1 episode cellulitis treated w antibiotics  Hx WOUNDS: denies   BLE COMPARATIVE LIMB VOLUMETRICS: INITIAL 07/19/23  LANDMARK RIGHT (dominant)  R LEG (A-D) 3525.1 ml  R THIGH (E-G) ml  R FULL LIMB (A-G) ml  Limb Volume differential (LVD)  %  Volume change since initial %  Volume change overall V  (Blank rows = not tested)  LANDMARK LEFT    L LEG (A-D) 3769.6 ml  L THIGH (E-G) ml  L FULL LIMB (A-G) ml  Limb Volume differential (LVD)  Limb Volume Differential (LVD) measures 6.5%, L>R.  Volume change since initial %  Volume change overall %  RLE COMPARATIVE LIMB VOLUMETRICS: VISIT 9 08/15/23  LANDMARK RIGHT (dominant)  R LEG (A-D) 3549.4 ml  R THIGH (E-G) ml  R FULL LIMB (A-G) ml  Limb Volume differential (LVD)  %  Volume change since initial R LEG volume reduced by 5.84% since initially measured on 07/18/21  Volume change overall V  (Blank rows = not tested)  RLE COMPARATIVE LIMB VOLUMETRICS: VISIT 19 10/03/23  LANDMARK RIGHT (dominant)  R LEG (A-D) 3549.4 ml  R THIGH (E-G) ml  R FULL LIMB (A-G) ml  Limb Volume differential (LVD)  %  Volume change since initial R LEG volume reduced by 14.7% since initially measured on 07/18/21  Volume change overall V    BLE COMPARATIVE LIMB VOLUMETRICS:  29th Visit  LANDMARK RIGHT (dominant)  R LEG (A-D) 3438.1 ml  R THIGH (E-G) ml  R FULL LIMB (A-G) ml  Limb Volume differential (LVD)  %  Volume change since last measured on 10/03/23 (19th visit) DECREASED by 0.67%. R Leg limb volume stable.  Volume change overall Overall R Leg volume reduction = 2.5%. Goal not me to date  (Blank rows = not tested)  LANDMARK LEFT    L LEG (A-D) 4174.4  ml  L THIGH (E-G) ml  L FULL LIMB (A-G) ml  Limb Volume differential (LVD)  .  Volume change since initial on 07/19/23 L LEG volume INCREASED by 10.7% since initially measured on 07/19/23  Volume change overall L LEG volume INCREASED by 10.7& since initially measured on 07/19/23  (Blank rows = not tested)  Mild, Stage  II, Bilateral Lower Extremity Lymphedema 2/2 CVI and Obesity  Skin  Description Hyper-Keratosis Peau' de Orange Shiny Tight Fibrotic/ Indurated Fatty Doughy Spongy/ boggy       R>L x  x   Skin dry Flaky WNL Macerated   mildly      Color Redness Varicosities Blanching Hemosiderin Stain Mottled   x     x   Odor Malodorous Yeast Fungal infection  WNL      x   Temperature Warm Cool wnl    x     Pitting Edema   1+ 2+ 3+ 4+ Non-pitting         x   Girth Symmetrical Asymmetrical                   Distribution    R>L toes to groin    Stemmer Sign Positive Negative   +    Lymphorrhea History Of:  Present Absent     x    Wounds History Of Present Absent Venous Arterial Pressure Sheer     x        Signs of Infection Redness Warmth Erythema Acute Swelling Drainage Borders                    Sensation Light Touch Deep pressure Hypersensitivity   Present Impaired Present Impaired Absent Impaired   x Tactile  x  x     Nails WNL   Fungus nail dystrophy   x     Hair Growth Symmetrical Asymmetrical   x    Skin Creases Base of toes  Ankles   Base of Fingers knees       Abdominal pannus Thigh Lobules  Face/neck   x x  x      (Blank rows = not tested)     LYMPHEDEMA LIFE IMPACT SCALE (LLIS): TBA   TREATMENT DATE:  Pt edu for lymphedema  progress towards goals and self-care  LLE/LLQ MLD LLE multilayer gradient compression wraps; RLE Circaid with mod A  due to time comstraints  PATIENT EDUCATION:  Continued Pt/ CG edu for lymphedema self care home program throughout session. Topics include outcome of comparative limb volumetrics- starting limb volume differentials (LVDs), technology and gradient techniques used for short stretch, multilayer compression wrapping, simple self-MLD, therapeutic lymphatic pumping exercises, skin/nail care, LE precautions, compression garment recommendations and specifications, wear and care schedule and compression garment donning / doffing w assistive devices. Discussed progress towards all OT goals since commencing CDT. Discussed detrimental impact of obesity on lower and upper extremity lymphedema over time. Reviewed OT goals for lymphedema care with Pt and discussed progress to date.  All questions answered to the Pt's satisfaction. Good return. Person educated: Patient  Education method: Explanation, Demonstration, and Handouts Education comprehension: verbalized understanding, returned demonstration, verbal cues required, and needs further education   HOME EXERCISE PROGRAM: BLE lymphatic pumping there ex using- 1 sets of 10 reps, each exercise in order-  1-2 x daily, bilaterally Simple self MLD 1 x daily Daily skin care to increase hydration, skin mobility and decrease infection risk- can be done during MLD During Intensive Phase CDT: Compression wraps 23/7 until limb volume reduction complete During Self-management Phase CDT: Fit with appropriate compression garments or alternatives. Consider BLE, knee length, Mediven, custom, CircAid , Velcro style leggings over soft cotton liners.  ASSESSMENT: CLINICAL IMPRESSION: Emphasis of session on manual therapy to LLE today. Provided MLD to LLE utilizing functional  inguinal LN, and typical non-cancer related lymphedema sequences. Pt tolerated fibrosis techniques to dense fibrosis at medial distal thigh without increased pain. We applied compression wraps to the LLE today below the knee and CircAid to R leg. Cont as per POC. Order CircAid once LLE swelling achieves plateau in reduction.   (INITIAL EVAL 07/12/23: Joanmarie Graylin Pizza is a 73 yo female presenting with chronic, progressive, BLE swelling 2/2 unknown etiology, which at first glance appears to be circulatory in nature due to skin coloring at distal legs where swelling is most invested. Pt is presently unable to wear traditional elastic compression garments due to difficulty donning and doffing them.  Chronic, progressive lymphedema with associated skin changes, including fibrosis, limits this patent's functional performance in all occupational domains, including functional ambulation and mobility, basic and instrumental ADLs (lower body dressing, LB bathing, fitting street shoes and LB clothing, driving, shopping, and home management. It also limits perform productive activities, leisure pursuits, and participation in social and community activities. BLE lymphedema contributes to elevated infection risk. Pt will benefit from skilled OT for Complete Decongestive Therapy (CDT), which  typically includes manual lymphatic drainage (MLD), skin care to limit' infection risk and increase skin excursion, lymphatic pumping exercise, and during the Intensive Phase multilayer, gradient compression bandaging to reduce limb volume. Once limb volume is reduced as much as possible, Pt is fitted with appropriate compression garments and/ or devices and transitions into the self management phase of care consisting of follow long support PRN.    Pt understands that her fair prognosis will become poor without daily assistance with compression wrapping since she is unable to reach feet and legs to apply them herself. Pt assures  OT that she has a friend she believes is willing to assist her between OT sessions. )   OBJECTIVE IMPAIRMENTS: decreased standing and walking tolerance, activity tolerance, decreased balance, decreased knowledge of condition, decreased knowledge of use of DME, decreased mobility, decreased ROM, decreased  strength, increased edema, impaired flexibility, impaired sensation, impaired UE functional use, impaired vision/reception, pain, and chronic, progressive, BLE swelling and associated skin changes, at increased infection risk   ACTIVITY LIMITATIONS: Functional ambulation and mobility (lifting, carrying, steps/stairs, transfers, squatting) , Basic and instrumental ADLs, leisure pursuits, productive activities, social participation  PARTICIPATION LIMITATIONS: meal prep, cleaning, laundry, driving, shopping, yard work, and    PERSONAL FACTORS: Age, Fitness, Past/current experiences, Time since onset of injury/illness/exacerbation, 2+ co morbidities: OSA, HTN,  are also affecting patient's functional outcome.   REHAB POTENTIAL: Good  EVALUATION COMPLEXITY: Moderate   GOALS: Goals reviewed with patient? Yes  SHORT TERM GOALS: Target date: 4th OT Rx visit   Pt will demonstrate understanding of lymphedema precautions and prevention strategies with modified independence using a printed reference to identify at least 5 precautions and discussing how s/he may implement them into daily life to reduce risk of progression with extra time. Baseline:Max A Goal status: GOAL MET  2.  Pt will be able to apply multilayer, knee length, gradient, compression wraps to one leg at a time from toes to below knee with max caregiver assist to decrease limb volume, to limit infection risk, and to limit lymphedema progression.  Baseline: Dependent Goal status: GOAL MET  LONG TERM GOALS: Target date: 09/19/23  1.Given this patient's Intake score of TBA % on the Lymphedema Life Impact Scale (LLIS), patient will  experience a reduction of at least 5 points in her perceived level of functional impairment resulting from lymphedema to improve functional performance and quality of life (QOL). Baseline: TBA % Goal status:  GOAL DEFERRED  2.  Pt will achieve at least a 10% volume reduction in B legs to return limb to typical size and shape, to limit infection risk and LE progression, to decrease pain, to improve function. Baseline: Dependent Goal status: PROGRESSING. R LEG volume reduced by 14.7% since initially measured on 07/18/21  3.  Pt will obtain appropriate compression garments/devices and achieve modified independence (extra time + assistive devices) with donning/doffing to optimize limb volume reductions and limit LE progression over time. Baseline: Dependent Goal status: PROGRESSING. Pt obtained R garment alternative and independence with donning and doffing at home is increasing with practice.  4.During Intensive phase CDT, with max CG assistance, Pt will achieve at least 85% compliance with all adapted lymphedema self-care home program components, including daily skin care, compression wraps and /or garments, simple self MLD and lymphatic pumping therex to habituate LE self care protocol  into ADLs for optimal LE self-management over time. Baseline: Dependent Goal status: GOAL MET  PLAN:  OT FREQUENCY: 2 x/week  OT DURATION: 12 weeks  PLANNED INTERVENTIONS:compression bandaging, skin care,  97110-Therapeutic exercises, 97530- Therapeutic activity, 97535- Self Care, Manual lymph drainage, DME instructions, and fit with appropriate compression  PLAN FOR NEXT SESSION:  Manual therapy to LLE as established including MLD, skin care and compression Fit CircAid to LLE. Cont Pt edu for LE self-care   Zebedee Dec, MS, OTR/L, CLT-LANA 12/06/23 12:41 PM

## 2023-12-07 NOTE — Therapy (Signed)
 OUTPATIENT PHYSICAL THERAPY THORACOLUMBAR TREATMENT   Patient Name: Judith Arnold MRN: [id number] DOB:[birth year], 73 y.o., female Today's Date: 12/08/2023  END OF SESSION:  PT End of Session - 12/08/23 1011     Visit Number 19    Number of Visits 25    Date for Recertification  12/12/23    Progress Note Due on Visit 20    PT Start Time 1014    PT Stop Time 1059    PT Time Calculation (min) 45 min    Equipment Utilized During Treatment Gait belt    Activity Tolerance Patient tolerated treatment well;Patient limited by pain    Behavior During Therapy WFL for tasks assessed/performed          Past Medical History:  Diagnosis Date   Allergy    Arthritis    lower back, left shoulder   Asthma    Carpal tunnel syndrome on both sides    Dental bridge present    top   Dental crown present    multiple   Family history of adverse reaction to anesthesia    Father and sister - PONV   GERD (gastroesophageal reflux disease)    Glaucoma    Hypertension    Leaky heart valve    Pre-diabetes    Sleep apnea    Past Surgical History:  Procedure Laterality Date   CATARACT EXTRACTION W/PHACO Right 11/16/2016   Procedure: CATARACT EXTRACTION PHACO AND INTRAOCULAR LENS PLACEMENT (IOC)  right;  Surgeon: Mittie Gaskin, MD;  Location: [facility];  Service: Ophthalmology;  Laterality: Right;  pre diabetic latex sensitivity sleep apnea   CATARACT EXTRACTION W/PHACO Left 01/18/2017   Procedure: CATARACT EXTRACTION PHACO AND INTRAOCULAR LENS PLACEMENT (IOC) LEFT DIABETIC;  Surgeon: Mittie Gaskin, MD;  Location: [facility];  Service: Ophthalmology;  Laterality: Left;   COLONOSCOPY     COLONOSCOPY WITH PROPOFOL  N/A 04/09/2018   Procedure: COLONOSCOPY WITH PROPOFOL ;  Surgeon: Viktoria Lamar DASEN, MD;  Location: [facility];  Service: Endoscopy;  Laterality: N/A;   IRIDOTOMY / IRIDECTOMY Bilateral 2005   TOOTH EXTRACTION     Patient Active Problem List    Diagnosis Date Noted   Chronic bilateral low back pain with right-sided sciatica 09/29/2023   Hypertension associated with diabetes (HCC) 01/20/2023   Combined hyperlipidemia associated with type 2 diabetes mellitus (HCC) 01/20/2023   Intertrigo 11/01/2022   Stasis dermatitis of both legs 10/21/2022   Leg swelling 10/21/2022   Lymphedema of lower extremity 10/21/2022   Type 2 diabetes mellitus without complication, without long-term current use of insulin (HCC) 05/24/2022   Mixed hyperlipidemia 05/24/2022   Essential hypertension, benign 05/24/2022   Absolute anemia 05/24/2022   Gastroesophageal reflux disease without esophagitis 05/24/2022   Allergic rhinitis 05/24/2022   Asthma, allergic, moderate persistent, uncomplicated 05/24/2022   Bilateral chronic angle-closure glaucoma, indeterminate stage 05/24/2022   Airway hyperreactivity 08/29/2013   OSA on CPAP 08/29/2013    PCP: Fernand Fredy RAMAN, MD  REFERRING PROVIDER: Fernand Fredy, RAMAN, MD  REFERRING DIAG:  Diagnosis  G89.29,M54.41 (ICD-10-CM) - Chronic bilateral low back pain with right-sided sciatica    Rationale for Evaluation and Treatment: Rehabilitation  THERAPY DIAG:  Other low back pain  Difficulty in walking, not elsewhere classified  Pain in right hip  Muscle weakness (generalized)  Other lack of coordination  ONSET DATE: >3 years   SUBJECTIVE:  SUBJECTIVE STATEMENT:  Patient reports feeling like she is moving some better- able to get in and out of bed better and feeling a little stronger. Patient reports feeling a little better with the back.  Pt reports her back is more sore overall today-states having pain with walking.     From EVAL: Pt is a pleasant 73 y/o female presenting to PT eval for chronic LBP with R side hip pain. She reports onset of  LBP over 3 years ago. Primary site of pain is mostly felt in R hip and lower spine. Pt reports hx of arthritis in lower back and R hip. She is now starting to feel arthritis pain in her knees as well. Reports RLE is actually her good leg, has some issues with pain into groin region of LLE. Pt states strength in legs has been impacted and that her general mobility is worse. Pt can only ambulate comfortably by taking weight off of lower back by using her 4WW. She states not using 4WW so much for balance, but for reducing pain with gait. She is unable to stand up fully due to pain. Pt says she used to be very fit (took take karate, gymnastics years ago). She is also seeing OT for BLE lymphedema management. She endorses difficulty getting out of chairs, difficulty with steps, she must use a ramp to get in and out of her home. Pt reports my brain does not retain what I'm talking about, can lose train of thought.  PERTINENT HISTORY:  PMH per chart includes arthritis low back and left shoulder, bilateral carpal tunnel, asthma, glaucoma, HTN, leaky heart valve, sleep apnea, pre-diabetes, being seen by lymphedema specialist for BLE lymphedema   PAIN:  Are you having pain? LBP and R hip pain  Lowest pain level: 0/10  With standing: 2/10  Worst: 5-6/10   PRECAUTIONS: LATEX ALLERGY per chart, fall  RED FLAGS: None  WEIGHT BEARING RESTRICTIONS: No  FALLS:  Has patient fallen in last 6 months? No  LIVING ENVIRONMENT: Uses ramp to get into home due to difficulty with steps, has 4WW  PLOF: Independent  PATIENT GOALS:  Says due to back pain she is unable to reach my feet, has difficulty getting her socks on without a grabber, wants to be more flexible and stronger to increase ease with these activities and mobility    OBJECTIVE:  Note: Objective measures were completed at Evaluation unless otherwise noted.  DIAGNOSTIC FINDINGS:  No recent pertinent imaging available in chart  PATIENT  SURVEYS:  Modified Oswestry score: 34%  Interpretation of scores: Score Category Description  0-20% Minimal Disability The patient can cope with most living activities. Usually no treatment is indicated apart from advice on lifting, sitting and exercise  21-40% Moderate Disability The patient experiences more pain and difficulty with sitting, lifting and standing. Travel and social life are more difficult and they may be disabled from work. Personal care, sexual activity and sleeping are not grossly affected, and the patient can usually be managed by conservative means  41-60% Severe Disability Pain remains the main problem in this group, but activities of daily living are affected. These patients require a detailed investigation  61-80% Crippled Back pain impinges on all aspects of the patient's life. Positive intervention is required  81-100% Bed-bound  These patients are either bed-bound or exaggerating their symptoms  Bluford FORBES Zoe DELENA Karon DELENA, et al. Surgery versus conservative management of stable thoracolumbar fracture: the PRESTO feasibility RCT. [place] ([place]): [employer];  2021 Nov. (Health Technology Assessment, No. 25.62.) Appendix 3, Oswestry Disability Index category descriptors. Available from: [employer]el[employer]cz  Minimally Clinically Important Difference (MCID) = 12.8%  COGNITION: Overall cognitive status: Within functional limits for tasks assessed, very pleasant pt can become tangential and requires redirection to activity    SENSATION: Intact to light touch BLE   MUSCLE LENGTH: deferred  POSTURE: increased thoracic kyphosis, rounded shoulders, unable to achieve full upright position due to pain, maintains increased hip and knee flexion when attempting to stand fully upright   PALPATION: deferred  Thoracolumbar AROM: Extension - unable to achieve neutral position, remains in significant flexion  Flexion - lacking at least 25%   Rotation - lacking at least 40% bilat  and pain-limited    LOWER EXTREMITY MMT:    MMT Right eval Left eval  Hip flexion 4- 3*  Hip extension    Hip abduction 4+ 4+  Hip adduction 4+ 4+  Hip internal rotation    Hip external rotation    Knee flexion 4 3*  Knee extension 4+ 4+  Ankle dorsiflexion 4 4+  Ankle plantarflexion    Ankle inversion    Ankle eversion     (Blank rows = not tested) *=pain limited   LUMBAR SPECIAL TESTS:  deferred  FUNCTIONAL TESTS:  10 meter walk test: 0.63 m/s crouched posture, decreased hip extension, decreased step-length and heel strike, elevated R>L shoulder, heavy BUE weightbearing on 4WW  GAIT: Distance walked: 10MWT/clinic distances (see above) Assistive device utilized: Environmental consultant - 4 wheeled Level of assistance: Modified independence Comments: gait mechanics impaired: decreased gait speed, crouched posture, decreased hip extension, decreased step-length and heel strike, elevated R>L shoulder, heavy BUE weightbearing on 4WW  TREATMENT DATE: 12/08/23   TherAct: To improve functional movements patterns for everyday tasks  Sit to stand 2x 10 reps from raised edge of mat without UE support (VC to lean forward)    TherEx: To improve strength, endurance, mobility, and function of specific targeted muscle groups or improve joint range of motion or improve muscle flexibility  Standing lumbar flex (patient reports doesn't hurt her back- just tough on the knees)   Attempted lying hip flexor stretch- Lying back on wedge on mat - only   Standing hip flexor stretch hold 30 sec x 3   Gait training:  Instructed in how to use an upright 4WW today for potential use to promote improved posture. Patient ambulated in clinic and instructed in how to operate brakes and seat. Patient then asked about weight to see if she could lift into a car. She reported the weight of the clinic walker was way too heavy She then ambulated over 500 feet total without  report of any increased back pain.   Self care/home management:  Researched light weight upright walker with patient and showed her some options including price and weight.  Recommended light weight upright walker today for optimal posture.    PATIENT EDUCATION:  Education details: Provided education on findings of exam, indications for plan, prognoses, goals, how PT can help                 Person educated: Patient Education method: Explanation Education comprehension: verbalized understanding  HOME EXERCISE PROGRAM: Access Code: [email or web address] URL: [email or web address] Date: 10/05/2023 Prepared by: Lonni Gainer  Exercises - Supine Bridge  - 3 x weekly - 3 sets - 10 reps - Clamshell  - 3 x weekly - 3 sets - 10 reps - Supine Lower Trunk Rotation  - 1  x daily - 3 sets - 30 sec hold - Seated Hamstring Stretch  - 1 x daily - 3 sets - 30 hold - Seated Clamshell  - 1 x daily - 7 x weekly - 2 sets - 20 reps - 3 sec hold - Supine Transversus Abdominis Bracing - Hands on Stomach  - 3 x weekly - 3 sets - 10 reps - Supine Gluteal Sets  - 3 x weekly - 3 sets - 10 reps  ASSESSMENT:  CLINICAL IMPRESSION:   Patient presents with improved overall pain today. She was educated in use of upright walker today. She performed well overall and responded without any increased pain and good use of walker without difficulty negotiating in clinic. She stated she liked the walker but it would be too heavy to lift into her car. Spent time looking up light weight options that were listed at 1/2 the weight as possible options and patient to look into more over the weekend.  Pt will continue to benefit from skilled therapy to address remaining deficits in order to improve overall QoL and return to PLOF.      OBJECTIVE IMPAIRMENTS: Abnormal gait, decreased activity tolerance, decreased balance, decreased mobility, difficulty walking, decreased strength, hypomobility, increased edema, improper body  mechanics, postural dysfunction, and pain.   ACTIVITY LIMITATIONS: carrying, lifting, bending, squatting, stairs, transfers, bed mobility, and locomotion level  PARTICIPATION LIMITATIONS: meal prep, cleaning, shopping, community activity, and yard work  PERSONAL FACTORS: Age, Fitness, Sex, Time since onset of injury/illness/exacerbation, and 3+ comorbidities: PMH per chart includes arthritis low back and left shoulder, bilateral carpal tunnel, asthma, glaucoma, HTN, leaky heart valve, sleep apnea, pre-diabetes, being seen by lymphedema specialist for BLE lymphedema  are also affecting patient's functional outcome.   REHAB POTENTIAL: Good  CLINICAL DECISION MAKING: Evolving/moderate complexity  EVALUATION COMPLEXITY: Moderate   GOALS: Goals reviewed with patient? Yes   SHORT TERM GOALS: Target date: 10/31/2023    Patient will be independent in home exercise program to improve strength/mobility for better functional independence with ADLs. Baseline:to be initiated; 11/06/2023=Patient reports knowledgeable of HEP but not always able to perform and sometimes painful so has to modify.  Goal status: MET   LONG TERM GOALS: Target date: 12/12/2023    Patient will reduce modified Oswestry score to <20 as to demonstrate minimal disability with ADLs including improved sleeping tolerance, walking/sitting tolerance etc for better mobility with ADLs.  Baseline: 34; 11/06/2023= 32 Goal status: INITIAL  2.  Patient (> 46 years old) will complete five times sit to stand test in < 15 seconds indicating an increased LE strength and improved balance. Baseline: 09/26/2023= 26.46 sec with heavy UE Support;  11/06/2023= 22.31 sec without UE Support Goal status: PROGRESSING  3.  Patient will increase Berg Balance score by > 6 points to demonstrate decreased fall risk during functional activities Baseline: 09/26/2023= 31/56; 11/06/2023= 40/56 Goal status: MET  4.  Patient will increase 10 meter walk test  to >1.18m/s as to improve gait speed for better community ambulation and to reduce fall risk. Baseline:  0.63 m/s with 4WW; 0.74 m/s Goal status: PROGRESSING      PLAN:  PT FREQUENCY: 1-2x/week  PT DURATION: 12 weeks  PLANNED INTERVENTIONS: [id number]- PT Re-evaluation, 97750- Physical Performance Testing, 97110-Therapeutic exercises, 97530- Therapeutic activity, [id number]- Neuromuscular re-education, 97535- Self Care, [id number]- Manual therapy, [id number]- Gait training, [phone]- Orthotic Initial, [phone]- Orthotic/Prosthetic subsequent, [phone]- Canalith repositioning, Patient/Family education, Balance training, Stair training, Taping, Joint mobilization, Spinal mobilization, Vestibular training, DME instructions,  Cryotherapy, and Moist heat.  PLAN FOR NEXT SESSION:  *Progress report/RECERT NEXT VISIT *Add goal for transition to gym based setting.  Continue to progress functional mobility in standing and seated if needed  Continue with some static and dynamic balance activities as appropriate. Manual therapy for lumbar ROM/pain Continue to progress HEP to activities that promote mobility with the least amount of pain as possible.   Chyrl London, PT Physical Therapist - [facility]  12/08/23, 11:38 AM

## 2023-12-08 ENCOUNTER — Ambulatory Visit: Admitting: Occupational Therapy

## 2023-12-08 ENCOUNTER — Ambulatory Visit

## 2023-12-08 ENCOUNTER — Encounter: Payer: Self-pay | Admitting: Occupational Therapy

## 2023-12-08 DIAGNOSIS — M6281 Muscle weakness (generalized): Secondary | ICD-10-CM | POA: Diagnosis not present

## 2023-12-08 DIAGNOSIS — M5459 Other low back pain: Secondary | ICD-10-CM

## 2023-12-08 DIAGNOSIS — R278 Other lack of coordination: Secondary | ICD-10-CM | POA: Diagnosis not present

## 2023-12-08 DIAGNOSIS — R262 Difficulty in walking, not elsewhere classified: Secondary | ICD-10-CM

## 2023-12-08 DIAGNOSIS — I890 Lymphedema, not elsewhere classified: Secondary | ICD-10-CM

## 2023-12-08 DIAGNOSIS — M25551 Pain in right hip: Secondary | ICD-10-CM

## 2023-12-08 NOTE — Therapy (Signed)
 OUTPATIENT OCCUPATIONAL THERAPY TREATMENT NOTE   BILATERAL LOWER EXTREMITY LYMPHEDEMA  Patient Name: Judith Arnold MRN: [id number] DOB:1950/11/04, 73 y.o., female Today's Date: 12/08/2023  REPORTING PERIOD: END OF SESSION:   OT End of Session - 12/08/23 1112     Visit Number 37    Number of Visits 72    Date for Recertification  01/16/24    OT Start Time 1105    OT Stop Time 1205    OT Time Calculation (min) 60 min    Activity Tolerance Patient tolerated treatment well;No increased pain    Behavior During Therapy WFL for tasks assessed/performed           Past Medical History:  Diagnosis Date   Allergy    Arthritis    lower back, left shoulder   Asthma    Carpal tunnel syndrome on both sides    Dental bridge present    top   Dental crown present    multiple   Family history of adverse reaction to anesthesia    Father and sister - PONV   GERD (gastroesophageal reflux disease)    Glaucoma    Hypertension    Leaky heart valve    Pre-diabetes    Sleep apnea    Past Surgical History:  Procedure Laterality Date   CATARACT EXTRACTION W/PHACO Right 11/16/2016   Procedure: CATARACT EXTRACTION PHACO AND INTRAOCULAR LENS PLACEMENT (IOC)  right;  Surgeon: Mittie Gaskin, MD;  Location: [facility];  Service: Ophthalmology;  Laterality: Right;  pre diabetic latex sensitivity sleep apnea   CATARACT EXTRACTION W/PHACO Left 01/18/2017   Procedure: CATARACT EXTRACTION PHACO AND INTRAOCULAR LENS PLACEMENT (IOC) LEFT DIABETIC;  Surgeon: Mittie Gaskin, MD;  Location: [facility];  Service: Ophthalmology;  Laterality: Left;   COLONOSCOPY     COLONOSCOPY WITH PROPOFOL  N/A 04/09/2018   Procedure: COLONOSCOPY WITH PROPOFOL ;  Surgeon: Viktoria Lamar DASEN, MD;  Location: [facility];  Service: Endoscopy;  Laterality: N/A;   IRIDOTOMY / IRIDECTOMY Bilateral 2005   TOOTH EXTRACTION     Patient Active Problem List   Diagnosis Date Noted   Chronic  bilateral low back pain with right-sided sciatica 09/29/2023   Hypertension associated with diabetes (HCC) 01/20/2023   Combined hyperlipidemia associated with type 2 diabetes mellitus (HCC) 01/20/2023   Intertrigo 11/01/2022   Stasis dermatitis of both legs 10/21/2022   Leg swelling 10/21/2022   Lymphedema of lower extremity 10/21/2022   Type 2 diabetes mellitus without complication, without long-term current use of insulin (HCC) 05/24/2022   Mixed hyperlipidemia 05/24/2022   Essential hypertension, benign 05/24/2022   Absolute anemia 05/24/2022   Gastroesophageal reflux disease without esophagitis 05/24/2022   Allergic rhinitis 05/24/2022   Asthma, allergic, moderate persistent, uncomplicated 05/24/2022   Bilateral chronic angle-closure glaucoma, indeterminate stage 05/24/2022   Airway hyperreactivity 08/29/2013   OSA on CPAP 08/29/2013    PCP: Fredy GORMAN Bathe, MD  REFERRING PROVIDER: same  REFERRING DIAG: lymphedema I89.0  THERAPY DIAG:  Lymphedema, not elsewhere classified  Rationale for Evaluation and Treatment: Rehabilitation  ONSET DATE: Pt reports onset of L ankle / leg swelling after injury 20-30 yrs ago. Then about 1 year ago legs started swelling and Pt having prickly sensation on shins and thighs bilaterally.   SUBJECTIVE:  SUBJECTIVE STATEMENT: Judith Arnold presents to OT for treatment of BLE lymphedema 2/2 suspected CVI and obesity. Pt is unaccompanied today. Pt denies lymphedema related pain in her legs today. Pt has no new complaints. Pt reports she attended PT prior to this OT session, and next session is her last PT appointment. Pt brings both Circaid compression stocking alternatives to clinic today for first time instead of wrapping.  (INITIAL EVAL 07/12/23 : Judith Arnold is referred to Occupational Therapy by Fredy GORMAN Bathe, MD,  for evaluation and treatment of BLE lymphedema.  Pt reports onset of L ankle and distal leg swelling about 30 years ago, then noticed RLE swelling and worsening LLE swelling about 1 year ago at same time she started having unusual sensation in legs. Pt denies previously having lymphedema treatment. Pt reports paternal aunt had leg swelling. Pt is unable to wear compression stockings because she is unable to reach feet and distal legs to don and doff them due to back and hip pain.  PERTINENT HISTORY: Relevant to lymphedema (OSA (denies using CPAP), BLE stasis dermatitis, HTN, DM 2 (Pt states she is prediabetic only), B glaucoma, Back and R hip OA  PAIN:  Are you having LE-related pain? Yes: NPRS scale: 0/10 Pain location: medial proximal thighs w abduction, bilateral groin, buttocks Pain description: like stickers Aggravating factors: standing, walking, dependent sitting Relieving factors: elevation  PRECAUTIONS: Fall and Other: LYMPHEDEMA precautions  (DM 2 and asthma)  WEIGHT BEARING RESTRICTIONS: No  FALLS:  Has patient fallen in last 6 months? yes Fell asleep at the computer and fell onto the floor [place] when storm door hit me  LIVING ENVIRONMENT: Lives with: alone Lives in: House/apartment Stairs: No;  Has following equipment at home: Environmental consultant - 4 wheeled  OCCUPATION: retired Scientist, research (life sciences)  LEISURE: reading and taking notes on current events and politics  HAND DOMINANCE: right   PRIOR LEVEL OF FUNCTION: Independent with household mobility with device, Requires assistive device for independence, Needs assistance with ADLs, Needs assistance with homemaking, and Needs assistance with gait  PATIENT GOALS: reduce swelling and keep it from getting worse  OBJECTIVE: Note: Objective measures were completed at Evaluation unless otherwise noted.  COGNITION:  Overall cognitive status: impaired short term   memory   OBSERVATIONS / OTHER ASSESSMENTS: Mild, Stage II, BLE Lymphedema 2/2 suspected venous insufficiency and hx of obesity   SENSATION: Reports uncomfortable scratching, or sticker-like  sensation on anterior thighs   POSTURE: Raised walker handles 2 notches to increase upright spinal alignment when walking to limit falls risk. Handles need to be raised at least 1 hole more for safe upright posture when walking.  Upright sitting posture: head forward , shoulders forward and rounded- appears to be flexible kyphosis  LE ROM: WFL for ankles and knees. Pt reports limited hip abduction  LE MMT: [facility] FOR LYMPHEDEMA CARE. Presenting with generalized weakness and debility  LYMPHEDEMA ASSESSMENTS:   SURGERY TYPE/DATE: Non-cancer related limb swelling  HX INFECTIONS: positive for 1 episode cellulitis treated w antibiotics  Hx WOUNDS: denies   BLE COMPARATIVE LIMB VOLUMETRICS: INITIAL 07/19/23  LANDMARK RIGHT (dominant)  R LEG (A-D) 3525.1 ml  R THIGH (E-G) ml  R FULL LIMB (A-G) ml  Limb Volume differential (LVD)  %  Volume change since initial %  Volume change overall V  (Blank rows = not tested)  LANDMARK LEFT    L LEG (A-D) 3769.6 ml  L THIGH (E-G) ml  L FULL LIMB (A-G) ml  Limb Volume differential (LVD)  Limb Volume Differential (LVD) measures 6.5%, L>R.  Volume change since initial %  Volume change overall %    RLE COMPARATIVE LIMB VOLUMETRICS: VISIT 9 08/15/23  LANDMARK RIGHT (dominant)  R LEG (A-D) 3549.4 ml  R THIGH (E-G) ml  R FULL LIMB (A-G) ml  Limb Volume differential (LVD)  %  Volume change since initial R LEG volume reduced by 5.84% since initially measured on 07/18/21  Volume change overall V  (Blank rows = not tested)  RLE COMPARATIVE LIMB VOLUMETRICS: VISIT 19 10/03/23  LANDMARK RIGHT (dominant)  R LEG (A-D) 3549.4 ml  R THIGH (E-G) ml  R FULL LIMB (A-G) ml  Limb Volume differential (LVD)  %  Volume change since initial R LEG volume reduced by 14.7%  since initially measured on 07/18/21  Volume change overall V    BLE COMPARATIVE LIMB VOLUMETRICS: 29th Visit  LANDMARK RIGHT (dominant)  R LEG (A-D) 3438.1 ml  R THIGH (E-G) ml  R FULL LIMB (A-G) ml  Limb Volume differential (LVD)  %  Volume change since last measured on 10/03/23 (19th visit) DECREASED by 0.67%. R Leg limb volume stable.  Volume change overall Overall R Leg volume reduction = 2.5%. Goal not me to date  (Blank rows = not tested)  LANDMARK LEFT    L LEG (A-D) 4174.4  ml  L THIGH (E-G) ml  L FULL LIMB (A-G) ml  Limb Volume differential (LVD)  .  Volume change since initial on 07/19/23 L LEG volume INCREASED by 10.7% since initially measured on 07/19/23  Volume change overall L LEG volume INCREASED by 10.7& since initially measured on 07/19/23  (Blank rows = not tested)  Mild, Stage  II, Bilateral Lower Extremity Lymphedema 2/2 CVI and Obesity  Skin  Description Hyper-Keratosis Peau' de Orange Shiny Tight Fibrotic/ Indurated Fatty Doughy Spongy/ boggy       R>L x  x   Skin dry Flaky WNL Macerated   mildly      Color Redness Varicosities Blanching Hemosiderin Stain Mottled   x     x   Odor Malodorous Yeast Fungal infection  WNL      x   Temperature Warm Cool wnl    x     Pitting Edema   1+ 2+ 3+ 4+ Non-pitting         x   Girth Symmetrical Asymmetrical                   Distribution    R>L toes to groin    Stemmer Sign Positive Negative   +    Lymphorrhea History Of:  Present Absent     x    Wounds History Of Present Absent Venous Arterial Pressure Sheer     x        Signs of Infection Redness Warmth Erythema Acute Swelling Drainage Borders                    Sensation Light Touch Deep pressure Hypersensitivity   Present Impaired Present Impaired Absent Impaired   x Tactile  x  x     Nails WNL   Fungus nail dystrophy   x     Hair Growth Symmetrical Asymmetrical   x    Skin Creases Base of toes  Ankles   Base of Fingers knees        Abdominal pannus Thigh Lobules  Face/neck   x x  x      (  Blank rows = not tested)    LYMPHEDEMA LIFE IMPACT SCALE (LLIS): TBA   TREATMENT DATE:  Pt edu for lymphedema progress towards goals and self-care  LLE/LLQ MLD LLE multilayer gradient compression wraps; RLE Circaid with mod A  due to time constraints  PATIENT EDUCATION:  Continued Pt/ CG edu for lymphedema self care home program throughout session. Topics include outcome of comparative limb volumetrics- starting limb volume differentials (LVDs), technology and gradient techniques used for short stretch, multilayer compression wrapping, simple self-MLD, therapeutic lymphatic pumping exercises, skin/nail care, LE precautions, compression garment recommendations and specifications, wear and care schedule and compression garment donning / doffing w assistive devices. Discussed progress towards all OT goals since commencing CDT. Discussed detrimental impact of obesity on lower and upper extremity lymphedema over time. Reviewed OT goals for lymphedema care with Pt and discussed progress to date.  All questions answered to the Pt's satisfaction. Good return. Person educated: Patient  Education method: Explanation, Demonstration, and Handouts Education comprehension: verbalized understanding, returned demonstration, verbal cues required, and needs further education   HOME EXERCISE PROGRAM: BLE lymphatic pumping there ex using- 1 sets of 10 reps, each exercise in order-  1-2 x daily, bilaterally Simple self MLD 1 x daily Daily skin care to increase hydration, skin mobility and decrease infection risk- can be done during MLD During Intensive Phase CDT: Compression wraps 23/7 until limb volume reduction complete During Self-management Phase CDT: Fit with appropriate compression garments or alternatives. Consider BLE, knee length, Mediven, custom, CircAid , Velcro style leggings over soft cotton liners.  ASSESSMENT: CLINICAL IMPRESSION: For  time Pt denies LE-related leg pain today. Swelling bilaterally below the knees is well managed. Pt tolerated MLD to LLE utilizing functional inguinal LN, and typical non-cancer related lymphedema sequences. Pt tolerated fibrosis techniques to dense fibrosis at medial distal thigh without increased pain. Pt graduates out of multilayer wraps into Velcro wrap style CircAid leggings bilaterally. Pt still limited by back pain for donning and doffing first 2 pairs of straps and for gauging compression. She is able to perform those tasks for the top 3 pairs of straps. We discussed adding webbing loops to the end of the lower straps so she could possibly use a dressing stick to adjust them after partially closing them and slipping them on. Pt will practice this weekend. Cont as per POC.  (INITIAL EVAL 07/12/23: Judith Arnold is a 73 yo female presenting with chronic, progressive, BLE swelling 2/2 unknown etiology, which at first glance appears to be circulatory in nature due to skin coloring at distal legs where swelling is most invested. Pt is presently unable to wear traditional elastic compression garments due to difficulty donning and doffing them.  Chronic, progressive lymphedema with associated skin changes, including fibrosis, limits this patent's functional performance in all occupational domains, including functional ambulation and mobility, basic and instrumental ADLs (lower body dressing, LB bathing, fitting street shoes and LB clothing, driving, shopping, and home management. It also limits perform productive activities, leisure pursuits, and participation in social and community activities. BLE lymphedema contributes to elevated infection risk. Pt will benefit from skilled OT for Complete Decongestive Therapy (CDT), which  typically includes manual lymphatic drainage (MLD), skin care to limit' infection risk and increase skin excursion, lymphatic pumping exercise, and during the Intensive Phase  multilayer, gradient compression bandaging to reduce limb volume. Once limb volume is reduced as much as possible, Pt is fitted with appropriate compression garments and/ or devices and transitions into the self management  phase of care consisting of follow long support PRN.    Pt understands that her fair prognosis will become poor without daily assistance with compression wrapping since she is unable to reach feet and legs to apply them herself. Pt assures OT that she has a friend she believes is willing to assist her between OT sessions. )   OBJECTIVE IMPAIRMENTS: decreased standing and walking tolerance, activity tolerance, decreased balance, decreased knowledge of condition, decreased knowledge of use of DME, decreased mobility, decreased ROM, decreased strength, increased edema, impaired flexibility, impaired sensation, impaired UE functional use, impaired vision/reception, pain, and chronic, progressive, BLE swelling and associated skin changes, at increased infection risk   ACTIVITY LIMITATIONS: Functional ambulation and mobility (lifting, carrying, steps/stairs, transfers, squatting) , Basic and instrumental ADLs, leisure pursuits, productive activities, social participation  PARTICIPATION LIMITATIONS: meal prep, cleaning, laundry, driving, shopping, yard work, and    PERSONAL FACTORS: Age, Fitness, Past/current experiences, Time since onset of injury/illness/exacerbation, 2+ co morbidities: OSA, HTN,  are also affecting patient's functional outcome.   REHAB POTENTIAL: Good  EVALUATION COMPLEXITY: Moderate   GOALS: Goals reviewed with patient? Yes  SHORT TERM GOALS: Target date: 4th OT Rx visit   Pt will demonstrate understanding of lymphedema precautions and prevention strategies with modified independence using a printed reference to identify at least 5 precautions and discussing how s/he may implement them into daily life to reduce risk of progression with extra time. Baseline:Max  A Goal status: GOAL MET  2.  Pt will be able to apply multilayer, knee length, gradient, compression wraps to one leg at a time from toes to below knee with max caregiver assist to decrease limb volume, to limit infection risk, and to limit lymphedema progression.  Baseline: Dependent Goal status: GOAL MET  LONG TERM GOALS: Target date: 09/19/23  1.Given this patient's Intake score of TBA % on the Lymphedema Life Impact Scale (LLIS), patient will experience a reduction of at least 5 points in her perceived level of functional impairment resulting from lymphedema to improve functional performance and quality of life (QOL). Baseline: TBA % Goal status:  GOAL DEFERRED  2.  Pt will achieve at least a 10% volume reduction in B legs to return limb to typical size and shape, to limit infection risk and LE progression, to decrease pain, to improve function. Baseline: Dependent Goal status: PROGRESSING. R LEG volume reduced by 14.7% since initially measured on 07/18/21  3.  Pt will obtain appropriate compression garments/devices and achieve modified independence (extra time + assistive devices) with donning/doffing to optimize limb volume reductions and limit LE progression over time. Baseline: Dependent Goal status: PROGRESSING. Pt obtained R garment alternative and independence with donning and doffing at home is increasing with practice.  4.During Intensive phase CDT, with max CG assistance, Pt will achieve at least 85% compliance with all adapted lymphedema self-care home program components, including daily skin care, compression wraps and /or garments, simple self MLD and lymphatic pumping therex to habituate LE self care protocol  into ADLs for optimal LE self-management over time. Baseline: Dependent Goal status: GOAL MET  PLAN:  OT FREQUENCY: 2 x/week  OT DURATION: 12 weeks  PLANNED INTERVENTIONS:compression bandaging, skin care,  97110-Therapeutic exercises, 97530- Therapeutic activity,  97535- Self Care, Manual lymph drainage, DME instructions, and fit with appropriate compression  PLAN FOR NEXT SESSION:  Manual therapy to LLE as established including MLD, skin care and compression Fit CircAid to LLE. Cont Pt edu for LE self-care   Zebedee Dec, MS,  OTR/L, CLT-LANA 12/08/23 12:11 PM

## 2023-12-11 ENCOUNTER — Ambulatory Visit

## 2023-12-11 ENCOUNTER — Ambulatory Visit: Admitting: Occupational Therapy

## 2023-12-11 DIAGNOSIS — M25551 Pain in right hip: Secondary | ICD-10-CM

## 2023-12-11 DIAGNOSIS — M6281 Muscle weakness (generalized): Secondary | ICD-10-CM | POA: Diagnosis not present

## 2023-12-11 DIAGNOSIS — M5459 Other low back pain: Secondary | ICD-10-CM | POA: Diagnosis not present

## 2023-12-11 DIAGNOSIS — I890 Lymphedema, not elsewhere classified: Secondary | ICD-10-CM | POA: Diagnosis not present

## 2023-12-11 DIAGNOSIS — R262 Difficulty in walking, not elsewhere classified: Secondary | ICD-10-CM

## 2023-12-11 DIAGNOSIS — R278 Other lack of coordination: Secondary | ICD-10-CM

## 2023-12-11 NOTE — Therapy (Unsigned)
 OUTPATIENT PHYSICAL THERAPY THORACOLUMBAR TREATMENT/RECERT/Physical Therapy Progress Note   Dates of reporting period  11/06/2023   to   12/11/2023   Patient Name: Judith Arnold MRN: [id number] DOB:1950/06/26, 73 y.o., female Today's Date: 12/12/2023  END OF SESSION:  PT End of Session - 12/11/23 1622     Visit Number 20    Number of Visits 44    Date for Recertification  03/04/24    Progress Note Due on Visit 20    PT Start Time 1617    PT Stop Time 1704    PT Time Calculation (min) 47 min    Equipment Utilized During Treatment Gait belt    Activity Tolerance Patient tolerated treatment well;Patient limited by pain    Behavior During Therapy WFL for tasks assessed/performed          Past Medical History:  Diagnosis Date   Allergy    Arthritis    lower back, left shoulder   Asthma    Carpal tunnel syndrome on both sides    Dental bridge present    top   Dental crown present    multiple   Family history of adverse reaction to anesthesia    Father and sister - PONV   GERD (gastroesophageal reflux disease)    Glaucoma    Hypertension    Leaky heart valve    Pre-diabetes    Sleep apnea    Past Surgical History:  Procedure Laterality Date   CATARACT EXTRACTION W/PHACO Right 11/16/2016   Procedure: CATARACT EXTRACTION PHACO AND INTRAOCULAR LENS PLACEMENT (IOC)  right;  Surgeon: Mittie Gaskin, MD;  Location: [facility];  Service: Ophthalmology;  Laterality: Right;  pre diabetic latex sensitivity sleep apnea   CATARACT EXTRACTION W/PHACO Left 01/18/2017   Procedure: CATARACT EXTRACTION PHACO AND INTRAOCULAR LENS PLACEMENT (IOC) LEFT DIABETIC;  Surgeon: Mittie Gaskin, MD;  Location: [facility];  Service: Ophthalmology;  Laterality: Left;   COLONOSCOPY     COLONOSCOPY WITH PROPOFOL  N/A 04/09/2018   Procedure: COLONOSCOPY WITH PROPOFOL ;  Surgeon: Viktoria Lamar DASEN, MD;  Location: [facility];  Service: Endoscopy;  Laterality: N/A;    IRIDOTOMY / IRIDECTOMY Bilateral 2005   TOOTH EXTRACTION     Patient Active Problem List   Diagnosis Date Noted   Chronic bilateral low back pain with right-sided sciatica 09/29/2023   Hypertension associated with diabetes (HCC) 01/20/2023   Combined hyperlipidemia associated with type 2 diabetes mellitus (HCC) 01/20/2023   Intertrigo 11/01/2022   Stasis dermatitis of both legs 10/21/2022   Leg swelling 10/21/2022   Lymphedema of lower extremity 10/21/2022   Type 2 diabetes mellitus without complication, without long-term current use of insulin (HCC) 05/24/2022   Mixed hyperlipidemia 05/24/2022   Essential hypertension, benign 05/24/2022   Absolute anemia 05/24/2022   Gastroesophageal reflux disease without esophagitis 05/24/2022   Allergic rhinitis 05/24/2022   Asthma, allergic, moderate persistent, uncomplicated 05/24/2022   Bilateral chronic angle-closure glaucoma, indeterminate stage 05/24/2022   Airway hyperreactivity 08/29/2013   OSA on CPAP 08/29/2013    PCP: Fernand Fredy RAMAN, MD  REFERRING PROVIDER: Fernand Fredy, RAMAN, MD  REFERRING DIAG:  Diagnosis  G89.29,M54.41 (ICD-10-CM) - Chronic bilateral low back pain with right-sided sciatica    Rationale for Evaluation and Treatment: Rehabilitation  THERAPY DIAG:  Other low back pain - Plan: PT plan of care cert/re-cert  Difficulty in walking, not elsewhere classified - Plan: PT plan of care cert/re-cert  Pain in right hip - Plan: PT plan of care cert/re-cert  Muscle weakness (generalized) - Plan: PT plan of care cert/re-cert  Other lack of coordination - Plan: PT plan of care cert/re-cert  ONSET DATE: >3 years   SUBJECTIVE:                                                                                                                                                                       SUBJECTIVE STATEMENT:  Patient reports moving slower today- not sure exactly.     From EVAL: Pt is a pleasant 73 y/o female  presenting to PT eval for chronic LBP with R side hip pain. She reports onset of LBP over 3 years ago. Primary site of pain is mostly felt in R hip and lower spine. Pt reports hx of arthritis in lower back and R hip. She is now starting to feel arthritis pain in her knees as well. Reports RLE is actually her good leg, has some issues with pain into groin region of LLE. Pt states strength in legs has been impacted and that her general mobility is worse. Pt can only ambulate comfortably by taking weight off of lower back by using her 4WW. She states not using 4WW so much for balance, but for reducing pain with gait. She is unable to stand up fully due to pain. Pt says she used to be very fit (took take karate, gymnastics years ago). She is also seeing OT for BLE lymphedema management. She endorses difficulty getting out of chairs, difficulty with steps, she must use a ramp to get in and out of her home. Pt reports my brain does not retain what I'm talking about, can lose train of thought.  PERTINENT HISTORY:  PMH per chart includes arthritis low back and left shoulder, bilateral carpal tunnel, asthma, glaucoma, HTN, leaky heart valve, sleep apnea, pre-diabetes, being seen by lymphedema specialist for BLE lymphedema   PAIN:  Are you having pain? LBP and R hip pain  Lowest pain level: 0/10  With standing: 2/10  Worst: 5-6/10   PRECAUTIONS: LATEX ALLERGY per chart, fall  RED FLAGS: None  WEIGHT BEARING RESTRICTIONS: No  FALLS:  Has patient fallen in last 6 months? No  LIVING ENVIRONMENT: Uses ramp to get into home due to difficulty with steps, has 4WW  PLOF: Independent  PATIENT GOALS:  Says due to back pain she is unable to reach my feet, has difficulty getting her socks on without a grabber, wants to be more flexible and stronger to increase ease with these activities and mobility    OBJECTIVE:  Note: Objective measures were completed at Evaluation unless otherwise  noted.  DIAGNOSTIC FINDINGS:  No recent pertinent imaging available in chart  PATIENT SURVEYS:  Modified Oswestry score: 34%  Interpretation of scores: Score  Category Description  0-20% Minimal Disability The patient can cope with most living activities. Usually no treatment is indicated apart from advice on lifting, sitting and exercise  21-40% Moderate Disability The patient experiences more pain and difficulty with sitting, lifting and standing. Travel and social life are more difficult and they may be disabled from work. Personal care, sexual activity and sleeping are not grossly affected, and the patient can usually be managed by conservative means  41-60% Severe Disability Pain remains the main problem in this group, but activities of daily living are affected. These patients require a detailed investigation  61-80% Crippled Back pain impinges on all aspects of the patient's life. Positive intervention is required  81-100% Bed-bound  These patients are either bed-bound or exaggerating their symptoms  Bluford FORBES Zoe DELENA Karon DELENA, et al. Surgery versus conservative management of stable thoracolumbar fracture: the PRESTO feasibility RCT. [place]): [employer]; 2021 Nov. [facility] Technology Assessment, No. 25.62.) Appendix 3, Oswestry Disability Index category descriptors. Available from: FindJewelers.cz  Minimally Clinically Important Difference (MCID) = 12.8%  COGNITION: Overall cognitive status: Within functional limits for tasks assessed, very pleasant pt can become tangential and requires redirection to activity    SENSATION: Intact to light touch BLE   MUSCLE LENGTH: deferred  POSTURE: increased thoracic kyphosis, rounded shoulders, unable to achieve full upright position due to pain, maintains increased hip and knee flexion when attempting to stand fully upright   PALPATION: deferred  Thoracolumbar AROM: Extension - unable  to achieve neutral position, remains in significant flexion  Flexion - lacking at least 25%  Rotation - lacking at least 40% bilat  and pain-limited    LOWER EXTREMITY MMT:    MMT Right eval Left eval  Hip flexion 4- 3*  Hip extension    Hip abduction 4+ 4+  Hip adduction 4+ 4+  Hip internal rotation    Hip external rotation    Knee flexion 4 3*  Knee extension 4+ 4+  Ankle dorsiflexion 4 4+  Ankle plantarflexion    Ankle inversion    Ankle eversion     (Blank rows = not tested) *=pain limited   LUMBAR SPECIAL TESTS:  deferred  FUNCTIONAL TESTS:  10 meter walk test: 0.63 m/s crouched posture, decreased hip extension, decreased step-length and heel strike, elevated R>L shoulder, heavy BUE weightbearing on 4WW  GAIT: Distance walked: 10MWT/clinic distances (see above) Assistive device utilized: Environmental consultant - 4 wheeled Level of assistance: Modified independence Comments: gait mechanics impaired: decreased gait speed, crouched posture, decreased hip extension, decreased step-length and heel strike, elevated R>L shoulder, heavy BUE weightbearing on 4WW  TREATMENT DATE: 12/11/23      Physical therapy treatment session today consisted of completing assessment of goals and administration of testing as demonstrated and documented in flow sheet, treatment, and goals section of this note. Addition treatments may be found below.   Pt performed 5 time sit<>stand (5xSTS): 21.78 sec without UE support sec (>15 sec indicates increased fall risk)    10 Meter Walk Test: Patient instructed to walk 10 meters (32.8 ft) as quickly and as safely as possible at their normal speed x2 and at a fast speed x2. Time measured from 2 meter mark to 8 meter mark to accommodate ramp-up and ramp-down.  Normal speed 1: 0.66 m/s with rollator Normal speed 2: 0.66 m/s with rollator Normal speed 3: 0.68 m/s with upright 4WW Normal speed 4: 0.68 m/s with upright 4WW Average Normal speed: 0.67 m/s  Cut off  scores: <0.4 m/s = household Ambulator, 0.4-0.8 m/s = limited community Ambulator, >0.8 m/s = community Ambulator, >1.2 m/s = crossing a street, <1.0 = increased fall risk MCID 0.05 m/s (small), 0.13 m/s (moderate), 0.06 m/s (significant)  (ANPTA Core Set of Outcome Measures for Adults with Neurologic Conditions, 2018)   MODIFIED OSWESTRY DISABILITY SCALE  Date: 12/11/2023 Score  Pain intensity 3 =  Pain medication provides me with moderate relief from pain.  2. Personal care (washing, dressing, etc.) 1 =  I can take care of myself normally, but it increases my pain.  3. Lifting 1 = I can lift heavy weights, but it causes increased pain.  4. Walking 4 = I can only walk with crutches or a cane.  5. Sitting 1 =  I can only sit in my favorite chair as long as I like.  6. Standing 3 =  Pain prevents me from standing more than 1/2 hour.  7. Sleeping 1 = I can sleep well only by using pain medication.  8. Social Life 2 = Pain prevents me from participating in more energetic activities (eg. sports, dancing).  9. Traveling 1 =  I can travel anywhere, but it increases my pain.  10. Employment/ Homemaking 2 = I can perform most of my homemaking/job duties, but pain prevents me from performing more physically stressful activities (eg, lifting, vacuuming).  Total 19/50 = 38%   Interpretation of scores: Score Category Description  0-20% Minimal Disability The patient can cope with most living activities. Usually no treatment is indicated apart from advice on lifting, sitting and exercise  21-40% Moderate Disability The patient experiences more pain and difficulty with sitting, lifting and standing. Travel and social life are more difficult and they may be disabled from work. Personal care, sexual activity and sleeping are not grossly affected, and the patient can usually be managed by conservative means  41-60% Severe Disability Pain remains the main problem in this group, but activities of daily living are  affected. These patients require a detailed investigation  61-80% Crippled Back pain impinges on all aspects of the patient's life. Positive intervention is required  81-100% Bed-bound  These patients are either bed-bound or exaggerating their symptoms  Bluford FORBES Zoe DELENA Karon DELENA, et al. Surgery versus conservative management of stable thoracolumbar fracture: the PRESTO feasibility RCT. [place] ([place]): [employer]; 2021 Nov. [facility] Technology Assessment, No. 25.62.) Appendix 3, Oswestry Disability Index category descriptors. Available from: FindJewelers.cz  Minimally Clinically Important Difference (MCID) = 12.8%    TherAct: To improve functional movements patterns for everyday tasks  Walked over to Well zone to observe gym and discuss plan to add goal to transition from skilled PT care to gym based program.  Patient performed 6 min on Nustep - interval training to promote LE strength/cardioresp endurance. PT sets up intervention, adjusts intensity throughout and monitors pt for response. Cuing for SPM/speed, pt maintains SPM in 60s   PATIENT EDUCATION:  Education details: Provided education on findings of exam, indications for plan, prognoses, goals, how PT can help                 Person educated: Patient Education method: Explanation Education comprehension: verbalized understanding  HOME EXERCISE PROGRAM: Access Code: [email or web address] URL: [email or web address] Date: 10/05/2023 Prepared by: Lonni Gainer  Exercises - Supine Bridge  - 3 x weekly - 3 sets - 10 reps - Clamshell  - 3 x weekly - 3 sets - 10 reps - Supine Lower Trunk Rotation  -  1 x daily - 3 sets - 30 sec hold - Seated Hamstring Stretch  - 1 x daily - 3 sets - 30 hold - Seated Clamshell  - 1 x daily - 7 x weekly - 2 sets - 20 reps - 3 sec hold - Supine Transversus Abdominis Bracing - Hands on Stomach  - 3 x weekly - 3 sets - 10 reps - Supine Gluteal Sets  - 3 x  weekly - 3 sets - 10 reps  ASSESSMENT:  CLINICAL IMPRESSION:  Patient present for recert visit today.  She has progressed despite for low back symptoms- demonstrating improved 5x STS and 10 MWT. She subjectively reports that she feels she is doing better but her oswestry score declined. She is intersted in learning safe gym based exercises to continue to make gain and for long term plan after PT services end. Added goal to reflect this request and patient's condition has the potential to improve in response to therapy. Maximum improvement is yet to be obtained. The anticipated improvement is attainable and reasonable in a generally predictable time.   Pt will continue to benefit from skilled therapy to address remaining deficits in order to improve overall QoL and return to PLOF.      OBJECTIVE IMPAIRMENTS: Abnormal gait, decreased activity tolerance, decreased balance, decreased mobility, difficulty walking, decreased strength, hypomobility, increased edema, improper body mechanics, postural dysfunction, and pain.   ACTIVITY LIMITATIONS: carrying, lifting, bending, squatting, stairs, transfers, bed mobility, and locomotion level  PARTICIPATION LIMITATIONS: meal prep, cleaning, shopping, community activity, and yard work  PERSONAL FACTORS: Age, Fitness, Sex, Time since onset of injury/illness/exacerbation, and 3+ comorbidities: PMH per chart includes arthritis low back and left shoulder, bilateral carpal tunnel, asthma, glaucoma, HTN, leaky heart valve, sleep apnea, pre-diabetes, being seen by lymphedema specialist for BLE lymphedema  are also affecting patient's functional outcome.   REHAB POTENTIAL: Good  CLINICAL DECISION MAKING: Evolving/moderate complexity  EVALUATION COMPLEXITY: Moderate   GOALS: Goals reviewed with patient? Yes   SHORT TERM GOALS: Target date: 10/31/2023    Patient will be independent in home exercise program to improve strength/mobility for better functional  independence with ADLs. Baseline:to be initiated; 11/06/2023=Patient reports knowledgeable of HEP but not always able to perform and sometimes painful so has to modify.  Goal status: MET   LONG TERM GOALS: Target date: 03/04/2024    Patient will reduce modified Oswestry score to <20 as to demonstrate minimal disability with ADLs including improved sleeping tolerance, walking/sitting tolerance etc for better mobility with ADLs.  Baseline: 34; 11/06/2023= 32; 12/11/2023= 38% Goal status: ONGOING  2.  Patient (> 39 years old) will complete five times sit to stand test in < 15 seconds indicating an increased LE strength and improved balance. Baseline: 09/26/2023= 26.46 sec with heavy UE Support;  11/06/2023= 22.31 sec without UE Support; 12/11/2023 =21.78 sec without UE support Goal status: PROGRESSING  3.  Patient will increase Berg Balance score by > 6 points to demonstrate decreased fall risk during functional activities Baseline: 09/26/2023= 31/56; 11/06/2023= 40/56 Goal status: MET  4.  Patient will increase 10 meter walk test to >1.37m/s as to improve gait speed for better community ambulation and to reduce fall risk. Baseline:  0.63 m/s with 4WW; 0.74 m/s; 12/11/2203= 0.66 m/s with 4WW and 0.68 m/s with upright 4WW Goal status: PROGRESSING  5. Patient will transition from skilled PT services to independent with gym based program for long term plan for fitness and management of LE weakness/low back pain.  Baseline: Patient current engaged with outpatient PT services to assist with strength and low back pain management.  Goal status: NEW      PLAN:  PT FREQUENCY: 1-2x/week  PT DURATION: 12 weeks  PLANNED INTERVENTIONS: 97164- PT Re-evaluation, 97750- Physical Performance Testing, 97110-Therapeutic exercises, 97530- Therapeutic activity, [id number]- Neuromuscular re-education, 97535- Self Care, [id number]- Manual therapy, [id number]- Gait training, [phone]- Orthotic Initial, [phone]- Orthotic/Prosthetic  subsequent, [phone]- Canalith repositioning, Patient/Family education, Balance training, Stair training, Taping, Joint mobilization, Spinal mobilization, Vestibular training, DME instructions, Cryotherapy, and Moist heat.  PLAN FOR NEXT SESSION:   Well zone for LE strengthening and mobility training. Continue to progress functional mobility in standing and seated if needed  Continue with some static and dynamic balance activities as appropriate. Manual therapy for lumbar ROM/pain Continue to progress HEP to activities that promote mobility with the least amount of pain as possible.   Chyrl London, PT Physical Therapist - [facility]  12/12/23, 8:03 AM

## 2023-12-11 NOTE — Therapy (Signed)
 OUTPATIENT OCCUPATIONAL THERAPY TREATMENT NOTE   BILATERAL LOWER EXTREMITY LYMPHEDEMA  Patient Name: Judith Arnold MRN: [id number] DOB:[birth year], 73 y.o., female Today's Date: 12/11/2023  REPORTING PERIOD: END OF SESSION:   OT End of Session - 12/11/23 1505     Visit Number 38    Number of Visits 72    Date for Recertification  01/16/24    OT Start Time 0300    OT Stop Time 0400    OT Time Calculation (min) 60 min    Activity Tolerance Patient tolerated treatment well;No increased pain    Behavior During Therapy WFL for tasks assessed/performed           Past Medical History:  Diagnosis Date   Allergy    Arthritis    lower back, left shoulder   Asthma    Carpal tunnel syndrome on both sides    Dental bridge present    top   Dental crown present    multiple   Family history of adverse reaction to anesthesia    Father and sister - PONV   GERD (gastroesophageal reflux disease)    Glaucoma    Hypertension    Leaky heart valve    Pre-diabetes    Sleep apnea    Past Surgical History:  Procedure Laterality Date   CATARACT EXTRACTION W/PHACO Right 11/16/2016   Procedure: CATARACT EXTRACTION PHACO AND INTRAOCULAR LENS PLACEMENT (IOC)  right;  Surgeon: Mittie Gaskin, MD;  Location: [facility];  Service: Ophthalmology;  Laterality: Right;  pre diabetic latex sensitivity sleep apnea   CATARACT EXTRACTION W/PHACO Left 01/18/2017   Procedure: CATARACT EXTRACTION PHACO AND INTRAOCULAR LENS PLACEMENT (IOC) LEFT DIABETIC;  Surgeon: Mittie Gaskin, MD;  Location: [facility];  Service: Ophthalmology;  Laterality: Left;   COLONOSCOPY     COLONOSCOPY WITH PROPOFOL  N/A 04/09/2018   Procedure: COLONOSCOPY WITH PROPOFOL ;  Surgeon: Viktoria Lamar DASEN, MD;  Location: [facility];  Service: Endoscopy;  Laterality: N/A;   IRIDOTOMY / IRIDECTOMY Bilateral 2005   TOOTH EXTRACTION     Patient Active Problem List   Diagnosis Date Noted   Chronic  bilateral low back pain with right-sided sciatica 09/29/2023   Hypertension associated with diabetes (HCC) 01/20/2023   Combined hyperlipidemia associated with type 2 diabetes mellitus (HCC) 01/20/2023   Intertrigo 11/01/2022   Stasis dermatitis of both legs 10/21/2022   Leg swelling 10/21/2022   Lymphedema of lower extremity 10/21/2022   Type 2 diabetes mellitus without complication, without long-term current use of insulin (HCC) 05/24/2022   Mixed hyperlipidemia 05/24/2022   Essential hypertension, benign 05/24/2022   Absolute anemia 05/24/2022   Gastroesophageal reflux disease without esophagitis 05/24/2022   Allergic rhinitis 05/24/2022   Asthma, allergic, moderate persistent, uncomplicated 05/24/2022   Bilateral chronic angle-closure glaucoma, indeterminate stage 05/24/2022   Airway hyperreactivity 08/29/2013   OSA on CPAP 08/29/2013    PCP: Fredy GORMAN Bathe, MD  REFERRING PROVIDER: same  REFERRING DIAG: lymphedema I89.0  THERAPY DIAG:  Lymphedema, not elsewhere classified  Rationale for Evaluation and Treatment: Rehabilitation  ONSET DATE: Pt reports onset of L ankle / leg swelling after injury 20-30 yrs ago. Then about 1 year ago legs started swelling and Pt having prickly sensation on shins and thighs bilaterally.   SUBJECTIVE:  SUBJECTIVE STATEMENT: Judith Arnold presents to OT for treatment of BLE lymphedema 2/2 suspected CVI and obesity. Pt is unaccompanied today. Pt denies lymphedema related pain in her legs today. Pt brings both Circaid compression stocking alternatives to clinic today. She reports that the woman she has been paying to assist her with compression continues to become more unreliable. Pt reports that she was not wearing compression since yesterday because her caregiver did  not come to assist as scheduled. Pt states she plans to discuss this with caregiver and try to problem solve with her.  Pt understands that she needs daily compression to retain clinical gains achieved in OT for LE care.  (INITIAL EVAL 07/12/23 : Judith Arnold is referred to Occupational Therapy by Fredy GORMAN Bathe, MD,  for evaluation and treatment of BLE lymphedema.  Pt reports onset of L ankle and distal leg swelling about 30 years ago, then noticed RLE swelling and worsening LLE swelling about 1 year ago at same time she started having unusual sensation in legs. Pt denies previously having lymphedema treatment. Pt reports paternal aunt had leg swelling. Pt is unable to wear compression stockings because she is unable to reach feet and distal legs to don and doff them due to back and hip pain.  PERTINENT HISTORY: Relevant to lymphedema (OSA (denies using CPAP), BLE stasis dermatitis, HTN, DM 2 (Pt states she is prediabetic only), B glaucoma, Back and R hip OA  PAIN:  Are you having LE-related pain? Yes: NPRS scale: 0/10 Pain location: medial proximal thighs w abduction, bilateral groin, buttocks Pain description: like stickers Aggravating factors: standing, walking, dependent sitting Relieving factors: elevation  PRECAUTIONS: Fall and Other: LYMPHEDEMA precautions  (DM 2 and asthma)  WEIGHT BEARING RESTRICTIONS: No  FALLS:  Has patient fallen in last 6 months? yes Fell asleep at the computer and fell onto the floor [place] when storm door hit me  LIVING ENVIRONMENT: Lives with: alone Lives in: House/apartment Stairs: No;  Has following equipment at home: Environmental consultant - 4 wheeled  OCCUPATION: retired Scientist, research (life sciences)  LEISURE: reading and taking notes on current events and politics  HAND DOMINANCE: right   PRIOR LEVEL OF FUNCTION: Independent with household mobility with device, Requires assistive device for independence, Needs assistance with ADLs, Needs assistance with  homemaking, and Needs assistance with gait  PATIENT GOALS: reduce swelling and keep it from getting worse  OBJECTIVE: Note: Objective measures were completed at Evaluation unless otherwise noted.  COGNITION:  Overall cognitive status: impaired short term  memory   OBSERVATIONS / OTHER ASSESSMENTS: Mild, Stage II, BLE Lymphedema 2/2 suspected venous insufficiency and hx of obesity   SENSATION: Reports uncomfortable scratching, or sticker-like  sensation on anterior thighs   POSTURE: Raised walker handles 2 notches to increase upright spinal alignment when walking to limit falls risk. Handles need to be raised at least 1 hole more for safe upright posture when walking.  Upright sitting posture: head forward , shoulders forward and rounded- appears to be flexible kyphosis  LE ROM: WFL for ankles and knees. Pt reports limited hip abduction  LE MMT: [facility]YMP[facility]EDEMA CARE. Presenting with generalized weakness and debility  LYMPHEDEMA ASSESSMENTS:   SURGERY TYPE/DATE: Non-cancer related limb swelling  HX INFECTIONS: positive for 1 episode cellulitis treated w antibiotics  Hx WOUNDS: denies   BLE COMPARATIVE LIMB VOLUMETRICS: INITIAL 07/19/23  LANDMARK RIGHT (dominant)  R LEG (A-D) 3525.1 ml  R THIGH (E-G) ml  R FULL LIMB (A-G) ml  Limb Volume  differential (LVD)  %  Volume change since initial %  Volume change overall V  (Blank rows = not tested)  LANDMARK LEFT    L LEG (A-D) 3769.6 ml  L THIGH (E-G) ml  L FULL LIMB (A-G) ml  Limb Volume differential (LVD)  Limb Volume Differential (LVD) measures 6.5%, L>R.  Volume change since initial %  Volume change overall %    RLE COMPARATIVE LIMB VOLUMETRICS: VISIT 9 08/15/23  LANDMARK RIGHT (dominant)  R LEG (A-D) 3549.4 ml  R THIGH (E-G) ml  R FULL LIMB (A-G) ml  Limb Volume differential (LVD)  %  Volume change since initial R LEG volume reduced by 5.84% since initially measured on 07/18/21  Volume change overall V  (Blank  rows = not tested)  RLE COMPARATIVE LIMB VOLUMETRICS: VISIT 19 10/03/23  LANDMARK RIGHT (dominant)  R LEG (A-D) 3549.4 ml  R THIGH (E-G) ml  R FULL LIMB (A-G) ml  Limb Volume differential (LVD)  %  Volume change since initial R LEG volume reduced by 14.7% since initially measured on 07/18/21  Volume change overall V    BLE COMPARATIVE LIMB VOLUMETRICS: 29th Visit  LANDMARK RIGHT (dominant)  R LEG (A-D) 3438.1 ml  R THIGH (E-G) ml  R FULL LIMB (A-G) ml  Limb Volume differential (LVD)  %  Volume change since last measured on 10/03/23 (19th visit) DECREASED by 0.67%. R Leg limb volume stable.  Volume change overall Overall R Leg volume reduction = 2.5%. Goal not me to date  (Blank rows = not tested)  LANDMARK LEFT    L LEG (A-D) 4174.4  ml  L THIGH (E-G) ml  L FULL LIMB (A-G) ml  Limb Volume differential (LVD)  .  Volume change since initial on 07/19/23 L LEG volume INCREASED by 10.7% since initially measured on 07/19/23  Volume change overall L LEG volume INCREASED by 10.7& since initially measured on 07/19/23  (Blank rows = not tested)  Mild, Stage  II, Bilateral Lower Extremity Lymphedema 2/2 CVI and Obesity  Skin  Description Hyper-Keratosis Peau' de Orange Shiny Tight Fibrotic/ Indurated Fatty Doughy Spongy/ boggy       R>L x  x   Skin dry Flaky WNL Macerated   mildly      Color Redness Varicosities Blanching Hemosiderin Stain Mottled   x     x   Odor Malodorous Yeast Fungal infection  WNL      x   Temperature Warm Cool wnl    x     Pitting Edema   1+ 2+ 3+ 4+ Non-pitting         x   Girth Symmetrical Asymmetrical                   Distribution    R>L toes to groin    Stemmer Sign Positive Negative   +    Lymphorrhea History Of:  Present Absent     x    Wounds History Of Present Absent Venous Arterial Pressure Sheer     x        Signs of Infection Redness Warmth Erythema Acute Swelling Drainage Borders                    Sensation Light Touch  Deep pressure Hypersensitivity   Present Impaired Present Impaired Absent Impaired   x Tactile  x  x     Nails WNL   Fungus nail dystrophy   x  Hair Growth Symmetrical Asymmetrical   x    Skin Creases Base of toes  Ankles   Base of Fingers knees       Abdominal pannus Thigh Lobules  Face/neck   x x  x      (Blank rows = not tested)    LYMPHEDEMA LIFE IMPACT SCALE (LLIS): TBA   TREATMENT DATE:  Pt edu for lymphedema progress towards goals and self-care  LLE/LLQ MLD LLE multilayer gradient compression wraps; RLE Circaid with mod A  due to time constraints  PATIENT EDUCATION:  Continued Pt/ CG edu for lymphedema self care home program throughout session. Topics include outcome of comparative limb volumetrics- starting limb volume differentials (LVDs), technology and gradient techniques used for short stretch, multilayer compression wrapping, simple self-MLD, therapeutic lymphatic pumping exercises, skin/nail care, LE precautions, compression garment recommendations and specifications, wear and care schedule and compression garment donning / doffing w assistive devices. Discussed progress towards all OT goals since commencing CDT. Discussed detrimental impact of obesity on lower and upper extremity lymphedema over time. Reviewed OT goals for lymphedema care with Pt and discussed progress to date.  All questions answered to the Pt's satisfaction. Good return. Person educated: Patient  Education method: Explanation, Demonstration, and Handouts Education comprehension: verbalized understanding, returned demonstration, verbal cues required, and needs further education   HOME EXERCISE PROGRAM: BLE lymphatic pumping there ex using- 1 sets of 10 reps, each exercise in order-  1-2 x daily, bilaterally Simple self MLD 1 x daily Daily skin care to increase hydration, skin mobility and decrease infection risk- can be done during MLD During Intensive Phase CDT: Compression wraps 23/7 until  limb volume reduction complete During Self-management Phase CDT: Fit with appropriate compression garments or alternatives. Consider BLE, knee length, Mediven, custom, CircAid , Velcro style leggings over soft cotton liners.  ASSESSMENT: CLINICAL IMPRESSION: For time Pt denies LE-related leg pain today. Swelling bilaterally below the knees is mildly increased. It has noticeably fluctuated for the past couple of weeks since transitioning to the Velcro wrap style garment alternative CircAids. . Pt tolerated MLD to LLE utilizing functional inguinal LN, and typical non-cancer related lymphedema sequences. Pt tolerated fibrosis techniques to dense fibrosis at medial distal thigh without increased pain. Pt graduates out of multilayer wraps into Velcro wrap style CircAid leggings bilaterally. Pt still limited by back pain for donning and doffing first 2 pairs of straps and for gauging compression. After this week reduce OT frequency from 2 x to 1 x weekly and begin transition to self-care phase of therapy. Cont as per POC.  (INITIAL EVAL 07/12/23: Judith Arnold is a 73 yo female presenting with chronic, progressive, BLE swelling 2/2 unknown etiology, which at first glance appears to be circulatory in nature due to skin coloring at distal legs where swelling is most invested. Pt is presently unable to wear traditional elastic compression garments due to difficulty donning and doffing them.  Chronic, progressive lymphedema with associated skin changes, including fibrosis, limits this patent's functional performance in all occupational domains, including functional ambulation and mobility, basic and instrumental ADLs (lower body dressing, LB bathing, fitting street shoes and LB clothing, driving, shopping, and home management. It also limits perform productive activities, leisure pursuits, and participation in social and community activities. BLE lymphedema contributes to elevated infection risk. Pt will  benefit from skilled OT for Complete Decongestive Therapy (CDT), which  typically includes manual lymphatic drainage (MLD), skin care to limit' infection risk and increase skin excursion, lymphatic pumping exercise,  and during the Intensive Phase multilayer, gradient compression bandaging to reduce limb volume. Once limb volume is reduced as much as possible, Pt is fitted with appropriate compression garments and/ or devices and transitions into the self management phase of care consisting of follow long support PRN.    Pt understands that her fair prognosis will become poor without daily assistance with compression wrapping since she is unable to reach feet and legs to apply them herself. Pt assures OT that she has a friend she believes is willing to assist her between OT sessions. )   OBJECTIVE IMPAIRMENTS: decreased standing and walking tolerance, activity tolerance, decreased balance, decreased knowledge of condition, decreased knowledge of use of DME, decreased mobility, decreased ROM, decreased strength, increased edema, impaired flexibility, impaired sensation, impaired UE functional use, impaired vision/reception, pain, and chronic, progressive, BLE swelling and associated skin changes, at increased infection risk   ACTIVITY LIMITATIONS: Functional ambulation and mobility (lifting, carrying, steps/stairs, transfers, squatting) , Basic and instrumental ADLs, leisure pursuits, productive activities, social participation  PARTICIPATION LIMITATIONS: meal prep, cleaning, laundry, driving, shopping, yard work, and    PERSONAL FACTORS: Age, Fitness, Past/current experiences, Time since onset of injury/illness/exacerbation, 2+ co morbidities: OSA, HTN,  are also affecting patient's functional outcome.   REHAB POTENTIAL: Good  EVALUATION COMPLEXITY: Moderate   GOALS: Goals reviewed with patient? Yes  SHORT TERM GOALS: Target date: 4th OT Rx visit   Pt will demonstrate understanding of lymphedema  precautions and prevention strategies with modified independence using a printed reference to identify at least 5 precautions and discussing how s/he may implement them into daily life to reduce risk of progression with extra time. Baseline:Max A Goal status: GOAL MET  2.  Pt will be able to apply multilayer, knee length, gradient, compression wraps to one leg at a time from toes to below knee with max caregiver assist to decrease limb volume, to limit infection risk, and to limit lymphedema progression.  Baseline: Dependent Goal status: GOAL MET  LONG TERM GOALS: Target date: 09/19/23  1.Given this patient's Intake score of TBA % on the Lymphedema Life Impact Scale (LLIS), patient will experience a reduction of at least 5 points in her perceived level of functional impairment resulting from lymphedema to improve functional performance and quality of life (QOL). Baseline: TBA % Goal status:  GOAL DEFERRED  2.  Pt will achieve at least a 10% volume reduction in B legs to return limb to typical size and shape, to limit infection risk and LE progression, to decrease pain, to improve function. Baseline: Dependent Goal status: PROGRESSING. R LEG volume reduced by 14.7% since initially measured on 07/18/21  3.  Pt will obtain appropriate compression garments/devices and achieve modified independence (extra time + assistive devices) with donning/doffing to optimize limb volume reductions and limit LE progression over time. Baseline: Dependent Goal status: PROGRESSING. Pt obtained R garment alternative and independence with donning and doffing at home is increasing with practice.  4.During Intensive phase CDT, with max CG assistance, Pt will achieve at least 85% compliance with all adapted lymphedema self-care home program components, including daily skin care, compression wraps and /or garments, simple self MLD and lymphatic pumping therex to habituate LE self care protocol  into ADLs for optimal LE  self-management over time. Baseline: Dependent Goal status: GOAL MET  PLAN:  OT FREQUENCY: 2 x/week  OT DURATION: 12 weeks  PLANNED INTERVENTIONS:compression bandaging, skin care,  97110-Therapeutic exercises, 97530- Therapeutic activity, 97535- Self Care, Manual lymph drainage, DME instructions,  and fit with appropriate compression  PLAN FOR NEXT SESSION:  Manual therapy to LLE as established including MLD, skin care and compression Fit CircAid to LLE. Cont Pt edu for LE self-care   Zebedee Dec, MS, OTR/L, CLT-LANA 12/11/23 4:02 PM

## 2023-12-13 ENCOUNTER — Ambulatory Visit: Attending: Internal Medicine

## 2023-12-13 ENCOUNTER — Ambulatory Visit: Admitting: Occupational Therapy

## 2023-12-13 DIAGNOSIS — I890 Lymphedema, not elsewhere classified: Secondary | ICD-10-CM

## 2023-12-13 DIAGNOSIS — M25551 Pain in right hip: Secondary | ICD-10-CM | POA: Insufficient documentation

## 2023-12-13 DIAGNOSIS — M5459 Other low back pain: Secondary | ICD-10-CM | POA: Diagnosis not present

## 2023-12-13 DIAGNOSIS — R278 Other lack of coordination: Secondary | ICD-10-CM | POA: Diagnosis not present

## 2023-12-13 DIAGNOSIS — R262 Difficulty in walking, not elsewhere classified: Secondary | ICD-10-CM | POA: Diagnosis not present

## 2023-12-13 DIAGNOSIS — M6281 Muscle weakness (generalized): Secondary | ICD-10-CM | POA: Diagnosis not present

## 2023-12-13 NOTE — Therapy (Signed)
 OUTPATIENT PHYSICAL THERAPY THORACOLUMBAR TREATMENT/RECERT/Physical Therapy Progress Note   Dates of reporting period  11/06/2023   to   12/11/2023   Patient Name: Judith Arnold MRN: [id number] DOB:04-Feb-1951, 73 y.o., female Today's Date: 12/13/2023  END OF SESSION:  PT End of Session - 12/13/23 1317     Visit Number 21    Number of Visits 44    Date for Recertification  03/04/24    Progress Note Due on Visit 30    PT Start Time 1315    Equipment Utilized During Treatment Gait belt    Activity Tolerance Patient tolerated treatment well;Patient limited by pain    Behavior During Therapy WFL for tasks assessed/performed          Past Medical History:  Diagnosis Date   Allergy    Arthritis    lower back, left shoulder   Asthma    Carpal tunnel syndrome on both sides    Dental bridge present    top   Dental crown present    multiple   Family history of adverse reaction to anesthesia    Father and sister - PONV   GERD (gastroesophageal reflux disease)    Glaucoma    Hypertension    Leaky heart valve    Pre-diabetes    Sleep apnea    Past Surgical History:  Procedure Laterality Date   CATARACT EXTRACTION W/PHACO Right 11/16/2016   Procedure: CATARACT EXTRACTION PHACO AND INTRAOCULAR LENS PLACEMENT (IOC)  right;  Surgeon: Mittie Gaskin, MD;  Location: [facility];  Service: Ophthalmology;  Laterality: Right;  pre diabetic latex sensitivity sleep apnea   CATARACT EXTRACTION W/PHACO Left 01/18/2017   Procedure: CATARACT EXTRACTION PHACO AND INTRAOCULAR LENS PLACEMENT (IOC) LEFT DIABETIC;  Surgeon: Mittie Gaskin, MD;  Location: [facility];  Service: Ophthalmology;  Laterality: Left;   COLONOSCOPY     COLONOSCOPY WITH PROPOFOL  N/A 04/09/2018   Procedure: COLONOSCOPY WITH PROPOFOL ;  Surgeon: Viktoria Lamar DASEN, MD;  Location: [facility];  Service: Endoscopy;  Laterality: N/A;   IRIDOTOMY / IRIDECTOMY Bilateral 2005   TOOTH EXTRACTION      Patient Active Problem List   Diagnosis Date Noted   Chronic bilateral low back pain with right-sided sciatica 09/29/2023   Hypertension associated with diabetes (HCC) 01/20/2023   Combined hyperlipidemia associated with type 2 diabetes mellitus (HCC) 01/20/2023   Intertrigo 11/01/2022   Stasis dermatitis of both legs 10/21/2022   Leg swelling 10/21/2022   Lymphedema of lower extremity 10/21/2022   Type 2 diabetes mellitus without complication, without long-term current use of insulin (HCC) 05/24/2022   Mixed hyperlipidemia 05/24/2022   Essential hypertension, benign 05/24/2022   Absolute anemia 05/24/2022   Gastroesophageal reflux disease without esophagitis 05/24/2022   Allergic rhinitis 05/24/2022   Asthma, allergic, moderate persistent, uncomplicated 05/24/2022   Bilateral chronic angle-closure glaucoma, indeterminate stage 05/24/2022   Airway hyperreactivity 08/29/2013   OSA on CPAP 08/29/2013    PCP: Fernand Fredy RAMAN, MD  REFERRING PROVIDER: Fernand Fredy, RAMAN, MD  REFERRING DIAG:  Diagnosis  G89.29,M54.41 (ICD-10-CM) - Chronic bilateral low back pain with right-sided sciatica    Rationale for Evaluation and Treatment: Rehabilitation  THERAPY DIAG:  Other low back pain  Difficulty in walking, not elsewhere classified  Pain in right hip  Muscle weakness (generalized)  Other lack of coordination  ONSET DATE: >3 years   SUBJECTIVE:  SUBJECTIVE STATEMENT:  Patient reports moving slower today- not sure exactly.     From EVAL: Pt is a pleasant 73 y/o female presenting to PT eval for chronic LBP with R side hip pain. She reports onset of LBP over 3 years ago. Primary site of pain is mostly felt in R hip and lower spine. Pt reports hx of arthritis in lower back and R hip. She is now starting to feel  arthritis pain in her knees as well. Reports RLE is actually her good leg, has some issues with pain into groin region of LLE. Pt states strength in legs has been impacted and that her general mobility is worse. Pt can only ambulate comfortably by taking weight off of lower back by using her 4WW. She states not using 4WW so much for balance, but for reducing pain with gait. She is unable to stand up fully due to pain. Pt says she used to be very fit (took take karate, gymnastics years ago). She is also seeing OT for BLE lymphedema management. She endorses difficulty getting out of chairs, difficulty with steps, she must use a ramp to get in and out of her home. Pt reports my brain does not retain what I'm talking about, can lose train of thought.  PERTINENT HISTORY:  PMH per chart includes arthritis low back and left shoulder, bilateral carpal tunnel, asthma, glaucoma, HTN, leaky heart valve, sleep apnea, pre-diabetes, being seen by lymphedema specialist for BLE lymphedema   PAIN:  Are you having pain? LBP and R hip pain  Lowest pain level: 0/10  With standing: 2/10  Worst: 5-6/10   PRECAUTIONS: LATEX ALLERGY per chart, fall  RED FLAGS: None  WEIGHT BEARING RESTRICTIONS: No  FALLS:  Has patient fallen in last 6 months? No  LIVING ENVIRONMENT: Uses ramp to get into home due to difficulty with steps, has 4WW  PLOF: Independent  PATIENT GOALS:  Says due to back pain she is unable to reach my feet, has difficulty getting her socks on without a grabber, wants to be more flexible and stronger to increase ease with these activities and mobility    OBJECTIVE:  Note: Objective measures were completed at Evaluation unless otherwise noted.  DIAGNOSTIC FINDINGS:  No recent pertinent imaging available in chart  PATIENT SURVEYS:  Modified Oswestry score: 34%  Interpretation of scores: Score Category Description  0-20% Minimal Disability The patient can cope with most living  activities. Usually no treatment is indicated apart from advice on lifting, sitting and exercise  21-40% Moderate Disability The patient experiences more pain and difficulty with sitting, lifting and standing. Travel and social life are more difficult and they may be disabled from work. Personal care, sexual activity and sleeping are not grossly affected, and the patient can usually be managed by conservative means  41-60% Severe Disability Pain remains the main problem in this group, but activities of daily living are affected. These patients require a detailed investigation  61-80% Crippled Back pain impinges on all aspects of the patient's life. Positive intervention is required  81-100% Bed-bound  These patients are either bed-bound or exaggerating their symptoms  Bluford FORBES Zoe DELENA Karon DELENA, et al. Surgery versus conservative management of stable thoracolumbar fracture: the PRESTO feasibility RCT. [place]): [employer]; 2021 Nov. [facility] Technology Assessment, No. 25.62.) Appendix 3, Oswestry Disability Index category descriptors. Available from: FindJewelers.cz  Minimally Clinically Important Difference (MCID) = 12.8%  COGNITION: Overall cognitive status: Within functional limits for tasks assessed, very pleasant  pt can become tangential and requires redirection to activity    SENSATION: Intact to light touch BLE   MUSCLE LENGTH: deferred  POSTURE: increased thoracic kyphosis, rounded shoulders, unable to achieve full upright position due to pain, maintains increased hip and knee flexion when attempting to stand fully upright   PALPATION: deferred  Thoracolumbar AROM: Extension - unable to achieve neutral position, remains in significant flexion  Flexion - lacking at least 25%  Rotation - lacking at least 40% bilat  and pain-limited    LOWER EXTREMITY MMT:    MMT Right eval Left eval  Hip flexion 4- 3*  Hip extension    Hip  abduction 4+ 4+  Hip adduction 4+ 4+  Hip internal rotation    Hip external rotation    Knee flexion 4 3*  Knee extension 4+ 4+  Ankle dorsiflexion 4 4+  Ankle plantarflexion    Ankle inversion    Ankle eversion     (Blank rows = not tested) *=pain limited   LUMBAR SPECIAL TESTS:  deferred  FUNCTIONAL TESTS:  10 meter walk test: 0.63 m/s crouched posture, decreased hip extension, decreased step-length and heel strike, elevated R>L shoulder, heavy BUE weightbearing on 4WW  GAIT: Distance walked: 10MWT/clinic distances (see above) Assistive device utilized: Environmental consultant - 4 wheeled Level of assistance: Modified independence Comments: gait mechanics impaired: decreased gait speed, crouched posture, decreased hip extension, decreased step-length and heel strike, elevated R>L shoulder, heavy BUE weightbearing on 4WW  10 Meter Walk Test: Patient instructed to walk 10 meters (32.8 ft) as quickly and as safely as possible at their normal speed x2 and at a fast speed x2. Time measured from 2 meter mark to 8 meter mark to accommodate ramp-up and ramp-down.  Normal speed 1: 0.66 m/s with rollator Normal speed 2: 0.66 m/s with rollator Normal speed 3: 0.68 m/s with upright 4WW Normal speed 4: 0.68 m/s with upright 4WW Average Normal speed: 0.67 m/s  Cut off scores: <0.4 m/s = household Ambulator, 0.4-0.8 m/s = limited community Ambulator, >0.8 m/s = community Ambulator, >1.2 m/s = crossing a street, <1.0 = increased fall risk MCID 0.05 m/s (small), 0.13 m/s (moderate), 0.06 m/s (significant)  (ANPTA Core Set of Outcome Measures for Adults with Neurologic Conditions, 2018)   MODIFIED OSWESTRY DISABILITY SCALE  Date: 12/11/2023 Score  Pain intensity 3 =  Pain medication provides me with moderate relief from pain.  2. Personal care (washing, dressing, etc.) 1 =  I can take care of myself normally, but it increases my pain.  3. Lifting 1 = I can lift heavy weights, but it causes increased pain.   4. Walking 4 = I can only walk with crutches or a cane.  5. Sitting 1 =  I can only sit in my favorite chair as long as I like.  6. Standing 3 =  Pain prevents me from standing more than 1/2 hour.  7. Sleeping 1 = I can sleep well only by using pain medication.  8. Social Life 2 = Pain prevents me from participating in more energetic activities (eg. sports, dancing).  9. Traveling 1 =  I can travel anywhere, but it increases my pain.  10. Employment/ Homemaking 2 = I can perform most of my homemaking/job duties, but pain prevents me from performing more physically stressful activities (eg, lifting, vacuuming).  Total 19/50 = 38%   Interpretation of scores: Score Category Description  0-20% Minimal Disability The patient can cope with most living activities. Usually  no treatment is indicated apart from advice on lifting, sitting and exercise  21-40% Moderate Disability The patient experiences more pain and difficulty with sitting, lifting and standing. Travel and social life are more difficult and they may be disabled from work. Personal care, sexual activity and sleeping are not grossly affected, and the patient can usually be managed by conservative means  41-60% Severe Disability Pain remains the main problem in this group, but activities of daily living are affected. These patients require a detailed investigation  61-80% Crippled Back pain impinges on all aspects of the patient's life. Positive intervention is required  81-100% Bed-bound  These patients are either bed-bound or exaggerating their symptoms  Bluford FORBES Zoe DELENA Karon DELENA, et al. Surgery versus conservative management of stable thoracolumbar fracture: the PRESTO feasibility RCT. [place] ([place]): [employer]; 2021 Nov. [facility] Technology Assessment, No. 25.62.) Appendix 3, Oswestry Disability Index category descriptors. Available from: FindJewelers.cz  Minimally Clinically Important  Difference (MCID) = 12.8%   Pt performed 5 time sit<>stand (5xSTS): 21.78 sec without UE support sec (>15 sec indicates increased fall risk)      TREATMENT DATE: 12/13/23   NMR: Standing at dry erase support bar- focusing on postural awareness and standing erect- dynamic activity of reaching overhead for magnet letters and arranging in alphabetical order. Patient required several rest breaks to complete due to overall weakness and fatigue- x several min.   Side stepping along support bar- down and back x 6 x 10 with 2 seated rest break     TherAct: To improve functional movements patterns for everyday tasks  Sit to stand x 15 reps with min to No UE support  Walked over to Well zone to observe gym and discuss plan to add goal to transition from skilled PT care to gym based program.  Patient performed 6 min on Nustep - interval training to promote LE strength/cardioresp endurance. PT sets up intervention, adjusts intensity throughout and monitors pt for response. Cuing for SPM/speed, pt maintains SPM in 6s   PATIENT EDUCATION:  Education details: Provided education on findings of exam, indications for plan, prognoses, goals, how PT can help                 Person educated: Patient Education method: Explanation Education comprehension: verbalized understanding  HOME EXERCISE PROGRAM: Access Code: [email or web address] URL: [email or web address] Date: 10/05/2023 Prepared by: Lonni Gainer  Exercises - Supine Bridge  - 3 x weekly - 3 sets - 10 reps - Clamshell  - 3 x weekly - 3 sets - 10 reps - Supine Lower Trunk Rotation  - 1 x daily - 3 sets - 30 sec hold - Seated Hamstring Stretch  - 1 x daily - 3 sets - 30 hold - Seated Clamshell  - 1 x daily - 7 x weekly - 2 sets - 20 reps - 3 sec hold - Supine Transversus Abdominis Bracing - Hands on Stomach  - 3 x weekly - 3 sets - 10 reps - Supine Gluteal Sets  - 3 x weekly - 3 sets - 10 reps  ASSESSMENT:  CLINICAL  IMPRESSION:  Patient present for recert visit today.  She has progressed despite for low back symptoms- demonstrating improved 5x STS and 10 MWT. She subjectively reports that she feels she is doing better but her oswestry score declined. She is intersted in learning safe gym based exercises to continue to make gain and for long term plan after PT services end. Added goal to reflect this  request and patient's condition has the potential to improve in response to therapy. Maximum improvement is yet to be obtained. The anticipated improvement is attainable and reasonable in a generally predictable time.   Pt will continue to benefit from skilled therapy to address remaining deficits in order to improve overall QoL and return to PLOF.      OBJECTIVE IMPAIRMENTS: Abnormal gait, decreased activity tolerance, decreased balance, decreased mobility, difficulty walking, decreased strength, hypomobility, increased edema, improper body mechanics, postural dysfunction, and pain.   ACTIVITY LIMITATIONS: carrying, lifting, bending, squatting, stairs, transfers, bed mobility, and locomotion level  PARTICIPATION LIMITATIONS: meal prep, cleaning, shopping, community activity, and yard work  PERSONAL FACTORS: Age, Fitness, Sex, Time since onset of injury/illness/exacerbation, and 3+ comorbidities: PMH per chart includes arthritis low back and left shoulder, bilateral carpal tunnel, asthma, glaucoma, HTN, leaky heart valve, sleep apnea, pre-diabetes, being seen by lymphedema specialist for BLE lymphedema  are also affecting patient's functional outcome.   REHAB POTENTIAL: Good  CLINICAL DECISION MAKING: Evolving/moderate complexity  EVALUATION COMPLEXITY: Moderate   GOALS: Goals reviewed with patient? Yes   SHORT TERM GOALS: Target date: 10/31/2023    Patient will be independent in home exercise program to improve strength/mobility for better functional independence with ADLs. Baseline:to be initiated;  11/06/2023=Patient reports knowledgeable of HEP but not always able to perform and sometimes painful so has to modify.  Goal status: MET   LONG TERM GOALS: Target date: 03/04/2024    Patient will reduce modified Oswestry score to <20 as to demonstrate minimal disability with ADLs including improved sleeping tolerance, walking/sitting tolerance etc for better mobility with ADLs.  Baseline: 34; 11/06/2023= 32; 12/11/2023= 38% Goal status: ONGOING  2.  Patient (> 47 years old) will complete five times sit to stand test in < 15 seconds indicating an increased LE strength and improved balance. Baseline: 09/26/2023= 26.46 sec with heavy UE Support;  11/06/2023= 22.31 sec without UE Support; 12/11/2023 =21.78 sec without UE support Goal status: PROGRESSING  3.  Patient will increase Berg Balance score by > 6 points to demonstrate decreased fall risk during functional activities Baseline: 09/26/2023= 31/56; 11/06/2023= 40/56 Goal status: MET  4.  Patient will increase 10 meter walk test to >1.40m/s as to improve gait speed for better community ambulation and to reduce fall risk. Baseline:  0.63 m/s with 4WW; 0.74 m/s; 12/11/2203= 0.66 m/s with 4WW and 0.68 m/s with upright 4WW Goal status: PROGRESSING  5. Patient will transition from skilled PT services to independent with gym based program for long term plan for fitness and management of LE weakness/low back pain.   Baseline: Patient current engaged with outpatient PT services to assist with strength and low back pain management.  Goal status: NEW      PLAN:  PT FREQUENCY: 1-2x/week  PT DURATION: 12 weeks  PLANNED INTERVENTIONS: [id number]- PT Re-evaluation, 97750- Physical Performance Testing, 97110-Therapeutic exercises, 97530- Therapeutic activity, [id number]- Neuromuscular re-education, 97535- Self Care, [id number]- Manual therapy, [id number]- Gait training, [phone]- Orthotic Initial, [phone]- Orthotic/Prosthetic subsequent, [phone]- Canalith repositioning,  Patient/Family education, Balance training, Stair training, Taping, Joint mobilization, Spinal mobilization, Vestibular training, DME instructions, Cryotherapy, and Moist heat.  PLAN FOR NEXT SESSION:   Well zone for LE strengthening and mobility training. Continue to progress functional mobility in standing and seated if needed  Continue with some static and dynamic balance activities as appropriate. Manual therapy for lumbar ROM/pain Continue to progress HEP to activities that promote mobility with the least amount of pain as possible.  Chyrl London, PT Physical Therapist - [facility]  12/13/23, 1:26 PM

## 2023-12-13 NOTE — Therapy (Signed)
 OUTPATIENT OCCUPATIONAL THERAPY TREATMENT NOTE   BILATERAL LOWER EXTREMITY LYMPHEDEMA  Patient Name: Judith Arnold MRN: [id number] DOB:18-Sep-1950, 73 y.o., female Today's Date: 12/13/2023  REPORTING PERIOD:  END OF SESSION:   OT End of Session - 12/13/23 1413     Visit Number 39    Number of Visits 72    Date for Recertification  01/16/24    OT Start Time 0200    OT Stop Time 0308    OT Time Calculation (min) 68 min    Activity Tolerance Patient tolerated treatment well;No increased pain    Behavior During Therapy WFL for tasks assessed/performed           Past Medical History:  Diagnosis Date   Allergy    Arthritis    lower back, left shoulder   Asthma    Carpal tunnel syndrome on both sides    Dental bridge present    top   Dental crown present    multiple   Family history of adverse reaction to anesthesia    Father and sister - PONV   GERD (gastroesophageal reflux disease)    Glaucoma    Hypertension    Leaky heart valve    Pre-diabetes    Sleep apnea    Past Surgical History:  Procedure Laterality Date   CATARACT EXTRACTION W/PHACO Right 11/16/2016   Procedure: CATARACT EXTRACTION PHACO AND INTRAOCULAR LENS PLACEMENT (IOC)  right;  Surgeon: Mittie Gaskin, MD;  Location: [facility];  Service: Ophthalmology;  Laterality: Right;  pre diabetic latex sensitivity sleep apnea   CATARACT EXTRACTION W/PHACO Left 01/18/2017   Procedure: CATARACT EXTRACTION PHACO AND INTRAOCULAR LENS PLACEMENT (IOC) LEFT DIABETIC;  Surgeon: Mittie Gaskin, MD;  Location: [facility];  Service: Ophthalmology;  Laterality: Left;   COLONOSCOPY     COLONOSCOPY WITH PROPOFOL  N/A 04/09/2018   Procedure: COLONOSCOPY WITH PROPOFOL ;  Surgeon: Viktoria Lamar DASEN, MD;  Location: [facility];  Service: Endoscopy;  Laterality: N/A;   IRIDOTOMY / IRIDECTOMY Bilateral 2005   TOOTH EXTRACTION     Patient Active Problem List   Diagnosis Date Noted   Chronic  bilateral low back pain with right-sided sciatica 09/29/2023   Hypertension associated with diabetes (HCC) 01/20/2023   Combined hyperlipidemia associated with type 2 diabetes mellitus (HCC) 01/20/2023   Intertrigo 11/01/2022   Stasis dermatitis of both legs 10/21/2022   Leg swelling 10/21/2022   Lymphedema of lower extremity 10/21/2022   Type 2 diabetes mellitus without complication, without long-term current use of insulin (HCC) 05/24/2022   Mixed hyperlipidemia 05/24/2022   Essential hypertension, benign 05/24/2022   Absolute anemia 05/24/2022   Gastroesophageal reflux disease without esophagitis 05/24/2022   Allergic rhinitis 05/24/2022   Asthma, allergic, moderate persistent, uncomplicated 05/24/2022   Bilateral chronic angle-closure glaucoma, indeterminate stage 05/24/2022   Airway hyperreactivity 08/29/2013   OSA on CPAP 08/29/2013    PCP: Fredy GORMAN Bathe, MD  REFERRING PROVIDER: same  REFERRING DIAG: lymphedema I89.0  THERAPY DIAG:  Lymphedema, not elsewhere classified  Rationale for Evaluation and Treatment: Rehabilitation  ONSET DATE: Pt reports onset of L ankle / leg swelling after injury 20-30 yrs ago. Then about 1 year ago legs started swelling and Pt having prickly sensation on shins and thighs bilaterally.   SUBJECTIVE:  SUBJECTIVE STATEMENT: Judith Arnold presents to OT for treatment of BLE lymphedema 2/2 suspected CVI and obesity. Pt is unaccompanied today. Pt denies lymphedema related pain in her legs. Pt brings both Circaid compression stocking alternatives to clinic today. She reports that the woman she has been paying to assist her with compression continues to become more unreliable. Pt had PT session before OT today and states, I hurt all over today'.  (INITIAL EVAL 07/12/23  : Judith Arnold is referred to Occupational Therapy by Fredy GORMAN Bathe, MD,  for evaluation and treatment of BLE lymphedema.  Pt reports onset of L ankle and distal leg swelling about 30 years ago, then noticed RLE swelling and worsening LLE swelling about 1 year ago at same time she started having unusual sensation in legs. Pt denies previously having lymphedema treatment. Pt reports paternal aunt had leg swelling. Pt is unable to wear compression stockings because she is unable to reach feet and distal legs to don and doff them due to back and hip pain.  PERTINENT HISTORY: Relevant to lymphedema (OSA (denies using CPAP), BLE stasis dermatitis, HTN, DM 2 (Pt states she is prediabetic only), B glaucoma, Back and R hip OA  PAIN:  Are you having LE-related pain? Yes: NPRS scale: 0/10 Pain location: medial proximal thighs w abduction, bilateral groin, buttocks Pain description: like stickers Aggravating factors: standing, walking, dependent sitting Relieving factors: elevation  PRECAUTIONS: Fall and Other: LYMPHEDEMA precautions  (DM 2 and asthma)  WEIGHT BEARING RESTRICTIONS: No  FALLS:  Has patient fallen in last 6 months? yes Fell asleep at the computer and fell onto the floor [place] when storm door hit me  LIVING ENVIRONMENT: Lives with: alone Lives in: House/apartment Stairs: No;  Has following equipment at home: Environmental consultant - 4 wheeled  OCCUPATION: retired Scientist, research (life sciences)  LEISURE: reading and taking notes on current events and politics  HAND DOMINANCE: right   PRIOR LEVEL OF FUNCTION: Independent with household mobility with device, Requires assistive device for independence, Needs assistance with ADLs, Needs assistance with homemaking, and Needs assistance with gait  PATIENT GOALS: reduce swelling and keep it from getting worse  OBJECTIVE: Note: Objective measures were completed at Evaluation unless otherwise noted.  COGNITION:  Overall cognitive status:  impaired short term  memory   OBSERVATIONS / OTHER ASSESSMENTS: Mild, Stage II, BLE Lymphedema 2/2 suspected venous insufficiency and hx of obesity   SENSATION: Reports uncomfortable scratching, or sticker-like  sensation on anterior thighs   POSTURE: Raised walker handles 2 notches to increase upright spinal alignment when walking to limit falls risk. Handles need to be raised at least 1 hole more for safe upright posture when walking.  Upright sitting posture: head forward , shoulders forward and rounded- appears to be flexible kyphosis  LE ROM: WFL for ankles and knees. Pt reports limited hip abduction  LE MMT: [facility]. Presenting with generalized weakness and debility  LYMPHEDEMA ASSESSMENTS:   SURGERY TYPE/DATE: Non-cancer related limb swelling  HX INFECTIONS: positive for 1 episode cellulitis treated w antibiotics  Hx WOUNDS: denies   BLE COMPARATIVE LIMB VOLUMETRICS: INITIAL 07/19/23  LANDMARK RIGHT (dominant)  R LEG (A-D) 3525.1 ml  R THIGH (E-G) ml  R FULL LIMB (A-G) ml  Limb Volume differential (LVD)  %  Volume change since initial %  Volume change overall V  (Blank rows = not tested)  LANDMARK LEFT    L LEG (A-D) 3769.6 ml  L THIGH (E-G) ml  L FULL LIMB (  A-G) ml  Limb Volume differential (LVD)  Limb Volume Differential (LVD) measures 6.5%, L>R.  Volume change since initial %  Volume change overall %    RLE COMPARATIVE LIMB VOLUMETRICS: VISIT 9 08/15/23  LANDMARK RIGHT (dominant)  R LEG (A-D) 3549.4 ml  R THIGH (E-G) ml  R FULL LIMB (A-G) ml  Limb Volume differential (LVD)  %  Volume change since initial R LEG volume reduced by 5.84% since initially measured on 07/18/21  Volume change overall V  (Blank rows = not tested)  RLE COMPARATIVE LIMB VOLUMETRICS: VISIT 19 10/03/23  LANDMARK RIGHT (dominant)  R LEG (A-D) 3549.4 ml  R THIGH (E-G) ml  R FULL LIMB (A-G) ml  Limb Volume differential (LVD)  %  Volume change since initial R LEG volume  reduced by 14.7% since initially measured on 07/18/21  Volume change overall V    BLE COMPARATIVE LIMB VOLUMETRICS: 29th Visit  LANDMARK RIGHT (dominant)  R LEG (A-D) 3438.1 ml  R THIGH (E-G) ml  R FULL LIMB (A-G) ml  Limb Volume differential (LVD)  %  Volume change since last measured on 10/03/23 (19th visit) DECREASED by 0.67%. R Leg limb volume stable.  Volume change overall Overall R Leg volume reduction = 2.5%. Goal not me to date  (Blank rows = not tested)  LANDMARK LEFT    L LEG (A-D) 4174.4  ml  L THIGH (E-G) ml  L FULL LIMB (A-G) ml  Limb Volume differential (LVD)  .  Volume change since initial on 07/19/23 L LEG volume INCREASED by 10.7% since initially measured on 07/19/23  Volume change overall L LEG volume INCREASED by 10.7& since initially measured on 07/19/23  (Blank rows = not tested)  Mild, Stage  II, Bilateral Lower Extremity Lymphedema 2/2 CVI and Obesity  Skin  Description Hyper-Keratosis Peau' de Orange Shiny Tight Fibrotic/ Indurated Fatty Doughy Spongy/ boggy       R>L x  x   Skin dry Flaky WNL Macerated   mildly      Color Redness Varicosities Blanching Hemosiderin Stain Mottled   x     x   Odor Malodorous Yeast Fungal infection  WNL      x   Temperature Warm Cool wnl    x     Pitting Edema   1+ 2+ 3+ 4+ Non-pitting         x   Girth Symmetrical Asymmetrical                   Distribution    R>L toes to groin    Stemmer Sign Positive Negative   +    Lymphorrhea History Of:  Present Absent     x    Wounds History Of Present Absent Venous Arterial Pressure Sheer     x        Signs of Infection Redness Warmth Erythema Acute Swelling Drainage Borders                    Sensation Light Touch Deep pressure Hypersensitivity   Present Impaired Present Impaired Absent Impaired   x Tactile  x  x     Nails WNL   Fungus nail dystrophy   x     Hair Growth Symmetrical Asymmetrical   x    Skin Creases Base of toes  Ankles   Base  of Fingers knees       Abdominal pannus Thigh Lobules  Face/neck   x x  x      (Blank rows = not tested)    LYMPHEDEMA LIFE IMPACT SCALE (LLIS): TBA   TREATMENT THIS DATE:  Pt edu for lymphedema progress towards goals and self-care  LLE/LLQ MLD BLE knee length Circaid leggings with 30-40 mmHg-Mod A to don 2/2 time constraints  PATIENT EDUCATION:  Continued Pt/ CG edu for lymphedema self care home program throughout session. Topics include outcome of comparative limb volumetrics- starting limb volume differentials (LVDs), technology and gradient techniques used for short stretch, multilayer compression wrapping, simple self-MLD, therapeutic lymphatic pumping exercises, skin/nail care, LE precautions, compression garment recommendations and specifications, wear and care schedule and compression garment donning / doffing w assistive devices. Discussed progress towards all OT goals since commencing CDT. Discussed detrimental impact of obesity on lower and upper extremity lymphedema over time. Reviewed OT goals for lymphedema care with Pt and discussed progress to date.  All questions answered to the Pt's satisfaction. Good return. Person educated: Patient  Education method: Explanation, Demonstration, and Handouts Education comprehension: verbalized understanding, returned demonstration, verbal cues required, and needs further education   HOME EXERCISE PROGRAM: BLE lymphatic pumping there ex using- 1 sets of 10 reps, each exercise in order-  1-2 x daily, bilaterally Simple self MLD 1 x daily Daily skin care to increase hydration, skin mobility and decrease infection risk- can be done during MLD During Intensive Phase CDT: Compression wraps 23/7 until limb volume reduction complete During Self-management Phase CDT: Fit with appropriate compression garments or alternatives. Consider BLE, knee length, Mediven, custom, CircAid , Velcro style leggings over soft cotton liners.  ASSESSMENT: CLINICAL  IMPRESSION: For time Pt denies LE-related leg pain today. Swelling bilaterally below the knees is mildly increased. It has noticeably fluctuated for the past couple of weeks since transitioning to the Velcro wrap style garment alternative CircAids. Pt tolerated MLD to LLE utilizing functional inguinal LN, and typical non-cancer related lymphedema sequences. Pt tolerated fibrosis techniques to dense fibrosis at medial distal thigh without increased pain.   Pt graduated out of multilayer wraps into Velcro wrap style CircAid leggings bilaterally last session as she transitions to self-management phase of CDT. After this week reduce OT frequency from 2 x to 1 x weekly and continue to support Pt PRN while she is working with paid caregiver to increase reliability. Cont as per POC.  (INITIAL EVAL 07/12/23: Srihitha Graylin Pizza is a 73 yo female presenting with chronic, progressive, BLE swelling 2/2 unknown etiology, which at first glance appears to be circulatory in nature due to skin coloring at distal legs where swelling is most invested. Pt is presently unable to wear traditional elastic compression garments due to difficulty donning and doffing them.  Chronic, progressive lymphedema with associated skin changes, including fibrosis, limits this patent's functional performance in all occupational domains, including functional ambulation and mobility, basic and instrumental ADLs (lower body dressing, LB bathing, fitting street shoes and LB clothing, driving, shopping, and home management. It also limits perform productive activities, leisure pursuits, and participation in social and community activities. BLE lymphedema contributes to elevated infection risk. Pt will benefit from skilled OT for Complete Decongestive Therapy (CDT), which  typically includes manual lymphatic drainage (MLD), skin care to limit' infection risk and increase skin excursion, lymphatic pumping exercise, and during the Intensive Phase  multilayer, gradient compression bandaging to reduce limb volume. Once limb volume is reduced as much as possible, Pt is fitted with appropriate compression garments and/ or devices and transitions into the self management phase of  care consisting of follow long support PRN.    Pt understands that her fair prognosis will become poor without daily assistance with compression wrapping since she is unable to reach feet and legs to apply them herself. Pt assures OT that she has a friend she believes is willing to assist her between OT sessions. )   OBJECTIVE IMPAIRMENTS: decreased standing and walking tolerance, activity tolerance, decreased balance, decreased knowledge of condition, decreased knowledge of use of DME, decreased mobility, decreased ROM, decreased strength, increased edema, impaired flexibility, impaired sensation, impaired UE functional use, impaired vision/reception, pain, and chronic, progressive, BLE swelling and associated skin changes, at increased infection risk   ACTIVITY LIMITATIONS: Functional ambulation and mobility (lifting, carrying, steps/stairs, transfers, squatting) , Basic and instrumental ADLs, leisure pursuits, productive activities, social participation  PARTICIPATION LIMITATIONS: meal prep, cleaning, laundry, driving, shopping, yard work, and    PERSONAL FACTORS: Age, Fitness, Past/current experiences, Time since onset of injury/illness/exacerbation, 2+ co morbidities: OSA, HTN,  are also affecting patient's functional outcome.   REHAB POTENTIAL: Good  EVALUATION COMPLEXITY: Moderate   GOALS: Goals reviewed with patient? Yes  SHORT TERM GOALS: Target date: 4th OT Rx visit   Pt will demonstrate understanding of lymphedema precautions and prevention strategies with modified independence using a printed reference to identify at least 5 precautions and discussing how s/he may implement them into daily life to reduce risk of progression with extra time. Baseline:Max  A Goal status: GOAL MET  2.  Pt will be able to apply multilayer, knee length, gradient, compression wraps to one leg at a time from toes to below knee with max caregiver assist to decrease limb volume, to limit infection risk, and to limit lymphedema progression.  Baseline: Dependent Goal status: GOAL MET  LONG TERM GOALS: Target date: 09/19/23  1.Given this patient's Intake score of TBA % on the Lymphedema Life Impact Scale (LLIS), patient will experience a reduction of at least 5 points in her perceived level of functional impairment resulting from lymphedema to improve functional performance and quality of life (QOL). Baseline: TBA % Goal status:  GOAL DEFERRED  2.  Pt will achieve at least a 10% volume reduction in B legs to return limb to typical size and shape, to limit infection risk and LE progression, to decrease pain, to improve function. Baseline: Dependent Goal status: PROGRESSING. R LEG volume reduced by 14.7% since initially measured on 07/18/21  3.  Pt will obtain appropriate compression garments/devices and achieve modified independence (extra time + assistive devices) with donning/doffing to optimize limb volume reductions and limit LE progression over time. Baseline: Dependent Goal status: PROGRESSING. Pt obtained BLE, Velcro wrap style, knee length, CircAid elastic  garment alternatives , which are replacing multilayer compression wraps. Pt required moderate assistance to don and gauge these garment alternatives. She is independent with doffing them.   4.During Intensive phase CDT, with max CG assistance, Pt will achieve at least 85% compliance with all adapted lymphedema self-care home program components, including daily skin care, compression wraps and /or garments, simple self MLD and lymphatic pumping therex to habituate LE self care protocol  into ADLs for optimal LE self-management over time. Baseline: Dependent Goal status: GOAL MET  PLAN:  OT FREQUENCY: reduce OT  frequency 1 x month for 4 weeks to support transition to LE self-care  OT DURATION: 12 weeks  PLANNED INTERVENTIONS:compression bandaging, skin care,  97110-Therapeutic exercises, 97530- Therapeutic activity, 97535- Self Care, Manual lymph drainage, DME instructions, and fit with appropriate compression  PLAN FOR NEXT SESSION:  Manual therapy to LLE as established including MLD, skin care and compression Fit CircAid to LLE. Cont Pt edu for LE self-care   Zebedee Dec, MS, OTR/L, CLT-LANA 12/13/23 3:17 PM

## 2023-12-18 ENCOUNTER — Ambulatory Visit: Admitting: Occupational Therapy

## 2023-12-18 ENCOUNTER — Ambulatory Visit

## 2023-12-18 DIAGNOSIS — M6281 Muscle weakness (generalized): Secondary | ICD-10-CM

## 2023-12-18 DIAGNOSIS — R262 Difficulty in walking, not elsewhere classified: Secondary | ICD-10-CM | POA: Diagnosis not present

## 2023-12-18 DIAGNOSIS — R278 Other lack of coordination: Secondary | ICD-10-CM | POA: Diagnosis not present

## 2023-12-18 DIAGNOSIS — I890 Lymphedema, not elsewhere classified: Secondary | ICD-10-CM

## 2023-12-18 DIAGNOSIS — M5459 Other low back pain: Secondary | ICD-10-CM

## 2023-12-18 DIAGNOSIS — M25551 Pain in right hip: Secondary | ICD-10-CM | POA: Diagnosis not present

## 2023-12-18 NOTE — Therapy (Signed)
 OUTPATIENT PHYSICAL THERAPY THORACOLUMBAR TREATMENT   Patient Name: PAHOLA DIMMITT MRN: [id number] DOB:July 01, 1950, 73 y.o., female Today's Date: 12/19/2023  END OF SESSION:  PT End of Session - 12/18/23 1308     Visit Number 22    Number of Visits 44    Date for Recertification  03/04/24    Progress Note Due on Visit 30    PT Start Time 1315    PT Stop Time 1400    PT Time Calculation (min) 45 min    Equipment Utilized During Treatment Gait belt    Activity Tolerance Patient tolerated treatment well;Patient limited by pain    Behavior During Therapy WFL for tasks assessed/performed           Past Medical History:  Diagnosis Date   Allergy    Arthritis    lower back, left shoulder   Asthma    Carpal tunnel syndrome on both sides    Dental bridge present    top   Dental crown present    multiple   Family history of adverse reaction to anesthesia    Father and sister - PONV   GERD (gastroesophageal reflux disease)    Glaucoma    Hypertension    Leaky heart valve    Pre-diabetes    Sleep apnea    Past Surgical History:  Procedure Laterality Date   CATARACT EXTRACTION W/PHACO Right 11/16/2016   Procedure: CATARACT EXTRACTION PHACO AND INTRAOCULAR LENS PLACEMENT (IOC)  right;  Surgeon: Mittie Gaskin, MD;  Location: [facility];  Service: Ophthalmology;  Laterality: Right;  pre diabetic latex sensitivity sleep apnea   CATARACT EXTRACTION W/PHACO Left 01/18/2017   Procedure: CATARACT EXTRACTION PHACO AND INTRAOCULAR LENS PLACEMENT (IOC) LEFT DIABETIC;  Surgeon: Mittie Gaskin, MD;  Location: [facility];  Service: Ophthalmology;  Laterality: Left;   COLONOSCOPY     COLONOSCOPY WITH PROPOFOL  N/A 04/09/2018   Procedure: COLONOSCOPY WITH PROPOFOL ;  Surgeon: Viktoria Lamar DASEN, MD;  Location: [facility] END[facility];  Service: Endoscopy;  Laterality: N/A;   IRIDOTOMY / IRIDECTOMY Bilateral 2005   TOOTH EXTRACTION     Patient Active Problem List    Diagnosis Date Noted   Chronic bilateral low back pain with right-sided sciatica 09/29/2023   Hypertension associated with diabetes (HCC) 01/20/2023   Combined hyperlipidemia associated with type 2 diabetes mellitus (HCC) 01/20/2023   Intertrigo 11/01/2022   Stasis dermatitis of both legs 10/21/2022   Leg swelling 10/21/2022   Lymphedema of lower extremity 10/21/2022   Type 2 diabetes mellitus without complication, without long-term current use of insulin (HCC) 05/24/2022   Mixed hyperlipidemia 05/24/2022   Essential hypertension, benign 05/24/2022   Absolute anemia 05/24/2022   Gastroesophageal reflux disease without esophagitis 05/24/2022   Allergic rhinitis 05/24/2022   Asthma, allergic, moderate persistent, uncomplicated 05/24/2022   Bilateral chronic angle-closure glaucoma, indeterminate stage 05/24/2022   Airway hyperreactivity 08/29/2013   OSA on CPAP 08/29/2013    PCP: Fernand Fredy RAMAN, MD  REFERRING PROVIDER: Fernand Fredy, RAMAN, MD  REFERRING DIAG:  Diagnosis  G89.29,M54.41 (ICD-10-CM) - Chronic bilateral low back pain with right-sided sciatica    Rationale for Evaluation and Treatment: Rehabilitation  THERAPY DIAG:  Other low back pain  Difficulty in walking, not elsewhere classified  Pain in right hip  Muscle weakness (generalized)  Other lack of coordination  ONSET DATE: >3 years   SUBJECTIVE:  SUBJECTIVE STATEMENT:  Patient reports moving slow but moving.      From EVAL: Pt is a pleasant 73 y/o female presenting to PT eval for chronic LBP with R side hip pain. She reports onset of LBP over 3 years ago. Primary site of pain is mostly felt in R hip and lower spine. Pt reports hx of arthritis in lower back and R hip. She is now starting to feel arthritis pain in her knees as well. Reports RLE is  actually her good leg, has some issues with pain into groin region of LLE. Pt states strength in legs has been impacted and that her general mobility is worse. Pt can only ambulate comfortably by taking weight off of lower back by using her 4WW. She states not using 4WW so much for balance, but for reducing pain with gait. She is unable to stand up fully due to pain. Pt says she used to be very fit (took take karate, gymnastics years ago). She is also seeing OT for BLE lymphedema management. She endorses difficulty getting out of chairs, difficulty with steps, she must use a ramp to get in and out of her home. Pt reports my brain does not retain what I'm talking about, can lose train of thought.  PERTINENT HISTORY:  PMH per chart includes arthritis low back and left shoulder, bilateral carpal tunnel, asthma, glaucoma, HTN, leaky heart valve, sleep apnea, pre-diabetes, being seen by lymphedema specialist for BLE lymphedema   PAIN:  Are you having pain? LBP and R hip pain  Lowest pain level: 0/10  With standing: 2/10  Worst: 5-6/10   PRECAUTIONS: LATEX ALLERGY per chart, fall  RED FLAGS: None  WEIGHT BEARING RESTRICTIONS: No  FALLS:  Has patient fallen in last 6 months? No  LIVING ENVIRONMENT: Uses ramp to get into home due to difficulty with steps, has 4WW  PLOF: Independent  PATIENT GOALS:  Says due to back pain she is unable to reach my feet, has difficulty getting her socks on without a grabber, wants to be more flexible and stronger to increase ease with these activities and mobility    OBJECTIVE:  Note: Objective measures were completed at Evaluation unless otherwise noted.  DIAGNOSTIC FINDINGS:  No recent pertinent imaging available in chart  PATIENT SURVEYS:  Modified Oswestry score: 34%  Interpretation of scores: Score Category Description  0-20% Minimal Disability The patient can cope with most living activities. Usually no treatment is indicated apart from  advice on lifting, sitting and exercise  21-40% Moderate Disability The patient experiences more pain and difficulty with sitting, lifting and standing. Travel and social life are more difficult and they may be disabled from work. Personal care, sexual activity and sleeping are not grossly affected, and the patient can usually be managed by conservative means  41-60% Severe Disability Pain remains the main problem in this group, but activities of daily living are affected. These patients require a detailed investigation  61-80% Crippled Back pain impinges on all aspects of the patient's life. Positive intervention is required  81-100% Bed-bound  These patients are either bed-bound or exaggerating their symptoms  Bluford FORBES Zoe DELENA Karon DELENA, et al. Surgery versus conservative management of stable thoracolumbar fracture: the PRESTO feasibility RCT. [place]): [employer]; 2021 Nov. [facility] Technology Assessment, No. 25.62.) Appendix 3, Oswestry Disability Index category descriptors. Available from: FindJewelers.cz  Minimally Clinically Important Difference (MCID) = 12.8%  COGNITION: Overall cognitive status: Within functional limits for tasks assessed, very pleasant pt  can become tangential and requires redirection to activity    SENSATION: Intact to light touch BLE   MUSCLE LENGTH: deferred  POSTURE: increased thoracic kyphosis, rounded shoulders, unable to achieve full upright position due to pain, maintains increased hip and knee flexion when attempting to stand fully upright   PALPATION: deferred  Thoracolumbar AROM: Extension - unable to achieve neutral position, remains in significant flexion  Flexion - lacking at least 25%  Rotation - lacking at least 40% bilat  and pain-limited    LOWER EXTREMITY MMT:    MMT Right eval Left eval  Hip flexion 4- 3*  Hip extension    Hip abduction 4+ 4+  Hip adduction 4+ 4+  Hip internal  rotation    Hip external rotation    Knee flexion 4 3*  Knee extension 4+ 4+  Ankle dorsiflexion 4 4+  Ankle plantarflexion    Ankle inversion    Ankle eversion     (Blank rows = not tested) *=pain limited   LUMBAR SPECIAL TESTS:  deferred  FUNCTIONAL TESTS:  10 meter walk test: 0.63 m/s crouched posture, decreased hip extension, decreased step-length and heel strike, elevated R>L shoulder, heavy BUE weightbearing on 4WW  GAIT: Distance walked: 10MWT/clinic distances (see above) Assistive device utilized: Environmental consultant - 4 wheeled Level of assistance: Modified independence Comments: gait mechanics impaired: decreased gait speed, crouched posture, decreased hip extension, decreased step-length and heel strike, elevated R>L shoulder, heavy BUE weightbearing on 4WW  10 Meter Walk Test: Patient instructed to walk 10 meters (32.8 ft) as quickly and as safely as possible at their normal speed x2 and at a fast speed x2. Time measured from 2 meter mark to 8 meter mark to accommodate ramp-up and ramp-down.  Normal speed 1: 0.66 m/s with rollator Normal speed 2: 0.66 m/s with rollator Normal speed 3: 0.68 m/s with upright 4WW Normal speed 4: 0.68 m/s with upright 4WW Average Normal speed: 0.67 m/s  Cut off scores: <0.4 m/s = household Ambulator, 0.4-0.8 m/s = limited community Ambulator, >0.8 m/s = community Ambulator, >1.2 m/s = crossing a street, <1.0 = increased fall risk MCID 0.05 m/s (small), 0.13 m/s (moderate), 0.06 m/s (significant)  (ANPTA Core Set of Outcome Measures for Adults with Neurologic Conditions, 2018)   MODIFIED OSWESTRY DISABILITY SCALE  Date: 12/11/2023 Score  Pain intensity 3 =  Pain medication provides me with moderate relief from pain.  2. Personal care (washing, dressing, etc.) 1 =  I can take care of myself normally, but it increases my pain.  3. Lifting 1 = I can lift heavy weights, but it causes increased pain.  4. Walking 4 = I can only walk with crutches or a  cane.  5. Sitting 1 =  I can only sit in my favorite chair as long as I like.  6. Standing 3 =  Pain prevents me from standing more than 1/2 hour.  7. Sleeping 1 = I can sleep well only by using pain medication.  8. Social Life 2 = Pain prevents me from participating in more energetic activities (eg. sports, dancing).  9. Traveling 1 =  I can travel anywhere, but it increases my pain.  10. Employment/ Homemaking 2 = I can perform most of my homemaking/job duties, but pain prevents me from performing more physically stressful activities (eg, lifting, vacuuming).  Total 19/50 = 38%   Interpretation of scores: Score Category Description  0-20% Minimal Disability The patient can cope with most living activities. Usually no  treatment is indicated apart from advice on lifting, sitting and exercise  21-40% Moderate Disability The patient experiences more pain and difficulty with sitting, lifting and standing. Travel and social life are more difficult and they may be disabled from work. Personal care, sexual activity and sleeping are not grossly affected, and the patient can usually be managed by conservative means  41-60% Severe Disability Pain remains the main problem in this group, but activities of daily living are affected. These patients require a detailed investigation  61-80% Crippled Back pain impinges on all aspects of the patient's life. Positive intervention is required  81-100% Bed-bound  These patients are either bed-bound or exaggerating their symptoms  Bluford FORBES Zoe DELENA Karon DELENA, et al. Surgery versus conservative management of stable thoracolumbar fracture: the PRESTO feasibility RCT. [place] ([place]): [employer]; 2021 Nov. [facility] Technology Assessment, No. 25.62.) Appendix 3, Oswestry Disability Index category descriptors. Available from: FindJewel[employer].cz  Minimally Clinically Important Difference (MCID) = 12.8%   Pt performed 5 time  sit<>stand (5xSTS): 21.78 sec without UE support sec (>15 sec indicates increased fall risk)      TREATMENT DATE: 12/18/23    Wellzone introduction to gym equipment:  Knee ext L1- 3 x 10 reps  Knee flex L1- 1 x 10 reps; 2 x 10 reps - at L2 Attempted leg press yet patient unable to pull legs up onto leg plate Seated rows (wide grip) 2 x 10 L2- Increased time to negotiate equipment with patient having most difficulty separating LE to straddle seat- eventually able to swing leg over.  Seated rows (neutral grip) 2 x 10 L2 Attempted standing rows- patient using rope attachment x 5 at L2 but stated did not feel comfortable.  Octane- Detailed instruction on how to use properly/safely- x 6 min        PATIENT EDUCATION:  Education details: Provided education on findings of exam, indications for plan, prognoses, goals, how PT can help                 Person educated: Patient Education method: Explanation Education comprehension: verbalized understanding  HOME EXERCISE PROGRAM: Access Code: [email or web address] URL: [email or web address] Date: 10/05/2023 Prepared by: Lonni Gainer  Exercises - Supine Bridge  - 3 x weekly - 3 sets - 10 reps - Clamshell  - 3 x weekly - 3 sets - 10 reps - Supine Lower Trunk Rotation  - 1 x daily - 3 sets - 30 sec hold - Seated Hamstring Stretch  - 1 x daily - 3 sets - 30 hold - Seated Clamshell  - 1 x daily - 7 x weekly - 2 sets - 20 reps - 3 sec hold - Supine Transversus Abdominis Bracing - Hands on Stomach  - 3 x weekly - 3 sets - 10 reps - Supine Gluteal Sets  - 3 x weekly - 3 sets - 10 reps  ASSESSMENT:  CLINICAL IMPRESSION:  Treatment continued per plan of care. Focused today on educating in gym exercises and safety as long term plan is to discharge soon from PT and transition patient to gym setting. She struggled some with safety with equipment due to her low back pain and immobility. She will continue to benefit from training to determine  what she can do safely.  Pt will continue to benefit from skilled therapy to address remaining deficits in order to improve overall QoL and return to PLOF.      OBJECTIVE IMPAIRMENTS: Abnormal gait, decreased activity tolerance, decreased balance, decreased mobility, difficulty  walking, decreased strength, hypomobility, increased edema, improper body mechanics, postural dysfunction, and pain.   ACTIVITY LIMITATIONS: carrying, lifting, bending, squatting, stairs, transfers, bed mobility, and locomotion level  PARTICIPATION LIMITATIONS: meal prep, cleaning, shopping, community activity, and yard work  PERSONAL FACTORS: Age, Fitness, Sex, Time since onset of injury/illness/exacerbation, and 3+ comorbidities: PMH per chart includes arthritis low back and left shoulder, bilateral carpal tunnel, asthma, glaucoma, HTN, leaky heart valve, sleep apnea, pre-diabetes, being seen by lymphedema specialist for BLE lymphedema  are also affecting patient's functional outcome.   REHAB POTENTIAL: Good  CLINICAL DECISION MAKING: Evolving/moderate complexity  EVALUATION COMPLEXITY: Moderate   GOALS: Goals reviewed with patient? Yes   SHORT TERM GOALS: Target date: 10/31/2023    Patient will be independent in home exercise program to improve strength/mobility for better functional independence with ADLs. Baseline:to be initiated; 11/06/2023=Patient reports knowledgeable of HEP but not always able to perform and sometimes painful so has to modify.  Goal status: MET   LONG TERM GOALS: Target date: 03/04/2024    Patient will reduce modified Oswestry score to <20 as to demonstrate minimal disability with ADLs including improved sleeping tolerance, walking/sitting tolerance etc for better mobility with ADLs.  Baseline: 34; 11/06/2023= 32; 12/11/2023= 38% Goal status: ONGOING  2.  Patient (> 38 years old) will complete five times sit to stand test in < 15 seconds indicating an increased LE strength and  improved balance. Baseline: 09/26/2023= 26.46 sec with heavy UE Support;  11/06/2023= 22.31 sec without UE Support; 12/11/2023 =21.78 sec without UE support Goal status: PROGRESSING  3.  Patient will increase Berg Balance score by > 6 points to demonstrate decreased fall risk during functional activities Baseline: 09/26/2023= 31/56; 11/06/2023= 40/56 Goal status: MET  4.  Patient will increase 10 meter walk test to >1.76m/s as to improve gait speed for better community ambulation and to reduce fall risk. Baseline:  0.63 m/s with 4WW; 0.74 m/s; 12/11/2203= 0.66 m/s with 4WW and 0.68 m/s with upright 4WW Goal status: PROGRESSING  5. Patient will transition from skilled PT services to independent with gym based program for long term plan for fitness and management of LE weakness/low back pain.   Baseline: Patient current engaged with outpatient PT services to assist with strength and low back pain management.  Goal status: NEW      PLAN:  PT FREQUENCY: 1-2x/week  PT DURATION: 12 weeks  PLANNED INTERVENTIONS: [id number]- PT Re-evaluation, 97750- Physical Performance Testing, 97110-Therapeutic exercises, 97530- Therapeutic activity, [id number]- Neuromuscular re-education, 97535- Self Care, [id number]- Manual therapy, [id number]- Gait training, [phone]- Orthotic Initial, [phone]- Orthotic/Prosthetic subsequent, [phone]- Canalith repositioning, Patient/Family education, Balance training, Stair training, Taping, Joint mobilization, Spinal mobilization, Vestibular training, DME instructions, Cryotherapy, and Moist heat.  PLAN FOR NEXT SESSION:   Well zone for LE strengthening and mobility training. Continue to progress functional mobility in standing and seated if needed  Continue with some static and dynamic balance activities as appropriate. Manual therapy for lumbar ROM/pain Continue to progress HEP to activities that promote mobility with the least amount of pain as possible.   Chyrl London, PT Physical Therapist  - [facility]  12/19/23, 10:52 AM

## 2023-12-19 ENCOUNTER — Encounter: Payer: Self-pay | Admitting: Occupational Therapy

## 2023-12-19 NOTE — Therapy (Signed)
 OUTPATIENT OCCUPATIONAL THERAPY TREATMENT NOTE AND PROGRESS REPORT   BILATERAL LOWER EXTREMITY LYMPHEDEMA  Patient Name: Judith Arnold MRN: [id number] DOB:[birth year], 73 y.o., female Today's Date: 12/19/2023  REPORTING PERIOD: 11/20/23 - 12/18/23  END OF SESSION:   OT End of Session - 12/18/23 1433     Visit Number 40    Number of Visits 72    Date for Recertification  01/16/24    OT Start Time 0225    OT Stop Time 0308    OT Time Calculation (min) 43 min    Activity Tolerance Patient tolerated treatment well;No increased pain    Behavior During Therapy WFL for tasks assessed/performed           Past Medical History:  Diagnosis Date   Allergy    Arthritis    lower back, left shoulder   Asthma    Carpal tunnel syndrome on both sides    Dental bridge present    top   Dental crown present    multiple   Family history of adverse reaction to anesthesia    Father and sister - PONV   GERD (gastroesophageal reflux disease)    Glaucoma    Hypertension    Leaky heart valve    Pre-diabetes    Sleep apnea    Past Surgical History:  Procedure Laterality Date   CATARACT EXTRACTION W/PHACO Right 11/16/2016   Procedure: CATARACT EXTRACTION PHACO AND INTRAOCULAR LENS PLACEMENT (IOC)  right;  Surgeon: Mittie Gaskin, MD;  Location: [facility];  Service: Ophthalmology;  Laterality: Right;  pre diabetic latex sensitivity sleep apnea   CATARACT EXTRACTION W/PHACO Left 01/18/2017   Procedure: CATARACT EXTRACTION PHACO AND INTRAOCULAR LENS PLACEMENT (IOC) LEFT DIABETIC;  Surgeon: Mittie Gaskin, MD;  Location: [facility];  Service: Ophthalmology;  Laterality: Left;   COLONOSCOPY     COLONOSCOPY WITH PROPOFOL  N/A 04/09/2018   Procedure: COLONOSCOPY WITH PROPOFOL ;  Surgeon: Viktoria Lamar DASEN, MD;  Location: [facility];  Service: Endoscopy;  Laterality: N/A;   IRIDOTOMY / IRIDECTOMY Bilateral 2005   TOOTH EXTRACTION     Patient Active Problem List    Diagnosis Date Noted   Chronic bilateral low back pain with right-sided sciatica 09/29/2023   Hypertension associated with diabetes (HCC) 01/20/2023   Combined hyperlipidemia associated with type 2 diabetes mellitus (HCC) 01/20/2023   Intertrigo 11/01/2022   Stasis dermatitis of both legs 10/21/2022   Leg swelling 10/21/2022   Lymphedema of lower extremity 10/21/2022   Type 2 diabetes mellitus without complication, without long-term current use of insulin (HCC) 05/24/2022   Mixed hyperlipidemia 05/24/2022   Essential hypertension, benign 05/24/2022   Absolute anemia 05/24/2022   Gastroesophageal reflux disease without esophagitis 05/24/2022   Allergic rhinitis 05/24/2022   Asthma, allergic, moderate persistent, uncomplicated 05/24/2022   Bilateral chronic angle-closure glaucoma, indeterminate stage 05/24/2022   Airway hyperreactivity 08/29/2013   OSA on CPAP 08/29/2013    PCP: Fredy GORMAN Bathe, MD  REFERRING PROVIDER: same  REFERRING DIAG: lymphedema I89.0  THERAPY DIAG:  Lymphedema, not elsewhere classified  Rationale for Evaluation and Treatment: Rehabilitation  ONSET DATE: Pt reports onset of L ankle / leg swelling after injury 20-30 yrs ago. Then about 1 year ago legs started swelling and Pt having prickly sensation on shins and thighs bilaterally.   SUBJECTIVE:  SUBJECTIVE STATEMENT: Judith Arnold presents to OT for treatment of BLE lymphedema 2/2 suspected CVI and obesity. Pt is unaccompanied today. Pt denies lymphedema related pain in her legs. Pt brings both Circaid compression stocking alternatives to clinic today. She reports that the woman she has been paying to assist her with compression continues to become more unreliable. Pt had PT session before OT today . Pt REPORTS GENERALIZED PAIN,  BUT DOES NOT RATE IT NUMERICALLY.  (INITIAL EVAL 07/12/23 : Lyn Joens is referred to Occupational Therapy by Fredy GORMAN Bathe, MD,  for evaluation and treatment of BLE lymphedema.  Pt reports onset of L ankle and distal leg swelling about 30 years ago, then noticed RLE swelling and worsening LLE swelling about 1 year ago at same time she started having unusual sensation in legs. Pt denies previously having lymphedema treatment. Pt reports paternal aunt had leg swelling. Pt is unable to wear compression stockings because she is unable to reach feet and distal legs to don and doff them due to back and hip pain.  PERTINENT HISTORY: Relevant to lymphedema (OSA (denies using CPAP), BLE stasis dermatitis, HTN, DM 2 (Pt states she is prediabetic only), B glaucoma, Back and R hip OA  PAIN:  Are you having LE-related pain? Yes: NPRS scale: not rated/10 Pain location: medial proximal thighs w abduction, bilateral groin, buttocks, back, legs Pain description: like stickers, tired, sore Aggravating factors: standing, walking, dependent sitting Relieving factors: elevation  PRECAUTIONS: Fall and Other: LYMPHEDEMA precautions  (DM 2 and asthma)  WEIGHT BEARING RESTRICTIONS: No  FALLS:  Has patient fallen in last 6 months? yes Fell asleep at the computer and fell onto the floor [place] when storm door hit me  LIVING ENVIRONMENT: Lives with: alone Lives in: House/apartment Stairs: No;  Has following equipment at home: Environmental consultant - 4 wheeled  OCCUPATION: retired Scientist, research (life sciences)  LEISURE: reading and taking notes on current events and politics  HAND DOMINANCE: right   PRIOR LEVEL OF FUNCTION: Independent with household mobility with device, Requires assistive device for independence, Needs assistance with ADLs, Needs assistance with homemaking, and Needs assistance with gait  PATIENT GOALS: reduce swelling and keep it from getting worse  OBJECTIVE: Note: Objective measures were  completed at Evaluation unless otherwise noted.  COGNITION:  Overall cognitive status: impaired short term  memory   OBSERVATIONS / OTHER ASSESSMENTS: Mild, Stage II, BLE Lymphedema 2/2 suspected venous insufficiency and hx of obesity   SENSATION: Reports uncomfortable scratching, or sticker-like  sensation on anterior thighs   POSTURE: Raised walker handles 2 notches to increase upright spinal alignment when walking to limit falls risk. Handles need to be raised at least 1 hole more for safe upright posture when walking.  Upright sitting posture: head forward , shoulders forward and rounded- appears to be flexible kyphosis  LE ROM: WFL for ankles and knees. Pt reports limited hip abduction  LE MMT: [facility] FOR LYMPHEDEMA CARE. Presenting with generalized weakness and debility  LYMPHEDEMA ASSESSMENTS:   SURGERY TYPE/DATE: Non-cancer related limb swelling  HX INFECTIONS: positive for 1 episode cellulitis treated w antibiotics  Hx WOUNDS: denies   BLE COMPARATIVE LIMB VOLUMETRICS: INITIAL 07/19/23  LANDMARK RIGHT (dominant)  R LEG (A-D) 3525.1 ml  R THIGH (E-G) ml  R FULL LIMB (A-G) ml  Limb Volume differential (LVD)  %  Volume change since initial %  Volume change overall V  (Blank rows = not tested)  LANDMARK LEFT    L LEG (A-D) 3769.6 ml  L THIGH (E-G) ml  L FULL LIMB (A-G) ml  Limb Volume differential (LVD)  Limb Volume Differential (LVD) measures 6.5%, L>R.  Volume change since initial %  Volume change overall %    RLE COMPARATIVE LIMB VOLUMETRICS: VISIT 9 08/15/23  LANDMARK RIGHT (dominant)  R LEG (A-D) 3549.4 ml  R THIGH (E-G) ml  R FULL LIMB (A-G) ml  Limb Volume differential (LVD)  %  Volume change since initial R LEG volume reduced by 5.84% since initially measured on 07/18/21  Volume change overall V  (Blank rows = not tested)  RLE COMPARATIVE LIMB VOLUMETRICS: VISIT 19 10/03/23  LANDMARK RIGHT (dominant)  R LEG (A-D) 3549.4 ml  R THIGH (E-G) ml  R FULL  LIMB (A-G) ml  Limb Volume differential (LVD)  %  Volume change since initial R LEG volume reduced by 14.7% since initially measured on 07/18/21  Volume change overall V    BLE COMPARATIVE LIMB VOLUMETRICS: 29th Visit  LANDMARK RIGHT (dominant)  R LEG (A-D) 3438.1 ml  R THIGH (E-G) ml  R FULL LIMB (A-G) ml  Limb Volume differential (LVD)  %  Volume change since last measured on 10/03/23 (19th visit) DECREASED by 0.67%. R Leg limb volume stable.  Volume change overall Overall R Leg volume reduction = 2.5%. Goal not me to date  (Blank rows = not tested)  LANDMARK LEFT    L LEG (A-D) 4174.4  ml  L THIGH (E-G) ml  L FULL LIMB (A-G) ml  Limb Volume differential (LVD)  .  Volume change since initial on 07/19/23 L LEG volume INCREASED by 10.7% since initially measured on 07/19/23  Volume change overall L LEG volume INCREASED by 10.7& since initially measured on 07/19/23  (Blank rows = not tested)  Mild, Stage  II, Bilateral Lower Extremity Lymphedema 2/2 CVI and Obesity  Skin  Description Hyper-Keratosis Peau' de Orange Shiny Tight Fibrotic/ Indurated Fatty Doughy Spongy/ boggy       R>L x  x   Skin dry Flaky WNL Macerated   mildly      Color Redness Varicosities Blanching Hemosiderin Stain Mottled   x     x   Odor Malodorous Yeast Fungal infection  WNL      x   Temperature Warm Cool wnl    x     Pitting Edema   1+ 2+ 3+ 4+ Non-pitting         x   Girth Symmetrical Asymmetrical                   Distribution    R>L toes to groin    Stemmer Sign Positive Negative   +    Lymphorrhea History Of:  Present Absent     x    Wounds History Of Present Absent Venous Arterial Pressure Sheer     x        Signs of Infection Redness Warmth Erythema Acute Swelling Drainage Borders                    Sensation Light Touch Deep pressure Hypersensitivity   Present Impaired Present Impaired Absent Impaired   x Tactile  x  x     Nails WNL   Fungus nail dystrophy   x      Hair Growth Symmetrical Asymmetrical   x    Skin Creases Base of toes  Ankles   Base of Fingers knees       Abdominal pannus Thigh  Lobules  Face/neck   x x  x      (Blank rows = not tested)    LYMPHEDEMA LIFE IMPACT SCALE (LLIS): TBA   TREATMENT THIS DATE:  Pt edu for lymphedema progress towards goals and self-care  LLE/LLQ MLD BLE knee length Circaid leggings with 30-40 mmHg-Mod A to don 2/2 time constraints  PATIENT EDUCATION:  Continued Pt/ CG edu for lymphedema self care home program throughout session. Topics include outcome of comparative limb volumetrics- starting limb volume differentials (LVDs), technology and gradient techniques used for short stretch, multilayer compression wrapping, simple self-MLD, therapeutic lymphatic pumping exercises, skin/nail care, LE precautions, compression garment recommendations and specifications, wear and care schedule and compression garment donning / doffing w assistive devices. Discussed progress towards all OT goals since commencing CDT. Discussed detrimental impact of obesity on lower and upper extremity lymphedema over time. Reviewed OT goals for lymphedema care with Pt and discussed progress to date.  All questions answered to the Pt's satisfaction. Good return. Person educated: Patient  Education method: Explanation, Demonstration, and Handouts Education comprehension: verbalized understanding, returned demonstration, verbal cues required, and needs further education   HOME EXERCISE PROGRAM: BLE lymphatic pumping there ex using- 1 sets of 10 reps, each exercise in order-  1-2 x daily, bilaterally Simple self MLD 1 x daily Daily skin care to increase hydration, skin mobility and decrease infection risk- can be done during MLD During Intensive Phase CDT: Compression wraps 23/7 until limb volume reduction complete During Self-management Phase CDT: Fit with appropriate compression garments or alternatives. Consider BLE, knee length,  Mediven, custom, CircAid , Velcro style leggings over soft cotton liners.  ASSESSMENT: CLINICAL IMPRESSION: Pt continues to manage lymphedema well between sessions with paid caregiver helping her to don and doff daily compression. Despite care provider being somewhat inconsistent and unreliable, Pt remains strict about elevation, skin care and self MLD. At this point she is in compression more days than not.Limb volume reductions goal met bilaterally with R LEG volume reduced by 14.7%  and L LEG reduced 10.7% since initially measured on 07/18/21. Please review goals section above for additional detailed progress towards goals. Reduce OT frequency to 1 x weekly for next month to assist Pt with transition to Self-management Phae of CDT. Cont as per POC.  (INITIAL EVAL 07/12/23: Kynadie Graylin Pizza is a 73 yo female presenting with chronic, progressive, BLE swelling 2/2 unknown etiology, which at first glance appears to be circulatory in nature due to skin coloring at distal legs where swelling is most invested. Pt is presently unable to wear traditional elastic compression garments due to difficulty donning and doffing them.  Chronic, progressive lymphedema with associated skin changes, including fibrosis, limits this patent's functional performance in all occupational domains, including functional ambulation and mobility, basic and instrumental ADLs (lower body dressing, LB bathing, fitting street shoes and LB clothing, driving, shopping, and home management. It also limits perform productive activities, leisure pursuits, and participation in social and community activities. BLE lymphedema contributes to elevated infection risk. Pt will benefit from skilled OT for Complete Decongestive Therapy (CDT), which  typically includes manual lymphatic drainage (MLD), skin care to limit' infection risk and increase skin excursion, lymphatic pumping exercise, and during the Intensive Phase multilayer, gradient compression  bandaging to reduce limb volume. Once limb volume is reduced as much as possible, Pt is fitted with appropriate compression garments and/ or devices and transitions into the self management phase of care consisting of follow long support PRN.  Pt understands that her fair prognosis will become poor without daily assistance with compression wrapping since she is unable to reach feet and legs to apply them herself. Pt assures OT that she has a friend she believes is willing to assist her between OT sessions. )   OBJECTIVE IMPAIRMENTS: decreased standing and walking tolerance, activity tolerance, decreased balance, decreased knowledge of condition, decreased knowledge of use of DME, decreased mobility, decreased ROM, decreased strength, increased edema, impaired flexibility, impaired sensation, impaired UE functional use, impaired vision/reception, pain, and chronic, progressive, BLE swelling and associated skin changes, at increased infection risk   ACTIVITY LIMITATIONS: Functional ambulation and mobility (lifting, carrying, steps/stairs, transfers, squatting) , Basic and instrumental ADLs, leisure pursuits, productive activities, social participation  PARTICIPATION LIMITATIONS: meal prep, cleaning, laundry, driving, shopping, yard work, and    PERSONAL FACTORS: Age, Fitness, Past/current experiences, Time since onset of injury/illness/exacerbation, 2+ co morbidities: OSA, HTN,  are also affecting patient's functional outcome.   REHAB POTENTIAL: Good  EVALUATION COMPLEXITY: Moderate   GOALS: Goals reviewed with patient? Yes  SHORT TERM GOALS: Target date: 4th OT Rx visit   Pt will demonstrate understanding of lymphedema precautions and prevention strategies with modified independence using a printed reference to identify at least 5 precautions and discussing how s/he may implement them into daily life to reduce risk of progression with extra time. Baseline:Max A Goal status: GOAL MET  2.   Pt will be able to apply multilayer, knee length, gradient, compression wraps to one leg at a time from toes to below knee with max caregiver assist to decrease limb volume, to limit infection risk, and to limit lymphedema progression.  Baseline: Dependent Goal status: GOAL MET  LONG TERM GOALS: Target date: 09/19/23  1.Given this patient's Intake score of TBA % on the Lymphedema Life Impact Scale (LLIS), patient will experience a reduction of at least 5 points in her perceived level of functional impairment resulting from lymphedema to improve functional performance and quality of life (QOL). Baseline: TBA % Goal status:  GOAL DEFERRED  2.  Pt will achieve at least a 10% volume reduction in B legs to return limb to typical size and shape, to limit infection risk and LE progression, to decrease pain, to improve function. Baseline: Dependent Goal status:GOAL MET R LEG volume reduced by 14.7%, and L LEG reduced by 10.7% since initially measured on 07/18/21  3.  Pt will obtain appropriate compression garments/devices and achieve modified independence (extra time + assistive devices) with donning/doffing to optimize limb volume reductions and limit LE progression over time. Baseline: Dependent Goal status: GOAL PARTIALLY MET. Pt obtained BLE, Velcro wrap style, knee length, CircAid elastic  garment alternatives , which are replacing multilayer compression wraps. Pt requires moderate assistance to don and gauge these garment alternatives at most distal straps where she cannot reach them. Pt plans to take garments to an alterations shop o have webbing loops added to each strap so she can reach them easier.She is independent with doffing them.   4.During Intensive phase CDT, with max CG assistance, Pt will achieve at least 85% compliance with all adapted lymphedema self-care home program components, including daily skin care, compression wraps and /or garments, simple self MLD and lymphatic pumping therex to  habituate LE self care protocol  into ADLs for optimal LE self-management over time. Baseline: Dependent Goal status: GOAL MET  PLAN:  OT FREQUENCY: reduce OT frequency 1 x weekly for 4 weeks to support transition to LE self-care  OT  DURATION: 12 weeks and PRN  PLANNED INTERVENTIONS:compression bandaging, skin care,  97110-Therapeutic exercises, 97530- Therapeutic activity, 97535- Self Care, Manual lymph drainage, DME instructions, and fit with appropriate compression  PLAN FOR NEXT SESSION:  Manual therapy to LLE as established including MLD, skin care and compression Fit CircAid to LLE. Cont Pt edu for LE self-care   Zebedee Dec, MS, OTR/L, CLT-LANA 12/19/23 12:53 PM

## 2023-12-20 ENCOUNTER — Ambulatory Visit: Admitting: Occupational Therapy

## 2023-12-20 ENCOUNTER — Ambulatory Visit

## 2023-12-20 ENCOUNTER — Telehealth: Payer: Self-pay | Admitting: Internal Medicine

## 2023-12-20 DIAGNOSIS — M5459 Other low back pain: Secondary | ICD-10-CM | POA: Diagnosis not present

## 2023-12-20 DIAGNOSIS — M6281 Muscle weakness (generalized): Secondary | ICD-10-CM

## 2023-12-20 DIAGNOSIS — R278 Other lack of coordination: Secondary | ICD-10-CM

## 2023-12-20 DIAGNOSIS — M25551 Pain in right hip: Secondary | ICD-10-CM | POA: Diagnosis not present

## 2023-12-20 DIAGNOSIS — I890 Lymphedema, not elsewhere classified: Secondary | ICD-10-CM

## 2023-12-20 DIAGNOSIS — R262 Difficulty in walking, not elsewhere classified: Secondary | ICD-10-CM | POA: Diagnosis not present

## 2023-12-20 NOTE — Therapy (Signed)
 OUTPATIENT OCCUPATIONAL THERAPY TREATMENT NOTE  BILATERAL LOWER EXTREMITY LYMPHEDEMA  Patient Name: Judith Arnold MRN: [id number] DOB:27-Oct-1950, 73 y.o., female Today's Date: 12/20/2023  REPORTING PERIOD:   END OF SESSION:   OT End of Session - 12/20/23 1417     Visit Number 41    Number of Visits 72    Date for Recertification  01/16/24    OT Start Time 0208    OT Stop Time 0315    OT Time Calculation (min) 67 min    Activity Tolerance Patient tolerated treatment well;No increased pain    Behavior During Therapy WFL for tasks assessed/performed           Past Medical History:  Diagnosis Date   Allergy    Arthritis    lower back, left shoulder   Asthma    Carpal tunnel syndrome on both sides    Dental bridge present    top   Dental crown present    multiple   Family history of adverse reaction to anesthesia    Father and sister - PONV   GERD (gastroesophageal reflux disease)    Glaucoma    Hypertension    Leaky heart valve    Pre-diabetes    Sleep apnea    Past Surgical History:  Procedure Laterality Date   CATARACT EXTRACTION W/PHACO Right 11/16/2016   Procedure: CATARACT EXTRACTION PHACO AND INTRAOCULAR LENS PLACEMENT (IOC)  right;  Surgeon: Mittie Gaskin, MD;  Location: [facility];  Service: Ophthalmology;  Laterality: Right;  pre diabetic latex sensitivity sleep apnea   CATARACT EXTRACTION W/PHACO Left 01/18/2017   Procedure: CATARACT EXTRACTION PHACO AND INTRAOCULAR LENS PLACEMENT (IOC) LEFT DIABETIC;  Surgeon: Mittie Gaskin, MD;  Location: [facility];  Service: Ophthalmology;  Laterality: Left;   COLONOSCOPY     COLONOSCOPY WITH PROPOFOL  N/A 04/09/2018   Procedure: COLONOSCOPY WITH PROPOFOL ;  Surgeon: Viktoria Lamar DASEN, MD;  Location: [facility];  Service: Endoscopy;  Laterality: N/A;   IRIDOTOMY / IRIDECTOMY Bilateral 2005   TOOTH EXTRACTION     Patient Active Problem List   Diagnosis Date Noted   Chronic  bilateral low back pain with right-sided sciatica 09/29/2023   Hypertension associated with diabetes (HCC) 01/20/2023   Combined hyperlipidemia associated with type 2 diabetes mellitus (HCC) 01/20/2023   Intertrigo 11/01/2022   Stasis dermatitis of both legs 10/21/2022   Leg swelling 10/21/2022   Lymphedema of lower extremity 10/21/2022   Type 2 diabetes mellitus without complication, without long-term current use of insulin (HCC) 05/24/2022   Mixed hyperlipidemia 05/24/2022   Essential hypertension, benign 05/24/2022   Absolute anemia 05/24/2022   Gastroesophageal reflux disease without esophagitis 05/24/2022   Allergic rhinitis 05/24/2022   Asthma, allergic, moderate persistent, uncomplicated 05/24/2022   Bilateral chronic angle-closure glaucoma, indeterminate stage 05/24/2022   Airway hyperreactivity 08/29/2013   OSA on CPAP 08/29/2013    PCP: Fredy GORMAN Bathe, MD  REFERRING PROVIDER: same  REFERRING DIAG: lymphedema I89.0  THERAPY DIAG:  Lymphedema, not elsewhere classified  Rationale for Evaluation and Treatment: Rehabilitation  ONSET DATE: Pt reports onset of L ankle / leg swelling after injury 20-30 yrs ago. Then about 1 year ago legs started swelling and Pt having prickly sensation on shins and thighs bilaterally.   SUBJECTIVE:  SUBJECTIVE STATEMENT: Judith Arnold presents to OT for treatment of BLE lymphedema 2/2 suspected CVI and obesity. Pt is unaccompanied today. Pt denies lymphedema related pain in her legs. Pt brings both Circaid compression stocking alternatives to clinic today.   (INITIAL EVAL 07/12/23 : Judith Arnold is referred to Occupational Therapy by Fredy GORMAN Bathe, MD,  for evaluation and treatment of BLE lymphedema.  Pt reports onset of L ankle and distal leg swelling  about 30 years ago, then noticed RLE swelling and worsening LLE swelling about 1 year ago at same time she started having unusual sensation in legs. Pt denies previously having lymphedema treatment. Pt reports paternal aunt had leg swelling. Pt is unable to wear compression stockings because she is unable to reach feet and distal legs to don and doff them due to back and hip pain.  PERTINENT HISTORY: Relevant to lymphedema (OSA (denies using CPAP), BLE stasis dermatitis, HTN, DM 2 (Pt states she is prediabetic only), B glaucoma, Back and R hip OA  PAIN:  Are you having LE-related pain? Yes: NPRS scale: not rated/10 Pain location: medial proximal thighs w abduction, bilateral groin, buttocks, back, legs Pain description: like stickers, tired, sore Aggravating factors: standing, walking, dependent sitting Relieving factors: elevation  PRECAUTIONS: Fall and Other: LYMPHEDEMA precautions  (DM 2 and asthma)  WEIGHT BEARING RESTRICTIONS: No  FALLS:  Has patient fallen in last 6 months? yes Fell asleep at the computer and fell onto the floor [place] when storm door hit me  LIVING ENVIRONMENT: Lives with: alone Lives in: House/apartment Stairs: No;  Has following equipment at home: Environmental consultant - 4 wheeled  OCCUPATION: retired Scientist, research (life sciences)  LEISURE: reading and taking notes on current events and politics  HAND DOMINANCE: right   PRIOR LEVEL OF FUNCTION: Independent with household mobility with device, Requires assistive device for independence, Needs assistance with ADLs, Needs assistance with homemaking, and Needs assistance with gait  PATIENT GOALS: reduce swelling and keep it from getting worse  OBJECTIVE: Note: Objective measures were completed at Evaluation unless otherwise noted.  COGNITION:  Overall cognitive status: impaired short term  memory   OBSERVATIONS / OTHER ASSESSMENTS: Mild, Stage II, BLE Lymphedema 2/2 suspected venous insufficiency and hx of obesity    SENSATION: Reports uncomfortable scratching, or sticker-like  sensation on anterior thighs   POSTURE: Raised walker handles 2 notches to increase upright spinal alignment when walking to limit falls risk. Handles need to be raised at least 1 hole more for safe upright posture when walking.  Upright sitting posture: head forward , shoulders forward and rounded- appears to be flexible kyphosis  LE ROM: WFL for ankles and knees. Pt reports limited hip abduction  LE MMT: [facility]. Presenting with generalized weakness and debility  LYMPHEDEMA ASSESSMENTS:   SURGERY TYPE/DATE: Non-cancer related limb swelling  HX INFECTIONS: positive for 1 episode cellulitis treated w antibiotics  Hx WOUNDS: denies   BLE COMPARATIVE LIMB VOLUMETRICS: INITIAL 07/19/23  LANDMARK RIGHT (dominant)  R LEG (A-D) 3525.1 ml  R THIGH (E-G) ml  R FULL LIMB (A-G) ml  Limb Volume differential (LVD)  %  Volume change since initial %  Volume change overall V  (Blank rows = not tested)  LANDMARK LEFT    L LEG (A-D) 3769.6 ml  L THIGH (E-G) ml  L FULL LIMB (A-G) ml  Limb Volume differential (LVD)  Limb Volume Differential (LVD) measures 6.5%, L>R.  Volume change since initial %  Volume change overall %  RLE COMPARATIVE LIMB VOLUMETRICS: VISIT 9 08/15/23  LANDMARK RIGHT (dominant)  R LEG (A-D) 3549.4 ml  R THIGH (E-G) ml  R FULL LIMB (A-G) ml  Limb Volume differential (LVD)  %  Volume change since initial R LEG volume reduced by 5.84% since initially measured on 07/18/21  Volume change overall V  (Blank rows = not tested)  RLE COMPARATIVE LIMB VOLUMETRICS: VISIT 19 10/03/23  LANDMARK RIGHT (dominant)  R LEG (A-D) 3549.4 ml  R THIGH (E-G) ml  R FULL LIMB (A-G) ml  Limb Volume differential (LVD)  %  Volume change since initial R LEG volume reduced by 14.7% since initially measured on 07/18/21  Volume change overall V    BLE COMPARATIVE LIMB VOLUMETRICS: 29th Visit  LANDMARK RIGHT  (dominant)  R LEG (A-D) 3438.1 ml  R THIGH (E-G) ml  R FULL LIMB (A-G) ml  Limb Volume differential (LVD)  %  Volume change since last measured on 10/03/23 (19th visit) DECREASED by 0.67%. R Leg limb volume stable.  Volume change overall Overall R Leg volume reduction = 2.5%. Goal not me to date  (Blank rows = not tested)  LANDMARK LEFT    L LEG (A-D) 4174.4  ml  L THIGH (E-G) ml  L FULL LIMB (A-G) ml  Limb Volume differential (LVD)  .  Volume change since initial on 07/19/23 L LEG volume DECREASED by 10.7% since initially measured on 07/19/23  Volume change overall L LEG volume DECREASED by 10.7& since initially measured on 07/19/23  (Blank rows = not tested)  Mild, Stage  II, Bilateral Lower Extremity Lymphedema 2/2 CVI and Obesity  Skin  Description Hyper-Keratosis Peau' de Orange Shiny Tight Fibrotic/ Indurated Fatty Doughy Spongy/ boggy       R>L x  x   Skin dry Flaky WNL Macerated   mildly      Color Redness Varicosities Blanching Hemosiderin Stain Mottled   x     x   Odor Malodorous Yeast Fungal infection  WNL      x   Temperature Warm Cool wnl    x     Pitting Edema   1+ 2+ 3+ 4+ Non-pitting         x   Girth Symmetrical Asymmetrical                   Distribution    R>L toes to groin    Stemmer Sign Positive Negative   +    Lymphorrhea History Of:  Present Absent     x    Wounds History Of Present Absent Venous Arterial Pressure Sheer     x        Signs of Infection Redness Warmth Erythema Acute Swelling Drainage Borders                    Sensation Light Touch Deep pressure Hypersensitivity   Present Impaired Present Impaired Absent Impaired   x Tactile  x  x     Nails WNL   Fungus nail dystrophy   x     Hair Growth Symmetrical Asymmetrical   x    Skin Creases Base of toes  Ankles   Base of Fingers knees       Abdominal pannus Thigh Lobules  Face/neck   x x  x      (Blank rows = not tested)    LYMPHEDEMA LIFE IMPACT SCALE  (LLIS): TBA   TREATMENT THIS DATE:  Pt edu for  lymphedema progress towards goals and self-care  LLE/LLQ MLD BLE knee length Circaid leggings with 30-40 mmHg-Mod A to don 2/2 time constraints  PATIENT EDUCATION:  Continued Pt/ CG edu for lymphedema self care home program throughout session. Topics include outcome of comparative limb volumetrics- starting limb volume differentials (LVDs), technology and gradient techniques used for short stretch, multilayer compression wrapping, simple self-MLD, therapeutic lymphatic pumping exercises, skin/nail care, LE precautions, compression garment recommendations and specifications, wear and care schedule and compression garment donning / doffing w assistive devices. Discussed progress towards all OT goals since commencing CDT. Discussed detrimental impact of obesity on lower and upper extremity lymphedema over time. Reviewed OT goals for lymphedema care with Pt and discussed progress to date.  All questions answered to the Pt's satisfaction. Good return. Person educated: Patient  Education method: Explanation, Demonstration, and Handouts Education comprehension: verbalized understanding, returned demonstration, verbal cues required, and needs further education   HOME EXERCISE PROGRAM: BLE lymphatic pumping there ex using- 1 sets of 10 reps, each exercise in order-  1-2 x daily, bilaterally Simple self MLD 1 x daily Daily skin care to increase hydration, skin mobility and decrease infection risk- can be done during MLD During Intensive Phase CDT: Compression wraps 23/7 until limb volume reduction complete During Self-management Phase CDT: Fit with appropriate compression garments or alternatives. Consider BLE, knee length, Mediven, custom, CircAid , Velcro style leggings over soft cotton liners.  ASSESSMENT: CLINICAL IMPRESSION:  Pt tolerated LLE/LLQ MLD well today without pain. Provided skin care to limit infection risk throughout MLD. Applied CircAid  compression garment alternatives as established to bilateral legs at end of session with min A. Pt continues to improve her ability to gauge these garments. Pt in agreement with reduction in OT frequency to 1 x weekly for the next 4 weeks as she transitions to self management of chronic, progressive lymphedema. Cont as per POC.  (INITIAL EVAL 07/12/23: Judith Arnold is a 73 yo female presenting with chronic, progressive, BLE swelling 2/2 unknown etiology, which at first glance appears to be circulatory in nature due to skin coloring at distal legs where swelling is most invested. Pt is presently unable to wear traditional elastic compression garments due to difficulty donning and doffing them.  Chronic, progressive lymphedema with associated skin changes, including fibrosis, limits this patent's functional performance in all occupational domains, including functional ambulation and mobility, basic and instrumental ADLs (lower body dressing, LB bathing, fitting street shoes and LB clothing, driving, shopping, and home management. It also limits perform productive activities, leisure pursuits, and participation in social and community activities. BLE lymphedema contributes to elevated infection risk. Pt will benefit from skilled OT for Complete Decongestive Therapy (CDT), which  typically includes manual lymphatic drainage (MLD), skin care to limit' infection risk and increase skin excursion, lymphatic pumping exercise, and during the Intensive Phase multilayer, gradient compression bandaging to reduce limb volume. Once limb volume is reduced as much as possible, Pt is fitted with appropriate compression garments and/ or devices and transitions into the self management phase of care consisting of follow long support PRN.    Pt understands that her fair prognosis will become poor without daily assistance with compression wrapping since she is unable to reach feet and legs to apply them herself. Pt assures  OT that she has a friend she believes is willing to assist her between OT sessions. )   OBJECTIVE IMPAIRMENTS: decreased standing and walking tolerance, activity tolerance, decreased balance, decreased knowledge of condition, decreased knowledge  of use of DME, decreased mobility, decreased ROM, decreased strength, increased edema, impaired flexibility, impaired sensation, impaired UE functional use, impaired vision/reception, pain, and chronic, progressive, BLE swelling and associated skin changes, at increased infection risk   ACTIVITY LIMITATIONS: Functional ambulation and mobility (lifting, carrying, steps/stairs, transfers, squatting) , Basic and instrumental ADLs, leisure pursuits, productive activities, social participation  PARTICIPATION LIMITATIONS: meal prep, cleaning, laundry, driving, shopping, yard work, and    PERSONAL FACTORS: Age, Fitness, Past/current experiences, Time since onset of injury/illness/exacerbation, 2+ co morbidities: OSA, HTN,  are also affecting patient's functional outcome.   REHAB POTENTIAL: Good  EVALUATION COMPLEXITY: Moderate   GOALS: Goals reviewed with patient? Yes  SHORT TERM GOALS: Target date: 4th OT Rx visit   Pt will demonstrate understanding of lymphedema precautions and prevention strategies with modified independence using a printed reference to identify at least 5 precautions and discussing how s/he may implement them into daily life to reduce risk of progression with extra time. Baseline:Max A Goal status: GOAL MET  2.  Pt will be able to apply multilayer, knee length, gradient, compression wraps to one leg at a time from toes to below knee with max caregiver assist to decrease limb volume, to limit infection risk, and to limit lymphedema progression.  Baseline: Dependent Goal status: GOAL MET  LONG TERM GOALS: Target date: 09/19/23  1.Given this patient's Intake score of TBA % on the Lymphedema Life Impact Scale (LLIS), patient will  experience a reduction of at least 5 points in her perceived level of functional impairment resulting from lymphedema to improve functional performance and quality of life (QOL). Baseline: TBA % Goal status:  GOAL DEFERRED  2.  Pt will achieve at least a 10% volume reduction in B legs to return limb to typical size and shape, to limit infection risk and LE progression, to decrease pain, to improve function. Baseline: Dependent Goal status:GOAL MET R LEG volume reduced by 14.7%, and L LEG reduced by 10.7% since initially measured on 07/18/21  3.  Pt will obtain appropriate compression garments/devices and achieve modified independence (extra time + assistive devices) with donning/doffing to optimize limb volume reductions and limit LE progression over time. Baseline: Dependent Goal status: GOAL PARTIALLY MET. Pt obtained BLE, Velcro wrap style, knee length, CircAid elastic  garment alternatives , which are replacing multilayer compression wraps. Pt requires moderate assistance to don and gauge these garment alternatives at most distal straps where she cannot reach them. Pt plans to take garments to an alterations shop o have webbing loops added to each strap so she can reach them easier.She is independent with doffing them.   4.During Intensive phase CDT, with max CG assistance, Pt will achieve at least 85% compliance with all adapted lymphedema self-care home program components, including daily skin care, compression wraps and /or garments, simple self MLD and lymphatic pumping therex to habituate LE self care protocol  into ADLs for optimal LE self-management over time. Baseline: Dependent Goal status: GOAL MET  PLAN:  OT FREQUENCY: reduce OT frequency 1 x weekly for 4 weeks to support transition to LE self-care  OT DURATION: 12 weeks and PRN  PLANNED INTERVENTIONS:compression bandaging, skin care,  97110-Therapeutic exercises, 97530- Therapeutic activity, 97535- Self Care, Manual lymph  drainage, DME instructions, and fit with appropriate compression  PLAN FOR NEXT SESSION:  Manual therapy to LLE as established including MLD, skin care and compression Fit CircAid to LLE. Cont Pt edu for LE self-care   Zebedee Dec, MS, OTR/L, CLT-LANA 12/20/23  3:25 PM

## 2023-12-20 NOTE — Therapy (Signed)
 OUTPATIENT PHYSICAL THERAPY THORACOLUMBAR TREATMENT   Patient Name: Judith Arnold MRN: [id number] DOB:Aug 04, 1950, 73 y.o., female Today's Date: 12/21/2023  END OF SESSION:  PT End of Session - 12/20/23 1320     Visit Number 23    Number of Visits 44    Date for Recertification  03/04/24    Progress Note Due on Visit 30    PT Start Time 1316    PT Stop Time 1359    PT Time Calculation (min) 43 min    Equipment Utilized During Treatment Gait belt    Activity Tolerance Patient tolerated treatment well;Patient limited by pain    Behavior During Therapy WFL for tasks assessed/performed            Past Medical History:  Diagnosis Date   Allergy    Arthritis    lower back, left shoulder   Asthma    Carpal tunnel syndrome on both sides    Dental bridge present    top   Dental crown present    multiple   Family history of adverse reaction to anesthesia    Father and sister - PONV   GERD (gastroesophageal reflux disease)    Glaucoma    Hypertension    Leaky heart valve    Pre-diabetes    Sleep apnea    Past Surgical History:  Procedure Laterality Date   CATARACT EXTRACTION W/PHACO Right 11/16/2016   Procedure: CATARACT EXTRACTION PHACO AND INTRAOCULAR LENS PLACEMENT (IOC)  right;  Surgeon: Mittie Gaskin, MD;  Location: [facility];  Service: Ophthalmology;  Laterality: Right;  pre diabetic latex sensitivity sleep apnea   CATARACT EXTRACTION W/PHACO Left 01/18/2017   Procedure: CATARACT EXTRACTION PHACO AND INTRAOCULAR LENS PLACEMENT (IOC) LEFT DIABETIC;  Surgeon: Mittie Gaskin, MD;  Location: [facility];  Service: Ophthalmology;  Laterality: Left;   COLONOSCOPY     COLONOSCOPY WITH PROPOFOL  N/A 04/09/2018   Procedure: COLONOSCOPY WITH PROPOFOL ;  Surgeon: Viktoria Lamar DASEN, MD;  Location: [facility];  Service: Endoscopy;  Laterality: N/A;   IRIDOTOMY / IRIDECTOMY Bilateral 2005   TOOTH EXTRACTION     Patient Active Problem List    Diagnosis Date Noted   Chronic bilateral low back pain with right-sided sciatica 09/29/2023   Hypertension associated with diabetes (HCC) 01/20/2023   Combined hyperlipidemia associated with type 2 diabetes mellitus (HCC) 01/20/2023   Intertrigo 11/01/2022   Stasis dermatitis of both legs 10/21/2022   Leg swelling 10/21/2022   Lymphedema of lower extremity 10/21/2022   Type 2 diabetes mellitus without complication, without long-term current use of insulin (HCC) 05/24/2022   Mixed hyperlipidemia 05/24/2022   Essential hypertension, benign 05/24/2022   Absolute anemia 05/24/2022   Gastroesophageal reflux disease without esophagitis 05/24/2022   Allergic rhinitis 05/24/2022   Asthma, allergic, moderate persistent, uncomplicated 05/24/2022   Bilateral chronic angle-closure glaucoma, indeterminate stage 05/24/2022   Airway hyperreactivity 08/29/2013   OSA on CPAP 08/29/2013    PCP: Fernand Fredy RAMAN, MD  REFERRING PROVIDER: Fernand Fredy, RAMAN, MD  REFERRING DIAG:  Diagnosis  G89.29,M54.41 (ICD-10-CM) - Chronic bilateral low back pain with right-sided sciatica    Rationale for Evaluation and Treatment: Rehabilitation  THERAPY DIAG:  Other low back pain  Difficulty in walking, not elsewhere classified  Pain in right hip  Muscle weakness (generalized)  Other lack of coordination  ONSET DATE: >3 years   SUBJECTIVE:  SUBJECTIVE STATEMENT:  Patient reports doing well overall. States no too sore from last visit.      From EVAL: Pt is a pleasant 73 y/o female presenting to PT eval for chronic LBP with R side hip pain. She reports onset of LBP over 3 years ago. Primary site of pain is mostly felt in R hip and lower spine. Pt reports hx of arthritis in lower back and R hip. She is now starting to feel arthritis pain in her  knees as well. Reports RLE is actually her good leg, has some issues with pain into groin region of LLE. Pt states strength in legs has been impacted and that her general mobility is worse. Pt can only ambulate comfortably by taking weight off of lower back by using her 4WW. She states not using 4WW so much for balance, but for reducing pain with gait. She is unable to stand up fully due to pain. Pt says she used to be very fit (took take karate, gymnastics years ago). She is also seeing OT for BLE lymphedema management. She endorses difficulty getting out of chairs, difficulty with steps, she must use a ramp to get in and out of her home. Pt reports my brain does not retain what I'm talking about, can lose train of thought.  PERTINENT HISTORY:  PMH per chart includes arthritis low back and left shoulder, bilateral carpal tunnel, asthma, glaucoma, HTN, leaky heart valve, sleep apnea, pre-diabetes, being seen by lymphedema specialist for BLE lymphedema   PAIN:  Are you having pain? LBP and R hip pain  Lowest pain level: 0/10  With standing: 2/10  Worst: 5-6/10   PRECAUTIONS: LATEX ALLERGY per chart, fall  RED FLAGS: None  WEIGHT BEARING RESTRICTIONS: No  FALLS:  Has patient fallen in last 6 months? No  LIVING ENVIRONMENT: Uses ramp to get into home due to difficulty with steps, has 4WW  PLOF: Independent  PATIENT GOALS:  Says due to back pain she is unable to reach my feet, has difficulty getting her socks on without a grabber, wants to be more flexible and stronger to increase ease with these activities and mobility    OBJECTIVE:  Note: Objective measures were completed at Evaluation unless otherwise noted.  DIAGNOSTIC FINDINGS:  No recent pertinent imaging available in chart  PATIENT SURVEYS:  Modified Oswestry score: 34%  Interpretation of scores: Score Category Description  0-20% Minimal Disability The patient can cope with most living activities. Usually no treatment  is indicated apart from advice on lifting, sitting and exercise  21-40% Moderate Disability The patient experiences more pain and difficulty with sitting, lifting and standing. Travel and social life are more difficult and they may be disabled from work. Personal care, sexual activity and sleeping are not grossly affected, and the patient can usually be managed by conservative means  41-60% Severe Disability Pain remains the main problem in this group, but activities of daily living are affected. These patients require a detailed investigation  61-80% Crippled Back pain impinges on all aspects of the patient's life. Positive intervention is required  81-100% Bed-bound  These patients are either bed-bound or exaggerating their symptoms  Bluford FORBES Zoe DELENA Karon DELENA, et al. Surgery versus conservative management of stable thoracolumbar fracture: the PRESTO feasibility RCT. [place]): [employer]; 2021 Nov. [facility] Technology Assessment, No. 25.62.) Appendix 3, Oswestry Disability Index category descriptors. Available from: FindJewelers.cz  Minimally Clinically Important Difference (MCID) = 12.8%  COGNITION: Overall cognitive status: Within functional limits  for tasks assessed, very pleasant pt can become tangential and requires redirection to activity    SENSATION: Intact to light touch BLE   MUSCLE LENGTH: deferred  POSTURE: increased thoracic kyphosis, rounded shoulders, unable to achieve full upright position due to pain, maintains increased hip and knee flexion when attempting to stand fully upright   PALPATION: deferred  Thoracolumbar AROM: Extension - unable to achieve neutral position, remains in significant flexion  Flexion - lacking at least 25%  Rotation - lacking at least 40% bilat  and pain-limited    LOWER EXTREMITY MMT:    MMT Right eval Left eval  Hip flexion 4- 3*  Hip extension    Hip abduction 4+ 4+  Hip adduction  4+ 4+  Hip internal rotation    Hip external rotation    Knee flexion 4 3*  Knee extension 4+ 4+  Ankle dorsiflexion 4 4+  Ankle plantarflexion    Ankle inversion    Ankle eversion     (Blank rows = not tested) *=pain limited   LUMBAR SPECIAL TESTS:  deferred  FUNCTIONAL TESTS:  10 meter walk test: 0.63 m/s crouched posture, decreased hip extension, decreased step-length and heel strike, elevated R>L shoulder, heavy BUE weightbearing on 4WW  GAIT: Distance walked: 10MWT/clinic distances (see above) Assistive device utilized: Environmental consultant - 4 wheeled Level of assistance: Modified independence Comments: gait mechanics impaired: decreased gait speed, crouched posture, decreased hip extension, decreased step-length and heel strike, elevated R>L shoulder, heavy BUE weightbearing on 4WW  10 Meter Walk Test: Patient instructed to walk 10 meters (32.8 ft) as quickly and as safely as possible at their normal speed x2 and at a fast speed x2. Time measured from 2 meter mark to 8 meter mark to accommodate ramp-up and ramp-down.  Normal speed 1: 0.66 m/s with rollator Normal speed 2: 0.66 m/s with rollator Normal speed 3: 0.68 m/s with upright 4WW Normal speed 4: 0.68 m/s with upright 4WW Average Normal speed: 0.67 m/s  Cut off scores: <0.4 m/s = household Ambulator, 0.4-0.8 m/s = limited community Ambulator, >0.8 m/s = community Ambulator, >1.2 m/s = crossing a street, <1.0 = increased fall risk MCID 0.05 m/s (small), 0.13 m/s (moderate), 0.06 m/s (significant)  (ANPTA Core Set of Outcome Measures for Adults with Neurologic Conditions, 2018)   MODIFIED OSWESTRY DISABILITY SCALE  Date: 12/11/2023 Score  Pain intensity 3 =  Pain medication provides me with moderate relief from pain.  2. Personal care (washing, dressing, etc.) 1 =  I can take care of myself normally, but it increases my pain.  3. Lifting 1 = I can lift heavy weights, but it causes increased pain.  4. Walking 4 = I can only walk  with crutches or a cane.  5. Sitting 1 =  I can only sit in my favorite chair as long as I like.  6. Standing 3 =  Pain prevents me from standing more than 1/2 hour.  7. Sleeping 1 = I can sleep well only by using pain medication.  8. Social Life 2 = Pain prevents me from participating in more energetic activities (eg. sports, dancing).  9. Traveling 1 =  I can travel anywhere, but it increases my pain.  10. Employment/ Homemaking 2 = I can perform most of my homemaking/job duties, but pain prevents me from performing more physically stressful activities (eg, lifting, vacuuming).  Total 19/50 = 38%   Interpretation of scores: Score Category Description  0-20% Minimal Disability The patient can cope  with most living activities. Usually no treatment is indicated apart from advice on lifting, sitting and exercise  21-40% Moderate Disability The patient experiences more pain and difficulty with sitting, lifting and standing. Travel and social life are more difficult and they may be disabled from work. Personal care, sexual activity and sleeping are not grossly affected, and the patient can usually be managed by conservative means  41-60% Severe Disability Pain remains the main problem in this group, but activities of daily living are affected. These patients require a detailed investigation  61-80% Crippled Back pain impinges on all aspects of the patient's life. Positive intervention is required  81-100% Bed-bound  These patients are either bed-bound or exaggerating their symptoms  Bluford FORBES Zoe DELENA Karon DELENA, et al. Surgery versus conservative management of stable thoracolumbar fracture: the PRESTO feasibility RCT. [place]): [employer]; 2021 Nov. [facility] Technology Assessment, No. 25.62.) Appendix 3, Oswestry Disability Index category descriptors. Available from: FindJewel[employer].cz  Minimally Clinically Important Difference (MCID) = 12.8%   Pt  performed 5 time sit<>stand (5xSTS): 21.78 sec without UE support sec (>15 sec indicates increased fall risk)      TREATMENT DATE: 12/20/23    Wellzone activities: Step tap(onto the step) 2 x 10  Seated hip add ball squeeze with ball hold 3 sec x 15 Seated hip abd with red TB 2 x 10  Knee ext L1- 2 x 10 reps  Knee flex L1- 2 x 10 reps Seated rows (wide grip) 1 x 10 L2- Improved ability to swing leg over seat Seated rows (neutral grip) 2 x 10 L2        PATIENT EDUCATION:  Education details: Provided education on findings of exam, indications for plan, prognoses, goals, how PT can help                 Person educated: Patient Education method: Explanation Education comprehension: verbalized understanding  HOME EXERCISE PROGRAM: Access Code: [email or web address] URL: [email or web address] Date: 10/05/2023 Prepared by: Lonni Gainer  Exercises - Supine Bridge  - 3 x weekly - 3 sets - 10 reps - Clamshell  - 3 x weekly - 3 sets - 10 reps - Supine Lower Trunk Rotation  - 1 x daily - 3 sets - 30 sec hold - Seated Hamstring Stretch  - 1 x daily - 3 sets - 30 hold - Seated Clamshell  - 1 x daily - 7 x weekly - 2 sets - 20 reps - 3 sec hold - Supine Transversus Abdominis Bracing - Hands on Stomach  - 3 x weekly - 3 sets - 10 reps - Supine Gluteal Sets  - 3 x weekly - 3 sets - 10 reps  ASSESSMENT:  CLINICAL IMPRESSION:  Treatment continued per plan of care including going over to well zone to perform gym based activities. Patient performed better today- overall less VC for use of gym equipment. She was able to sit on device for rowing today much easier. She learned a few activities that she can incorporate for hip strengthening in group room. No complaint of any back pain during session today.  She will continue to benefit from training to determine what she can do safely.  Pt will continue to benefit from skilled therapy to address remaining deficits in order to improve overall  QoL and return to PLOF.      OBJECTIVE IMPAIRMENTS: Abnormal gait, decreased activity tolerance, decreased balance, decreased mobility, difficulty walking, decreased strength, hypomobility, increased edema, improper body mechanics, postural dysfunction, and  pain.   ACTIVITY LIMITATIONS: carrying, lifting, bending, squatting, stairs, transfers, bed mobility, and locomotion level  PARTICIPATION LIMITATIONS: meal prep, cleaning, shopping, community activity, and yard work  PERSONAL FACTORS: Age, Fitness, Sex, Time since onset of injury/illness/exacerbation, and 3+ comorbidities: PMH per chart includes arthritis low back and left shoulder, bilateral carpal tunnel, asthma, glaucoma, HTN, leaky heart valve, sleep apnea, pre-diabetes, being seen by lymphedema specialist for BLE lymphedema  are also affecting patient's functional outcome.   REHAB POTENTIAL: Good  CLINICAL DECISION MAKING: Evolving/moderate complexity  EVALUATION COMPLEXITY: Moderate   GOALS: Goals reviewed with patient? Yes   SHORT TERM GOALS: Target date: 10/31/2023    Patient will be independent in home exercise program to improve strength/mobility for better functional independence with ADLs. Baseline:to be initiated; 11/06/2023=Patient reports knowledgeable of HEP but not always able to perform and sometimes painful so has to modify.  Goal status: MET   LONG TERM GOALS: Target date: 03/04/2024    Patient will reduce modified Oswestry score to <20 as to demonstrate minimal disability with ADLs including improved sleeping tolerance, walking/sitting tolerance etc for better mobility with ADLs.  Baseline: 34; 11/06/2023= 32; 12/11/2023= 38% Goal status: ONGOING  2.  Patient (> 13 years old) will complete five times sit to stand test in < 15 seconds indicating an increased LE strength and improved balance. Baseline: 09/26/2023= 26.46 sec with heavy UE Support;  11/06/2023= 22.31 sec without UE Support; 12/11/2023 =21.78 sec  without UE support Goal status: PROGRESSING  3.  Patient will increase Berg Balance score by > 6 points to demonstrate decreased fall risk during functional activities Baseline: 09/26/2023= 31/56; 11/06/2023= 40/56 Goal status: MET  4.  Patient will increase 10 meter walk test to >1.67m/s as to improve gait speed for better community ambulation and to reduce fall risk. Baseline:  0.63 m/s with 4WW; 0.74 m/s; 12/11/2203= 0.66 m/s with 4WW and 0.68 m/s with upright 4WW Goal status: PROGRESSING  5. Patient will transition from skilled PT services to independent with gym based program for long term plan for fitness and management of LE weakness/low back pain.   Baseline: Patient current engaged with outpatient PT services to assist with strength and low back pain management.  Goal status: NEW      PLAN:  PT FREQUENCY: 1-2x/week  PT DURATION: 12 weeks  PLANNED INTERVENTIONS: [id number]- PT Re-evaluation, 97750- Physical Performance Testing, 97110-Therapeutic exercises, 97530- Therapeutic activity, [id number]- Neuromuscular re-education, 97535- Self Care, [id number]- Manual therapy, [id number]- Gait training, [phone]- Orthotic Initial, [phone]- Orthotic/Prosthetic subsequent, [phone]- Canalith repositioning, Patient/Family education, Balance training, Stair training, Taping, Joint mobilization, Spinal mobilization, Vestibular training, DME instructions, Cryotherapy, and Moist heat.  PLAN FOR NEXT SESSION:   Well zone for LE strengthening and mobility training. Continue to progress functional mobility in standing and seated if needed  Continue with some static and dynamic balance activities as appropriate. Manual therapy for lumbar ROM/pain Continue to progress HEP to activities that promote mobility with the least amount of pain as possible.   Chyrl London, PT Physical Therapist - [facility]  12/21/23, 8:29 AM

## 2023-12-20 NOTE — Telephone Encounter (Signed)
 Patient left VM stating her insurance told her that [facility] no longer wants her to get physical therapy at the hospital. She is upset because she said the physical therapy is really helping her and she wants to continue it.   I do not see anywhere where Dr. Fernand said for her to d/c PT.  Called patient and explained that Dr. Fernand never said that she cannot receive PT anymore. Patient states that she received a letter in the mail from her insurance company saying she cannot get PT any longer. She has PT today and is going to take the letter to them and have them look at it.

## 2023-12-21 DIAGNOSIS — I890 Lymphedema, not elsewhere classified: Secondary | ICD-10-CM | POA: Diagnosis not present

## 2023-12-24 ENCOUNTER — Other Ambulatory Visit: Payer: Self-pay | Admitting: Internal Medicine

## 2023-12-25 ENCOUNTER — Ambulatory Visit: Admitting: Occupational Therapy

## 2023-12-25 ENCOUNTER — Ambulatory Visit

## 2023-12-25 DIAGNOSIS — M6281 Muscle weakness (generalized): Secondary | ICD-10-CM | POA: Diagnosis not present

## 2023-12-25 DIAGNOSIS — M25551 Pain in right hip: Secondary | ICD-10-CM | POA: Diagnosis not present

## 2023-12-25 DIAGNOSIS — R262 Difficulty in walking, not elsewhere classified: Secondary | ICD-10-CM

## 2023-12-25 DIAGNOSIS — I890 Lymphedema, not elsewhere classified: Secondary | ICD-10-CM | POA: Diagnosis not present

## 2023-12-25 DIAGNOSIS — R278 Other lack of coordination: Secondary | ICD-10-CM

## 2023-12-25 DIAGNOSIS — M5459 Other low back pain: Secondary | ICD-10-CM

## 2023-12-25 NOTE — Therapy (Signed)
 OUTPATIENT PHYSICAL THERAPY THORACOLUMBAR TREATMENT   Patient Name: Judith Arnold MRN: [id number] DOB:Aug 30, 1950, 73 y.o., female Today's Date: 12/25/2023  END OF SESSION:  PT End of Session - 12/25/23 1312     Visit Number 24    Number of Visits 44    Date for Recertification  03/04/24    Progress Note Due on Visit 30    PT Start Time 1316    PT Stop Time 1359    PT Time Calculation (min) 43 min    Equipment Utilized During Treatment Gait belt    Activity Tolerance Patient tolerated treatment well;Patient limited by pain    Behavior During Therapy WFL for tasks assessed/performed            Past Medical History:  Diagnosis Date   Allergy    Arthritis    lower back, left shoulder   Asthma    Carpal tunnel syndrome on both sides    Dental bridge present    top   Dental crown present    multiple   Family history of adverse reaction to anesthesia    Father and sister - PONV   GERD (gastroesophageal reflux disease)    Glaucoma    Hypertension    Leaky heart valve    Pre-diabetes    Sleep apnea    Past Surgical History:  Procedure Laterality Date   CATARACT EXTRACTION W/PHACO Right 11/16/2016   Procedure: CATARACT EXTRACTION PHACO AND INTRAOCULAR LENS PLACEMENT (IOC)  right;  Surgeon: Mittie Gaskin, MD;  Location: [facility];  Service: Ophthalmology;  Laterality: Right;  pre diabetic latex sensitivity sleep apnea   CATARACT EXTRACTION W/PHACO Left 01/18/2017   Procedure: CATARACT EXTRACTION PHACO AND INTRAOCULAR LENS PLACEMENT (IOC) LEFT DIABETIC;  Surgeon: Mittie Gaskin, MD;  Location: [facility];  Service: Ophthalmology;  Laterality: Left;   COLONOSCOPY     COLONOSCOPY WITH PROPOFOL  N/A 04/09/2018   Procedure: COLONOSCOPY WITH PROPOFOL ;  Surgeon: Viktoria Lamar DASEN, MD;  Location: [facility];  Service: Endoscopy;  Laterality: N/A;   IRIDOTOMY / IRIDECTOMY Bilateral 2005   TOOTH EXTRACTION     Patient Active Problem List    Diagnosis Date Noted   Chronic bilateral low back pain with right-sided sciatica 09/29/2023   Hypertension associated with diabetes (HCC) 01/20/2023   Combined hyperlipidemia associated with type 2 diabetes mellitus (HCC) 01/20/2023   Intertrigo 11/01/2022   Stasis dermatitis of both legs 10/21/2022   Leg swelling 10/21/2022   Lymphedema of lower extremity 10/21/2022   Type 2 diabetes mellitus without complication, without long-term current use of insulin (HCC) 05/24/2022   Mixed hyperlipidemia 05/24/2022   Essential hypertension, benign 05/24/2022   Absolute anemia 05/24/2022   Gastroesophageal reflux disease without esophagitis 05/24/2022   Allergic rhinitis 05/24/2022   Asthma, allergic, moderate persistent, uncomplicated 05/24/2022   Bilateral chronic angle-closure glaucoma, indeterminate stage 05/24/2022   Airway hyperreactivity 08/29/2013   OSA on CPAP 08/29/2013    PCP: Fernand Fredy RAMAN, MD  REFERRING PROVIDER: Fernand Fredy, RAMAN, MD  REFERRING DIAG:  Diagnosis  G89.29,M54.41 (ICD-10-CM) - Chronic bilateral low back pain with right-sided sciatica    Rationale for Evaluation and Treatment: Rehabilitation  THERAPY DIAG:  Other low back pain  Difficulty in walking, not elsewhere classified  Pain in right hip  Muscle weakness (generalized)  Other lack of coordination  ONSET DATE: >3 years   SUBJECTIVE:  SUBJECTIVE STATEMENT:  Patient Reports she received her Upright walker over the weekend and brought it in for inspection and training today.     From EVAL: Pt is a pleasant 73 y/o female presenting to PT eval for chronic LBP with R side hip pain. She reports onset of LBP over 3 years ago. Primary site of pain is mostly felt in R hip and lower spine. Pt reports hx of arthritis in lower back and R hip. She  is now starting to feel arthritis pain in her knees as well. Reports RLE is actually her good leg, has some issues with pain into groin region of LLE. Pt states strength in legs has been impacted and that her general mobility is worse. Pt can only ambulate comfortably by taking weight off of lower back by using her 4WW. She states not using 4WW so much for balance, but for reducing pain with gait. She is unable to stand up fully due to pain. Pt says she used to be very fit (took take karate, gymnastics years ago). She is also seeing OT for BLE lymphedema management. She endorses difficulty getting out of chairs, difficulty with steps, she must use a ramp to get in and out of her home. Pt reports my brain does not retain what I'm talking about, can lose train of thought.  PERTINENT HISTORY:  PMH per chart includes arthritis low back and left shoulder, bilateral carpal tunnel, asthma, glaucoma, HTN, leaky heart valve, sleep apnea, pre-diabetes, being seen by lymphedema specialist for BLE lymphedema   PAIN:  Are you having pain? LBP and R hip pain  Lowest pain level: 0/10  With standing: 2/10  Worst: 5-6/10   PRECAUTIONS: LATEX ALLERGY per chart, fall  RED FLAGS: None  WEIGHT BEARING RESTRICTIONS: No  FALLS:  Has patient fallen in last 6 months? No  LIVING ENVIRONMENT: Uses ramp to get into home due to difficulty with steps, has 4WW  PLOF: Independent  PATIENT GOALS:  Says due to back pain she is unable to reach my feet, has difficulty getting her socks on without a grabber, wants to be more flexible and stronger to increase ease with these activities and mobility    OBJECTIVE:  Note: Objective measures were completed at Evaluation unless otherwise noted.  DIAGNOSTIC FINDINGS:  No recent pertinent imaging available in chart  PATIENT SURVEYS:  Modified Oswestry score: 34%  Interpretation of scores: Score Category Description  0-20% Minimal Disability The patient can cope  with most living activities. Usually no treatment is indicated apart from advice on lifting, sitting and exercise  21-40% Moderate Disability The patient experiences more pain and difficulty with sitting, lifting and standing. Travel and social life are more difficult and they may be disabled from work. Personal care, sexual activity and sleeping are not grossly affected, and the patient can usually be managed by conservative means  41-60% Severe Disability Pain remains the main problem in this group, but activities of daily living are affected. These patients require a detailed investigation  61-80% Crippled Back pain impinges on all aspects of the patient's life. Positive intervention is required  81-100% Bed-bound  These patients are either bed-bound or exaggerating their symptoms  Bluford FORBES Zoe DELENA Karon DELENA, et al. Surgery versus conservative management of stable thoracolumbar fracture: the PRESTO feasibility RCT. [place]): [employer]; 2021 Nov. [facility] Technology Assessment, No. 25.62.) Appendix 3, Oswestry Disability Index category descriptors. Available from: FindJewelers.cz  Minimally Clinically Important Difference (MCID) = 12.8%  COGNITION:  Overall cognitive status: Within functional limits for tasks assessed, very pleasant pt can become tangential and requires redirection to activity    SENSATION: Intact to light touch BLE   MUSCLE LENGTH: deferred  POSTURE: increased thoracic kyphosis, rounded shoulders, unable to achieve full upright position due to pain, maintains increased hip and knee flexion when attempting to stand fully upright   PALPATION: deferred  Thoracolumbar AROM: Extension - unable to achieve neutral position, remains in significant flexion  Flexion - lacking at least 25%  Rotation - lacking at least 40% bilat  and pain-limited    LOWER EXTREMITY MMT:    MMT Right eval Left eval  Hip flexion 4- 3*  Hip  extension    Hip abduction 4+ 4+  Hip adduction 4+ 4+  Hip internal rotation    Hip external rotation    Knee flexion 4 3*  Knee extension 4+ 4+  Ankle dorsiflexion 4 4+  Ankle plantarflexion    Ankle inversion    Ankle eversion     (Blank rows = not tested) *=pain limited   LUMBAR SPECIAL TESTS:  deferred  FUNCTIONAL TESTS:  10 meter walk test: 0.63 m/s crouched posture, decreased hip extension, decreased step-length and heel strike, elevated R>L shoulder, heavy BUE weightbearing on 4WW  GAIT: Distance walked: 10MWT/clinic distances (see above) Assistive device utilized: Environmental consultant - 4 wheeled Level of assistance: Modified independence Comments: gait mechanics impaired: decreased gait speed, crouched posture, decreased hip extension, decreased step-length and heel strike, elevated R>L shoulder, heavy BUE weightbearing on 4WW  10 Meter Walk Test: Patient instructed to walk 10 meters (32.8 ft) as quickly and as safely as possible at their normal speed x2 and at a fast speed x2. Time measured from 2 meter mark to 8 meter mark to accommodate ramp-up and ramp-down.  Normal speed 1: 0.66 m/s with rollator Normal speed 2: 0.66 m/s with rollator Normal speed 3: 0.68 m/s with upright 4WW Normal speed 4: 0.68 m/s with upright 4WW Average Normal speed: 0.67 m/s  Cut off scores: <0.4 m/s = household Ambulator, 0.4-0.8 m/s = limited community Ambulator, >0.8 m/s = community Ambulator, >1.2 m/s = crossing a street, <1.0 = increased fall risk MCID 0.05 m/s (small), 0.13 m/s (moderate), 0.06 m/s (significant)  (ANPTA Core Set of Outcome Measures for Adults with Neurologic Conditions, 2018)   MODIFIED OSWESTRY DISABILITY SCALE  Date: 12/11/2023 Score  Pain intensity 3 =  Pain medication provides me with moderate relief from pain.  2. Personal care (washing, dressing, etc.) 1 =  I can take care of myself normally, but it increases my pain.  3. Lifting 1 = I can lift heavy weights, but it causes  increased pain.  4. Walking 4 = I can only walk with crutches or a cane.  5. Sitting 1 =  I can only sit in my favorite chair as long as I like.  6. Standing 3 =  Pain prevents me from standing more than 1/2 hour.  7. Sleeping 1 = I can sleep well only by using pain medication.  8. Social Life 2 = Pain prevents me from participating in more energetic activities (eg. sports, dancing).  9. Traveling 1 =  I can travel anywhere, but it increases my pain.  10. Employment/ Homemaking 2 = I can perform most of my homemaking/job duties, but pain prevents me from performing more physically stressful activities (eg, lifting, vacuuming).  Total 19/50 = 38%   Interpretation of scores: Score Category Description  0-20%  Minimal Disability The patient can cope with most living activities. Usually no treatment is indicated apart from advice on lifting, sitting and exercise  21-40% Moderate Disability The patient experiences more pain and difficulty with sitting, lifting and standing. Travel and social life are more difficult and they may be disabled from work. Personal care, sexual activity and sleeping are not grossly affected, and the patient can usually be managed by conservative means  41-60% Severe Disability Pain remains the main problem in this group, but activities of daily living are affected. These patients require a detailed investigation  61-80% Crippled Back pain impinges on all aspects of the patient's life. Positive intervention is required  81-100% Bed-bound  These patients are either bed-bound or exaggerating their symptoms  Bluford FORBES Zoe DELENA Karon DELENA, et al. Surgery versus conservative management of stable thoracolumbar fracture: the PRESTO feasibility RCT. [place] ([place]): [employer]; 2021 Nov. [facility] Technology Assessment, No. 25.62.) Appendix 3, Oswestry Disability Index category descriptors. Available from: [employer]cz  Minimally Clinically  Important Difference (MCID) = 12.8%   Pt performed 5 time sit<>stand (5xSTS): 21.78 sec without UE support sec (>15 sec indicates increased fall risk)      TREATMENT DATE: 12/25/23    *Patient brought in Upright walker that she most recently purchased.   TA:  Inspection of new equipment and making sure armrest are appropriate height- adjusted to assist with most upright posture while not causing excessive strain on Upper traps. Patient able to distribute her weight through bilateral forearms and reported most comfortable with height at mark- 5 today.   Several rounds of walking with upright walker - measuring 150 feet and patient perform with trials varying from 1 min 13 sec down to 1 min 4 sec and then compared to a regular 4WW- patient presented with much improved posture and faster time with use of new upright walker today. Patient ambulated at least 4 trials with upright walker and 2 trials with 4WW.   Instructed in how to use brakes for safe sit to stand transfers- Patient required more VC as she is used to her old walker in which the brakes did not work. She was able to apply lock for brakes and able to stand and sit safely today without significant difficulty. She was able to push up from higher armrest than she is used to but still successful and able to perform without increased report of pain.       PATIENT EDUCATION:  Education details: Provided education on findings of exam, indications for plan, prognoses, goals, how PT can help                 Person educated: Patient Education method: Explanation Education comprehension: verbalized understanding  HOME EXERCISE PROGRAM: Access Code: [email or web address] URL: [email or web address] Date: 10/05/2023 Prepared by: Lonni Gainer  Exercises - Supine Bridge  - 3 x weekly - 3 sets - 10 reps - Clamshell  - 3 x weekly - 3 sets - 10 reps - Supine Lower Trunk Rotation  - 1 x daily - 3 sets - 30 sec hold - Seated Hamstring  Stretch  - 1 x daily - 3 sets - 30 hold - Seated Clamshell  - 1 x daily - 7 x weekly - 2 sets - 20 reps - 3 sec hold - Supine Transversus Abdominis Bracing - Hands on Stomach  - 3 x weekly - 3 sets - 10 reps - Supine Gluteal Sets  - 3 x weekly - 3 sets - 10  reps  ASSESSMENT:  CLINICAL IMPRESSION: Patient presents with new upright walker and treatment focused on ensuring walker was safe and ready for use. Adjusted height, checked brakes and assembly to ensure safety. Patient was able to navigate well with device with fast speeds as compared to a regular 4WW. Patient presents with much improved posture and overall improved gait speed. Encouraged her to practice with walker so she can become as comfortable with new walker as she is with her older walker. No report of increased LBP with endurance ambulation in a more upright position.  Pt will continue to benefit from skilled therapy to address remaining deficits in order to improve overall QoL and return to PLOF.      OBJECTIVE IMPAIRMENTS: Abnormal gait, decreased activity tolerance, decreased balance, decreased mobility, difficulty walking, decreased strength, hypomobility, increased edema, improper body mechanics, postural dysfunction, and pain.   ACTIVITY LIMITATIONS: carrying, lifting, bending, squatting, stairs, transfers, bed mobility, and locomotion level  PARTICIPATION LIMITATIONS: meal prep, cleaning, shopping, community activity, and yard work  PERSONAL FACTORS: Age, Fitness, Sex, Time since onset of injury/illness/exacerbation, and 3+ comorbidities: PMH per chart includes arthritis low back and left shoulder, bilateral carpal tunnel, asthma, glaucoma, HTN, leaky heart valve, sleep apnea, pre-diabetes, being seen by lymphedema specialist for BLE lymphedema  are also affecting patient's functional outcome.   REHAB POTENTIAL: Good  CLINICAL DECISION MAKING: Evolving/moderate complexity  EVALUATION COMPLEXITY: Moderate   GOALS: Goals  reviewed with patient? Yes   SHORT TERM GOALS: Target date: 10/31/2023    Patient will be independent in home exercise program to improve strength/mobility for better functional independence with ADLs. Baseline:to be initiated; 11/06/2023=Patient reports knowledgeable of HEP but not always able to perform and sometimes painful so has to modify.  Goal status: MET   LONG TERM GOALS: Target date: 03/04/2024    Patient will reduce modified Oswestry score to <20 as to demonstrate minimal disability with ADLs including improved sleeping tolerance, walking/sitting tolerance etc for better mobility with ADLs.  Baseline: 34; 11/06/2023= 32; 12/11/2023= 38% Goal status: ONGOING  2.  Patient (> 7 years old) will complete five times sit to stand test in < 15 seconds indicating an increased LE strength and improved balance. Baseline: 09/26/2023= 26.46 sec with heavy UE Support;  11/06/2023= 22.31 sec without UE Support; 12/11/2023 =21.78 sec without UE support Goal status: PROGRESSING  3.  Patient will increase Berg Balance score by > 6 points to demonstrate decreased fall risk during functional activities Baseline: 09/26/2023= 31/56; 11/06/2023= 40/56 Goal status: MET  4.  Patient will increase 10 meter walk test to >1.3m/s as to improve gait speed for better community ambulation and to reduce fall risk. Baseline:  0.63 m/s with 4WW; 0.74 m/s; 12/11/2203= 0.66 m/s with 4WW and 0.68 m/s with upright 4WW Goal status: PROGRESSING  5. Patient will transition from skilled PT services to independent with gym based program for long term plan for fitness and management of LE weakness/low back pain.   Baseline: Patient current engaged with outpatient PT services to assist with strength and low back pain management.  Goal status: NEW      PLAN:  PT FREQUENCY: 1-2x/week  PT DURATION: 12 weeks  PLANNED INTERVENTIONS: 97164- PT Re-evaluation, 97750- Physical Performance Testing, 97110-Therapeutic  exercises, 97530- Therapeutic activity, [id number]- Neuromuscular re-education, 97535- Self Care, [id number]- Manual therapy, [phone]- Gait training, [phone]- Orthotic Initial, [phone]- Orthotic/Prosthetic subsequent, [phone]- Canalith repositioning, Patient/Family education, Balance training, Stair training, Taping, Joint mobilization, Spinal mobilization, Vestibular training, DME instructions, Cryotherapy,  and Moist heat.  PLAN FOR NEXT SESSION:   Well zone for LE strengthening and mobility training. Continue to progress functional mobility in standing and seated if needed  Continue with some static and dynamic balance activities as appropriate. Manual therapy for lumbar ROM/pain Continue to progress HEP to activities that promote mobility with the least amount of pain as possible.   Chyrl London, PT Physical Therapist - [facility]  12/25/23, 2:37 PM

## 2023-12-25 NOTE — Therapy (Signed)
 OUTPATIENT OCCUPATIONAL THERAPY TREATMENT NOTE  BILATERAL LOWER EXTREMITY LYMPHEDEMA  Patient Name: Judith Arnold MRN: [id number] DOB:04-20-1950, 73 y.o., female Today's Date: 12/25/2023  REPORTING PERIOD:   END OF SESSION:   OT End of Session - 12/25/23 1413     Visit Number 42    Number of Visits 72    Date for Recertification  01/16/24    OT Start Time 0205    OT Stop Time 0258    OT Time Calculation (min) 53 min    Activity Tolerance Patient tolerated treatment well;No increased pain    Behavior During Therapy WFL for tasks assessed/performed           Past Medical History:  Diagnosis Date   Allergy    Arthritis    lower back, left shoulder   Asthma    Carpal tunnel syndrome on both sides    Dental bridge present    top   Dental crown present    multiple   Family history of adverse reaction to anesthesia    Father and sister - PONV   GERD (gastroesophageal reflux disease)    Glaucoma    Hypertension    Leaky heart valve    Pre-diabetes    Sleep apnea    Past Surgical History:  Procedure Laterality Date   CATARACT EXTRACTION W/PHACO Right 11/16/2016   Procedure: CATARACT EXTRACTION PHACO AND INTRAOCULAR LENS PLACEMENT (IOC)  right;  Surgeon: Mittie Gaskin, MD;  Location: [facility];  Service: Ophthalmology;  Laterality: Right;  pre diabetic latex sensitivity sleep apnea   CATARACT EXTRACTION W/PHACO Left 01/18/2017   Procedure: CATARACT EXTRACTION PHACO AND INTRAOCULAR LENS PLACEMENT (IOC) LEFT DIABETIC;  Surgeon: Mittie Gaskin, MD;  Location: [facility];  Service: Ophthalmology;  Laterality: Left;   COLONOSCOPY     COLONOSCOPY WITH PROPOFOL  N/A 04/09/2018   Procedure: COLONOSCOPY WITH PROPOFOL ;  Surgeon: Viktoria Lamar DASEN, MD;  Location: [facility];  Service: Endoscopy;  Laterality: N/A;   IRIDOTOMY / IRIDECTOMY Bilateral 2005   TOOTH EXTRACTION     Patient Active Problem List   Diagnosis Date Noted   Chronic  bilateral low back pain with right-sided sciatica 09/29/2023   Hypertension associated with diabetes (HCC) 01/20/2023   Combined hyperlipidemia associated with type 2 diabetes mellitus (HCC) 01/20/2023   Intertrigo 11/01/2022   Stasis dermatitis of both legs 10/21/2022   Leg swelling 10/21/2022   Lymphedema of lower extremity 10/21/2022   Type 2 diabetes mellitus without complication, without long-term current use of insulin (HCC) 05/24/2022   Mixed hyperlipidemia 05/24/2022   Essential hypertension, benign 05/24/2022   Absolute anemia 05/24/2022   Gastroesophageal reflux disease without esophagitis 05/24/2022   Allergic rhinitis 05/24/2022   Asthma, allergic, moderate persistent, uncomplicated 05/24/2022   Bilateral chronic angle-closure glaucoma, indeterminate stage 05/24/2022   Airway hyperreactivity 08/29/2013   OSA on CPAP 08/29/2013    PCP: Fredy GORMAN Bathe, MD  REFERRING PROVIDER: same  REFERRING DIAG: lymphedema I89.0  THERAPY DIAG:  Lymphedema, not elsewhere classified  Rationale for Evaluation and Treatment: Rehabilitation  ONSET DATE: Pt reports onset of L ankle / leg swelling after injury 20-30 yrs ago. Then about 1 year ago legs started swelling and Pt having prickly sensation on shins and thighs bilaterally.   SUBJECTIVE:  SUBJECTIVE STATEMENT: Judith Arnold presents to OT for treatment of BLE lymphedema 2/2 suspected CVI and obesity. Pt is unaccompanied today. Pt denies lymphedema related pain in her legs. Pt brings both Circaid compression stocking alternatives to clinic today.   (INITIAL EVAL 07/12/23 : Judith Arnold is referred to Occupational Therapy by Fredy GORMAN Bathe, MD,  for evaluation and treatment of BLE lymphedema.  Pt reports onset of L ankle and distal leg swelling  about 30 years ago, then noticed RLE swelling and worsening LLE swelling about 1 year ago at same time she started having unusual sensation in legs. Pt denies previously having lymphedema treatment. Pt reports paternal aunt had leg swelling. Pt is unable to wear compression stockings because she is unable to reach feet and distal legs to don and doff them due to back and hip pain.  PERTINENT HISTORY: Relevant to lymphedema (OSA (denies using CPAP), BLE stasis dermatitis, HTN, DM 2 (Pt states she is prediabetic only), B glaucoma, Back and R hip OA  PAIN:  Are you having LE-related pain? Yes: NPRS scale: not rated/10 Pain location: medial proximal thighs w abduction, bilateral groin, buttocks, back, legs Pain description: like stickers, tired, sore Aggravating factors: standing, walking, dependent sitting Relieving factors: elevation  PRECAUTIONS: Fall and Other: LYMPHEDEMA precautions  (DM 2 and asthma)  WEIGHT BEARING RESTRICTIONS: No  FALLS:  Has patient fallen in last 6 months? yes Fell asleep at the computer and fell onto the floor [place] when storm door hit me  LIVING ENVIRONMENT: Lives with: alone Lives in: House/apartment Stairs: No;  Has following equipment at home: Environmental consultant - 4 wheeled  OCCUPATION: retired Scientist, research (life sciences)  LEISURE: reading and taking notes on current events and politics  HAND DOMINANCE: right   PRIOR LEVEL OF FUNCTION: Independent with household mobility with device, Requires assistive device for independence, Needs assistance with ADLs, Needs assistance with homemaking, and Needs assistance with gait  PATIENT GOALS: reduce swelling and keep it from getting worse  OBJECTIVE: Note: Objective measures were completed at Evaluation unless otherwise noted.  COGNITION:  Overall cognitive status: impaired short term  memory   OBSERVATIONS / OTHER ASSESSMENTS: Mild, Stage II, BLE Lymphedema 2/2 suspected venous insufficiency and hx of obesity    SENSATION: Reports uncomfortable scratching, or sticker-like  sensation on anterior thighs   POSTURE: Raised walker handles 2 notches to increase upright spinal alignment when walking to limit falls risk. Handles need to be raised at least 1 hole more for safe upright posture when walking.  Upright sitting posture: head forward , shoulders forward and rounded- appears to be flexible kyphosis  LE ROM: WFL for ankles and knees. Pt reports limited hip abduction  LE MMT: [facility]. Presenting with generalized weakness and debility  LYMPHEDEMA ASSESSMENTS:   SURGERY TYPE/DATE: Non-cancer related limb swelling  HX INFECTIONS: positive for 1 episode cellulitis treated w antibiotics  Hx WOUNDS: denies   BLE COMPARATIVE LIMB VOLUMETRICS: INITIAL 07/19/23  LANDMARK RIGHT (dominant)  R LEG (A-D) 3525.1 ml  R THIGH (E-G) ml  R FULL LIMB (A-G) ml  Limb Volume differential (LVD)  %  Volume change since initial %  Volume change overall V  (Blank rows = not tested)  LANDMARK LEFT    L LEG (A-D) 3769.6 ml  L THIGH (E-G) ml  L FULL LIMB (A-G) ml  Limb Volume differential (LVD)  Limb Volume Differential (LVD) measures 6.5%, L>R.  Volume change since initial %  Volume change overall %  RLE COMPARATIVE LIMB VOLUMETRICS: VISIT 9 08/15/23  LANDMARK RIGHT (dominant)  R LEG (A-D) 3549.4 ml  R THIGH (E-G) ml  R FULL LIMB (A-G) ml  Limb Volume differential (LVD)  %  Volume change since initial R LEG volume reduced by 5.84% since initially measured on 07/18/21  Volume change overall V  (Blank rows = not tested)  RLE COMPARATIVE LIMB VOLUMETRICS: VISIT 19 10/03/23  LANDMARK RIGHT (dominant)  R LEG (A-D) 3549.4 ml  R THIGH (E-G) ml  R FULL LIMB (A-G) ml  Limb Volume differential (LVD)  %  Volume change since initial R LEG volume reduced by 14.7% since initially measured on 07/18/21  Volume change overall V    BLE COMPARATIVE LIMB VOLUMETRICS: 29th Visit  LANDMARK RIGHT  (dominant)  R LEG (A-D) 3438.1 ml  R THIGH (E-G) ml  R FULL LIMB (A-G) ml  Limb Volume differential (LVD)  %  Volume change since last measured on 10/03/23 (19th visit) DECREASED by 0.67%. R Leg limb volume stable.  Volume change overall Overall R Leg volume reduction = 2.5%. Goal not me to date  (Blank rows = not tested)  LANDMARK LEFT    L LEG (A-D) 4174.4  ml  L THIGH (E-G) ml  L FULL LIMB (A-G) ml  Limb Volume differential (LVD)  .  Volume change since initial on 07/19/23 L LEG volume DECREASED by 10.7% since initially measured on 07/19/23  Volume change overall L LEG volume DECREASED by 10.7& since initially measured on 07/19/23  (Blank rows = not tested)  Mild, Stage  II, Bilateral Lower Extremity Lymphedema 2/2 CVI and Obesity  Skin  Description Hyper-Keratosis Peau' de Orange Shiny Tight Fibrotic/ Indurated Fatty Doughy Spongy/ boggy       R>L x  x   Skin dry Flaky WNL Macerated   mildly      Color Redness Varicosities Blanching Hemosiderin Stain Mottled   x     x   Odor Malodorous Yeast Fungal infection  WNL      x   Temperature Warm Cool wnl    x     Pitting Edema   1+ 2+ 3+ 4+ Non-pitting         x   Girth Symmetrical Asymmetrical                   Distribution    R>L toes to groin    Stemmer Sign Positive Negative   +    Lymphorrhea History Of:  Present Absent     x    Wounds History Of Present Absent Venous Arterial Pressure Sheer     x        Signs of Infection Redness Warmth Erythema Acute Swelling Drainage Borders                    Sensation Light Touch Deep pressure Hypersensitivity   Present Impaired Present Impaired Absent Impaired   x Tactile  x  x     Nails WNL   Fungus nail dystrophy   x     Hair Growth Symmetrical Asymmetrical   x    Skin Creases Base of toes  Ankles   Base of Fingers knees       Abdominal pannus Thigh Lobules  Face/neck   x x  x      (Blank rows = not tested)    LYMPHEDEMA LIFE IMPACT SCALE  (LLIS): TBA   TREATMENT THIS DATE:  Pt edu for  lymphedema progress towards goals and self-care  LLE/LLQ MLD BLE knee length Circaid leggings with 30-40 mmHg-Mod A to don 2/2 time constraints  PATIENT EDUCATION:  Continued Pt/ CG edu for lymphedema self care home program throughout session. Topics include outcome of comparative limb volumetrics- starting limb volume differentials (LVDs), technology and gradient techniques used for short stretch, multilayer compression wrapping, simple self-MLD, therapeutic lymphatic pumping exercises, skin/nail care, LE precautions, compression garment recommendations and specifications, wear and care schedule and compression garment donning / doffing w assistive devices. Discussed progress towards all OT goals since commencing CDT. Discussed detrimental impact of obesity on lower and upper extremity lymphedema over time. Reviewed OT goals for lymphedema care with Pt and discussed progress to date.  All questions answered to the Pt's satisfaction. Good return. Person educated: Patient  Education method: Explanation, Demonstration, and Handouts Education comprehension: verbalized understanding, returned demonstration, verbal cues required, and needs further education   HOME EXERCISE PROGRAM: BLE lymphatic pumping there ex using- 1 sets of 10 reps, each exercise in order-  1-2 x daily, bilaterally Simple self MLD 1 x daily Daily skin care to increase hydration, skin mobility and decrease infection risk- can be done during MLD During Intensive Phase CDT: Compression wraps 23/7 until limb volume reduction complete During Self-management Phase CDT: Fit with appropriate compression garments or alternatives. Consider BLE, knee length, Mediven, custom, CircAid , Velcro style leggings over soft cotton liners.  ASSESSMENT: CLINICAL IMPRESSION:  Pt tolerated LLE/LLQ MLD well today without pain. Provided skin care to limit infection risk throughout MLD. Applied CircAid  compression garment alternatives as established to bilateral legs at end of session with min A. Pt continues to improve her ability to gauge these garments. Pt in agreement with reduction in OT frequency to 1 x weekly for the next 4 weeks as she transitions to self management of chronic, progressive lymphedema. Cont as per POC.  (INITIAL EVAL 07/12/23: Judith Arnold is a 73 yo female presenting with chronic, progressive, BLE swelling 2/2 unknown etiology, which at first glance appears to be circulatory in nature due to skin coloring at distal legs where swelling is most invested. Pt is presently unable to wear traditional elastic compression garments due to difficulty donning and doffing them.  Chronic, progressive lymphedema with associated skin changes, including fibrosis, limits this patent's functional performance in all occupational domains, including functional ambulation and mobility, basic and instrumental ADLs (lower body dressing, LB bathing, fitting street shoes and LB clothing, driving, shopping, and home management. It also limits perform productive activities, leisure pursuits, and participation in social and community activities. BLE lymphedema contributes to elevated infection risk. Pt will benefit from skilled OT for Complete Decongestive Therapy (CDT), which  typically includes manual lymphatic drainage (MLD), skin care to limit' infection risk and increase skin excursion, lymphatic pumping exercise, and during the Intensive Phase multilayer, gradient compression bandaging to reduce limb volume. Once limb volume is reduced as much as possible, Pt is fitted with appropriate compression garments and/ or devices and transitions into the self management phase of care consisting of follow long support PRN.    Pt understands that her fair prognosis will become poor without daily assistance with compression wrapping since she is unable to reach feet and legs to apply them herself. Pt assures  OT that she has a friend she believes is willing to assist her between OT sessions. )   OBJECTIVE IMPAIRMENTS: decreased standing and walking tolerance, activity tolerance, decreased balance, decreased knowledge of condition, decreased knowledge  of use of DME, decreased mobility, decreased ROM, decreased strength, increased edema, impaired flexibility, impaired sensation, impaired UE functional use, impaired vision/reception, pain, and chronic, progressive, BLE swelling and associated skin changes, at increased infection risk   ACTIVITY LIMITATIONS: Functional ambulation and mobility (lifting, carrying, steps/stairs, transfers, squatting) , Basic and instrumental ADLs, leisure pursuits, productive activities, social participation  PARTICIPATION LIMITATIONS: meal prep, cleaning, laundry, driving, shopping, yard work, and    PERSONAL FACTORS: Age, Fitness, Past/current experiences, Time since onset of injury/illness/exacerbation, 2+ co morbidities: OSA, HTN,  are also affecting patient's functional outcome.   REHAB POTENTIAL: Good  EVALUATION COMPLEXITY: Moderate   GOALS: Goals reviewed with patient? Yes  SHORT TERM GOALS: Target date: 4th OT Rx visit   Pt will demonstrate understanding of lymphedema precautions and prevention strategies with modified independence using a printed reference to identify at least 5 precautions and discussing how s/he may implement them into daily life to reduce risk of progression with extra time. Baseline:Max A Goal status: GOAL MET  2.  Pt will be able to apply multilayer, knee length, gradient, compression wraps to one leg at a time from toes to below knee with max caregiver assist to decrease limb volume, to limit infection risk, and to limit lymphedema progression.  Baseline: Dependent Goal status: GOAL MET  LONG TERM GOALS: Target date: 09/19/23  1.Given this patient's Intake score of TBA % on the Lymphedema Life Impact Scale (LLIS), patient will  experience a reduction of at least 5 points in her perceived level of functional impairment resulting from lymphedema to improve functional performance and quality of life (QOL). Baseline: TBA % Goal status:  GOAL DEFERRED  2.  Pt will achieve at least a 10% volume reduction in B legs to return limb to typical size and shape, to limit infection risk and LE progression, to decrease pain, to improve function. Baseline: Dependent Goal status:GOAL MET R LEG volume reduced by 14.7%, and L LEG reduced by 10.7% since initially measured on 07/18/21  3.  Pt will obtain appropriate compression garments/devices and achieve modified independence (extra time + assistive devices) with donning/doffing to optimize limb volume reductions and limit LE progression over time. Baseline: Dependent Goal status: GOAL PARTIALLY MET. Pt obtained BLE, Velcro wrap style, knee length, CircAid elastic  garment alternatives , which are replacing multilayer compression wraps. Pt requires moderate assistance to don and gauge these garment alternatives at most distal straps where she cannot reach them. Pt plans to take garments to an alterations shop o have webbing loops added to each strap so she can reach them easier.She is independent with doffing them.   4.During Intensive phase CDT, with max CG assistance, Pt will achieve at least 85% compliance with all adapted lymphedema self-care home program components, including daily skin care, compression wraps and /or garments, simple self MLD and lymphatic pumping therex to habituate LE self care protocol  into ADLs for optimal LE self-management over time. Baseline: Dependent Goal status: GOAL MET  PLAN:  OT FREQUENCY: reduce OT frequency 1 x weekly for 4 weeks to support transition to LE self-care  OT DURATION: 12 weeks and PRN  PLANNED INTERVENTIONS:compression bandaging, skin care,  97110-Therapeutic exercises, 97530- Therapeutic activity, 97535- Self Care, Manual lymph  drainage, DME instructions, and fit with appropriate compression  PLAN FOR NEXT SESSION:  Manual therapy to LLE as established including MLD, skin care and compression Fit CircAid to LLE. Cont Pt edu for LE self-care   Zebedee Dec, MS, OTR/L, CLT-LANA 12/25/23  4:06 PM

## 2023-12-27 ENCOUNTER — Ambulatory Visit

## 2023-12-27 ENCOUNTER — Ambulatory Visit: Admitting: Occupational Therapy

## 2023-12-28 ENCOUNTER — Ambulatory Visit

## 2023-12-28 ENCOUNTER — Ambulatory Visit: Admitting: Occupational Therapy

## 2023-12-28 DIAGNOSIS — M5459 Other low back pain: Secondary | ICD-10-CM | POA: Diagnosis not present

## 2023-12-28 DIAGNOSIS — R262 Difficulty in walking, not elsewhere classified: Secondary | ICD-10-CM | POA: Diagnosis not present

## 2023-12-28 DIAGNOSIS — M6281 Muscle weakness (generalized): Secondary | ICD-10-CM

## 2023-12-28 DIAGNOSIS — R278 Other lack of coordination: Secondary | ICD-10-CM | POA: Diagnosis not present

## 2023-12-28 DIAGNOSIS — M25551 Pain in right hip: Secondary | ICD-10-CM | POA: Diagnosis not present

## 2023-12-28 DIAGNOSIS — I890 Lymphedema, not elsewhere classified: Secondary | ICD-10-CM | POA: Diagnosis not present

## 2023-12-28 NOTE — Therapy (Signed)
 OUTPATIENT PHYSICAL THERAPY THORACOLUMBAR TREATMENT   Patient Name: Judith Arnold MRN: [id number] DOB:[birth year], 73 y.o., female Today's Date: 12/29/2023  END OF SESSION:  PT End of Session - 12/29/23 2301     Visit Number 25    Number of Visits 44    Date for Recertification  03/04/24    Progress Note Due on Visit 30    PT Start Time 1402    PT Stop Time 1444    PT Time Calculation (min) 42 min    Equipment Utilized During Treatment Gait belt    Activity Tolerance Patient tolerated treatment well;Patient limited by pain    Behavior During Therapy WFL for tasks assessed/performed             Past Medical History:  Diagnosis Date   Allergy    Arthritis    lower back, left shoulder   Asthma    Carpal tunnel syndrome on both sides    Dental bridge present    top   Dental crown present    multiple   Family history of adverse reaction to anesthesia    Father and sister - PONV   GERD (gastroesophageal reflux disease)    Glaucoma    Hypertension    Leaky heart valve    Pre-diabetes    Sleep apnea    Past Surgical History:  Procedure Laterality Date   CATARACT EXTRACTION W/PHACO Right 11/16/2016   Procedure: CATARACT EXTRACTION PHACO AND INTRAOCULAR LENS PLACEMENT (IOC)  right;  Surgeon: Mittie Gaskin, MD;  Location: [facility];  Service: Ophthalmology;  Laterality: Right;  pre diabetic latex sensitivity sleep apnea   CATARACT EXTRACTION W/PHACO Left 01/18/2017   Procedure: CATARACT EXTRACTION PHACO AND INTRAOCULAR LENS PLACEMENT (IOC) LEFT DIABETIC;  Surgeon: Mittie Gaskin, MD;  Location: [facility];  Service: Ophthalmology;  Laterality: Left;   COLONOSCOPY     COLONOSCOPY WITH PROPOFOL  N/A 04/09/2018   Procedure: COLONOSCOPY WITH PROPOFOL ;  Surgeon: Viktoria Lamar DASEN, MD;  Location: [facility];  Service: Endoscopy;  Laterality: N/A;   IRIDOTOMY / IRIDECTOMY Bilateral 2005   TOOTH EXTRACTION     Patient Active Problem List    Diagnosis Date Noted   Chronic bilateral low back pain with right-sided sciatica 09/29/2023   Hypertension associated with diabetes (HCC) 01/20/2023   Combined hyperlipidemia associated with type 2 diabetes mellitus (HCC) 01/20/2023   Intertrigo 11/01/2022   Stasis dermatitis of both legs 10/21/2022   Leg swelling 10/21/2022   Lymphedema of lower extremity 10/21/2022   Type 2 diabetes mellitus without complication, without long-term current use of insulin (HCC) 05/24/2022   Mixed hyperlipidemia 05/24/2022   Essential hypertension, benign 05/24/2022   Absolute anemia 05/24/2022   Gastroesophageal reflux disease without esophagitis 05/24/2022   Allergic rhinitis 05/24/2022   Asthma, allergic, moderate persistent, uncomplicated 05/24/2022   Bilateral chronic angle-closure glaucoma, indeterminate stage 05/24/2022   Airway hyperreactivity 08/29/2013   OSA on CPAP 08/29/2013    PCP: Fernand Fredy RAMAN, MD  REFERRING PROVIDER: Fernand Fredy, RAMAN, MD  REFERRING DIAG:  Diagnosis  G89.29,M54.41 (ICD-10-CM) - Chronic bilateral low back pain with right-sided sciatica    Rationale for Evaluation and Treatment: Rehabilitation  THERAPY DIAG:  Other low back pain  Difficulty in walking, not elsewhere classified  Pain in right hip  Muscle weakness (generalized)  Other lack of coordination  ONSET DATE: >3 years   SUBJECTIVE:  SUBJECTIVE STATEMENT:  Patient Reports she fell in community store. States the walker tipped and collapsed in on her causing her to fall to her right side. She stated she was assisted back to standing without any injury and did not seek emergent care. States unsure if she wants to continue to use the upright walker due to fear of falling.  From EVAL: Pt is a pleasant 73 y/o female presenting to PT eval for  chronic LBP with R side hip pain. She reports onset of LBP over 3 years ago. Primary site of pain is mostly felt in R hip and lower spine. Pt reports hx of arthritis in lower back and R hip. She is now starting to feel arthritis pain in her knees as well. Reports RLE is actually her good leg, has some issues with pain into groin region of LLE. Pt states strength in legs has been impacted and that her general mobility is worse. Pt can only ambulate comfortably by taking weight off of lower back by using her 4WW. She states not using 4WW so much for balance, but for reducing pain with gait. She is unable to stand up fully due to pain. Pt says she used to be very fit (took take karate, gymnastics years ago). She is also seeing OT for BLE lymphedema management. She endorses difficulty getting out of chairs, difficulty with steps, she must use a ramp to get in and out of her home. Pt reports my brain does not retain what I'm talking about, can lose train of thought.  PERTINENT HISTORY:  PMH per chart includes arthritis low back and left shoulder, bilateral carpal tunnel, asthma, glaucoma, HTN, leaky heart valve, sleep apnea, pre-diabetes, being seen by lymphedema specialist for BLE lymphedema   PAIN:  Are you having pain? LBP and R hip pain  Lowest pain level: 0/10  With standing: 2/10  Worst: 5-6/10   PRECAUTIONS: LATEX ALLERGY per chart, fall  RED FLAGS: None  WEIGHT BEARING RESTRICTIONS: No  FALLS:  Has patient fallen in last 6 months? No  LIVING ENVIRONMENT: Uses ramp to get into home due to difficulty with steps, has 4WW  PLOF: Independent  PATIENT GOALS:  Says due to back pain she is unable to reach my feet, has difficulty getting her socks on without a grabber, wants to be more flexible and stronger to increase ease with these activities and mobility    OBJECTIVE:  Note: Objective measures were completed at Evaluation unless otherwise noted.  DIAGNOSTIC FINDINGS:  No recent  pertinent imaging available in chart  PATIENT SURVEYS:  Modified Oswestry score: 34%  Interpretation of scores: Score Category Description  0-20% Minimal Disability The patient can cope with most living activities. Usually no treatment is indicated apart from advice on lifting, sitting and exercise  21-40% Moderate Disability The patient experiences more pain and difficulty with sitting, lifting and standing. Travel and social life are more difficult and they may be disabled from work. Personal care, sexual activity and sleeping are not grossly affected, and the patient can usually be managed by conservative means  41-60% Severe Disability Pain remains the main problem in this group, but activities of daily living are affected. These patients require a detailed investigation  61-80% Crippled Back pain impinges on all aspects of the patient's life. Positive intervention is required  81-100% Bed-bound  These patients are either bed-bound or exaggerating their symptoms  Bluford BRAVO, [place] DELENA Karon DELENA, et al. Surgery versus conservative management of stable thoracolumbar fracture:  the PRESTO feasibility RCT. [place] [employer]; 2021 Nov. [facility] Technology Assessment, No. 25.62.) Appendix 3, Oswestry Disability Index category descriptors. Available from: FindJewelers.cz  Minimally Clinically Important Difference (MCID) = 12.8%  COGNITION: Overall cognitive status: Within functional limits for tasks assessed, very pleasant pt can become tangential and requires redirection to activity    SENSATION: Intact to light touch BLE   MUSCLE LENGTH: deferred  POSTURE: increased thoracic kyphosis, rounded shoulders, unable to achieve full upright position due to pain, maintains increased hip and knee flexion when attempting to stand fully upright   PALPATION: deferred  Thoracolumbar AROM: Extension - unable to achieve neutral position, remains in  significant flexion  Flexion - lacking at least 25%  Rotation - lacking at least 40% bilat  and pain-limited    LOWER EXTREMITY MMT:    MMT Right eval Left eval  Hip flexion 4- 3*  Hip extension    Hip abduction 4+ 4+  Hip adduction 4+ 4+  Hip internal rotation    Hip external rotation    Knee flexion 4 3*  Knee extension 4+ 4+  Ankle dorsiflexion 4 4+  Ankle plantarflexion    Ankle inversion    Ankle eversion     (Blank rows = not tested) *=pain limited   LUMBAR SPECIAL TESTS:  deferred  FUNCTIONAL TESTS:  10 meter walk test: 0.63 m/s crouched posture, decreased hip extension, decreased step-length and heel strike, elevated R>L shoulder, heavy BUE weightbearing on 4WW  GAIT: Distance walked: 10MWT/clinic distances (see above) Assistive device utilized: Environmental consultant - 4 wheeled Level of assistance: Modified independence Comments: gait mechanics impaired: decreased gait speed, crouched posture, decreased hip extension, decreased step-length and heel strike, elevated R>L shoulder, heavy BUE weightbearing on 4WW  10 Meter Walk Test: Patient instructed to walk 10 meters (32.8 ft) as quickly and as safely as possible at their normal speed x2 and at a fast speed x2. Time measured from 2 meter mark to 8 meter mark to accommodate ramp-up and ramp-down.  Normal speed 1: 0.66 m/s with rollator Normal speed 2: 0.66 m/s with rollator Normal speed 3: 0.68 m/s with upright 4WW Normal speed 4: 0.68 m/s with upright 4WW Average Normal speed: 0.67 m/s  Cut off scores: <0.4 m/s = household Ambulator, 0.4-0.8 m/s = limited community Ambulator, >0.8 m/s = community Ambulator, >1.2 m/s = crossing a street, <1.0 = increased fall risk MCID 0.05 m/s (small), 0.13 m/s (moderate), 0.06 m/s (significant)  (ANPTA Core Set of Outcome Measures for Adults with Neurologic Conditions, 2018)   MODIFIED OSWESTRY DISABILITY SCALE  Date: 12/11/2023 Score  Pain intensity 3 =  Pain medication provides me with  moderate relief from pain.  2. Personal care (washing, dressing, etc.) 1 =  I can take care of myself normally, but it increases my pain.  3. Lifting 1 = I can lift heavy weights, but it causes increased pain.  4. Walking 4 = I can only walk with crutches or a cane.  5. Sitting 1 =  I can only sit in my favorite chair as long as I like.  6. Standing 3 =  Pain prevents me from standing more than 1/2 hour.  7. Sleeping 1 = I can sleep well only by using pain medication.  8. Social Life 2 = Pain prevents me from participating in more energetic activities (eg. sports, dancing).  9. Traveling 1 =  I can travel anywhere, but it increases my pain.  10. Employment/ Homemaking 2 =  I can perform most of my homemaking/job duties, but pain prevents me from performing more physically stressful activities (eg, lifting, vacuuming).  Total 19/50 = 38%   Interpretation of scores: Score Category Description  0-20% Minimal Disability The patient can cope with most living activities. Usually no treatment is indicated apart from advice on lifting, sitting and exercise  21-40% Moderate Disability The patient experiences more pain and difficulty with sitting, lifting and standing. Travel and social life are more difficult and they may be disabled from work. Personal care, sexual activity and sleeping are not grossly affected, and the patient can usually be managed by conservative means  41-60% Severe Disability Pain remains the main problem in this group, but activities of daily living are affected. These patients require a detailed investigation  61-80% Crippled Back pain impinges on all aspects of the patient's life. Positive intervention is required  81-100% Bed-bound  These patients are either bed-bound or exaggerating their symptoms  Bluford FORBES Zoe DELENA Karon DELENA, et al. Surgery versus conservative management of stable thoracolumbar fracture: the PRESTO feasibility RCT. [place] ([place]): [employer];  2021 Nov. [facility] Technology Assessment, No. 25.62.) Appendix 3, Oswestry Disability Index category descriptors. Available from: FindJewelers.cz  Minimally Clinically Important Difference (MCID) = 12.8%   Pt performed 5 time sit<>stand (5xSTS): 21.78 sec without UE support sec (>15 sec indicates increased fall risk)      TREATMENT DATE: 12/29/23    *Patient brought in Upright walker that she most recently purchased.   TA:  Reviewed assembly of upright walker -no obvious flaws in assembly and mentioned to make sure seat is completed down to avoid any possible collapse. Spent time inspected wheels and body of walker. Tried quick turns and heavy 1 sided support to see if walker would tip or collapse in.    Patient ambulated 4 rounds of walking with upright walker - measuring 150 feet and patient perform- focusing on overall endurance and posture. Instructed patient that the upright walker is the best as far as walking with good UE support and posture.       PATIENT EDUCATION:  Education details: Provided education on findings of exam, indications for plan, prognoses, goals, how PT can help                 Person educated: Patient Education method: Explanation Education comprehension: verbalized understanding  HOME EXERCISE PROGRAM: Access Code: [email or web address] URL: [email or web address] Date: 12/28/2023 Prepared by: Reyes London  Exercises - Seated Shoulder Pendulum Exercise  - 1 x daily - 3 sets - 10 reps - Seated Shoulder Flexion Towel Slide at Table Top  - 1 x daily - 3 sets - 10 reps - Seated Shoulder Flexion  - 1 x daily - 3 sets - 10 reps     Access Code: [email or web address] URL: [email or web address] Date: 10/05/2023 Prepared by: Lonni Gainer  Exercises - Supine Bridge  - 3 x weekly - 3 sets - 10 reps - Clamshell  - 3 x weekly - 3 sets - 10 reps - Supine Lower Trunk Rotation  - 1 x daily - 3 sets - 30 sec hold -  Seated Hamstring Stretch  - 1 x daily - 3 sets - 30 hold - Seated Clamshell  - 1 x daily - 7 x weekly - 2 sets - 20 reps - 3 sec hold - Supine Transversus Abdominis Bracing - Hands on Stomach  - 3 x weekly - 3 sets - 10 reps - Supine Gluteal Sets  - 3  x weekly - 3 sets - 10 reps  ASSESSMENT:  CLINICAL IMPRESSION: Patient presents fear with walking with new upright 4WW secondary to fall earlier this week at store. She demonstrated good safety and good overall posture with use of upright 4WW. Instructed her from a safety and overall performance that the upright 4WW was the best device to utilize. Pt will continue to benefit from skilled therapy to address remaining deficits in order to improve overall QoL and return to PLOF.      OBJECTIVE IMPAIRMENTS: Abnormal gait, decreased activity tolerance, decreased balance, decreased mobility, difficulty walking, decreased strength, hypomobility, increased edema, improper body mechanics, postural dysfunction, and pain.   ACTIVITY LIMITATIONS: carrying, lifting, bending, squatting, stairs, transfers, bed mobility, and locomotion level  PARTICIPATION LIMITATIONS: meal prep, cleaning, shopping, community activity, and yard work  PERSONAL FACTORS: Age, Fitness, Sex, Time since onset of injury/illness/exacerbation, and 3+ comorbidities: PMH per chart includes arthritis low back and left shoulder, bilateral carpal tunnel, asthma, glaucoma, HTN, leaky heart valve, sleep apnea, pre-diabetes, being seen by lymphedema specialist for BLE lymphedema  are also affecting patient's functional outcome.   REHAB POTENTIAL: Good  CLINICAL DECISION MAKING: Evolving/moderate complexity  EVALUATION COMPLEXITY: Moderate   GOALS: Goals reviewed with patient? Yes   SHORT TERM GOALS: Target date: 10/31/2023    Patient will be independent in home exercise program to improve strength/mobility for better functional independence with ADLs. Baseline:to be initiated;  11/06/2023=Patient reports knowledgeable of HEP but not always able to perform and sometimes painful so has to modify.  Goal status: MET   LONG TERM GOALS: Target date: 03/04/2024    Patient will reduce modified Oswestry score to <20 as to demonstrate minimal disability with ADLs including improved sleeping tolerance, walking/sitting tolerance etc for better mobility with ADLs.  Baseline: 34; 11/06/2023= 32; 12/11/2023= 38% Goal status: ONGOING  2.  Patient (> 16 years old) will complete five times sit to stand test in < 15 seconds indicating an increased LE strength and improved balance. Baseline: 09/26/2023= 26.46 sec with heavy UE Support;  11/06/2023= 22.31 sec without UE Support; 12/11/2023 =21.78 sec without UE support Goal status: PROGRESSING  3.  Patient will increase Berg Balance score by > 6 points to demonstrate decreased fall risk during functional activities Baseline: 09/26/2023= 31/56; 11/06/2023= 40/56 Goal status: MET  4.  Patient will increase 10 meter walk test to >1.19m/s as to improve gait speed for better community ambulation and to reduce fall risk. Baseline:  0.63 m/s with 4WW; 0.74 m/s; 12/11/2203= 0.66 m/s with 4WW and 0.68 m/s with upright 4WW Goal status: PROGRESSING  5. Patient will transition from skilled PT services to independent with gym based program for long term plan for fitness and management of LE weakness/low back pain.   Baseline: Patient current engaged with outpatient PT services to assist with strength and low back pain management.  Goal status: NEW      PLAN:  PT FREQUENCY: 1-2x/week  PT DURATION: 12 weeks  PLANNED INTERVENTIONS: 97164- PT Re-evaluation, 97750- Physical Performance Testing, 97110-Therapeutic exercises, 97530- Therapeutic activity, [id number]- Neuromuscular re-education, 97535- Self Care, [id number]- Manual therapy, [id number]- Gait training, [phone]- Orthotic Initial, [phone]- Orthotic/Prosthetic subsequent, [phone]- Canalith repositioning,  Patient/Family education, Balance training, Stair training, Taping, Joint mobilization, Spinal mobilization, Vestibular training, DME instructions, Cryotherapy, and Moist heat.  PLAN FOR NEXT SESSION:   Well zone for LE strengthening and mobility training. Continue to progress functional mobility in standing and seated if needed  Continue with some static and dynamic  balance activities as appropriate. Manual therapy for lumbar ROM/pain Continue to progress HEP to activities that promote mobility with the least amount of pain as possible.   Chyrl London, PT Physical Therapist - [facility]  12/29/23, 11:02 PM

## 2024-01-01 ENCOUNTER — Ambulatory Visit

## 2024-01-01 ENCOUNTER — Ambulatory Visit: Admitting: Internal Medicine

## 2024-01-01 ENCOUNTER — Ambulatory Visit: Admitting: Occupational Therapy

## 2024-01-01 DIAGNOSIS — M6281 Muscle weakness (generalized): Secondary | ICD-10-CM | POA: Diagnosis not present

## 2024-01-01 DIAGNOSIS — M5459 Other low back pain: Secondary | ICD-10-CM

## 2024-01-01 DIAGNOSIS — M25551 Pain in right hip: Secondary | ICD-10-CM | POA: Diagnosis not present

## 2024-01-01 DIAGNOSIS — R262 Difficulty in walking, not elsewhere classified: Secondary | ICD-10-CM | POA: Diagnosis not present

## 2024-01-01 DIAGNOSIS — R278 Other lack of coordination: Secondary | ICD-10-CM

## 2024-01-01 DIAGNOSIS — I890 Lymphedema, not elsewhere classified: Secondary | ICD-10-CM

## 2024-01-01 NOTE — Therapy (Signed)
 OUTPATIENT PHYSICAL THERAPY THORACOLUMBAR TREATMENT   Patient Name: Judith Arnold MRN: [id number] DOB:[birth year], 73 y.o., female Today's Date: 01/01/2024  END OF SESSION:  PT End of Session - 01/01/24 1316     Visit Number 26    Number of Visits 44    Date for Recertification  03/04/24    Progress Note Due on Visit 30    PT Start Time 1315    PT Stop Time 1358    PT Time Calculation (min) 43 min    Equipment Utilized During Treatment Gait belt    Activity Tolerance Patient tolerated treatment well;Patient limited by pain    Behavior During Therapy WFL for tasks assessed/performed              Past Medical History:  Diagnosis Date   Allergy    Arthritis    lower back, left shoulder   Asthma    Carpal tunnel syndrome on both sides    Dental bridge present    top   Dental crown present    multiple   Family history of adverse reaction to anesthesia    Father and sister - PONV   GERD (gastroesophageal reflux disease)    Glaucoma    Hypertension    Leaky heart valve    Pre-diabetes    Sleep apnea    Past Surgical History:  Procedure Laterality Date   CATARACT EXTRACTION W/PHACO Right 11/16/2016   Procedure: CATARACT EXTRACTION PHACO AND INTRAOCULAR LENS PLACEMENT (IOC)  right;  Surgeon: Mittie Gaskin, MD;  Location: [facility];  Service: Ophthalmology;  Laterality: Right;  pre diabetic latex sensitivity sleep apnea   CATARACT EXTRACTION W/PHACO Left 01/18/2017   Procedure: CATARACT EXTRACTION PHACO AND INTRAOCULAR LENS PLACEMENT (IOC) LEFT DIABETIC;  Surgeon: Mittie Gaskin, MD;  Location: [facility];  Service: Ophthalmology;  Laterality: Left;   COLONOSCOPY     COLONOSCOPY WITH PROPOFOL  N/A 04/09/2018   Procedure: COLONOSCOPY WITH PROPOFOL ;  Surgeon: Viktoria Lamar DASEN, MD;  Location: [facility];  Service: Endoscopy;  Laterality: N/A;   IRIDOTOMY / IRIDECTOMY Bilateral 2005   TOOTH EXTRACTION     Patient Active Problem List    Diagnosis Date Noted   Chronic bilateral low back pain with right-sided sciatica 09/29/2023   Hypertension associated with diabetes (HCC) 01/20/2023   Combined hyperlipidemia associated with type 2 diabetes mellitus (HCC) 01/20/2023   Intertrigo 11/01/2022   Stasis dermatitis of both legs 10/21/2022   Leg swelling 10/21/2022   Lymphedema of lower extremity 10/21/2022   Type 2 diabetes mellitus without complication, without long-term current use of insulin (HCC) 05/24/2022   Mixed hyperlipidemia 05/24/2022   Essential hypertension, benign 05/24/2022   Absolute anemia 05/24/2022   Gastroesophageal reflux disease without esophagitis 05/24/2022   Allergic rhinitis 05/24/2022   Asthma, allergic, moderate persistent, uncomplicated 05/24/2022   Bilateral chronic angle-closure glaucoma, indeterminate stage 05/24/2022   Airway hyperreactivity 08/29/2013   OSA on CPAP 08/29/2013    PCP: Fernand Fredy RAMAN, MD  REFERRING PROVIDER: Fernand Fredy, RAMAN, MD  REFERRING DIAG:  Diagnosis  G89.29,M54.41 (ICD-10-CM) - Chronic bilateral low back pain with right-sided sciatica    Rationale for Evaluation and Treatment: Rehabilitation  THERAPY DIAG:  Other low back pain  Difficulty in walking, not elsewhere classified  Pain in right hip  Muscle weakness (generalized)  Other lack of coordination  ONSET DATE: >3 years   SUBJECTIVE:  SUBJECTIVE STATEMENT:  Patient reports doing okay with no further falls. States had a nose bleed earlier today.      From EVAL: Pt is a pleasant 73 y/o female presenting to PT eval for chronic LBP with R side hip pain. She reports onset of LBP over 3 years ago. Primary site of pain is mostly felt in R hip and lower spine. Pt reports hx of arthritis in lower back and R hip. She is now starting to feel  arthritis pain in her knees as well. Reports RLE is actually her good leg, has some issues with pain into groin region of LLE. Pt states strength in legs has been impacted and that her general mobility is worse. Pt can only ambulate comfortably by taking weight off of lower back by using her 4WW. She states not using 4WW so much for balance, but for reducing pain with gait. She is unable to stand up fully due to pain. Pt says she used to be very fit (took take karate, gymnastics years ago). She is also seeing OT for BLE lymphedema management. She endorses difficulty getting out of chairs, difficulty with steps, she must use a ramp to get in and out of her home. Pt reports my brain does not retain what I'm talking about, can lose train of thought.  PERTINENT HISTORY:  PMH per chart includes arthritis low back and left shoulder, bilateral carpal tunnel, asthma, glaucoma, HTN, leaky heart valve, sleep apnea, pre-diabetes, being seen by lymphedema specialist for BLE lymphedema   PAIN:  Are you having pain? LBP and R hip pain  Lowest pain level: 0/10  With standing: 2/10  Worst: 5-6/10   PRECAUTIONS: LATEX ALLERGY per chart, fall  RED FLAGS: None  WEIGHT BEARING RESTRICTIONS: No  FALLS:  Has patient fallen in last 6 months? No  LIVING ENVIRONMENT: Uses ramp to get into home due to difficulty with steps, has 4WW  PLOF: Independent  PATIENT GOALS:  Says due to back pain she is unable to reach my feet, has difficulty getting her socks on without a grabber, wants to be more flexible and stronger to increase ease with these activities and mobility    OBJECTIVE:  Note: Objective measures were completed at Evaluation unless otherwise noted.  DIAGNOSTIC FINDINGS:  No recent pertinent imaging available in chart  PATIENT SURVEYS:  Modified Oswestry score: 34%  Interpretation of scores: Score Category Description  0-20% Minimal Disability The patient can cope with most living  activities. Usually no treatment is indicated apart from advice on lifting, sitting and exercise  21-40% Moderate Disability The patient experiences more pain and difficulty with sitting, lifting and standing. Travel and social life are more difficult and they may be disabled from work. Personal care, sexual activity and sleeping are not grossly affected, and the patient can usually be managed by conservative means  41-60% Severe Disability Pain remains the main problem in this group, but activities of daily living are affected. These patients require a detailed investigation  61-80% Crippled Back pain impinges on all aspects of the patient's life. Positive intervention is required  81-100% Bed-bound  These patients are either bed-bound or exaggerating their symptoms  Bluford FORBES Zoe DELENA Karon DELENA, et al. Surgery versus conservative management of stable thoracolumbar fracture: the PRESTO feasibility RCT. [place] [employer]; 2021 Nov. [facility] Technology Assessment, No. 25.62.) Appendix 3, Oswestry Disability Index category descriptors. Available from: FindJewelers.cz  Minimally Clinically Important Difference (MCID) = 12.8%  COGNITION: Overall cognitive status:  Within functional limits for tasks assessed, very pleasant pt can become tangential and requires redirection to activity    SENSATION: Intact to light touch BLE   MUSCLE LENGTH: deferred  POSTURE: increased thoracic kyphosis, rounded shoulders, unable to achieve full upright position due to pain, maintains increased hip and knee flexion when attempting to stand fully upright   PALPATION: deferred  Thoracolumbar AROM: Extension - unable to achieve neutral position, remains in significant flexion  Flexion - lacking at least 25%  Rotation - lacking at least 40% bilat  and pain-limited    LOWER EXTREMITY MMT:    MMT Right eval Left eval  Hip flexion 4- 3*  Hip extension    Hip  abduction 4+ 4+  Hip adduction 4+ 4+  Hip internal rotation    Hip external rotation    Knee flexion 4 3*  Knee extension 4+ 4+  Ankle dorsiflexion 4 4+  Ankle plantarflexion    Ankle inversion    Ankle eversion     (Blank rows = not tested) *=pain limited   LUMBAR SPECIAL TESTS:  deferred  FUNCTIONAL TESTS:  10 meter walk test: 0.63 m/s crouched posture, decreased hip extension, decreased step-length and heel strike, elevated R>L shoulder, heavy BUE weightbearing on 4WW  GAIT: Distance walked: 10MWT/clinic distances (see above) Assistive device utilized: Environmental consultant - 4 wheeled Level of assistance: Modified independence Comments: gait mechanics impaired: decreased gait speed, crouched posture, decreased hip extension, decreased step-length and heel strike, elevated R>L shoulder, heavy BUE weightbearing on 4WW  10 Meter Walk Test: Patient instructed to walk 10 meters (32.8 ft) as quickly and as safely as possible at their normal speed x2 and at a fast speed x2. Time measured from 2 meter mark to 8 meter mark to accommodate ramp-up and ramp-down.  Normal speed 1: 0.66 m/s with rollator Normal speed 2: 0.66 m/s with rollator Normal speed 3: 0.68 m/s with upright 4WW Normal speed 4: 0.68 m/s with upright 4WW Average Normal speed: 0.67 m/s  Cut off scores: <0.4 m/s = household Ambulator, 0.4-0.8 m/s = limited community Ambulator, >0.8 m/s = community Ambulator, >1.2 m/s = crossing a street, <1.0 = increased fall risk MCID 0.05 m/s (small), 0.13 m/s (moderate), 0.06 m/s (significant)  (ANPTA Core Set of Outcome Measures for Adults with Neurologic Conditions, 2018)   MODIFIED OSWESTRY DISABILITY SCALE  Date: 12/11/2023 Score  Pain intensity 3 =  Pain medication provides me with moderate relief from pain.  2. Personal care (washing, dressing, etc.) 1 =  I can take care of myself normally, but it increases my pain.  3. Lifting 1 = I can lift heavy weights, but it causes increased pain.   4. Walking 4 = I can only walk with crutches or a cane.  5. Sitting 1 =  I can only sit in my favorite chair as long as I like.  6. Standing 3 =  Pain prevents me from standing more than 1/2 hour.  7. Sleeping 1 = I can sleep well only by using pain medication.  8. Social Life 2 = Pain prevents me from participating in more energetic activities (eg. sports, dancing).  9. Traveling 1 =  I can travel anywhere, but it increases my pain.  10. Employment/ Homemaking 2 = I can perform most of my homemaking/job duties, but pain prevents me from performing more physically stressful activities (eg, lifting, vacuuming).  Total 19/50 = 38%   Interpretation of scores: Score Category Description  0-20% Minimal Disability The  patient can cope with most living activities. Usually no treatment is indicated apart from advice on lifting, sitting and exercise  21-40% Moderate Disability The patient experiences more pain and difficulty with sitting, lifting and standing. Travel and social life are more difficult and they may be disabled from work. Personal care, sexual activity and sleeping are not grossly affected, and the patient can usually be managed by conservative means  41-60% Severe Disability Pain remains the main problem in this group, but activities of daily living are affected. These patients require a detailed investigation  61-80% Crippled Back pain impinges on all aspects of the patient's life. Positive intervention is required  81-100% Bed-bound  These patients are either bed-bound or exaggerating their symptoms  Bluford FORBES Zoe DELENA Karon DELENA, et al. Surgery versus conservative management of stable thoracolumbar fracture: the PRESTO feasibility RCT. [place] ([place]): [employer]; 2021 Nov. [facility] Technology Assessment, No. 25.62.) Appendix 3, Oswestry Disability Index category descriptors. Available from: FindJewelers.cz  Minimally Clinically Important  Difference (MCID) = 12.8%   Pt performed 5 time sit<>stand (5xSTS): 21.78 sec without UE support sec (>15 sec indicates increased fall risk)      TREATMENT DATE: 01/01/24   Assessed BP prior to treatment- Seated BP= 108/51 mmHg Left   TE:  -Seated knee ext alt LE 3# AW- 2 x 10 reps. -Seated Hip flex alt LE 3#AW- 2 x 10 reps -Seated ham curl with GTB - 2x 10 reps  TA:   -Sit to stand from EOM without UE support x 10 reps  Resistive ambulation with 3# AW x 350 feet - short reciprocal steps  NMR:  - Horizontal abd 1# db 2 x 10 reps -Scapular retract  1# db 2 x 10 reps - Seated shoulder ER  1# db 2 x 10 reps    PATIENT EDUCATION:  Education details: Provided education on findings of exam, indications for plan, prognoses, goals, how PT can help                 Person educated: Patient Education method: Explanation Education comprehension: verbalized understanding  HOME EXERCISE PROGRAM: Access Code: [email or web address] URL: [email or web address] Date: 12/28/2023 Prepared by: Reyes London  Exercises - Seated Shoulder Pendulum Exercise  - 1 x daily - 3 sets - 10 reps - Seated Shoulder Flexion Towel Slide at Table Top  - 1 x daily - 3 sets - 10 reps - Seated Shoulder Flexion  - 1 x daily - 3 sets - 10 reps     Access Code: [email or web address] URL: [email or web address] Date: 10/05/2023 Prepared by: Lonni Gainer  Exercises - Supine Bridge  - 3 x weekly - 3 sets - 10 reps - Clamshell  - 3 x weekly - 3 sets - 10 reps - Supine Lower Trunk Rotation  - 1 x daily - 3 sets - 30 sec hold - Seated Hamstring Stretch  - 1 x daily - 3 sets - 30 hold - Seated Clamshell  - 1 x daily - 7 x weekly - 2 sets - 20 reps - 3 sec hold - Supine Transversus Abdominis Bracing - Hands on Stomach  - 3 x weekly - 3 sets - 10 reps - Supine Gluteal Sets  - 3 x weekly - 3 sets - 10 reps  ASSESSMENT:  CLINICAL IMPRESSION: Treatment focused on LE Strength, functional endurance  and posture with good results today. Patient was responsive to added resistive with LE strengthening and with postural awareness. She was able to negotiate 1# DB  without report of any increased pain. She ambulated safely with use of walker today without any LOB. Pt will continue to benefit from skilled therapy to address remaining deficits in order to improve overall QoL and return to PLOF.      OBJECTIVE IMPAIRMENTS: Abnormal gait, decreased activity tolerance, decreased balance, decreased mobility, difficulty walking, decreased strength, hypomobility, increased edema, improper body mechanics, postural dysfunction, and pain.   ACTIVITY LIMITATIONS: carrying, lifting, bending, squatting, stairs, transfers, bed mobility, and locomotion level  PARTICIPATION LIMITATIONS: meal prep, cleaning, shopping, community activity, and yard work  PERSONAL FACTORS: Age, Fitness, Sex, Time since onset of injury/illness/exacerbation, and 3+ comorbidities: PMH per chart includes arthritis low back and left shoulder, bilateral carpal tunnel, asthma, glaucoma, HTN, leaky heart valve, sleep apnea, pre-diabetes, being seen by lymphedema specialist for BLE lymphedema  are also affecting patient's functional outcome.   REHAB POTENTIAL: Good  CLINICAL DECISION MAKING: Evolving/moderate complexity  EVALUATION COMPLEXITY: Moderate   GOALS: Goals reviewed with patient? Yes   SHORT TERM GOALS: Target date: 10/31/2023    Patient will be independent in home exercise program to improve strength/mobility for better functional independence with ADLs. Baseline:to be initiated; 11/06/2023=Patient reports knowledgeable of HEP but not always able to perform and sometimes painful so has to modify.  Goal status: MET   LONG TERM GOALS: Target date: 03/04/2024    Patient will reduce modified Oswestry score to <20 as to demonstrate minimal disability with ADLs including improved sleeping tolerance, walking/sitting tolerance  etc for better mobility with ADLs.  Baseline: 34; 11/06/2023= 32; 12/11/2023= 38% Goal status: ONGOING  2.  Patient (> 7 years old) will complete five times sit to stand test in < 15 seconds indicating an increased LE strength and improved balance. Baseline: 09/26/2023= 26.46 sec with heavy UE Support;  11/06/2023= 22.31 sec without UE Support; 12/11/2023 =21.78 sec without UE support Goal status: PROGRESSING  3.  Patient will increase Berg Balance score by > 6 points to demonstrate decreased fall risk during functional activities Baseline: 09/26/2023= 31/56; 11/06/2023= 40/56 Goal status: MET  4.  Patient will increase 10 meter walk test to >1.43m/s as to improve gait speed for better community ambulation and to reduce fall risk. Baseline:  0.63 m/s with 4WW; 0.74 m/s; 12/11/2203= 0.66 m/s with 4WW and 0.68 m/s with upright 4WW Goal status: PROGRESSING  5. Patient will transition from skilled PT services to independent with gym based program for long term plan for fitness and management of LE weakness/low back pain.   Baseline: Patient current engaged with outpatient PT services to assist with strength and low back pain management.  Goal status: NEW      PLAN:  PT FREQUENCY: 1-2x/week  PT DURATION: 12 weeks  PLANNED INTERVENTIONS: 97164- PT Re-evaluation, 97750- Physical Performance Testing, 97110-Therapeutic exercises, 97530- Therapeutic activity, [id number]- Neuromuscular re-education, 97535- Self Care, [id number]- Manual therapy, [id number]- Gait training, [phone]- Orthotic Initial, [phone]- Orthotic/Prosthetic subsequent, [phone]- Canalith repositioning, Patient/Family education, Balance training, Stair training, Taping, Joint mobilization, Spinal mobilization, Vestibular training, DME instructions, Cryotherapy, and Moist heat.  PLAN FOR NEXT SESSION:   Well zone for LE strengthening and mobility training. Continue to progress functional mobility in standing and seated if needed  Continue with some  static and dynamic balance activities as appropriate. Manual therapy for lumbar ROM/pain Continue to progress HEP to activities that promote mobility with the least amount of pain as possible.   Chyrl London, PT Physical Therapist - [facility]  01/01/24, 2:11  PM

## 2024-01-01 NOTE — Therapy (Signed)
 OUTPATIENT OCCUPATIONAL THERAPY TREATMENT NOTE  BILATERAL LOWER EXTREMITY LYMPHEDEMA  Patient Name: Judith Arnold MRN: [id number] DOB:[birth year], 73 y.o., female Today's Date: 01/01/2024  REPORTING PERIOD:   END OF SESSION:   OT End of Session - 01/01/24 1554     Visit Number 43    Number of Visits 72    Date for Recertification  01/16/24    OT Start Time 0220    OT Stop Time 0320    OT Time Calculation (min) 60 min    Activity Tolerance Patient tolerated treatment well;No increased pain    Behavior During Therapy WFL for tasks assessed/performed           Past Medical History:  Diagnosis Date   Allergy    Arthritis    lower back, left shoulder   Asthma    Carpal tunnel syndrome on both sides    Dental bridge present    top   Dental crown present    multiple   Family history of adverse reaction to anesthesia    Father and sister - PONV   GERD (gastroesophageal reflux disease)    Glaucoma    Hypertension    Leaky heart valve    Pre-diabetes    Sleep apnea    Past Surgical History:  Procedure Laterality Date   CATARACT EXTRACTION W/PHACO Right 11/16/2016   Procedure: CATARACT EXTRACTION PHACO AND INTRAOCULAR LENS PLACEMENT (IOC)  right;  Surgeon: Mittie Gaskin, MD;  Location: [facility];  Service: Ophthalmology;  Laterality: Right;  pre diabetic latex sensitivity sleep apnea   CATARACT EXTRACTION W/PHACO Left 01/18/2017   Procedure: CATARACT EXTRACTION PHACO AND INTRAOCULAR LENS PLACEMENT (IOC) LEFT DIABETIC;  Surgeon: Mittie Gaskin, MD;  Location: [facility];  Service: Ophthalmology;  Laterality: Left;   COLONOSCOPY     COLONOSCOPY WITH PROPOFOL  N/A 04/09/2018   Procedure: COLONOSCOPY WITH PROPOFOL ;  Surgeon: Viktoria Lamar DASEN, MD;  Location: [facility];  Service: Endoscopy;  Laterality: N/A;   IRIDOTOMY / IRIDECTOMY Bilateral 2005   TOOTH EXTRACTION     Patient Active Problem List   Diagnosis Date Noted   Chronic  bilateral low back pain with right-sided sciatica 09/29/2023   Hypertension associated with diabetes (HCC) 01/20/2023   Combined hyperlipidemia associated with type 2 diabetes mellitus (HCC) 01/20/2023   Intertrigo 11/01/2022   Stasis dermatitis of both legs 10/21/2022   Leg swelling 10/21/2022   Lymphedema of lower extremity 10/21/2022   Type 2 diabetes mellitus without complication, without long-term current use of insulin (HCC) 05/24/2022   Mixed hyperlipidemia 05/24/2022   Essential hypertension, benign 05/24/2022   Absolute anemia 05/24/2022   Gastroesophageal reflux disease without esophagitis 05/24/2022   Allergic rhinitis 05/24/2022   Asthma, allergic, moderate persistent, uncomplicated 05/24/2022   Bilateral chronic angle-closure glaucoma, indeterminate stage 05/24/2022   Airway hyperreactivity 08/29/2013   OSA on CPAP 08/29/2013    PCP: Fredy GORMAN Bathe, MD  REFERRING PROVIDER: same  REFERRING DIAG: lymphedema I89.0  THERAPY DIAG:  Lymphedema, not elsewhere classified  Rationale for Evaluation and Treatment: Rehabilitation  ONSET DATE: Pt reports onset of L ankle / leg swelling after injury 20-30 yrs ago. Then about 1 year ago legs started swelling and Pt having prickly sensation on shins and thighs bilaterally.   SUBJECTIVE:  SUBJECTIVE STATEMENT: [name] presents to OT for treatment of BLE lymphedema 2/2 suspected CVI and obesity. Pt is unaccompanied today. Pt denies lymphedema related pain in her legs. Pt brings both Circaid compression stocking alternatives to clinic today.   (INITIAL EVAL 07/12/23 : [name] is referred to Occupational Therapy by Fredy GORMAN Bathe, MD,  for evaluation and treatment of BLE lymphedema.  Pt reports onset of L ankle and distal leg swelling  about 30 years ago, then noticed RLE swelling and worsening LLE swelling about 1 year ago at same time she started having unusual sensation in legs. Pt denies previously having lymphedema treatment. Pt reports paternal aunt had leg swelling. Pt is unable to wear compression stockings because she is unable to reach feet and distal legs to don and doff them due to back and hip pain.  PERTINENT HISTORY: Relevant to lymphedema (OSA (denies using CPAP), BLE stasis dermatitis, HTN, DM 2 (Pt states she is prediabetic only), B glaucoma, Back and R hip OA  PAIN:  Are you having LE-related pain? Yes: NPRS scale: not rated/10 Pain location: medial proximal thighs w abduction, bilateral groin, buttocks, back, legs Pain description: like stickers, tired, sore Aggravating factors: standing, walking, dependent sitting Relieving factors: elevation  PRECAUTIONS: Fall and Other: LYMPHEDEMA precautions  (DM 2 and asthma)  WEIGHT BEARING RESTRICTIONS: No  FALLS:  Has patient fallen in last 6 months? yes Fell asleep at the computer and fell onto the floor [place] when storm door hit me  LIVING ENVIRONMENT: Lives with: alone Lives in: House/apartment Stairs: No;  Has following equipment at home: Environmental consultant - 4 wheeled  OCCUPATION: retired Scientist, research (life sciences)  LEISURE: reading and taking notes on current events and politics  HAND DOMINANCE: right   PRIOR LEVEL OF FUNCTION: Independent with household mobility with device, Requires assistive device for independence, Needs assistance with ADLs, Needs assistance with homemaking, and Needs assistance with gait  PATIENT GOALS: reduce swelling and keep it from getting worse  OBJECTIVE: Note: Objective measures were completed at Evaluation unless otherwise noted.  COGNITION:  Overall cognitive status: impaired short term  memory   OBSERVATIONS / OTHER ASSESSMENTS: Mild, Stage II, BLE Lymphedema 2/2 suspected venous insufficiency and hx of obesity    SENSATION: Reports uncomfortable scratching, or sticker-like  sensation on anterior thighs   POSTURE: Raised walker handles 2 notches to increase upright spinal alignment when walking to limit falls risk. Handles need to be raised at least 1 hole more for safe upright posture when walking.  Upright sitting posture: head forward , shoulders forward and rounded- appears to be flexible kyphosis  LE ROM: WFL for ankles and knees. Pt reports limited hip abduction  LE MMT: [facility] LYMP[facility]EMA [facility]. Presenting with generalized weakness and debility  LYMPHEDEMA ASSESSMENTS:   SURGERY TYPE/DATE: Non-cancer related limb swelling  HX INFECTIONS: positive for 1 episode cellulitis treated w antibiotics  Hx WOUNDS: denies   BLE COMPARATIVE LIMB VOLUMETRICS: INITIAL 07/19/23  LANDMARK RIGHT (dominant)  R LEG (A-D) 3525.1 ml  R THIGH (E-G) ml  R FULL LIMB (A-G) ml  Limb Volume differential (LVD)  %  Volume change since initial %  Volume change overall V  (Blank rows = not tested)  LANDMARK LEFT    L LEG (A-D) 3769.6 ml  L THIGH (E-G) ml  L FULL LIMB (A-G) ml  Limb Volume differential (LVD)  Limb Volume Differential (LVD) measures 6.5%, L>R.  Volume change since initial %  Volume change overall %  RLE COMPARATIVE LIMB VOLUMETRICS: VISIT 9 08/15/23  LANDMARK RIGHT (dominant)  R LEG (A-D) 3549.4 ml  R THIGH (E-G) ml  R FULL LIMB (A-G) ml  Limb Volume differential (LVD)  %  Volume change since initial R LEG volume reduced by 5.84% since initially measured on 07/18/21  Volume change overall V  (Blank rows = not tested)  RLE COMPARATIVE LIMB VOLUMETRICS: VISIT 19 10/03/23  LANDMARK RIGHT (dominant)  R LEG (A-D) 3549.4 ml  R THIGH (E-G) ml  R FULL LIMB (A-G) ml  Limb Volume differential (LVD)  %  Volume change since initial R LEG volume reduced by 14.7% since initially measured on 07/18/21  Volume change overall V    BLE COMPARATIVE LIMB VOLUMETRICS: 29th Visit  LANDMARK RIGHT  (dominant)  R LEG (A-D) 3438.1 ml  R THIGH (E-G) ml  R FULL LIMB (A-G) ml  Limb Volume differential (LVD)  %  Volume change since last measured on 10/03/23 (19th visit) DECREASED by 0.67%. R Leg limb volume stable.  Volume change overall Overall R Leg volume reduction = 2.5%. Goal not me to date  (Blank rows = not tested)  LANDMARK LEFT    L LEG (A-D) 4174.4  ml  L THIGH (E-G) ml  L FULL LIMB (A-G) ml  Limb Volume differential (LVD)  .  Volume change since initial on 07/19/23 L LEG volume DECREASED by 10.7% since initially measured on 07/19/23  Volume change overall L LEG volume DECREASED by 10.7& since initially measured on 07/19/23  (Blank rows = not tested)  Mild, Stage  II, Bilateral Lower Extremity Lymphedema 2/2 CVI and Obesity  Skin  Description Hyper-Keratosis Peau' de Orange Shiny Tight Fibrotic/ Indurated Fatty Doughy Spongy/ boggy       R>L x  x   Skin dry Flaky WNL Macerated   mildly      Color Redness Varicosities Blanching Hemosiderin Stain Mottled   x     x   Odor Malodorous Yeast Fungal infection  WNL      x   Temperature Warm Cool wnl    x     Pitting Edema   1+ 2+ 3+ 4+ Non-pitting         x   Girth Symmetrical Asymmetrical                   Distribution    R>L toes to groin    Stemmer Sign Positive Negative   +    Lymphorrhea History Of:  Present Absent     x    Wounds History Of Present Absent Venous Arterial Pressure Sheer     x        Signs of Infection Redness Warmth Erythema Acute Swelling Drainage Borders                    Sensation Light Touch Deep pressure Hypersensitivity   Present Impaired Present Impaired Absent Impaired   x Tactile  x  x     Nails WNL   Fungus nail dystrophy   x     Hair Growth Symmetrical Asymmetrical   x    Skin Creases Base of toes  Ankles   Base of Fingers knees       Abdominal pannus Thigh Lobules  Face/neck   x x  x      (Blank rows = not tested)    LYMPHEDEMA LIFE IMPACT SCALE  (LLIS): TBA   TREATMENT THIS DATE:  Pt edu for  lymphedema self care LLE/LLQ MLD BLE knee length Circaid leggings with Max A  PATIENT EDUCATION:  Continued Pt/ CG edu for lymphedema self care home program throughout session. Topics include outcome of comparative limb volumetrics- starting limb volume differentials (LVDs), technology and gradient techniques used for short stretch, multilayer compression wrapping, simple self-MLD, therapeutic lymphatic pumping exercises, skin/nail care, LE precautions, compression garment recommendations and specifications, wear and care schedule and compression garment donning / doffing w assistive devices. Discussed progress towards all OT goals since commencing CDT. Discussed detrimental impact of obesity on lower and upper extremity lymphedema over time. Reviewed OT goals for lymphedema care with Pt and discussed progress to date.  All questions answered to the Pt's satisfaction. Good return. Person educated: Patient  Education method: Explanation, Demonstration, and Handouts Education comprehension: verbalized understanding, returned demonstration, verbal cues required, and needs further education   HOME EXERCISE PROGRAM: BLE lymphatic pumping there ex using- 1 sets of 10 reps, each exercise in order-  1-2 x daily, bilaterally Simple self MLD 1 x daily Daily skin care to increase hydration, skin mobility and decrease infection risk- can be done during MLD During Intensive Phase CDT: Compression wraps 23/7 until limb volume reduction complete During Self-management Phase CDT: Fit with appropriate compression garments or alternatives. Consider BLE, knee length, Mediven, custom, CircAid , Velcro style leggings over soft cotton liners.  ASSESSMENT: CLINICAL IMPRESSION:  Pt tolerated LLE/LLQ MLD well today without pain. Provided skin care to limit infection risk throughout MLD. Applied CircAid compression garment alternatives as established to bilateral legs at  end of session with min A. Pt continues to improve her ability to gauge these garments. Pt in agreement with reduction in OT frequency to 1 x weekly for the next 4 weeks as she transitions to self management of chronic, progressive lymphedema. Cont as per POC.  (INITIAL EVAL 07/12/23: Judith Arnold is a 73 yo female presenting with chronic, progressive, BLE swelling 2/2 unknown etiology, which at first glance appears to be circulatory in nature due to skin coloring at distal legs where swelling is most invested. Pt is presently unable to wear traditional elastic compression garments due to difficulty donning and doffing them.  Chronic, progressive lymphedema with associated skin changes, including fibrosis, limits this patent's functional performance in all occupational domains, including functional ambulation and mobility, basic and instrumental ADLs (lower body dressing, LB bathing, fitting street shoes and LB clothing, driving, shopping, and home management. It also limits perform productive activities, leisure pursuits, and participation in social and community activities. BLE lymphedema contributes to elevated infection risk. Pt will benefit from skilled OT for Complete Decongestive Therapy (CDT), which  typically includes manual lymphatic drainage (MLD), skin care to limit' infection risk and increase skin excursion, lymphatic pumping exercise, and during the Intensive Phase multilayer, gradient compression bandaging to reduce limb volume. Once limb volume is reduced as much as possible, Pt is fitted with appropriate compression garments and/ or devices and transitions into the self management phase of care consisting of follow long support PRN.    Pt understands that her fair prognosis will become poor without daily assistance with compression wrapping since she is unable to reach feet and legs to apply them herself. Pt assures OT that she has a friend she believes is willing to assist her  between OT sessions. )   OBJECTIVE IMPAIRMENTS: decreased standing and walking tolerance, activity tolerance, decreased balance, decreased knowledge of condition, decreased knowledge of use of DME, decreased mobility, decreased ROM, decreased strength,  increased edema, impaired flexibility, impaired sensation, impaired UE functional use, impaired vision/reception, pain, and chronic, progressive, BLE swelling and associated skin changes, at increased infection risk   ACTIVITY LIMITATIONS: Functional ambulation and mobility (lifting, carrying, steps/stairs, transfers, squatting) , Basic and instrumental ADLs, leisure pursuits, productive activities, social participation  PARTICIPATION LIMITATIONS: meal prep, cleaning, laundry, driving, shopping, yard work, and    PERSONAL FACTORS: Age, Fitness, Past/current experiences, Time since onset of injury/illness/exacerbation, 2+ co morbidities: OSA, HTN,  are also affecting patient's functional outcome.   REHAB POTENTIAL: Good  EVALUATION COMPLEXITY: Moderate   GOALS: Goals reviewed with patient? Yes  SHORT TERM GOALS: Target date: 4th OT Rx visit   Pt will demonstrate understanding of lymphedema precautions and prevention strategies with modified independence using a printed reference to identify at least 5 precautions and discussing how s/he may implement them into daily life to reduce risk of progression with extra time. Baseline:Max A Goal status: GOAL MET  2.  Pt will be able to apply multilayer, knee length, gradient, compression wraps to one leg at a time from toes to below knee with max caregiver assist to decrease limb volume, to limit infection risk, and to limit lymphedema progression.  Baseline: Dependent Goal status: GOAL MET  LONG TERM GOALS: Target date: 09/19/23  1.Given this patient's Intake score of TBA % on the Lymphedema Life Impact Scale (LLIS), patient will experience a reduction of at least 5 points in her perceived level of  functional impairment resulting from lymphedema to improve functional performance and quality of life (QOL). Baseline: TBA % Goal status:  GOAL DEFERRED  2.  Pt will achieve at least a 10% volume reduction in B legs to return limb to typical size and shape, to limit infection risk and LE progression, to decrease pain, to improve function. Baseline: Dependent Goal status:GOAL MET R LEG volume reduced by 14.7%, and L LEG reduced by 10.7% since initially measured on 07/18/21  3.  Pt will obtain appropriate compression garments/devices and achieve modified independence (extra time + assistive devices) with donning/doffing to optimize limb volume reductions and limit LE progression over time. Baseline: Dependent Goal status: GOAL PARTIALLY MET. Pt obtained BLE, Velcro wrap style, knee length, CircAid elastic  garment alternatives , which are replacing multilayer compression wraps. Pt requires moderate assistance to don and gauge these garment alternatives at most distal straps where she cannot reach them. Pt plans to take garments to an alterations shop o have webbing loops added to each strap so she can reach them easier.She is independent with doffing them.   4.During Intensive phase CDT, with max CG assistance, Pt will achieve at least 85% compliance with all adapted lymphedema self-care home program components, including daily skin care, compression wraps and /or garments, simple self MLD and lymphatic pumping therex to habituate LE self care protocol  into ADLs for optimal LE self-management over time. Baseline: Dependent Goal status: GOAL MET  PLAN:  OT FREQUENCY: reduce OT frequency 1 x weekly for 4 weeks to support transition to LE self-care  OT DURATION: 12 weeks and PRN  PLANNED INTERVENTIONS:compression bandaging, skin care,  97110-Therapeutic exercises, 97530- Therapeutic activity, 97535- Self Care, Manual lymph drainage, DME instructions, and fit with appropriate compression  PLAN FOR  NEXT SESSION:  Manual therapy to LLE as established including MLD, skin care and compression Fit CircAid to LLE. Cont Pt edu for LE self-care   Zebedee Dec, MS, OTR/L, CLT-LANA 01/01/24 3:55 PM

## 2024-01-02 ENCOUNTER — Encounter: Payer: Self-pay | Admitting: Internal Medicine

## 2024-01-02 ENCOUNTER — Ambulatory Visit: Admitting: Internal Medicine

## 2024-01-02 ENCOUNTER — Ambulatory Visit: Payer: Self-pay | Admitting: Internal Medicine

## 2024-01-02 VITALS — BP 128/64 | HR 90 | Ht 61.0 in | Wt 170.6 lb

## 2024-01-02 DIAGNOSIS — G4733 Obstructive sleep apnea (adult) (pediatric): Secondary | ICD-10-CM

## 2024-01-02 DIAGNOSIS — E1165 Type 2 diabetes mellitus with hyperglycemia: Secondary | ICD-10-CM

## 2024-01-02 DIAGNOSIS — G8929 Other chronic pain: Secondary | ICD-10-CM | POA: Diagnosis not present

## 2024-01-02 DIAGNOSIS — H402234 Chronic angle-closure glaucoma, bilateral, indeterminate stage: Secondary | ICD-10-CM

## 2024-01-02 DIAGNOSIS — M5441 Lumbago with sciatica, right side: Secondary | ICD-10-CM

## 2024-01-02 DIAGNOSIS — E782 Mixed hyperlipidemia: Secondary | ICD-10-CM | POA: Diagnosis not present

## 2024-01-02 DIAGNOSIS — E1169 Type 2 diabetes mellitus with other specified complication: Secondary | ICD-10-CM

## 2024-01-02 DIAGNOSIS — E1159 Type 2 diabetes mellitus with other circulatory complications: Secondary | ICD-10-CM | POA: Diagnosis not present

## 2024-01-02 DIAGNOSIS — K219 Gastro-esophageal reflux disease without esophagitis: Secondary | ICD-10-CM

## 2024-01-02 DIAGNOSIS — I152 Hypertension secondary to endocrine disorders: Secondary | ICD-10-CM

## 2024-01-02 DIAGNOSIS — J4540 Moderate persistent asthma, uncomplicated: Secondary | ICD-10-CM | POA: Diagnosis not present

## 2024-01-02 DIAGNOSIS — E119 Type 2 diabetes mellitus without complications: Secondary | ICD-10-CM

## 2024-01-02 DIAGNOSIS — I890 Lymphedema, not elsewhere classified: Secondary | ICD-10-CM | POA: Diagnosis not present

## 2024-01-02 DIAGNOSIS — Z23 Encounter for immunization: Secondary | ICD-10-CM | POA: Diagnosis not present

## 2024-01-02 LAB — POC CREATINE & ALBUMIN,URINE
Creatinine, POC: 300 mg/dL
Microalbumin Ur, POC: 80 mg/L

## 2024-01-02 LAB — POCT URINALYSIS DIPSTICK
Bilirubin, UA: NEGATIVE
Blood, UA: NEGATIVE
Glucose, UA: NEGATIVE
Leukocytes, UA: NEGATIVE
Nitrite, UA: NEGATIVE
Protein, UA: POSITIVE — AB
Spec Grav, UA: >=1.03 — AB (ref 1.010–1.025)
Urobilinogen, UA: 0.2 U/dL
pH, UA: 5 (ref 5.0–8.0)

## 2024-01-02 NOTE — Progress Notes (Signed)
 Established Patient Office Visit  Subjective:  Patient ID: Judith Arnold, female    DOB: [birth year]  Age: 73 y.o. MRN: [id number]  Chief Complaint  Patient presents with   Follow-up    3 month follow up, needs ckd    Patient is here today for follow up. She reports she is doing ok. She has complaints of chronic lower back pain effecting her ability to walk. She has a rollator walker today to help with gait instability. She sees physical therapy frequently and has reported improvement in her gait and strength since starting PT. Patient has no additional complaints and denies headache, chest pain, shortness of breath, abdominal pain at this time. There is improvement in her lower leg lymphedema and stasis dermatitis. Patient stopped her Valsartan  completely , and BP is staying down.  Patient reports she is fasting today and is due for blood work. So will collect labs today. Patient would like a flu shot today.     No other concerns at this time.   Past Medical History:  Diagnosis Date   Allergy    Arthritis    lower back, left shoulder   Asthma    Carpal tunnel syndrome on both sides    Dental bridge present    top   Dental crown present    multiple   Family history of adverse reaction to anesthesia    Father and sister - PONV   GERD (gastroesophageal reflux disease)    Glaucoma    Hypertension    Leaky heart valve    Pre-diabetes    Sleep apnea     Past Surgical History:  Procedure Laterality Date   CATARACT EXTRACTION W/PHACO Right 11/16/2016   Procedure: CATARACT EXTRACTION PHACO AND INTRAOCULAR LENS PLACEMENT (IOC)  right;  Surgeon: Mittie Gaskin, MD;  Location: [facility];  Service: Ophthalmology;  Laterality: Right;  pre diabetic latex sensitivity sleep apnea   CATARACT EXTRACTION W/PHACO Left 01/18/2017   Procedure: CATARACT EXTRACTION PHACO AND INTRAOCULAR LENS PLACEMENT (IOC) LEFT DIABETIC;  Surgeon: Mittie Gaskin, MD;  Location:  [facility];  Service: Ophthalmology;  Laterality: Left;   COLONOSCOPY     COLONOSCOPY WITH PROPOFOL  N/A 04/09/2018   Procedure: COLONOSCOPY WITH PROPOFOL ;  Surgeon: Viktoria Lamar DASEN, MD;  Location: [facility];  Service: Endoscopy;  Laterality: N/A;   IRIDOTOMY / IRIDECTOMY Bilateral 2005   TOOTH EXTRACTION      Social History   Socioeconomic History   Marital status: Divorced    Spouse name: Not on file   Number of children: Not on file   Years of education: Not on file   Highest education level: Not on file  Occupational History   Not on file  Tobacco Use   Smoking status: Former    Current packs/day: 0.00    Types: Cigarettes    Quit date: 74    Years since quitting: 37.8   Smokeless tobacco: Never  Vaping Use   Vaping status: Never Used  Substance and Sexual Activity   Alcohol use: Yes    Alcohol/week: 0.0 standard drinks of alcohol    Comment: 5x/yr   Drug use: No   Sexual activity: Not on file  Other Topics Concern   Not on file  Social History Narrative   Not on file   Social Drivers of Health   Financial Resource Strain: Low Risk  (12/05/2023)   Received from [facility]   Overall Financial Resource Strain (CARDIA)  Difficulty of Paying Living Expenses: Not hard at all  Food Insecurity: No Food Insecurity (12/05/2023)   [facility]   Hunger Vital Sign    Within the past 12 months, you worried that your food would run out before you got the money to buy more.: Never true    Within the past 12 months, the food you bought just didn't last and you didn't have money to get more.: Never true  Transportation Needs: No Transportation Needs (12/05/2023)   Received from [facility] - Transportation    In the past 12 months, has lack of transportation kept you from medical appointments or from getting medications?: No    Lack of Transportation (Non-Medical): No  Physical  Activity: Not on file  Stress: Not on file  Social Connections: Not on file  Intimate Partner Violence: Not on file    Family History  Problem Relation Age of Onset   Hypertension Mother    Diabetes Father    Breast cancer Neg Hx     Allergies  Allergen Reactions   Latex Other (See Comments)    Gets mouth rash from latex dental gloves. (Latex IgE - 08/25/16 - Negative)   Pseudoephedrine-Guaifenesin Other (See Comments)    Severe Headache   Other Rash    Walnuts cause mouth rash    Outpatient Medications Prior to Visit  Medication Sig   albuterol (VENTOLIN HFA) 108 (90 Base) MCG/ACT inhaler TAKE 2 PUFFS BY MOUTH 4 TIMES A DAY AS NEEDED (Patient taking differently: 2 puffs as needed.)   aspirin EC 81 MG tablet Take by mouth.   b complex vitamins capsule Take 1 capsule by mouth daily.   bimatoprost (LUMIGAN) 0.01 % SOLN Apply to eye.   FeFum-FePoly-FA-B Cmp-C-Biot (INTEGRA PLUS ) CAPS Take 1 each by mouth daily.   Fluticasone-Salmeterol (ADVAIR DISKUS) 250-50 MCG/DOSE AEPB Inhale into the lungs.   furosemide  (LASIX ) 20 MG tablet TAKE 1 TABLET BY MOUTH EVERY DAY   gabapentin  (NEURONTIN ) 300 MG capsule Take 1 capsule (300 mg total) by mouth at bedtime.   Ibuprofen (ADVIL) 200 MG CAPS Take by mouth. (Patient taking differently: Take 200 mg by mouth daily. 1 in the morning and 2 at lunch and 1 at night)   lansoprazole (PREVACID) 15 MG capsule TAKE 1 CAPSULE BY MOUTH TWICE DAILY 30 MINUTES BEFORE MEALS.   metFORMIN (GLUCOPHAGE-XR) 500 MG 24 hr tablet TAKE 1 TABLET BY MOUTH EVERY DAY   rosuvastatin (CRESTOR) 20 MG tablet TAKE 1 TABLET BY MOUTH EVERY DAY   SINGULAIR 10 MG tablet TAKE 1 TABLET BY MOUTH EVERY DAY   timolol  (BETIMOL ) 0.5 % ophthalmic solution Place 1 drop into both eyes 2 (two) times daily.   valsartan  (DIOVAN ) 40 MG tablet Take 0.5 tablets (20 mg total) by mouth daily. (Patient taking differently: Take 20 mg by mouth as needed.)   VASCEPA 1 g capsule TAKE 2 CAPSULES BY  MOUTH TWICE A DAY (Patient taking differently: Take 2 g by mouth 2 (two) times daily. 2 in the morning 1 at night)   Cetirizine HCl 10 MG CAPS Take by mouth. (Patient not taking: Reported on 01/02/2024)   Cholecalciferol (VITAMIN D3) 50000 units TABS Take by mouth once a week. (Patient not taking: Reported on 01/02/2024)   clotrimazole -betamethasone  (LOTRISONE ) cream APPLY TO AFFECTED AREA TWICE A DAY (Patient not taking: Reported on 01/02/2024)   Polyethyl Glycol-Propyl Glycol (SYSTANE OP) Apply to eye as needed. (Patient not taking:  Reported on 01/02/2024)   Triamcinolone  Acetonide (TRIAMCINOLONE  0.1 % CREAM : EUCERIN) CREA Apply 1 Application topically 2 (two) times daily. (Patient not taking: Reported on 01/02/2024)   No facility-administered medications prior to visit.    Review of Systems  Constitutional:  Positive for malaise/fatigue. Negative for chills and fever.  HENT: Negative.  Negative for congestion and sore throat.   Eyes: Negative.  Negative for blurred vision and pain.  Respiratory: Negative.  Negative for cough and shortness of breath.   Cardiovascular: Negative.  Negative for chest pain, palpitations and leg swelling.  Gastrointestinal: Negative.  Negative for abdominal pain, blood in stool, constipation, diarrhea, heartburn, melena, nausea and vomiting.  Genitourinary: Negative.  Negative for dysuria, flank pain, frequency and urgency.  Musculoskeletal:  Positive for back pain and joint pain. Negative for myalgias.  Skin: Negative.   Neurological: Negative.  Negative for dizziness, tingling, sensory change, weakness and headaches.  Endo/Heme/Allergies: Negative.   Psychiatric/Behavioral: Negative.  Negative for depression and suicidal ideas. The patient is not nervous/anxious.        Objective:   BP 128/64   Pulse 90   Ht 5' 1 (1.549 m)   Wt 170 lb 9.6 oz (77.4 kg)   SpO2 97%   BMI 32.23 kg/m   Vitals:   01/02/24 1405  BP: 128/64  Pulse: 90  Height: 5' 1  (1.549 m)  Weight: 170 lb 9.6 oz (77.4 kg)  SpO2: 97%  BMI (Calculated): 32.25    Physical Exam Vitals and nursing note reviewed.  Constitutional:      Appearance: Normal appearance.  HENT:     Head: Normocephalic and atraumatic.     Nose: Nose normal.     Mouth/Throat:     Mouth: Mucous membranes are moist.     Pharynx: Oropharynx is clear.  Eyes:     Conjunctiva/sclera: Conjunctivae normal.     Pupils: Pupils are equal, round, and reactive to light.  Cardiovascular:     Rate and Rhythm: Normal rate and regular rhythm.     Pulses: Normal pulses.     Heart sounds: Normal heart sounds. No murmur heard. Pulmonary:     Effort: Pulmonary effort is normal.     Breath sounds: Normal breath sounds. No wheezing.  Abdominal:     General: Bowel sounds are normal.     Palpations: Abdomen is soft.     Tenderness: There is no abdominal tenderness. There is no right CVA tenderness or left CVA tenderness.  Musculoskeletal:     Cervical back: Normal range of motion.     Thoracic back: Deformity present. Decreased range of motion.     Right lower leg: No edema.     Left lower leg: No edema.     Comments: Kyphosis thoracic spine  Lymphadenopathy:     Comments: BLE lymphedema  Skin:    General: Skin is warm and dry.  Neurological:     General: No focal deficit present.     Mental Status: She is alert and oriented to person, place, and time.  Psychiatric:        Mood and Affect: Mood normal.        Behavior: Behavior normal.      Results for orders placed or performed in visit on 01/02/24  POC CREATINE & ALBUMIN,URINE  Result Value Ref Range   Microalbumin Ur, POC 80 mg/L   Creatinine, POC 300 mg/dL   Albumin/Creatinine Ratio, Urine, POC 30-300     Recent Results (from  the past 2160 hours)  POC CREATINE & ALBUMIN,URINE     Status: Abnormal   Collection Time: 01/02/24  2:36 PM  Result Value Ref Range   Microalbumin Ur, POC 80 mg/L   Creatinine, POC 300 mg/dL    Albumin/Creatinine Ratio, Urine, POC 30-300       Assessment & Plan:  Check routine fasting blood work today and FU with patient on results. Continue taking medications as prescribed. Recommended continue PT to work on kyphosis and helping with lower back pain. Flu shot given. Problem List Items Addressed This Visit     OSA on CPAP   Type 2 diabetes mellitus without complication, without long-term current use of insulin (HCC) - Primary   Relevant Orders   POC CREATINE & ALBUMIN,URINE (Completed)   POCT CBG (Fasting - Glucose)   Hemoglobin A1c   Gastroesophageal reflux disease without esophagitis   Asthma, allergic, moderate persistent, uncomplicated   Bilateral chronic angle-closure glaucoma, indeterminate stage   Lymphedema of lower extremity   Hypertension associated with diabetes (HCC)   Relevant Orders   CBC with Diff   CMP14+EGFR   Combined hyperlipidemia associated with type 2 diabetes mellitus (HCC)   Relevant Orders   CBC with Diff   CMP14+EGFR   Lipid Panel w/o Chol/HDL Ratio   Chronic bilateral low back pain with right-sided sciatica   Needs flu shot    Return in about 3 months (around 04/03/2024).   Total time spent: 30 minutes. This time includes review of previous notes and results and patient face to face interaction during today's visit.    FERNAND FREDY RAMAN, MD  01/02/2024   This document may have been prepared by [facility] Voice Recognition software and as such may include unintentional dictation errors.

## 2024-01-03 ENCOUNTER — Ambulatory Visit: Admitting: Occupational Therapy

## 2024-01-03 ENCOUNTER — Ambulatory Visit

## 2024-01-03 DIAGNOSIS — M5459 Other low back pain: Secondary | ICD-10-CM | POA: Diagnosis not present

## 2024-01-03 DIAGNOSIS — R262 Difficulty in walking, not elsewhere classified: Secondary | ICD-10-CM | POA: Diagnosis not present

## 2024-01-03 DIAGNOSIS — R278 Other lack of coordination: Secondary | ICD-10-CM | POA: Diagnosis not present

## 2024-01-03 DIAGNOSIS — M25551 Pain in right hip: Secondary | ICD-10-CM | POA: Diagnosis not present

## 2024-01-03 DIAGNOSIS — M6281 Muscle weakness (generalized): Secondary | ICD-10-CM | POA: Diagnosis not present

## 2024-01-03 DIAGNOSIS — I890 Lymphedema, not elsewhere classified: Secondary | ICD-10-CM | POA: Diagnosis not present

## 2024-01-03 LAB — CBC WITH DIFFERENTIAL/PLATELET
Basophils Absolute: 0.1 x10E3/uL (ref 0.0–0.2)
Basos: 1 %
EOS (ABSOLUTE): 0.4 x10E3/uL (ref 0.0–0.4)
Eos: 7 %
Hematocrit: 34.8 % (ref 34.0–46.6)
Hemoglobin: 11.1 g/dL (ref 11.1–15.9)
Immature Grans (Abs): 0 x10E3/uL (ref 0.0–0.1)
Immature Granulocytes: 0 %
Lymphocytes Absolute: 2.1 x10E3/uL (ref 0.7–3.1)
Lymphs: 32 %
MCH: 28 pg (ref 26.6–33.0)
MCHC: 31.9 g/dL (ref 31.5–35.7)
MCV: 88 fL (ref 79–97)
Monocytes Absolute: 0.5 x10E3/uL (ref 0.1–0.9)
Monocytes: 7 %
Neutrophils Absolute: 3.5 x10E3/uL (ref 1.4–7.0)
Neutrophils: 53 %
Platelets: 283 x10E3/uL (ref 150–450)
RBC: 3.96 x10E6/uL (ref 3.77–5.28)
RDW: 13.6 % (ref 11.7–15.4)
WBC: 6.6 x10E3/uL (ref 3.4–10.8)

## 2024-01-03 LAB — CMP14+EGFR
ALT: 10 IU/L (ref 0–32)
AST: 18 IU/L (ref 0–40)
Albumin: 4.2 g/dL (ref 3.8–4.8)
Alkaline Phosphatase: 87 IU/L (ref 49–135)
BUN/Creatinine Ratio: 13 (ref 12–28)
BUN: 14 mg/dL (ref 8–27)
Bilirubin Total: 0.4 mg/dL (ref 0.0–1.2)
CO2: 25 mmol/L (ref 20–29)
Calcium: 9.2 mg/dL (ref 8.7–10.3)
Chloride: 103 mmol/L (ref 96–106)
Creatinine, Ser: 1.08 mg/dL — ABNORMAL HIGH (ref 0.57–1.00)
Globulin, Total: 2 g/dL (ref 1.5–4.5)
Glucose: 89 mg/dL (ref 70–99)
Potassium: 4 mmol/L (ref 3.5–5.2)
Sodium: 143 mmol/L (ref 134–144)
Total Protein: 6.2 g/dL (ref 6.0–8.5)
eGFR: 54 mL/min/1.73 — ABNORMAL LOW (ref >59)

## 2024-01-03 LAB — HEMOGLOBIN A1C
Est. average glucose Bld gHb Est-mCnc: 111 mg/dL
Hgb A1c MFr Bld: 5.5 % (ref 4.8–5.6)

## 2024-01-03 LAB — LIPID PANEL W/O CHOL/HDL RATIO
Cholesterol, Total: 149 mg/dL (ref 100–199)
HDL: 57 mg/dL (ref >39)
LDL Chol Calc (NIH): 74 mg/dL (ref 0–99)
Triglycerides: 99 mg/dL (ref 0–149)
VLDL Cholesterol Cal: 18 mg/dL (ref 5–40)

## 2024-01-03 NOTE — Progress Notes (Signed)
Pt informed

## 2024-01-03 NOTE — Therapy (Signed)
 OUTPATIENT PHYSICAL THERAPY THORACOLUMBAR TREATMENT   Patient Name: Judith Arnold MRN: [id number] DOB:[birth year], 73 y.o., female Today's Date: 01/03/2024  END OF SESSION:  PT End of Session - 01/03/24 1323     Visit Number 27    Number of Visits 44    Date for Recertification  03/04/24    Progress Note Due on Visit 30    PT Start Time 1320    Equipment Utilized During Treatment Gait belt    Activity Tolerance Patient tolerated treatment well;Patient limited by pain    Behavior During Therapy WFL for tasks assessed/performed              Past Medical History:  Diagnosis Date   Allergy    Arthritis    lower back, left shoulder   Asthma    Carpal tunnel syndrome on both sides    Dental bridge present    top   Dental crown present    multiple   Family history of adverse reaction to anesthesia    Father and sister - PONV   GERD (gastroesophageal reflux disease)    Glaucoma    Hypertension    Leaky heart valve    Pre-diabetes    Sleep apnea    Past Surgical History:  Procedure Laterality Date   CATARACT EXTRACTION W/PHACO Right 11/16/2016   Procedure: CATARACT EXTRACTION PHACO AND INTRAOCULAR LENS PLACEMENT (IOC)  right;  Surgeon: Mittie Gaskin, MD;  Location: [facility];  Service: Ophthalmology;  Laterality: Right;  pre diabetic latex sensitivity sleep apnea   CATARACT EXTRACTION W/PHACO Left 01/18/2017   Procedure: CATARACT EXTRACTION PHACO AND INTRAOCULAR LENS PLACEMENT (IOC) LEFT DIABETIC;  Surgeon: Mittie Gaskin, MD;  Location: [facility];  Service: Ophthalmology;  Laterality: Left;   COLONOSCOPY     COLONOSCOPY WITH PROPOFOL  N/A 04/09/2018   Procedure: COLONOSCOPY WITH PROPOFOL ;  Surgeon: Viktoria Lamar DASEN, MD;  Location: [facility];  Service: Endoscopy;  Laterality: N/A;   IRIDOTOMY / IRIDECTOMY Bilateral 2005   TOOTH EXTRACTION     Patient Active Problem List   Diagnosis Date Noted   Needs flu shot 01/02/2024    Chronic bilateral low back pain with right-sided sciatica 09/29/2023   Hypertension associated with diabetes (HCC) 01/20/2023   Combined hyperlipidemia associated with type 2 diabetes mellitus (HCC) 01/20/2023   Intertrigo 11/01/2022   Stasis dermatitis of both legs 10/21/2022   Leg swelling 10/21/2022   Lymphedema of lower extremity 10/21/2022   Type 2 diabetes mellitus without complication, without long-term current use of insulin (HCC) 05/24/2022   Mixed hyperlipidemia 05/24/2022   Essential hypertension, benign 05/24/2022   Absolute anemia 05/24/2022   Gastroesophageal reflux disease without esophagitis 05/24/2022   Allergic rhinitis 05/24/2022   Asthma, allergic, moderate persistent, uncomplicated 05/24/2022   Bilateral chronic angle-closure glaucoma, indeterminate stage 05/24/2022   Airway hyperreactivity 08/29/2013   OSA on CPAP 08/29/2013    PCP: Fernand Fredy RAMAN, MD  REFERRING PROVIDER: Fernand Fredy, RAMAN, MD  REFERRING DIAG:  Diagnosis  G89.29,M54.41 (ICD-10-CM) - Chronic bilateral low back pain with right-sided sciatica    Rationale for Evaluation and Treatment: Rehabilitation  THERAPY DIAG:  Other low back pain  Difficulty in walking, not elsewhere classified  Pain in right hip  Muscle weakness (generalized)  Other lack of coordination  ONSET DATE: >3 years   SUBJECTIVE:  SUBJECTIVE STATEMENT:  Patient reports not feeling well at all today- States received flu shot yesterday and having some right proximal thigh/hip pain and left shoulder pain from the flu shot.       From EVAL: Pt is a pleasant 73 y/o female presenting to PT eval for chronic LBP with R side hip pain. She reports onset of LBP over 3 years ago. Primary site of pain is mostly felt in R hip and lower spine. Pt reports hx of  arthritis in lower back and R hip. She is now starting to feel arthritis pain in her knees as well. Reports RLE is actually her good leg, has some issues with pain into groin region of LLE. Pt states strength in legs has been impacted and that her general mobility is worse. Pt can only ambulate comfortably by taking weight off of lower back by using her 4WW. She states not using 4WW so much for balance, but for reducing pain with gait. She is unable to stand up fully due to pain. Pt says she used to be very fit (took take karate, gymnastics years ago). She is also seeing OT for BLE lymphedema management. She endorses difficulty getting out of chairs, difficulty with steps, she must use a ramp to get in and out of her home. Pt reports my brain does not retain what I'm talking about, can lose train of thought.  PERTINENT HISTORY:  PMH per chart includes arthritis low back and left shoulder, bilateral carpal tunnel, asthma, glaucoma, HTN, leaky heart valve, sleep apnea, pre-diabetes, being seen by lymphedema specialist for BLE lymphedema   PAIN:  Are you having pain? LBP and R hip pain  Lowest pain level: 0/10  With standing: 2/10  Worst: 5-6/10   PRECAUTIONS: LATEX ALLERGY per chart, fall  RED FLAGS: None  WEIGHT BEARING RESTRICTIONS: No  FALLS:  Has patient fallen in last 6 months? No  LIVING ENVIRONMENT: Uses ramp to get into home due to difficulty with steps, has 4WW  PLOF: Independent  PATIENT GOALS:  Says due to back pain she is unable to reach my feet, has difficulty getting her socks on without a grabber, wants to be more flexible and stronger to increase ease with these activities and mobility    OBJECTIVE:  Note: Objective measures were completed at Evaluation unless otherwise noted.  DIAGNOSTIC FINDINGS:  No recent pertinent imaging available in chart  PATIENT SURVEYS:  Modified Oswestry score: 34%  Interpretation of scores: Score Category Description  0-20%  Minimal Disability The patient can cope with most living activities. Usually no treatment is indicated apart from advice on lifting, sitting and exercise  21-40% Moderate Disability The patient experiences more pain and difficulty with sitting, lifting and standing. Travel and social life are more difficult and they may be disabled from work. Personal care, sexual activity and sleeping are not grossly affected, and the patient can usually be managed by conservative means  41-60% Severe Disability Pain remains the main problem in this group, but activities of daily living are affected. These patients require a detailed investigation  61-80% Crippled Back pain impinges on all aspects of the patient's life. Positive intervention is required  81-100% Bed-bound  These patients are either bed-bound or exaggerating their symptoms  Bluford FORBES Zoe DELENA Karon DELENA, et al. Surgery versus conservative management of stable thoracolumbar fracture: the PRESTO feasibility RCT. [place]): [employer]; 2021 Nov. [facility] Technology Assessment, No. 25.62.) Appendix 3, Oswestry Disability Index category descriptors. Available from:  FindJewelers.cz  Minimally Clinically Important Difference (MCID) = 12.8%  COGNITION: Overall cognitive status: Within functional limits for tasks assessed, very pleasant pt can become tangential and requires redirection to activity    SENSATION: Intact to light touch BLE   MUSCLE LENGTH: deferred  POSTURE: increased thoracic kyphosis, rounded shoulders, unable to achieve full upright position due to pain, maintains increased hip and knee flexion when attempting to stand fully upright   PALPATION: deferred  Thoracolumbar AROM: Extension - unable to achieve neutral position, remains in significant flexion  Flexion - lacking at least 25%  Rotation - lacking at least 40% bilat  and pain-limited    LOWER EXTREMITY MMT:    MMT  Right eval Left eval  Hip flexion 4- 3*  Hip extension    Hip abduction 4+ 4+  Hip adduction 4+ 4+  Hip internal rotation    Hip external rotation    Knee flexion 4 3*  Knee extension 4+ 4+  Ankle dorsiflexion 4 4+  Ankle plantarflexion    Ankle inversion    Ankle eversion     (Blank rows = not tested) *=pain limited   LUMBAR SPECIAL TESTS:  deferred  FUNCTIONAL TESTS:  10 meter walk test: 0.63 m/s crouched posture, decreased hip extension, decreased step-length and heel strike, elevated R>L shoulder, heavy BUE weightbearing on 4WW  GAIT: Distance walked: 10MWT/clinic distances (see above) Assistive device utilized: Environmental consultant - 4 wheeled Level of assistance: Modified independence Comments: gait mechanics impaired: decreased gait speed, crouched posture, decreased hip extension, decreased step-length and heel strike, elevated R>L shoulder, heavy BUE weightbearing on 4WW  10 Meter Walk Test: Patient instructed to walk 10 meters (32.8 ft) as quickly and as safely as possible at their normal speed x2 and at a fast speed x2. Time measured from 2 meter mark to 8 meter mark to accommodate ramp-up and ramp-down.  Normal speed 1: 0.66 m/s with rollator Normal speed 2: 0.66 m/s with rollator Normal speed 3: 0.68 m/s with upright 4WW Normal speed 4: 0.68 m/s with upright 4WW Average Normal speed: 0.67 m/s  Cut off scores: <0.4 m/s = household Ambulator, 0.4-0.8 m/s = limited community Ambulator, >0.8 m/s = community Ambulator, >1.2 m/s = crossing a street, <1.0 = increased fall risk MCID 0.05 m/s (small), 0.13 m/s (moderate), 0.06 m/s (significant)  (ANPTA Core Set of Outcome Measures for Adults with Neurologic Conditions, 2018)   MODIFIED OSWESTRY DISABILITY SCALE  Date: 12/11/2023 Score  Pain intensity 3 =  Pain medication provides me with moderate relief from pain.  2. Personal care (washing, dressing, etc.) 1 =  I can take care of myself normally, but it increases my pain.  3.  Lifting 1 = I can lift heavy weights, but it causes increased pain.  4. Walking 4 = I can only walk with crutches or a cane.  5. Sitting 1 =  I can only sit in my favorite chair as long as I like.  6. Standing 3 =  Pain prevents me from standing more than 1/2 hour.  7. Sleeping 1 = I can sleep well only by using pain medication.  8. Social Life 2 = Pain prevents me from participating in more energetic activities (eg. sports, dancing).  9. Traveling 1 =  I can travel anywhere, but it increases my pain.  10. Employment/ Homemaking 2 = I can perform most of my homemaking/job duties, but pain prevents me from performing more physically stressful activities (eg, lifting, vacuuming).  Total 19/50 =  38%   Interpretation of scores: Score Category Description  0-20% Minimal Disability The patient can cope with most living activities. Usually no treatment is indicated apart from advice on lifting, sitting and exercise  21-40% Moderate Disability The patient experiences more pain and difficulty with sitting, lifting and standing. Travel and social life are more difficult and they may be disabled from work. Personal care, sexual activity and sleeping are not grossly affected, and the patient can usually be managed by conservative means  41-60% Severe Disability Pain remains the main problem in this group, but activities of daily living are affected. These patients require a detailed investigation  61-80% Crippled Back pain impinges on all aspects of the patient's life. Positive intervention is required  81-100% Bed-bound  These patients are either bed-bound or exaggerating their symptoms  Bluford FORBES Zoe DELENA Karon DELENA, et al. Surgery versus conservative management of stable thoracolumbar fracture: the PRESTO feasibility RCT. [place] ([place]): [employer]; 2021 Nov. [facility] Technology Assessment, No. 25.62.) Appendix 3, Oswestry Disability Index category descriptors. Available from:  FindJewelers.cz  Minimally Clinically Important Difference (MCID) = 12.8%   Pt performed 5 time sit<>stand (5xSTS): 21.78 sec without UE support sec (>15 sec indicates increased fall risk)      TREATMENT DATE: 01/03/24   Treatment modified according to Patient subjective complain of not feeling well.    TE:  -Seated Hip flex alt LE - 2 x 12 reps -Seated knee ext alt LE - 2 x 12 reps. -Seated hip abd alt LE (GTB) 2 x 12 reps -Seated hip abd (straight LE) 2 x 12 reps -Seated calf raises -2 x 12 reps -Seated ham curl with GTB - 2x 10 reps -Seated Hip add (ball squeeze) hold 5 sec x 10     PATIENT EDUCATION:  Education details: Provided education on findings of exam, indications for plan, prognoses, goals, how PT can help                 Person educated: Patient Education method: Explanation Education comprehension: verbalized understanding  HOME EXERCISE PROGRAM: Access Code: [email or web address] URL: [email or web address] Date: 12/28/2023 Prepared by: Reyes London  Exercises - Seated Shoulder Pendulum Exercise  - 1 x daily - 3 sets - 10 reps - Seated Shoulder Flexion Towel Slide at Table Top  - 1 x daily - 3 sets - 10 reps - Seated Shoulder Flexion  - 1 x daily - 3 sets - 10 reps     Access Code: [email or web address] URL: [email or web address] Date: 10/05/2023 Prepared by: Lonni Gainer  Exercises - Supine Bridge  - 3 x weekly - 3 sets - 10 reps - Clamshell  - 3 x weekly - 3 sets - 10 reps - Supine Lower Trunk Rotation  - 1 x daily - 3 sets - 30 sec hold - Seated Hamstring Stretch  - 1 x daily - 3 sets - 30 hold - Seated Clamshell  - 1 x daily - 7 x weekly - 2 sets - 20 reps - 3 sec hold - Supine Transversus Abdominis Bracing - Hands on Stomach  - 3 x weekly - 3 sets - 10 reps - Supine Gluteal Sets  - 3 x weekly - 3 sets - 10 reps  ASSESSMENT:  CLINICAL IMPRESSION: Treatment was modified/limited to some seated LE  strengthening due to not feeling well and struggling to walk into clinic today. She performed well with fatigue and some low back soreness as only issues. Pt will continue to benefit from skilled therapy  to address remaining deficits in order to improve overall QoL and return to PLOF.      OBJECTIVE IMPAIRMENTS: Abnormal gait, decreased activity tolerance, decreased balance, decreased mobility, difficulty walking, decreased strength, hypomobility, increased edema, improper body mechanics, postural dysfunction, and pain.   ACTIVITY LIMITATIONS: carrying, lifting, bending, squatting, stairs, transfers, bed mobility, and locomotion level  PARTICIPATION LIMITATIONS: meal prep, cleaning, shopping, community activity, and yard work  PERSONAL FACTORS: Age, Fitness, Sex, Time since onset of injury/illness/exacerbation, and 3+ comorbidities: PMH per chart includes arthritis low back and left shoulder, bilateral carpal tunnel, asthma, glaucoma, HTN, leaky heart valve, sleep apnea, pre-diabetes, being seen by lymphedema specialist for BLE lymphedema  are also affecting patient's functional outcome.   REHAB POTENTIAL: Good  CLINICAL DECISION MAKING: Evolving/moderate complexity  EVALUATION COMPLEXITY: Moderate   GOALS: Goals reviewed with patient? Yes   SHORT TERM GOALS: Target date: 10/31/2023    Patient will be independent in home exercise program to improve strength/mobility for better functional independence with ADLs. Baseline:to be initiated; 11/06/2023=Patient reports knowledgeable of HEP but not always able to perform and sometimes painful so has to modify.  Goal status: MET   LONG TERM GOALS: Target date: 03/04/2024    Patient will reduce modified Oswestry score to <20 as to demonstrate minimal disability with ADLs including improved sleeping tolerance, walking/sitting tolerance etc for better mobility with ADLs.  Baseline: 34; 11/06/2023= 32; 12/11/2023= 38% Goal status: ONGOING  2.   Patient (> 38 years old) will complete five times sit to stand test in < 15 seconds indicating an increased LE strength and improved balance. Baseline: 09/26/2023= 26.46 sec with heavy UE Support;  11/06/2023= 22.31 sec without UE Support; 12/11/2023 =21.78 sec without UE support Goal status: PROGRESSING  3.  Patient will increase Berg Balance score by > 6 points to demonstrate decreased fall risk during functional activities Baseline: 09/26/2023= 31/56; 11/06/2023= 40/56 Goal status: MET  4.  Patient will increase 10 meter walk test to >1.20m/s as to improve gait speed for better community ambulation and to reduce fall risk. Baseline:  0.63 m/s with 4WW; 0.74 m/s; 12/11/2203= 0.66 m/s with 4WW and 0.68 m/s with upright 4WW Goal status: PROGRESSING  5. Patient will transition from skilled PT services to independent with gym based program for long term plan for fitness and management of LE weakness/low back pain.   Baseline: Patient current engaged with outpatient PT services to assist with strength and low back pain management.  Goal status: NEW      PLAN:  PT FREQUENCY: 1-2x/week  PT DURATION: 12 weeks  PLANNED INTERVENTIONS: [id number]- PT Re-evaluation, 97750- Physical Performance Testing, 97110-Therapeutic exercises, 97530- Therapeutic activity, [id number]- Neuromuscular re-education, 97535- Self Care, [id number]- Manual therapy, [id number]- Gait training, [phone]- Orthotic Initial, [phone]- Orthotic/Prosthetic subsequent, [phone]- Canalith repositioning, Patient/Family education, Balance training, Stair training, Taping, Joint mobilization, Spinal mobilization, Vestibular training, DME instructions, Cryotherapy, and Moist heat.  PLAN FOR NEXT SESSION:   Well zone for LE strengthening and mobility training. Continue to progress functional mobility in standing and seated if needed  Continue with some static and dynamic balance activities as appropriate. Manual therapy for lumbar ROM/pain Continue to progress HEP  to activities that promote mobility with the least amount of pain as possible.   Chyrl London, PT Physical Therapist - [facility]  01/03/24, 2:09 PM

## 2024-01-03 NOTE — Progress Notes (Signed)
 Left VM to return call

## 2024-01-04 ENCOUNTER — Telehealth: Payer: Self-pay

## 2024-01-04 NOTE — Telephone Encounter (Signed)
 Pt informed about lab results.

## 2024-01-04 NOTE — Telephone Encounter (Signed)
 Pt LM asking for call back possibly about results she wasn't sure

## 2024-01-08 ENCOUNTER — Ambulatory Visit

## 2024-01-08 ENCOUNTER — Ambulatory Visit: Admitting: Occupational Therapy

## 2024-01-08 DIAGNOSIS — I890 Lymphedema, not elsewhere classified: Secondary | ICD-10-CM

## 2024-01-08 DIAGNOSIS — R262 Difficulty in walking, not elsewhere classified: Secondary | ICD-10-CM | POA: Diagnosis not present

## 2024-01-08 DIAGNOSIS — R278 Other lack of coordination: Secondary | ICD-10-CM

## 2024-01-08 DIAGNOSIS — M5459 Other low back pain: Secondary | ICD-10-CM | POA: Diagnosis not present

## 2024-01-08 DIAGNOSIS — M25551 Pain in right hip: Secondary | ICD-10-CM | POA: Diagnosis not present

## 2024-01-08 DIAGNOSIS — M6281 Muscle weakness (generalized): Secondary | ICD-10-CM | POA: Diagnosis not present

## 2024-01-08 NOTE — Therapy (Signed)
 OUTPATIENT PHYSICAL THERAPY THORACOLUMBAR TREATMENT   Patient Name: Judith Arnold MRN: [id number] DOB:1950-11-06, 73 y.o., female Today's Date: 01/08/2024  END OF SESSION:  PT End of Session - 01/08/24 1322     Visit Number 28    Number of Visits 44    Date for Recertification  03/04/24    Progress Note Due on Visit 30    PT Start Time 1315    PT Stop Time 1358    PT Time Calculation (min) 43 min    Equipment Utilized During Treatment Gait belt    Activity Tolerance Patient tolerated treatment well;Patient limited by pain    Behavior During Therapy WFL for tasks assessed/performed               Past Medical History:  Diagnosis Date   Allergy    Arthritis    lower back, left shoulder   Asthma    Carpal tunnel syndrome on both sides    Dental bridge present    top   Dental crown present    multiple   Family history of adverse reaction to anesthesia    Father and sister - PONV   GERD (gastroesophageal reflux disease)    Glaucoma    Hypertension    Leaky heart valve    Pre-diabetes    Sleep apnea    Past Surgical History:  Procedure Laterality Date   CATARACT EXTRACTION W/PHACO Right 11/16/2016   Procedure: CATARACT EXTRACTION PHACO AND INTRAOCULAR LENS PLACEMENT (IOC)  right;  Surgeon: Mittie Gaskin, MD;  Location: [facility];  Service: Ophthalmology;  Laterality: Right;  pre diabetic latex sensitivity sleep apnea   CATARACT EXTRACTION W/PHACO Left 01/18/2017   Procedure: CATARACT EXTRACTION PHACO AND INTRAOCULAR LENS PLACEMENT (IOC) LEFT DIABETIC;  Surgeon: Mittie Gaskin, MD;  Location: [facility];  Service: Ophthalmology;  Laterality: Left;   COLONOSCOPY     COLONOSCOPY WITH PROPOFOL  N/A 04/09/2018   Procedure: COLONOSCOPY WITH PROPOFOL ;  Surgeon: Viktoria Lamar DASEN, MD;  Location: [facility];  Service: Endoscopy;  Laterality: N/A;   IRIDOTOMY / IRIDECTOMY Bilateral 2005   TOOTH EXTRACTION     Patient Active Problem  List   Diagnosis Date Noted   Needs flu shot 01/02/2024   Chronic bilateral low back pain with right-sided sciatica 09/29/2023   Hypertension associated with diabetes (HCC) 01/20/2023   Combined hyperlipidemia associated with type 2 diabetes mellitus (HCC) 01/20/2023   Intertrigo 11/01/2022   Stasis dermatitis of both legs 10/21/2022   Leg swelling 10/21/2022   Lymphedema of lower extremity 10/21/2022   Type 2 diabetes mellitus without complication, without long-term current use of insulin (HCC) 05/24/2022   Mixed hyperlipidemia 05/24/2022   Essential hypertension, benign 05/24/2022   Absolute anemia 05/24/2022   Gastroesophageal reflux disease without esophagitis 05/24/2022   Allergic rhinitis 05/24/2022   Asthma, allergic, moderate persistent, uncomplicated 05/24/2022   Bilateral chronic angle-closure glaucoma, indeterminate stage 05/24/2022   Airway hyperreactivity 08/29/2013   OSA on CPAP 08/29/2013    PCP: Fernand Fredy RAMAN, MD  REFERRING PROVIDER: Fernand Fredy, RAMAN, MD  REFERRING DIAG:  Diagnosis  G89.29,M54.41 (ICD-10-CM) - Chronic bilateral low back pain with right-sided sciatica    Rationale for Evaluation and Treatment: Rehabilitation  THERAPY DIAG:  Other low back pain  Difficulty in walking, not elsewhere classified  Pain in right hip  Muscle weakness (generalized)  Other lack of coordination  ONSET DATE: >3 years   SUBJECTIVE:  SUBJECTIVE STATEMENT:  Patient reports doing well-states had a quiet weekend. States her left LE is just a mess.       From EVAL: Pt is a pleasant 73 y/o female presenting to PT eval for chronic LBP with R side hip pain. She reports onset of LBP over 3 years ago. Primary site of pain is mostly felt in R hip and lower spine. Pt reports hx of arthritis in lower back  and R hip. She is now starting to feel arthritis pain in her knees as well. Reports RLE is actually her good leg, has some issues with pain into groin region of LLE. Pt states strength in legs has been impacted and that her general mobility is worse. Pt can only ambulate comfortably by taking weight off of lower back by using her 4WW. She states not using 4WW so much for balance, but for reducing pain with gait. She is unable to stand up fully due to pain. Pt says she used to be very fit (took take karate, gymnastics years ago). She is also seeing OT for BLE lymphedema management. She endorses difficulty getting out of chairs, difficulty with steps, she must use a ramp to get in and out of her home. Pt reports my brain does not retain what I'm talking about, can lose train of thought.  PERTINENT HISTORY:  PMH per chart includes arthritis low back and left shoulder, bilateral carpal tunnel, asthma, glaucoma, HTN, leaky heart valve, sleep apnea, pre-diabetes, being seen by lymphedema specialist for BLE lymphedema   PAIN:  Are you having pain? LBP and R hip pain  Lowest pain level: 0/10  With standing: 2/10  Worst: 5-6/10   PRECAUTIONS: LATEX ALLERGY per chart, fall  RED FLAGS: None  WEIGHT BEARING RESTRICTIONS: No  FALLS:  Has patient fallen in last 6 months? No  LIVING ENVIRONMENT: Uses ramp to get into home due to difficulty with steps, has 4WW  PLOF: Independent  PATIENT GOALS:  Says due to back pain she is unable to reach my feet, has difficulty getting her socks on without a grabber, wants to be more flexible and stronger to increase ease with these activities and mobility    OBJECTIVE:  Note: Objective measures were completed at Evaluation unless otherwise noted.  DIAGNOSTIC FINDINGS:  No recent pertinent imaging available in chart  PATIENT SURVEYS:  Modified Oswestry score: 34%  Interpretation of scores: Score Category Description  0-20% Minimal Disability The  patient can cope with most living activities. Usually no treatment is indicated apart from advice on lifting, sitting and exercise  21-40% Moderate Disability The patient experiences more pain and difficulty with sitting, lifting and standing. Travel and social life are more difficult and they may be disabled from work. Personal care, sexual activity and sleeping are not grossly affected, and the patient can usually be managed by conservative means  41-60% Severe Disability Pain remains the main problem in this group, but activities of daily living are affected. These patients require a detailed investigation  61-80% Crippled Back pain impinges on all aspects of the patient's life. Positive intervention is required  81-100% Bed-bound  These patients are either bed-bound or exaggerating their symptoms  Bluford FORBES Zoe DELENA Karon DELENA, et al. Surgery versus conservative management of stable thoracolumbar fracture: the PRESTO feasibility RCT. [place] ([place]): [employer]; 2021 Nov. [facility] Technology Assessment, No. 25.62.) Appendix 3, Oswestry Disability Index category descriptors. Available from: Findjewelers.cz  Minimally Clinically Important Difference (MCID) = 12.8%  COGNITION: Overall  cognitive status: Within functional limits for tasks assessed, very pleasant pt can become tangential and requires redirection to activity    SENSATION: Intact to light touch BLE   MUSCLE LENGTH: deferred  POSTURE: increased thoracic kyphosis, rounded shoulders, unable to achieve full upright position due to pain, maintains increased hip and knee flexion when attempting to stand fully upright   PALPATION: deferred  Thoracolumbar AROM: Extension - unable to achieve neutral position, remains in significant flexion  Flexion - lacking at least 25%  Rotation - lacking at least 40% bilat  and pain-limited    LOWER EXTREMITY MMT:    MMT Right eval Left eval  Hip  flexion 4- 3*  Hip extension    Hip abduction 4+ 4+  Hip adduction 4+ 4+  Hip internal rotation    Hip external rotation    Knee flexion 4 3*  Knee extension 4+ 4+  Ankle dorsiflexion 4 4+  Ankle plantarflexion    Ankle inversion    Ankle eversion     (Blank rows = not tested) *=pain limited   LUMBAR SPECIAL TESTS:  deferred  FUNCTIONAL TESTS:  10 meter walk test: 0.63 m/s crouched posture, decreased hip extension, decreased step-length and heel strike, elevated R>L shoulder, heavy BUE weightbearing on 4WW  GAIT: Distance walked: 10MWT/clinic distances (see above) Assistive device utilized: Environmental Consultant - 4 wheeled Level of assistance: Modified independence Comments: gait mechanics impaired: decreased gait speed, crouched posture, decreased hip extension, decreased step-length and heel strike, elevated R>L shoulder, heavy BUE weightbearing on 4WW  10 Meter Walk Test: Patient instructed to walk 10 meters (32.8 ft) as quickly and as safely as possible at their normal speed x2 and at a fast speed x2. Time measured from 2 meter mark to 8 meter mark to accommodate ramp-up and ramp-down.  Normal speed 1: 0.66 m/s with rollator Normal speed 2: 0.66 m/s with rollator Normal speed 3: 0.68 m/s with upright 4WW Normal speed 4: 0.68 m/s with upright 4WW Average Normal speed: 0.67 m/s  Cut off scores: <0.4 m/s = household Ambulator, 0.4-0.8 m/s = limited community Ambulator, >0.8 m/s = community Ambulator, >1.2 m/s = crossing a street, <1.0 = increased fall risk MCID 0.05 m/s (small), 0.13 m/s (moderate), 0.06 m/s (significant)  (ANPTA Core Set of Outcome Measures for Adults with Neurologic Conditions, 2018)   MODIFIED OSWESTRY DISABILITY SCALE  Date: 12/11/2023 Score  Pain intensity 3 =  Pain medication provides me with moderate relief from pain.  2. Personal care (washing, dressing, etc.) 1 =  I can take care of myself normally, but it increases my pain.  3. Lifting 1 = I can lift heavy  weights, but it causes increased pain.  4. Walking 4 = I can only walk with crutches or a cane.  5. Sitting 1 =  I can only sit in my favorite chair as long as I like.  6. Standing 3 =  Pain prevents me from standing more than 1/2 hour.  7. Sleeping 1 = I can sleep well only by using pain medication.  8. Social Life 2 = Pain prevents me from participating in more energetic activities (eg. sports, dancing).  9. Traveling 1 =  I can travel anywhere, but it increases my pain.  10. Employment/ Homemaking 2 = I can perform most of my homemaking/job duties, but pain prevents me from performing more physically stressful activities (eg, lifting, vacuuming).  Total 19/50 = 38%   Interpretation of scores: Score Category Description  0-20% Minimal  Disability The patient can cope with most living activities. Usually no treatment is indicated apart from advice on lifting, sitting and exercise  21-40% Moderate Disability The patient experiences more pain and difficulty with sitting, lifting and standing. Travel and social life are more difficult and they may be disabled from work. Personal care, sexual activity and sleeping are not grossly affected, and the patient can usually be managed by conservative means  41-60% Severe Disability Pain remains the main problem in this group, but activities of daily living are affected. These patients require a detailed investigation  61-80% Crippled Back pain impinges on all aspects of the patient's life. Positive intervention is required  81-100% Bed-bound  These patients are either bed-bound or exaggerating their symptoms  Bluford FORBES Zoe DELENA Karon DELENA, et al. Surgery versus conservative management of stable thoracolumbar fracture: the PRESTO feasibility RCT. [place] ([place]): [employer]; 2021 Nov. [facility] Technology Assessment, No. 25.62.) Appendix 3, Oswestry Disability Index category descriptors. Available from:  Findjewelers.cz  Minimally Clinically Important Difference (MCID) = 12.8%   Pt performed 5 time sit<>stand (5xSTS): 21.78 sec without UE support sec (>15 sec indicates increased fall risk)      TREATMENT DATE: 01/08/24   TA: Nustep - interval training to promote LE strength/cardioresp endurance. PT sets up intervention, adjusts intensity throughout and monitors pt for response. Cuing for SPM/speed, pt maintains SPM in 50s  x 7 min with BUE/LE  NMR:  Posterior shoulder roll 2# DB 2 x 10 reps Scap rows 2# DB (standing bent over) 2 x 12 reps Standing (bent over) shoulder ext 2# DB - 2 x 12 reps  Seated horizontal shoulder abd 2# DB- 2 x 12 reps Seated scap rows 2# DB 2 x 12 reps  Seated dead lift 2#DB ea ARM- 2 x 12 reps    TE:  Seated bicep curl 2# db 2 x 10 reps      PATIENT EDUCATION:  Education details: Exercise technique strategies                  Person educated: Patient Education method: Explanation Education comprehension: verbalized understanding  HOME EXERCISE PROGRAM:  Access Code: [email or web address] URL: [email or web address] Date: 01/08/2024 Prepared by: Reyes London  Exercises - Standing Bent Over Single Arm Scapular Row with Table Support with PLB  - 3 x weekly - 3 sets - 10 reps - Seated Shoulder Row with Anchored Resistance  - 3 x weekly - 3 sets - 10 reps - Seated Shoulder Horizontal Abduction with Dumbbells - Thumbs Up  - 3 x weekly - 3 sets - 10 reps - Seated Bicep Curls Supinated with Dumbbells  - 3 x weekly - 3 sets - 10 reps - Seated Flexion Stretch  - 3 x weekly - 3 sets - 10 reps     Access Code: [email or web address] URL: [email or web address] Date: 12/28/2023 Prepared by: Reyes London  Exercises - Seated Shoulder Pendulum Exercise  - 1 x daily - 3 sets - 10 reps - Seated Shoulder Flexion Towel Slide at Table Top  - 1 x daily - 3 sets - 10 reps - Seated Shoulder Flexion  - 1 x daily - 3  sets - 10 reps     Access Code: [email or web address] URL: [email or web address] Date: 10/05/2023 Prepared by: Lonni Gainer  Exercises - Supine Bridge  - 3 x weekly - 3 sets - 10 reps - Clamshell  - 3 x weekly - 3 sets - 10 reps - Supine Lower Trunk Rotation  - 1  x daily - 3 sets - 30 sec hold - Seated Hamstring Stretch  - 1 x daily - 3 sets - 30 hold - Seated Clamshell  - 1 x daily - 7 x weekly - 2 sets - 20 reps - 3 sec hold - Supine Transversus Abdominis Bracing - Hands on Stomach  - 3 x weekly - 3 sets - 10 reps - Supine Gluteal Sets  - 3 x weekly - 3 sets - 10 reps  ASSESSMENT:  CLINICAL IMPRESSION: Reviewed some activities in anticipation of transition to HEP in near future. She performed some postural awareness activities and functional endurance activity including Nustep without significant difficulty overall.  She was responsive to all instruction and did not report any worsening of back pain. Pt will continue to benefit from skilled therapy to address remaining deficits in order to improve overall QoL and return to PLOF.      OBJECTIVE IMPAIRMENTS: Abnormal gait, decreased activity tolerance, decreased balance, decreased mobility, difficulty walking, decreased strength, hypomobility, increased edema, improper body mechanics, postural dysfunction, and pain.   ACTIVITY LIMITATIONS: carrying, lifting, bending, squatting, stairs, transfers, bed mobility, and locomotion level  PARTICIPATION LIMITATIONS: meal prep, cleaning, shopping, community activity, and yard work  PERSONAL FACTORS: Age, Fitness, Sex, Time since onset of injury/illness/exacerbation, and 3+ comorbidities: PMH per chart includes arthritis low back and left shoulder, bilateral carpal tunnel, asthma, glaucoma, HTN, leaky heart valve, sleep apnea, pre-diabetes, being seen by lymphedema specialist for BLE lymphedema  are also affecting patient's functional outcome.   REHAB POTENTIAL: Good  CLINICAL DECISION  MAKING: Evolving/moderate complexity  EVALUATION COMPLEXITY: Moderate   GOALS: Goals reviewed with patient? Yes   SHORT TERM GOALS: Target date: 10/31/2023    Patient will be independent in home exercise program to improve strength/mobility for better functional independence with ADLs. Baseline:to be initiated; 11/06/2023=Patient reports knowledgeable of HEP but not always able to perform and sometimes painful so has to modify.  Goal status: MET   LONG TERM GOALS: Target date: 03/04/2024    Patient will reduce modified Oswestry score to <20 as to demonstrate minimal disability with ADLs including improved sleeping tolerance, walking/sitting tolerance etc for better mobility with ADLs.  Baseline: 34; 11/06/2023= 32; 12/11/2023= 38% Goal status: ONGOING  2.  Patient (> 59 years old) will complete five times sit to stand test in < 15 seconds indicating an increased LE strength and improved balance. Baseline: 09/26/2023= 26.46 sec with heavy UE Support;  11/06/2023= 22.31 sec without UE Support; 12/11/2023 =21.78 sec without UE support Goal status: PROGRESSING  3.  Patient will increase Berg Balance score by > 6 points to demonstrate decreased fall risk during functional activities Baseline: 09/26/2023= 31/56; 11/06/2023= 40/56 Goal status: MET  4.  Patient will increase 10 meter walk test to >1.54m/s as to improve gait speed for better community ambulation and to reduce fall risk. Baseline:  0.63 m/s with 4WW; 0.74 m/s; 12/11/2203= 0.66 m/s with 4WW and 0.68 m/s with upright 4WW Goal status: PROGRESSING  5. Patient will transition from skilled PT services to independent with gym based program for long term plan for fitness and management of LE weakness/low back pain.   Baseline: Patient current engaged with outpatient PT services to assist with strength and low back pain management.  Goal status: NEW      PLAN:  PT FREQUENCY: 1-2x/week  PT DURATION: 12 weeks  PLANNED  INTERVENTIONS: 97164- PT Re-evaluation, 97750- Physical Performance Testing, 97110-Therapeutic exercises, 97530- Therapeutic activity, [id number]- Neuromuscular re-education, 97535-  Self Care, 02859- Manual therapy, [phone]- Gait training, [phone]- Orthotic Initial, [phone]- Orthotic/Prosthetic subsequent, [phone]- Canalith repositioning, Patient/Family education, Balance training, Stair training, Taping, Joint mobilization, Spinal mobilization, Vestibular training, DME instructions, Cryotherapy, and Moist heat.  PLAN FOR NEXT SESSION:   Progressive LE strengthening and mobility training. Continue to progress functional mobility in standing and seated if needed  Continue with some static and dynamic balance activities as appropriate. Manual therapy for lumbar ROM/pain Continue to progress HEP to activities that promote mobility with the least amount of pain as possible.   Chyrl London, PT Physical Therapist - [facility]  01/08/24, 4:00 PM

## 2024-01-10 ENCOUNTER — Ambulatory Visit

## 2024-01-10 ENCOUNTER — Ambulatory Visit: Admitting: Occupational Therapy

## 2024-01-10 ENCOUNTER — Encounter: Admitting: Occupational Therapy

## 2024-01-10 DIAGNOSIS — M25551 Pain in right hip: Secondary | ICD-10-CM

## 2024-01-10 DIAGNOSIS — R278 Other lack of coordination: Secondary | ICD-10-CM | POA: Diagnosis not present

## 2024-01-10 DIAGNOSIS — M5459 Other low back pain: Secondary | ICD-10-CM

## 2024-01-10 DIAGNOSIS — I890 Lymphedema, not elsewhere classified: Secondary | ICD-10-CM | POA: Diagnosis not present

## 2024-01-10 DIAGNOSIS — M6281 Muscle weakness (generalized): Secondary | ICD-10-CM | POA: Diagnosis not present

## 2024-01-10 DIAGNOSIS — R262 Difficulty in walking, not elsewhere classified: Secondary | ICD-10-CM | POA: Diagnosis not present

## 2024-01-10 NOTE — Therapy (Signed)
 OUTPATIENT PHYSICAL THERAPY THORACOLUMBAR TREATMENT   Patient Name: Judith Arnold MRN: [id number] DOB:1951/03/12, 73 y.o., female Today's Date: 01/10/2024  END OF SESSION:  PT End of Session - 01/10/24 1327     Visit Number 29    Number of Visits 44    Date for Recertification  03/04/24    Progress Note Due on Visit 30    PT Start Time 1315    PT Stop Time 1358    PT Time Calculation (min) 43 min    Equipment Utilized During Treatment Gait belt    Activity Tolerance Patient tolerated treatment well;Patient limited by pain    Behavior During Therapy WFL for tasks assessed/performed               Past Medical History:  Diagnosis Date   Allergy    Arthritis    lower back, left shoulder   Asthma    Carpal tunnel syndrome on both sides    Dental bridge present    top   Dental crown present    multiple   Family history of adverse reaction to anesthesia    Father and sister - PONV   GERD (gastroesophageal reflux disease)    Glaucoma    Hypertension    Leaky heart valve    Pre-diabetes    Sleep apnea    Past Surgical History:  Procedure Laterality Date   CATARACT EXTRACTION W/PHACO Right 11/16/2016   Procedure: CATARACT EXTRACTION PHACO AND INTRAOCULAR LENS PLACEMENT (IOC)  right;  Surgeon: Mittie Gaskin, MD;  Location: [facility];  Service: Ophthalmology;  Laterality: Right;  pre diabetic latex sensitivity sleep apnea   CATARACT EXTRACTION W/PHACO Left 01/18/2017   Procedure: CATARACT EXTRACTION PHACO AND INTRAOCULAR LENS PLACEMENT (IOC) LEFT DIABETIC;  Surgeon: Mittie Gaskin, MD;  Location: [facility];  Service: Ophthalmology;  Laterality: Left;   COLONOSCOPY     COLONOSCOPY WITH PROPOFOL  N/A 04/09/2018   Procedure: COLONOSCOPY WITH PROPOFOL ;  Surgeon: Viktoria Lamar DASEN, MD;  Location: [facility];  Service: Endoscopy;  Laterality: N/A;   IRIDOTOMY / IRIDECTOMY Bilateral 2005   TOOTH EXTRACTION     Patient Active Problem  List   Diagnosis Date Noted   Needs flu shot 01/02/2024   Chronic bilateral low back pain with right-sided sciatica 09/29/2023   Hypertension associated with diabetes (HCC) 01/20/2023   Combined hyperlipidemia associated with type 2 diabetes mellitus (HCC) 01/20/2023   Intertrigo 11/01/2022   Stasis dermatitis of both legs 10/21/2022   Leg swelling 10/21/2022   Lymphedema of lower extremity 10/21/2022   Type 2 diabetes mellitus without complication, without long-term current use of insulin (HCC) 05/24/2022   Mixed hyperlipidemia 05/24/2022   Essential hypertension, benign 05/24/2022   Absolute anemia 05/24/2022   Gastroesophageal reflux disease without esophagitis 05/24/2022   Allergic rhinitis 05/24/2022   Asthma, allergic, moderate persistent, uncomplicated 05/24/2022   Bilateral chronic angle-closure glaucoma, indeterminate stage 05/24/2022   Airway hyperreactivity 08/29/2013   OSA on CPAP 08/29/2013    PCP: Fernand Fredy RAMAN, MD  REFERRING PROVIDER: Fernand Fredy, RAMAN, MD  REFERRING DIAG:  Diagnosis  G89.29,M54.41 (ICD-10-CM) - Chronic bilateral low back pain with right-sided sciatica    Rationale for Evaluation and Treatment: Rehabilitation  THERAPY DIAG:  Other low back pain  Difficulty in walking, not elsewhere classified  Pain in right hip  Muscle weakness (generalized)  Other lack of coordination  ONSET DATE: >3 years   SUBJECTIVE:  SUBJECTIVE STATEMENT:  Patient reports doing well-states had a quiet weekend. States her left LE is just a mess.       From EVAL: Pt is a pleasant 73 y/o female presenting to PT eval for chronic LBP with R side hip pain. She reports onset of LBP over 3 years ago. Primary site of pain is mostly felt in R hip and lower spine. Pt reports hx of arthritis in lower back  and R hip. She is now starting to feel arthritis pain in her knees as well. Reports RLE is actually her good leg, has some issues with pain into groin region of LLE. Pt states strength in legs has been impacted and that her general mobility is worse. Pt can only ambulate comfortably by taking weight off of lower back by using her 4WW. She states not using 4WW so much for balance, but for reducing pain with gait. She is unable to stand up fully due to pain. Pt says she used to be very fit (took take karate, gymnastics years ago). She is also seeing OT for BLE lymphedema management. She endorses difficulty getting out of chairs, difficulty with steps, she must use a ramp to get in and out of her home. Pt reports my brain does not retain what I'm talking about, can lose train of thought.  PERTINENT HISTORY:  PMH per chart includes arthritis low back and left shoulder, bilateral carpal tunnel, asthma, glaucoma, HTN, leaky heart valve, sleep apnea, pre-diabetes, being seen by lymphedema specialist for BLE lymphedema   PAIN:  Are you having pain? LBP and R hip pain  Lowest pain level: 0/10  With standing: 2/10  Worst: 5-6/10   PRECAUTIONS: LATEX ALLERGY per chart, fall  RED FLAGS: None  WEIGHT BEARING RESTRICTIONS: No  FALLS:  Has patient fallen in last 6 months? No  LIVING ENVIRONMENT: Uses ramp to get into home due to difficulty with steps, has 4WW  PLOF: Independent  PATIENT GOALS:  Says due to back pain she is unable to reach my feet, has difficulty getting her socks on without a grabber, wants to be more flexible and stronger to increase ease with these activities and mobility    OBJECTIVE:  Note: Objective measures were completed at Evaluation unless otherwise noted.  DIAGNOSTIC FINDINGS:  No recent pertinent imaging available in chart  PATIENT SURVEYS:  Modified Oswestry score: 34%  Interpretation of scores: Score Category Description  0-20% Minimal Disability The  patient can cope with most living activities. Usually no treatment is indicated apart from advice on lifting, sitting and exercise  21-40% Moderate Disability The patient experiences more pain and difficulty with sitting, lifting and standing. Travel and social life are more difficult and they may be disabled from work. Personal care, sexual activity and sleeping are not grossly affected, and the patient can usually be managed by conservative means  41-60% Severe Disability Pain remains the main problem in this group, but activities of daily living are affected. These patients require a detailed investigation  61-80% Crippled Back pain impinges on all aspects of the patient's life. Positive intervention is required  81-100% Bed-bound  These patients are either bed-bound or exaggerating their symptoms  Bluford FORBES Zoe DELENA Karon DELENA, et al. Surgery versus conservative management of stable thoracolumbar fracture: the PRESTO feasibility RCT. [place] ([place]): [employer]; 2021 Nov. [facility] [employer] Assessment, No. 25.62.) Appendix 3, Oswestry Disability Index category descriptors. Available from: Findjewelers.cz  Minimally Clinically Important Difference (MCID) = 12.8%  COGNITION: Overall  cognitive status: Within functional limits for tasks assessed, very pleasant pt can become tangential and requires redirection to activity    SENSATION: Intact to light touch BLE   MUSCLE LENGTH: deferred  POSTURE: increased thoracic kyphosis, rounded shoulders, unable to achieve full upright position due to pain, maintains increased hip and knee flexion when attempting to stand fully upright   PALPATION: deferred  Thoracolumbar AROM: Extension - unable to achieve neutral position, remains in significant flexion  Flexion - lacking at least 25%  Rotation - lacking at least 40% bilat  and pain-limited    LOWER EXTREMITY MMT:    MMT Right eval Left eval  Hip  flexion 4- 3*  Hip extension    Hip abduction 4+ 4+  Hip adduction 4+ 4+  Hip internal rotation    Hip external rotation    Knee flexion 4 3*  Knee extension 4+ 4+  Ankle dorsiflexion 4 4+  Ankle plantarflexion    Ankle inversion    Ankle eversion     (Blank rows = not tested) *=pain limited   LUMBAR SPECIAL TESTS:  deferred  FUNCTIONAL TESTS:  10 meter walk test: 0.63 m/s crouched posture, decreased hip extension, decreased step-length and heel strike, elevated R>L shoulder, heavy BUE weightbearing on 4WW  GAIT: Distance walked: 10MWT/clinic distances (see above) Assistive device utilized: Environmental Consultant - 4 wheeled Level of assistance: Modified independence Comments: gait mechanics impaired: decreased gait speed, crouched posture, decreased hip extension, decreased step-length and heel strike, elevated R>L shoulder, heavy BUE weightbearing on 4WW  10 Meter Walk Test: Patient instructed to walk 10 meters (32.8 ft) as quickly and as safely as possible at their normal speed x2 and at a fast speed x2. Time measured from 2 meter mark to 8 meter mark to accommodate ramp-up and ramp-down.  Normal speed 1: 0.66 m/s with rollator Normal speed 2: 0.66 m/s with rollator Normal speed 3: 0.68 m/s with upright 4WW Normal speed 4: 0.68 m/s with upright 4WW Average Normal speed: 0.67 m/s  Cut off scores: <0.4 m/s = household Ambulator, 0.4-0.8 m/s = limited community Ambulator, >0.8 m/s = community Ambulator, >1.2 m/s = crossing a street, <1.0 = increased fall risk MCID 0.05 m/s (small), 0.13 m/s (moderate), 0.06 m/s (significant)  (ANPTA Core Set of Outcome Measures for Adults with Neurologic Conditions, 2018)   MODIFIED OSWESTRY DISABILITY SCALE  Date: 12/11/2023 Score  Pain intensity 3 =  Pain medication provides me with moderate relief from pain.  2. Personal care (washing, dressing, etc.) 1 =  I can take care of myself normally, but it increases my pain.  3. Lifting 1 = I can lift heavy  weights, but it causes increased pain.  4. Walking 4 = I can only walk with crutches or a cane.  5. Sitting 1 =  I can only sit in my favorite chair as long as I like.  6. Standing 3 =  Pain prevents me from standing more than 1/2 hour.  7. Sleeping 1 = I can sleep well only by using pain medication.  8. Social Life 2 = Pain prevents me from participating in more energetic activities (eg. sports, dancing).  9. Traveling 1 =  I can travel anywhere, but it increases my pain.  10. Employment/ Homemaking 2 = I can perform most of my homemaking/job duties, but pain prevents me from performing more physically stressful activities (eg, lifting, vacuuming).  Total 19/50 = 38%   Interpretation of scores: Score Category Description  0-20% Minimal  Disability The patient can cope with most living activities. Usually no treatment is indicated apart from advice on lifting, sitting and exercise  21-40% Moderate Disability The patient experiences more pain and difficulty with sitting, lifting and standing. Travel and social life are more difficult and they may be disabled from work. Personal care, sexual activity and sleeping are not grossly affected, and the patient can usually be managed by conservative means  41-60% Severe Disability Pain remains the main problem in this group, but activities of daily living are affected. These patients require a detailed investigation  61-80% Crippled Back pain impinges on all aspects of the patient's life. Positive intervention is required  81-100% Bed-bound  These patients are either bed-bound or exaggerating their symptoms  Bluford FORBES Zoe DELENA Karon DELENA, et al. Surgery versus conservative management of stable thoracolumbar fracture: the PRESTO feasibility RCT. [place] ([place]): [employer]; 2021 Nov. [facility] Technology Assessment, No. 25.62.) Appendix 3, Oswestry Disability Index category descriptors. Available from:  Findjewelers.cz  Minimally Clinically Important Difference (MCID) = 12.8%   Pt performed 5 time sit<>stand (5xSTS): 21.78 sec without UE support sec (>15 sec indicates increased fall risk)      TREATMENT DATE: 01/10/24   Circuit style workout  Rd 1:  Standing hip flex 3# each LE 2 x 10  Seated knee ext 3# each LE 2  x 10 Resistive ambulation 3# AW ea LE  x 200 feet   Rd 2: Standing hip abd 3# AW x 12 reps  Standing Hip ext 3# AW x 12 reps  Resistive ambulation 3#AW ea LE x 200 feet  Rd 3:   Scap rows into chest press2# DB (seated) 2 x 12 reps Alt UE shoulder flex 2# (to shoulder height) 2 x 10 reps  Resistive ambulation 3# AW x 200 feet  Rd 4:  Sit to stand x 10 reps without UE support Seated horizontal shoulder abd  x 10 reps  Resistive amb 3# AW x 200 feet         PATIENT EDUCATION:  Education details: Exercise technique strategies                  Person educated: Patient Education method: Explanation Education comprehension: verbalized understanding  HOME EXERCISE PROGRAM:  Access Code: [email or web address] URL: [email or web address] Date: 01/08/2024 Prepared by: Reyes London  Exercises - Standing Bent Over Single Arm Scapular Row with Table Support with PLB  - 3 x weekly - 3 sets - 10 reps - Seated Shoulder Row with Anchored Resistance  - 3 x weekly - 3 sets - 10 reps - Seated Shoulder Horizontal Abduction with Dumbbells - Thumbs Up  - 3 x weekly - 3 sets - 10 reps - Seated Bicep Curls Supinated with Dumbbells  - 3 x weekly - 3 sets - 10 reps - Seated Flexion Stretch  - 3 x weekly - 3 sets - 10 reps     Access Code: [email or web address] URL: [email or web address] Date: 12/28/2023 Prepared by: Reyes London  Exercises - Seated Shoulder Pendulum Exercise  - 1 x daily - 3 sets - 10 reps - Seated Shoulder Flexion Towel Slide at Table Top  - 1 x daily - 3 sets - 10 reps - Seated Shoulder Flexion  - 1 x  daily - 3 sets - 10 reps     Access Code: [email or web address] URL: [email or web address] Date: 10/05/2023 Prepared by: Lonni Gainer  Exercises - Supine Bridge  - 3 x weekly - 3 sets - 10 reps - Clamshell  -  3 x weekly - 3 sets - 10 reps - Supine Lower Trunk Rotation  - 1 x daily - 3 sets - 30 sec hold - Seated Hamstring Stretch  - 1 x daily - 3 sets - 30 hold - Seated Clamshell  - 1 x daily - 7 x weekly - 2 sets - 20 reps - 3 sec hold - Supine Transversus Abdominis Bracing - Hands on Stomach  - 3 x weekly - 3 sets - 10 reps - Supine Gluteal Sets  - 3 x weekly - 3 sets - 10 reps  ASSESSMENT:  CLINICAL IMPRESSION: Treatment focused on functional endurance and overall postural/LE strengthening. Patient denied any pain during session today. She did endorse fatigue with each round of circuit today. She was proud of her effort and able to progress with overall postural strength- complaining of tiredness over any pain in shoulder or back today. Pt will continue to benefit from skilled therapy to address remaining deficits in order to improve overall QoL and return to PLOF.      OBJECTIVE IMPAIRMENTS: Abnormal gait, decreased activity tolerance, decreased balance, decreased mobility, difficulty walking, decreased strength, hypomobility, increased edema, improper body mechanics, postural dysfunction, and pain.   ACTIVITY LIMITATIONS: carrying, lifting, bending, squatting, stairs, transfers, bed mobility, and locomotion level  PARTICIPATION LIMITATIONS: meal prep, cleaning, shopping, community activity, and yard work  PERSONAL FACTORS: Age, Fitness, Sex, Time since onset of injury/illness/exacerbation, and 3+ comorbidities: PMH per chart includes arthritis low back and left shoulder, bilateral carpal tunnel, asthma, glaucoma, HTN, leaky heart valve, sleep apnea, pre-diabetes, being seen by lymphedema specialist for BLE lymphedema  are also affecting patient's functional outcome.    REHAB POTENTIAL: Good  CLINICAL DECISION MAKING: Evolving/moderate complexity  EVALUATION COMPLEXITY: Moderate   GOALS: Goals reviewed with patient? Yes   SHORT TERM GOALS: Target date: 10/31/2023    Patient will be independent in home exercise program to improve strength/mobility for better functional independence with ADLs. Baseline:to be initiated; 11/06/2023=Patient reports knowledgeable of HEP but not always able to perform and sometimes painful so has to modify.  Goal status: MET   LONG TERM GOALS: Target date: 03/04/2024    Patient will reduce modified Oswestry score to <20 as to demonstrate minimal disability with ADLs including improved sleeping tolerance, walking/sitting tolerance etc for better mobility with ADLs.  Baseline: 34; 11/06/2023= 32; 12/11/2023= 38% Goal status: ONGOING  2.  Patient (> 46 years old) will complete five times sit to stand test in < 15 seconds indicating an increased LE strength and improved balance. Baseline: 09/26/2023= 26.46 sec with heavy UE Support;  11/06/2023= 22.31 sec without UE Support; 12/11/2023 =21.78 sec without UE support Goal status: PROGRESSING  3.  Patient will increase Berg Balance score by > 6 points to demonstrate decreased fall risk during functional activities Baseline: 09/26/2023= 31/56; 11/06/2023= 40/56 Goal status: MET  4.  Patient will increase 10 meter walk test to >1.81m/s as to improve gait speed for better community ambulation and to reduce fall risk. Baseline:  0.63 m/s with 4WW; 0.74 m/s; 12/11/2203= 0.66 m/s with 4WW and 0.68 m/s with upright 4WW Goal status: PROGRESSING  5. Patient will transition from skilled PT services to independent with gym based program for long term plan for fitness and management of LE weakness/low back pain.   Baseline: Patient current engaged with outpatient PT services to assist with strength and low back pain management.  Goal status: NEW      PLAN:  PT FREQUENCY:  1-2x/week  PT DURATION: 12 weeks  PLANNED INTERVENTIONS: 97164- PT Re-evaluation, 97750- Physical Performance Testing, 97110-Therapeutic exercises, 97530- Therapeutic activity, [id number]- Neuromuscular re-education, 97535- Self Care, [id number]- Manual therapy, [phone]- Gait training, [phone]- Orthotic Initial, [phone]- Orthotic/Prosthetic subsequent, [phone]- Canalith repositioning, Patient/Family education, Balance training, Stair training, Taping, Joint mobilization, Spinal mobilization, Vestibular training, DME instructions, Cryotherapy, and Moist heat.  PLAN FOR NEXT SESSION:   Progressive LE strengthening and mobility training. Continue to progress functional mobility in standing and seated if needed  Continue with some static and dynamic balance activities as appropriate. Manual therapy for lumbar ROM/pain Continue to progress HEP to activities that promote mobility with the least amount of pain as possible.   Chyrl London, PT Physical Therapist - [facility]  01/10/24, 2:05 PM

## 2024-01-11 NOTE — Therapy (Signed)
 OUTPATIENT OCCUPATIONAL THERAPY TREATMENT NOTE  BILATERAL LOWER EXTREMITY LYMPHEDEMA  Patient Name: Judith Arnold MRN: [id number] DOB:June 24, 1950, 73 y.o., female Today's Date: 01/11/2024  REPORTING PERIOD:   END OF SESSION:   OT End of Session - 01/11/24 1207     Visit Number 44    Number of Visits 72    Date for Recertification  01/16/24    OT Start Time 0215    OT Stop Time 0315    OT Time Calculation (min) 60 min    Activity Tolerance Patient tolerated treatment well;No increased pain    Behavior During Therapy WFL for tasks assessed/performed           Past Medical History:  Diagnosis Date   Allergy    Arthritis    lower back, left shoulder   Asthma    Carpal tunnel syndrome on both sides    Dental bridge present    top   Dental crown present    multiple   Family history of adverse reaction to anesthesia    Father and sister - PONV   GERD (gastroesophageal reflux disease)    Glaucoma    Hypertension    Leaky heart valve    Pre-diabetes    Sleep apnea    Past Surgical History:  Procedure Laterality Date   CATARACT EXTRACTION W/PHACO Right 11/16/2016   Procedure: CATARACT EXTRACTION PHACO AND INTRAOCULAR LENS PLACEMENT (IOC)  right;  Surgeon: Mittie Gaskin, MD;  Location: [facility];  Service: Ophthalmology;  Laterality: Right;  pre diabetic latex sensitivity sleep apnea   CATARACT EXTRACTION W/PHACO Left 01/18/2017   Procedure: CATARACT EXTRACTION PHACO AND INTRAOCULAR LENS PLACEMENT (IOC) LEFT DIABETIC;  Surgeon: Mittie Gaskin, MD;  Location: [facility];  Service: Ophthalmology;  Laterality: Left;   COLONOSCOPY     COLONOSCOPY WITH PROPOFOL  N/A 04/09/2018   Procedure: COLONOSCOPY WITH PROPOFOL ;  Surgeon: Viktoria Lamar DASEN, MD;  Location: [facility];  Service: Endoscopy;  Laterality: N/A;   IRIDOTOMY / IRIDECTOMY Bilateral 2005   TOOTH EXTRACTION     Patient Active Problem List   Diagnosis Date Noted   Needs flu  shot 01/02/2024   Chronic bilateral low back pain with right-sided sciatica 09/29/2023   Hypertension associated with diabetes (HCC) 01/20/2023   Combined hyperlipidemia associated with type 2 diabetes mellitus (HCC) 01/20/2023   Intertrigo 11/01/2022   Stasis dermatitis of both legs 10/21/2022   Leg swelling 10/21/2022   Lymphedema of lower extremity 10/21/2022   Type 2 diabetes mellitus without complication, without long-term current use of insulin (HCC) 05/24/2022   Mixed hyperlipidemia 05/24/2022   Essential hypertension, benign 05/24/2022   Absolute anemia 05/24/2022   Gastroesophageal reflux disease without esophagitis 05/24/2022   Allergic rhinitis 05/24/2022   Asthma, allergic, moderate persistent, uncomplicated 05/24/2022   Bilateral chronic angle-closure glaucoma, indeterminate stage 05/24/2022   Airway hyperreactivity 08/29/2013   OSA on CPAP 08/29/2013    PCP: Fredy GORMAN Bathe, MD  REFERRING PROVIDER: same  REFERRING DIAG: lymphedema I89.0  THERAPY DIAG:  Lymphedema, not elsewhere classified  Rationale for Evaluation and Treatment: Rehabilitation  ONSET DATE: Pt reports onset of L ankle / leg swelling after injury 20-30 yrs ago. Then about 1 year ago legs started swelling and Pt having prickly sensation on shins and thighs bilaterally.   SUBJECTIVE:  SUBJECTIVE STATEMENT: Judith Arnold presents to OT for treatment of BLE lymphedema 2/2 suspected CVI and obesity. Pt is unaccompanied today. Pt denies lymphedema related pain in her legs. Pt brings both pairs of Circaid compression stocking alternatives to clinic today along with roll of webbing we recommended she get for making loops for to make CircAid straps easier to reach for donning and doffing.   (INITIAL EVAL 07/12/23 : Judith Arnold is referred to Occupational Therapy by Fredy GORMAN Bathe, MD,  for evaluation and treatment of BLE lymphedema.  Pt reports onset of L ankle and distal leg swelling about 30 years ago, then noticed RLE swelling and worsening LLE swelling about 1 year ago at same time she started having unusual sensation in legs. Pt denies previously having lymphedema treatment. Pt reports paternal aunt had leg swelling. Pt is unable to wear compression stockings because she is unable to reach feet and distal legs to don and doff them due to back and hip pain.  PERTINENT HISTORY: Relevant to lymphedema (OSA (denies using CPAP), BLE stasis dermatitis, HTN, DM 2 (Pt states she is prediabetic only), B glaucoma, Back and R hip OA  PAIN:  Are you having LE-related pain? Yes: NPRS scale: not rated/10 Pain location: medial proximal thighs w abduction, bilateral groin, buttocks, back, legs Pain description: like stickers, tired, sore Aggravating factors: standing, walking, dependent sitting Relieving factors: elevation  PRECAUTIONS: Fall and Other: LYMPHEDEMA precautions  (DM 2 and asthma)  WEIGHT BEARING RESTRICTIONS: No  FALLS:  Has patient fallen in last 6 months? yes Fell asleep at the computer and fell onto the floor [place] when storm door hit me  LIVING ENVIRONMENT: Lives with: alone Lives in: House/apartment Stairs: No;  Has following equipment at home: Environmental Consultant - 4 wheeled  OCCUPATION: retired scientist, research (life sciences)  LEISURE: reading and taking notes on current events and politics  HAND DOMINANCE: right   PRIOR LEVEL OF FUNCTION: Independent with household mobility with device, Requires assistive device for independence, Needs assistance with ADLs, Needs assistance with homemaking, and Needs assistance with gait  PATIENT GOALS: reduce swelling and keep it from getting worse  OBJECTIVE: Note: Objective measures were completed at Evaluation unless otherwise noted.  COGNITION:  Overall  cognitive status: impaired short term  memory   OBSERVATIONS / OTHER ASSESSMENTS: Mild, Stage II, BLE Lymphedema 2/2 suspected venous insufficiency and hx of obesity   SENSATION: Reports uncomfortable scratching, or sticker-like  sensation on anterior thighs   POSTURE: Raised walker handles 2 notches to increase upright spinal alignment when walking to limit falls risk. Handles need to be raised at least 1 hole more for safe upright posture when walking.  Upright sitting posture: head forward , shoulders forward and rounded- appears to be flexible kyphosis  LE ROM: WFL for ankles and knees. Pt reports limited hip abduction  LE MMT: [facility]. Presenting with generalized weakness and debility  LYMPHEDEMA ASSESSMENTS:   SURGERY TYPE/DATE: Non-cancer related limb swelling  HX INFECTIONS: positive for 1 episode cellulitis treated w antibiotics  Hx WOUNDS: denies   BLE COMPARATIVE LIMB VOLUMETRICS: INITIAL 07/19/23  LANDMARK RIGHT (dominant)  R LEG (A-D) 3525.1 ml  R THIGH (E-G) ml  R FULL LIMB (A-G) ml  Limb Volume differential (LVD)  %  Volume change since initial %  Volume change overall V  (Blank rows = not tested)  LANDMARK LEFT    L LEG (A-D) 3769.6 ml  L THIGH (E-G) ml  L FULL LIMB (A-G)  ml  Limb Volume differential (LVD)  Limb Volume Differential (LVD) measures 6.5%, L>R.  Volume change since initial %  Volume change overall %    RLE COMPARATIVE LIMB VOLUMETRICS: VISIT 9 08/15/23  LANDMARK RIGHT (dominant)  R LEG (A-D) 3549.4 ml  R THIGH (E-G) ml  R FULL LIMB (A-G) ml  Limb Volume differential (LVD)  %  Volume change since initial R LEG volume reduced by 5.84% since initially measured on 07/18/21  Volume change overall V  (Blank rows = not tested)  RLE COMPARATIVE LIMB VOLUMETRICS: VISIT 19 10/03/23  LANDMARK RIGHT (dominant)  R LEG (A-D) 3549.4 ml  R THIGH (E-G) ml  R FULL LIMB (A-G) ml  Limb Volume differential (LVD)  %  Volume change since  initial R LEG volume reduced by 14.7% since initially measured on 07/18/21  Volume change overall V    BLE COMPARATIVE LIMB VOLUMETRICS: 29 th Visit  LANDMARK RIGHT (dominant)  R LEG (A-D) 3438.1 ml  R THIGH (E-G) ml  R FULL LIMB (A-G) ml  Limb Volume differential (LVD)  %  Volume change since last measured on 10/03/23 (19 th visit) DECREASED by 0.67%. R Leg limb volume stable.  Volume change overall Overall R Leg volume reduction = 2.5%. Goal not me to date  (Blank rows = not tested)  LANDMARK LEFT    L LEG (A-D) 4174.4  ml  L THIGH (E-G) ml  L FULL LIMB (A-G) ml  Limb Volume differential (LVD)  .  Volume change since initial on 07/19/23 L LEG volume DECREASED by 10.7% since initially measured on 07/19/23  Volume change overall L LEG volume DECREASED by 10.7& since initially measured on 07/19/23  (Blank rows = not tested)  Mild, Stage  II, Bilateral Lower Extremity Lymphedema 2/2 CVI and Obesity  Skin  Description Hyper-Keratosis Peau' de Orange Shiny Tight Fibrotic/ Indurated Fatty Doughy Spongy/ boggy       R>L x  x   Skin dry Flaky WNL Macerated   mildly      Color Redness Varicosities Blanching Hemosiderin Stain Mottled   x     x   Odor Malodorous Yeast Fungal infection  WNL      x   Temperature Warm Cool wnl    x     Pitting Edema   1+ 2+ 3+ 4+ Non-pitting         x   Girth Symmetrical Asymmetrical                   Distribution    R>L toes to groin    Stemmer Sign Positive Negative   +    Lymphorrhea History Of:  Present Absent     x    Wounds History Of Present Absent Venous Arterial Pressure Sheer     x        Signs of Infection Redness Warmth Erythema Acute Swelling Drainage Borders                    Sensation Light Touch Deep pressure Hypersensitivity   Present Impaired Present Impaired Absent Impaired   x Tactile  x  x     Nails WNL   Fungus nail dystrophy   x     Hair Growth Symmetrical Asymmetrical   x    Skin Creases Base of  toes  Ankles   Base of Fingers knees       Abdominal pannus Thigh Lobules  Face/neck   x x  x      (Blank rows = not tested)    LYMPHEDEMA LIFE IMPACT SCALE (LLIS): TBA   TREATMENT THIS DATE:  Pt edu for lymphedema self care LLE/LLQ MLD BLE knee length Circaid leggings with Max A  PATIENT EDUCATION:  Continued Pt/ CG edu for lymphedema self care home program throughout session. Topics include outcome of comparative limb volumetrics- starting limb volume differentials (LVDs), technology and gradient techniques used for short stretch, multilayer compression wrapping, simple self-MLD, therapeutic lymphatic pumping exercises, skin/nail care, LE precautions, compression garment recommendations and specifications, wear and care schedule and compression garment donning / doffing w assistive devices. Discussed progress towards all OT goals since commencing CDT. Discussed detrimental impact of obesity on lower and upper extremity lymphedema over time. Reviewed OT goals for lymphedema care with Pt and discussed progress to date.  All questions answered to the Pt's satisfaction. Good return. Person educated: Patient  Education method: Explanation, Demonstration, and Handouts Education comprehension: verbalized understanding, returned demonstration, verbal cues required, and needs further education   HOME EXERCISE PROGRAM: BLE lymphatic pumping there ex using- 1 sets of 10 reps, each exercise in order-  1-2 x daily, bilaterally Simple self MLD 1 x daily Daily skin care to increase hydration, skin mobility and decrease infection risk- can be done during MLD During Intensive Phase CDT: Compression wraps 23/7 until limb volume reduction complete During Self-management Phase CDT: Fit with appropriate compression garments or alternatives. Consider BLE, knee length, Mediven, custom, CircAid , Velcro style leggings over soft cotton liners.  ASSESSMENT: CLINICAL IMPRESSION: Because Pt's paid helper who  assists her with donning and gauging compression on CircAid garments is not always reliable, OT created a mock  up of needed modifications to CircAids today by adding webbing loops to end of straps with hook Velcro sewn in place . These will be sewn down underneath applied Velcro to extend strap length. Pt is able to place the covered thumb hook on a dressing stick into this loop and pull them closed. She is then able to adjust compression by using the stick again to adjust the tension. These are only needed for the bottom 4 straps as Pt can reach and grasp the top 2 sets of straps. All components cut and pinned into place and Pt practiced to ensure this modification is a workable solution. Pt will have sewing done at a local seamstress shop and bring them back for further training PRN. Reapplied compression leggings with max assistance to expedite. Cont asper POC reducing OT frequency to follow up in 1 month.   Pt tolerated LLE/LLQ MLD well today without pain. Provided skin care to limit infection risk throughout MLD. Applied CircAid compression garment alternatives as established to bilateral legs at end of session with min A. Pt continues to improve her ability to gauge these garments. Pt in agreement with reduction in OT frequency to 1 x weekly for the next 4 weeks as she transitions to self management of chronic, progressive lymphedema. Cont as per POC.  (INITIAL EVAL 07/12/23: Judith Arnold is a 73 yo female presenting with chronic, progressive, BLE swelling 2/2 unknown etiology, which at first glance appears to be circulatory in nature due to skin coloring at distal legs where swelling is most invested. Pt is presently unable to wear traditional elastic compression garments due to difficulty donning and doffing them.  Chronic, progressive lymphedema with associated skin changes, including fibrosis, limits this patent's functional performance in all occupational domains, including functional  ambulation and  mobility, basic and instrumental ADLs (lower body dressing, LB bathing, fitting street shoes and LB clothing, driving, shopping, and home management. It also limits perform productive activities, leisure pursuits, and participation in social and community activities. BLE lymphedema contributes to elevated infection risk. Pt will benefit from skilled OT for Complete Decongestive Therapy (CDT), which  typically includes manual lymphatic drainage (MLD), skin care to limit' infection risk and increase skin excursion, lymphatic pumping exercise, and during the Intensive Phase multilayer, gradient compression bandaging to reduce limb volume. Once limb volume is reduced as much as possible, Pt is fitted with appropriate compression garments and/ or devices and transitions into the self management phase of care consisting of follow long support PRN.    Pt understands that her fair prognosis will become poor without daily assistance with compression wrapping since she is unable to reach feet and legs to apply them herself. Pt assures OT that she has a friend she believes is willing to assist her between OT sessions. )   OBJECTIVE IMPAIRMENTS: decreased standing and walking tolerance, activity tolerance, decreased balance, decreased knowledge of condition, decreased knowledge of use of DME, decreased mobility, decreased ROM, decreased strength, increased edema, impaired flexibility, impaired sensation, impaired UE functional use, impaired vision/reception, pain, and chronic, progressive, BLE swelling and associated skin changes, at increased infection risk   ACTIVITY LIMITATIONS: Functional ambulation and mobility (lifting, carrying, steps/stairs, transfers, squatting) , Basic and instrumental ADLs, leisure pursuits, productive activities, social participation  PARTICIPATION LIMITATIONS: meal prep, cleaning, laundry, driving, shopping, yard work, and    PERSONAL FACTORS: Age, Fitness, Past/current  experiences, Time since onset of injury/illness/exacerbation, 2+ co morbidities: OSA, HTN,  are also affecting patient's functional outcome.   REHAB POTENTIAL: Good  EVALUATION COMPLEXITY: Moderate   GOALS: Goals reviewed with patient? Yes  SHORT TERM GOALS: Target date: 4th OT Rx visit   Pt will demonstrate understanding of lymphedema precautions and prevention strategies with modified independence using a printed reference to identify at least 5 precautions and discussing how s/he may implement them into daily life to reduce risk of progression with extra time. Baseline:Max A Goal status: GOAL MET  2.  Pt will be able to apply multilayer, knee length, gradient, compression wraps to one leg at a time from toes to below knee with max caregiver assist to decrease limb volume, to limit infection risk, and to limit lymphedema progression.  Baseline: Dependent Goal status: GOAL MET  LONG TERM GOALS: Target date: 09/19/23  1.Given this patient's Intake score of TBA % on the Lymphedema Life Impact Scale (LLIS), patient will experience a reduction of at least 5 points in her perceived level of functional impairment resulting from lymphedema to improve functional performance and quality of life (QOL). Baseline: TBA % Goal status:  GOAL DEFERRED  2.  Pt will achieve at least a 10% volume reduction in B legs to return limb to typical size and shape, to limit infection risk and LE progression, to decrease pain, to improve function. Baseline: Dependent Goal status:GOAL MET R LEG volume reduced by 14.7%, and L LEG reduced by 10.7% since initially measured on 07/18/21  3.  Pt will obtain appropriate compression garments/devices and achieve modified independence (extra time + assistive devices) with donning/doffing to optimize limb volume reductions and limit LE progression over time. Baseline: Dependent Goal status: GOAL PARTIALLY MET. Pt obtained BLE, Velcro wrap style, knee length, CircAid elastic   garment alternatives , which are replacing multilayer compression wraps. Pt requires moderate assistance to don and  gauge these garment alternatives at most distal straps where she cannot reach them. Pt plans to take garments to an alterations shop o have webbing loops added to each strap so she can reach them easier.She is independent with doffing them.   4.During Intensive phase CDT, with max CG assistance, Pt will achieve at least 85% compliance with all adapted lymphedema self-care home program components, including daily skin care, compression wraps and /or garments, simple self MLD and lymphatic pumping therex to habituate LE self care protocol  into ADLs for optimal LE self-management over time. Baseline: Dependent Goal status: GOAL MET  PLAN:  OT FREQUENCY: reduce OT frequency 1 x weekly for 4 weeks to support transition to LE self-care  OT DURATION: 12 weeks and PRN  PLANNED INTERVENTIONS:compression bandaging, skin care,  97110-Therapeutic exercises, 97530- Therapeutic activity, 97535- Self Care, Manual lymph drainage, DME instructions, and fit with appropriate compression  PLAN FOR NEXT SESSION:  Manual therapy to LLE as established including MLD, skin care and compression Fit CircAid to LLE. Cont Pt edu for LE self-care   Zebedee Dec, MS, OTR/L, CLT-LANA 01/11/24 12:09 PM

## 2024-01-15 ENCOUNTER — Ambulatory Visit: Admitting: Occupational Therapy

## 2024-01-15 ENCOUNTER — Ambulatory Visit: Attending: Internal Medicine

## 2024-01-15 DIAGNOSIS — R278 Other lack of coordination: Secondary | ICD-10-CM | POA: Diagnosis not present

## 2024-01-15 DIAGNOSIS — R262 Difficulty in walking, not elsewhere classified: Secondary | ICD-10-CM | POA: Insufficient documentation

## 2024-01-15 DIAGNOSIS — M5459 Other low back pain: Secondary | ICD-10-CM | POA: Diagnosis not present

## 2024-01-15 DIAGNOSIS — M6281 Muscle weakness (generalized): Secondary | ICD-10-CM | POA: Insufficient documentation

## 2024-01-15 DIAGNOSIS — I890 Lymphedema, not elsewhere classified: Secondary | ICD-10-CM | POA: Insufficient documentation

## 2024-01-15 DIAGNOSIS — M25551 Pain in right hip: Secondary | ICD-10-CM | POA: Diagnosis not present

## 2024-01-15 NOTE — Therapy (Signed)
 OUTPATIENT PHYSICAL THERAPY THORACOLUMBAR TREATMENT/Physical Therapy Progress Note   Dates of reporting period  12/11/2023   to   01/15/2024   Patient Name: Judith Arnold MRN: [id number] DOB:02-Jan-1951, 73 y.o., female Today's Date: 01/15/2024  END OF SESSION:  PT End of Session - 01/15/24 1322     Visit Number 30    Number of Visits 44    Date for Recertification  03/04/24    Progress Note Due on Visit 40    PT Start Time 1315    PT Stop Time 1359    PT Time Calculation (min) 44 min    Equipment Utilized During Treatment Gait belt    Activity Tolerance Patient tolerated treatment well;Patient limited by pain    Behavior During Therapy WFL for tasks assessed/performed               Past Medical History:  Diagnosis Date   Allergy    Arthritis    lower back, left shoulder   Asthma    Carpal tunnel syndrome on both sides    Dental bridge present    top   Dental crown present    multiple   Family history of adverse reaction to anesthesia    Father and sister - PONV   GERD (gastroesophageal reflux disease)    Glaucoma    Hypertension    Leaky heart valve    Pre-diabetes    Sleep apnea    Past Surgical History:  Procedure Laterality Date   CATARACT EXTRACTION W/PHACO Right 11/16/2016   Procedure: CATARACT EXTRACTION PHACO AND INTRAOCULAR LENS PLACEMENT (IOC)  right;  Surgeon: Mittie Gaskin, MD;  Location: [facility];  Service: Ophthalmology;  Laterality: Right;  pre diabetic latex sensitivity sleep apnea   CATARACT EXTRACTION W/PHACO Left 01/18/2017   Procedure: CATARACT EXTRACTION PHACO AND INTRAOCULAR LENS PLACEMENT (IOC) LEFT DIABETIC;  Surgeon: Mittie Gaskin, MD;  Location: [facility];  Service: Ophthalmology;  Laterality: Left;   COLONOSCOPY     COLONOSCOPY WITH PROPOFOL  N/A 04/09/2018   Procedure: COLONOSCOPY WITH PROPOFOL ;  Surgeon: Viktoria Lamar DASEN, MD;  Location: [facility] END[facility];  Service: Endoscopy;  Laterality: N/A;    IRIDOTOMY / IRIDECTOMY Bilateral 2005   TOOTH EXTRACTION     Patient Active Problem List   Diagnosis Date Noted   Needs flu shot 01/02/2024   Chronic bilateral low back pain with right-sided sciatica 09/29/2023   Hypertension associated with diabetes (HCC) 01/20/2023   Combined hyperlipidemia associated with type 2 diabetes mellitus (HCC) 01/20/2023   Intertrigo 11/01/2022   Stasis dermatitis of both legs 10/21/2022   Leg swelling 10/21/2022   Lymphedema of lower extremity 10/21/2022   Type 2 diabetes mellitus without complication, without long-term current use of insulin (HCC) 05/24/2022   Mixed hyperlipidemia 05/24/2022   Essential hypertension, benign 05/24/2022   Absolute anemia 05/24/2022   Gastroesophageal reflux disease without esophagitis 05/24/2022   Allergic rhinitis 05/24/2022   Asthma, allergic, moderate persistent, uncomplicated 05/24/2022   Bilateral chronic angle-closure glaucoma, indeterminate stage 05/24/2022   Airway hyperreactivity 08/29/2013   OSA on CPAP 08/29/2013    PCP: Fernand Fredy RAMAN, MD  REFERRING PROVIDER: Fernand Fredy, RAMAN, MD  REFERRING DIAG:  Diagnosis  G89.29,M54.41 (ICD-10-CM) - Chronic bilateral low back pain with right-sided sciatica    Rationale for Evaluation and Treatment: Rehabilitation  THERAPY DIAG:  Other low back pain  Difficulty in walking, not elsewhere classified  Pain in right hip  Muscle weakness (generalized)  Other lack of coordination  ONSET  DATE: >3 years   SUBJECTIVE:                                                                                                                                                                       SUBJECTIVE STATEMENT:  Patient reports doing well-states had a quiet weekend. States her left LE is just a mess.       From EVAL: Pt is a pleasant 73 y/o female presenting to PT eval for chronic LBP with R side hip pain. She reports onset of LBP over 3 years ago. Primary site of pain  is mostly felt in R hip and lower spine. Pt reports hx of arthritis in lower back and R hip. She is now starting to feel arthritis pain in her knees as well. Reports RLE is actually her good leg, has some issues with pain into groin region of LLE. Pt states strength in legs has been impacted and that her general mobility is worse. Pt can only ambulate comfortably by taking weight off of lower back by using her 4WW. She states not using 4WW so much for balance, but for reducing pain with gait. She is unable to stand up fully due to pain. Pt says she used to be very fit (took take karate, gymnastics years ago). She is also seeing OT for BLE lymphedema management. She endorses difficulty getting out of chairs, difficulty with steps, she must use a ramp to get in and out of her home. Pt reports my brain does not retain what I'm talking about, can lose train of thought.  PERTINENT HISTORY:  PMH per chart includes arthritis low back and left shoulder, bilateral carpal tunnel, asthma, glaucoma, HTN, leaky heart valve, sleep apnea, pre-diabetes, being seen by lymphedema specialist for BLE lymphedema   PAIN:  Are you having pain? LBP and R hip pain  Lowest pain level: 0/10  With standing: 2/10  Worst: 5-6/10   PRECAUTIONS: LATEX ALLERGY per chart, fall  RED FLAGS: None  WEIGHT BEARING RESTRICTIONS: No  FALLS:  Has patient fallen in last 6 months? No  LIVING ENVIRONMENT: Uses ramp to get into home due to difficulty with steps, has 4WW  PLOF: Independent  PATIENT GOALS:  Says due to back pain she is unable to reach my feet, has difficulty getting her socks on without a grabber, wants to be more flexible and stronger to increase ease with these activities and mobility    OBJECTIVE:  Note: Objective measures were completed at Evaluation unless otherwise noted.  DIAGNOSTIC FINDINGS:  No recent pertinent imaging available in chart  PATIENT SURVEYS:  Modified Oswestry score:  34%  Interpretation of scores: Score Category Description  0-20% Minimal Disability The patient can cope with most living activities.  Usually no treatment is indicated apart from advice on lifting, sitting and exercise  21-40% Moderate Disability The patient experiences more pain and difficulty with sitting, lifting and standing. Travel and social life are more difficult and they may be disabled from work. Personal care, sexual activity and sleeping are not grossly affected, and the patient can usually be managed by conservative means  41-60% Severe Disability Pain remains the main problem in this group, but activities of daily living are affected. These patients require a detailed investigation  61-80% Crippled Back pain impinges on all aspects of the patient's life. Positive intervention is required  81-100% Bed-bound  These patients are either bed-bound or exaggerating their symptoms  Bluford FORBES Zoe DELENA Karon DELENA, et al. Surgery versus conservative management of stable thoracolumbar fracture: the PRESTO feasibility RCT. [place] ([place]): [employer]; 2021 Nov. [facility] Technology Assessment, No. 25.62.) Appendix 3, Oswestry Disability Index category descriptors. Available from: Findjewelers.cz  Minimally Clinically Important Difference (MCID) = 12.8%  COGNITION: Overall cognitive status: Within functional limits for tasks assessed, very pleasant pt can become tangential and requires redirection to activity    SENSATION: Intact to light touch BLE   MUSCLE LENGTH: deferred  POSTURE: increased thoracic kyphosis, rounded shoulders, unable to achieve full upright position due to pain, maintains increased hip and knee flexion when attempting to stand fully upright   PALPATION: deferred  Thoracolumbar AROM: Extension - unable to achieve neutral position, remains in significant flexion  Flexion - lacking at least 25%  Rotation - lacking at least 40%  bilat  and pain-limited    LOWER EXTREMITY MMT:    MMT Right eval Left eval  Hip flexion 4- 3*  Hip extension    Hip abduction 4+ 4+  Hip adduction 4+ 4+  Hip internal rotation    Hip external rotation    Knee flexion 4 3*  Knee extension 4+ 4+  Ankle dorsiflexion 4 4+  Ankle plantarflexion    Ankle inversion    Ankle eversion     (Blank rows = not tested) *=pain limited   LUMBAR SPECIAL TESTS:  deferred  FUNCTIONAL TESTS:  10 meter walk test: 0.63 m/s crouched posture, decreased hip extension, decreased step-length and heel strike, elevated R>L shoulder, heavy BUE weightbearing on 4WW  GAIT: Distance walked: 10MWT/clinic distances (see above) Assistive device utilized: Environmental Consultant - 4 wheeled Level of assistance: Modified independence Comments: gait mechanics impaired: decreased gait speed, crouched posture, decreased hip extension, decreased step-length and heel strike, elevated R>L shoulder, heavy BUE weightbearing on 4WW  10 Meter Walk Test: Patient instructed to walk 10 meters (32.8 ft) as quickly and as safely as possible at their normal speed x2 and at a fast speed x2. Time measured from 2 meter mark to 8 meter mark to accommodate ramp-up and ramp-down.  Normal speed 1: 0.66 m/s with rollator Normal speed 2: 0.66 m/s with rollator Normal speed 3: 0.68 m/s with upright 4WW Normal speed 4: 0.68 m/s with upright 4WW Average Normal speed: 0.67 m/s  Cut off scores: <0.4 m/s = household Ambulator, 0.4-0.8 m/s = limited community Ambulator, >0.8 m/s = community Ambulator, >1.2 m/s = crossing a street, <1.0 = increased fall risk MCID 0.05 m/s (small), 0.13 m/s (moderate), 0.06 m/s (significant)  (ANPTA Core Set of Outcome Measures for Adults with Neurologic Conditions, 2018)   MODIFIED OSWESTRY DISABILITY SCALE  Date: 12/11/2023 Score  Pain intensity 3 =  Pain medication provides me with moderate relief from pain.  2. Personal care (  washing, dressing, etc.) 1 =  I can take  care of myself normally, but it increases my pain.  3. Lifting 1 = I can lift heavy weights, but it causes increased pain.  4. Walking 4 = I can only walk with crutches or a cane.  5. Sitting 1 =  I can only sit in my favorite chair as long as I like.  6. Standing 3 =  Pain prevents me from standing more than 1/2 hour.  7. Sleeping 1 = I can sleep well only by using pain medication.  8. Social Life 2 = Pain prevents me from participating in more energetic activities (eg. sports, dancing).  9. Traveling 1 =  I can travel anywhere, but it increases my pain.  10. Employment/ Homemaking 2 = I can perform most of my homemaking/job duties, but pain prevents me from performing more physically stressful activities (eg, lifting, vacuuming).  Total 19/50 = 38%   Interpretation of scores: Score Category Description  0-20% Minimal Disability The patient can cope with most living activities. Usually no treatment is indicated apart from advice on lifting, sitting and exercise  21-40% Moderate Disability The patient experiences more pain and difficulty with sitting, lifting and standing. Travel and social life are more difficult and they may be disabled from work. Personal care, sexual activity and sleeping are not grossly affected, and the patient can usually be managed by conservative means  41-60% Severe Disability Pain remains the main problem in this group, but activities of daily living are affected. These patients require a detailed investigation  61-80% Crippled Back pain impinges on all aspects of the patient's life. Positive intervention is required  81-100% Bed-bound  These patients are either bed-bound or exaggerating their symptoms  Bluford FORBES Zoe DELENA Karon DELENA, et al. Surgery versus conservative management of stable thoracolumbar fracture: the PRESTO feasibility RCT. [place] ([place]): [employer]; 2021 Nov. [facility] Technology Assessment, No. 25.62.) Appendix 3, Oswestry Disability Index  category descriptors. Available from: Findjewelers.cz  Minimally Clinically Important Difference (MCID) = 12.8%   Pt performed 5 time sit<>stand (5xSTS): 21.78 sec without UE support sec (>15 sec indicates increased fall risk)      TREATMENT DATE: 01/15/24   Physical therapy treatment session today consisted of completing assessment of goals and administration of testing as demonstrated and documented in flow sheet, treatment, and goals section of this note. Addition treatments may be found below.    6 Min Walk Test:  Instructed patient to ambulate as quickly and as safely as possible for 6 minutes using LRAD. Patient was allowed to take standing rest breaks without stopping the test, but if the patient required a sitting rest break the clock would be stopped and the test would be over.  Results: 810 feet (247 meters) using a upright 4WW with CGA. Results indicate that the patient has reduced endurance with ambulation compared to age matched norms.  Age Matched Norms: 45-69 yo M: 109 F: 25, 53-79 yo M: 4 F: 471, 73-89 yo M: 417 F: 392 MDC: 58.21 meters (190.98 feet) or 50 meters (ANPTA Core Set of Outcome Measures for Adults with Neurologic Conditions, 2018)   Pt performed 5 time sit<>stand (5xSTS): 15.35 sec (>15 sec indicates increased fall risk)    Self care/home management:  Reviewed most recent HEP- Performing Seated lumbar flex x 15 Seated bicep curls 2# db  -BUE 2 x 10 reps  Sit to stand x 10 reps Seated shoulder horizontal abd 2# Dbs 2x10  PATIENT EDUCATION:  Education details: Exercise technique strategies                  Person educated: Patient Education method: Explanation Education comprehension: verbalized understanding  HOME EXERCISE PROGRAM:  Access Code: [email or web address] URL: [email or web address] Date: 01/08/2024 Prepared by: Reyes London  Exercises - Standing Bent Over Single Arm Scapular Row with Table  Support with PLB  - 3 x weekly - 3 sets - 10 reps - Seated Shoulder Row with Anchored Resistance  - 3 x weekly - 3 sets - 10 reps - Seated Shoulder Horizontal Abduction with Dumbbells - Thumbs Up  - 3 x weekly - 3 sets - 10 reps - Seated Bicep Curls Supinated with Dumbbells  - 3 x weekly - 3 sets - 10 reps - Seated Flexion Stretch  - 3 x weekly - 3 sets - 10 reps     Access Code: [email or web address] URL: [email or web address] Date: 12/28/2023 Prepared by: Reyes London  Exercises - Seated Shoulder Pendulum Exercise  - 1 x daily - 3 sets - 10 reps - Seated Shoulder Flexion Towel Slide at Table Top  - 1 x daily - 3 sets - 10 reps - Seated Shoulder Flexion  - 1 x daily - 3 sets - 10 reps     Access Code: [email or web address] URL: [email or web address] Date: 10/05/2023 Prepared by: Lonni Gainer  Exercises - Supine Bridge  - 3 x weekly - 3 sets - 10 reps - Clamshell  - 3 x weekly - 3 sets - 10 reps - Supine Lower Trunk Rotation  - 1 x daily - 3 sets - 30 sec hold - Seated Hamstring Stretch  - 1 x daily - 3 sets - 30 hold - Seated Clamshell  - 1 x daily - 7 x weekly - 2 sets - 20 reps - 3 sec hold - Supine Transversus Abdominis Bracing - Hands on Stomach  - 3 x weekly - 3 sets - 10 reps - Supine Gluteal Sets  - 3 x weekly - 3 sets - 10 reps  ASSESSMENT:  CLINICAL IMPRESSION: Patient making some steady progress as seen by 5x sit to stand and 10 meter walk test. Added an endurance goal now that patient is ambulating better using her upright 4WW. Patient's condition has the potential to improve in response to therapy. Maximum improvement is yet to be obtained. The anticipated improvement is attainable and reasonable in a generally predictable time.  Pt will continue to benefit from skilled therapy to address remaining deficits in order to improve overall QoL and return to PLOF.      OBJECTIVE IMPAIRMENTS: Abnormal gait, decreased activity tolerance, decreased balance,  decreased mobility, difficulty walking, decreased strength, hypomobility, increased edema, improper body mechanics, postural dysfunction, and pain.   ACTIVITY LIMITATIONS: carrying, lifting, bending, squatting, stairs, transfers, bed mobility, and locomotion level  PARTICIPATION LIMITATIONS: meal prep, cleaning, shopping, community activity, and yard work  PERSONAL FACTORS: Age, Fitness, Sex, Time since onset of injury/illness/exacerbation, and 3+ comorbidities: PMH per chart includes arthritis low back and left shoulder, bilateral carpal tunnel, asthma, glaucoma, HTN, leaky heart valve, sleep apnea, pre-diabetes, being seen by lymphedema specialist for BLE lymphedema  are also affecting patient's functional outcome.   REHAB POTENTIAL: Good  CLINICAL DECISION MAKING: Evolving/moderate complexity  EVALUATION COMPLEXITY: Moderate   GOALS: Goals reviewed with patient? Yes   SHORT TERM GOALS: Target date: 10/31/2023    Patient will be independent in home exercise program to improve strength/mobility for better functional independence with ADLs.  Baseline:to be initiated; 11/06/2023=Patient reports knowledgeable of HEP but not always able to perform and sometimes painful so has to modify.  Goal status: MET   LONG TERM GOALS: Target date: 03/04/2024    Patient will reduce modified Oswestry score to <20 as to demonstrate minimal disability with ADLs including improved sleeping tolerance, walking/sitting tolerance etc for better mobility with ADLs.  Baseline: 34; 11/06/2023= 32; 12/11/2023= 38% Goal status: ONGOING  2.  Patient (> 1 years old) will complete five times sit to stand test in < 15 seconds indicating an increased LE strength and improved balance. Baseline: 09/26/2023= 26.46 sec with heavy UE Support;  11/06/2023= 22.31 sec without UE Support; 12/11/2023 =21.78 sec without UE support  01/15/2024= 15.35 sec Goal status: PROGRESSING  3.  Patient will increase Berg Balance score by >  6 points to demonstrate decreased fall risk during functional activities Baseline: 09/26/2023= 31/56; 11/06/2023= 40/56 Goal status: MET  4.  Patient will increase 10 meter walk test to >1.7m/s as to improve gait speed for better community ambulation and to reduce fall risk. Baseline:  0.63 m/s with 4WW; 0.74 m/s; 12/11/2203= 0.66 m/s with 4WW and 0.68 m/s with upright 4WW 01/15/2204= 0.8 m/s with upright 4WW Goal status: PROGRESSING  5. Patient will transition from skilled PT services to independent with gym based program for long term plan for fitness and management of LE weakness/low back pain.   Baseline: Patient current engaged with outpatient PT services to assist with strength and low back pain management. 01/15/2024- Patient has learned some gym exercises but leaning toward more home program for greatest rehab potential- still ongoing adding activities as appropriate.   Goal status: ONGONG  6. Pt will increase by at least 34m (17ft) in order to demonstrate clinically significant improvement in cardiopulmonary endurance and community ambulation  Baseline: 01/15/2024= 810 feet with upright 4WW   Goal status: New       PLAN:  PT FREQUENCY: 1-2x/week  PT DURATION: 12 weeks  PLANNED INTERVENTIONS: [id number]- PT Re-evaluation, 97750- Physical Performance Testing, 97110-Therapeutic exercises, 97530- Therapeutic activity, [id number]- Neuromuscular re-education, 97535- Self Care, [id number]- Manual therapy, [id number]- Gait training, [phone]- Orthotic Initial, [phone]- Orthotic/Prosthetic subsequent, [phone]- Canalith repositioning, Patient/Family education, Balance training, Stair training, Taping, Joint mobilization, Spinal mobilization, Vestibular training, DME instructions, Cryotherapy, and Moist heat.  PLAN FOR NEXT SESSION:  Progress down to 1x/week so no 2nd visit this week. Progressive LE strengthening and mobility training. Continue to progress functional mobility in standing and seated if needed   Continue with some static and dynamic balance activities as Continue to progress HEP to activities that promote mobility with the least amount of pain as possible.   Chyrl London, PT Physical Therapist - [facility]  01/15/24, 4:47 PM

## 2024-01-17 ENCOUNTER — Encounter: Admitting: Occupational Therapy

## 2024-01-17 ENCOUNTER — Ambulatory Visit

## 2024-01-17 ENCOUNTER — Ambulatory Visit: Admitting: Occupational Therapy

## 2024-01-17 NOTE — Therapy (Signed)
 OUTPATIENT OCCUPATIONAL THERAPY TREATMENT NOTE  BILATERAL LOWER EXTREMITY LYMPHEDEMA  Patient Name: DEVANSHI Arnold MRN: [id number] DOB:05/06/1950, 73 y.o., female Today's Date: 01/17/2024  REPORTING PERIOD:   END OF SESSION:   OT End of Session - 01/17/24 1516     Visit Number 45    Number of Visits 72    Date for Recertification  01/16/24    OT Start Time 0215    OT Stop Time 0315    OT Time Calculation (min) 60 min    Activity Tolerance Patient tolerated treatment well;No increased pain    Behavior During Therapy WFL for tasks assessed/performed           Past Medical History:  Diagnosis Date   Allergy    Arthritis    lower back, left shoulder   Asthma    Carpal tunnel syndrome on both sides    Dental bridge present    top   Dental crown present    multiple   Family history of adverse reaction to anesthesia    Father and sister - PONV   GERD (gastroesophageal reflux disease)    Glaucoma    Hypertension    Leaky heart valve    Pre-diabetes    Sleep apnea    Past Surgical History:  Procedure Laterality Date   CATARACT EXTRACTION W/PHACO Right 11/16/2016   Procedure: CATARACT EXTRACTION PHACO AND INTRAOCULAR LENS PLACEMENT (IOC)  right;  Surgeon: Mittie Gaskin, MD;  Location: [facility];  Service: Ophthalmology;  Laterality: Right;  pre diabetic latex sensitivity sleep apnea   CATARACT EXTRACTION W/PHACO Left 01/18/2017   Procedure: CATARACT EXTRACTION PHACO AND INTRAOCULAR LENS PLACEMENT (IOC) LEFT DIABETIC;  Surgeon: Mittie Gaskin, MD;  Location: [facility];  Service: Ophthalmology;  Laterality: Left;   COLONOSCOPY     COLONOSCOPY WITH PROPOFOL  N/A 04/09/2018   Procedure: COLONOSCOPY WITH PROPOFOL ;  Surgeon: Viktoria Lamar DASEN, MD;  Location: [facility];  Service: Endoscopy;  Laterality: N/A;   IRIDOTOMY / IRIDECTOMY Bilateral 2005   TOOTH EXTRACTION     Patient Active Problem List   Diagnosis Date Noted   Needs flu  shot 01/02/2024   Chronic bilateral low back pain with right-sided sciatica 09/29/2023   Hypertension associated with diabetes (HCC) 01/20/2023   Combined hyperlipidemia associated with type 2 diabetes mellitus (HCC) 01/20/2023   Intertrigo 11/01/2022   Stasis dermatitis of both legs 10/21/2022   Leg swelling 10/21/2022   Lymphedema of lower extremity 10/21/2022   Type 2 diabetes mellitus without complication, without long-term current use of insulin (HCC) 05/24/2022   Mixed hyperlipidemia 05/24/2022   Essential hypertension, benign 05/24/2022   Absolute anemia 05/24/2022   Gastroesophageal reflux disease without esophagitis 05/24/2022   Allergic rhinitis 05/24/2022   Asthma, allergic, moderate persistent, uncomplicated 05/24/2022   Bilateral chronic angle-closure glaucoma, indeterminate stage 05/24/2022   Airway hyperreactivity 08/29/2013   OSA on CPAP 08/29/2013    PCP: Fredy GORMAN Bathe, MD  REFERRING PROVIDER: same  REFERRING DIAG: lymphedema I89.0  THERAPY DIAG:  Lymphedema, not elsewhere classified  Rationale for Evaluation and Treatment: Rehabilitation  ONSET DATE: Pt reports onset of L ankle / leg swelling after injury 20-30 yrs ago. Then about 1 year ago legs started swelling and Pt having prickly sensation on shins and thighs bilaterally.   SUBJECTIVE:  SUBJECTIVE STATEMENT: Judith Arnold presents to OT for treatment of BLE lymphedema 2/2 suspected CVI and obesity. Pt is unaccompanied today. Pt denies lymphedema related pain in her legs. Pt brings both pairs of Circaid compression stocking alternatives to clinic today along with roll of webbing we recommended she get for making loops for to make CircAid straps easier to reach for donning and doffing.   (INITIAL EVAL 07/12/23 : Judith Arnold is referred to Occupational Therapy by Fredy GORMAN Bathe, MD,  for evaluation and treatment of BLE lymphedema.  Pt reports onset of L ankle and distal leg swelling about 30 years ago, then noticed RLE swelling and worsening LLE swelling about 1 year ago at same time she started having unusual sensation in legs. Pt denies previously having lymphedema treatment. Pt reports paternal aunt had leg swelling. Pt is unable to wear compression stockings because she is unable to reach feet and distal legs to don and doff them due to back and hip pain.  PERTINENT HISTORY: Relevant to lymphedema (OSA (denies using CPAP), BLE stasis dermatitis, HTN, DM 2 (Pt states she is prediabetic only), B glaucoma, Back and R hip OA  PAIN:  Are you having LE-related pain? Yes: NPRS scale: not rated/10 Pain location: medial proximal thighs w abduction, bilateral groin, buttocks, back, legs Pain description: like stickers, tired, sore Aggravating factors: standing, walking, dependent sitting Relieving factors: elevation  PRECAUTIONS: Fall and Other: LYMPHEDEMA precautions  (DM 2 and asthma)  WEIGHT BEARING RESTRICTIONS: No  FALLS:  Has patient fallen in last 6 months? yes Fell asleep at the computer and fell onto the floor [place] when storm door hit me  LIVING ENVIRONMENT: Lives with: alone Lives in: House/apartment Stairs: No;  Has following equipment at home: Environmental Consultant - 4 wheeled  OCCUPATION: retired scientist, research (life sciences)  LEISURE: reading and taking notes on current events and politics  HAND DOMINANCE: right   PRIOR LEVEL OF FUNCTION: Independent with household mobility with device, Requires assistive device for independence, Needs assistance with ADLs, Needs assistance with homemaking, and Needs assistance with gait  PATIENT GOALS: reduce swelling and keep it from getting worse  OBJECTIVE: Note: Objective measures were completed at Evaluation unless otherwise noted.  COGNITION:  Overall  cognitive status: impaired short term  memory   OBSERVATIONS / OTHER ASSESSMENTS: Mild, Stage II, BLE Lymphedema 2/2 suspected venous insufficiency and hx of obesity   SENSATION: Reports uncomfortable scratching, or sticker-like  sensation on anterior thighs   POSTURE: Raised walker handles 2 notches to increase upright spinal alignment when walking to limit falls risk. Handles need to be raised at least 1 hole more for safe upright posture when walking.  Upright sitting posture: head forward , shoulders forward and rounded- appears to be flexible kyphosis  LE ROM: WFL for ankles and knees. Pt reports limited hip abduction  LE MMT: [facility] FOR LYMPHEDEMA CARE. Presenting with generalized weakness and debility  LYMPHEDEMA ASSESSMENTS:   SURGERY TYPE/DATE: Non-cancer related limb swelling  HX INFECTIONS: positive for 1 episode cellulitis treated w antibiotics  Hx WOUNDS: denies   BLE COMPARATIVE LIMB VOLUMETRICS: INITIAL 07/19/23  LANDMARK RIGHT (dominant)  R LEG (A-D) 3525.1 ml  R THIGH (E-G) ml  R FULL LIMB (A-G) ml  Limb Volume differential (LVD)  %  Volume change since initial %  Volume change overall V  (Blank rows = not tested)  LANDMARK LEFT    L LEG (A-D) 3769.6 ml  L THIGH (E-G) ml  L FULL LIMB (A-G)  ml  Limb Volume differential (LVD)  Limb Volume Differential (LVD) measures 6.5%, L>R.  Volume change since initial %  Volume change overall %    RLE COMPARATIVE LIMB VOLUMETRICS: VISIT 9 08/15/23  LANDMARK RIGHT (dominant)  R LEG (A-D) 3549.4 ml  R THIGH (E-G) ml  R FULL LIMB (A-G) ml  Limb Volume differential (LVD)  %  Volume change since initial R LEG volume reduced by 5.84% since initially measured on 07/18/21  Volume change overall V  (Blank rows = not tested)  RLE COMPARATIVE LIMB VOLUMETRICS: VISIT 19 10/03/23  LANDMARK RIGHT (dominant)  R LEG (A-D) 3549.4 ml  R THIGH (E-G) ml  R FULL LIMB (A-G) ml  Limb Volume differential (LVD)  %  Volume change since  initial R LEG volume reduced by 14.7% since initially measured on 07/18/21  Volume change overall V    BLE COMPARATIVE LIMB VOLUMETRICS: 29 th Visit  LANDMARK RIGHT (dominant)  R LEG (A-D) 3438.1 ml  R THIGH (E-G) ml  R FULL LIMB (A-G) ml  Limb Volume differential (LVD)  %  Volume change since last measured on 10/03/23 (19 th visit) DECREASED by 0.67%. R Leg limb volume stable.  Volume change overall Overall R Leg volume reduction = 2.5%. Goal not me to date  (Blank rows = not tested)  LANDMARK LEFT    L LEG (A-D) 4174.4  ml  L THIGH (E-G) ml  L FULL LIMB (A-G) ml  Limb Volume differential (LVD)  .  Volume change since initial on 07/19/23 L LEG volume DECREASED by 10.7% since initially measured on 07/19/23  Volume change overall L LEG volume DECREASED by 10.7& since initially measured on 07/19/23  (Blank rows = not tested)  Mild, Stage  II, Bilateral Lower Extremity Lymphedema 2/2 CVI and Obesity  Skin  Description Hyper-Keratosis Peau' de Orange Shiny Tight Fibrotic/ Indurated Fatty Doughy Spongy/ boggy       R>L x  x   Skin dry Flaky WNL Macerated   mildly      Color Redness Varicosities Blanching Hemosiderin Stain Mottled   x     x   Odor Malodorous Yeast Fungal infection  WNL      x   Temperature Warm Cool wnl    x     Pitting Edema   1+ 2+ 3+ 4+ Non-pitting         x   Girth Symmetrical Asymmetrical                   Distribution    R>L toes to groin    Stemmer Sign Positive Negative   +    Lymphorrhea History Of:  Present Absent     x    Wounds History Of Present Absent Venous Arterial Pressure Sheer     x        Signs of Infection Redness Warmth Erythema Acute Swelling Drainage Borders                    Sensation Light Touch Deep pressure Hypersensitivity   Present Impaired Present Impaired Absent Impaired   x Tactile  x  x     Nails WNL   Fungus nail dystrophy   x     Hair Growth Symmetrical Asymmetrical   x    Skin Creases Base of  toes  Ankles   Base of Fingers knees       Abdominal pannus Thigh Lobules  Face/neck   x x  x      (Blank rows = not tested)    LYMPHEDEMA LIFE IMPACT SCALE (LLIS): TBA   TREATMENT THIS DATE:  Pt edu for lymphedema self care LLE/LLQ MLD BLE knee length Circaid leggings with Max A  PATIENT EDUCATION:  Continued Pt/ CG edu for lymphedema self care home program throughout session. Topics include outcome of comparative limb volumetrics- starting limb volume differentials (LVDs), technology and gradient techniques used for short stretch, multilayer compression wrapping, simple self-MLD, therapeutic lymphatic pumping exercises, skin/nail care, LE precautions, compression garment recommendations and specifications, wear and care schedule and compression garment donning / doffing w assistive devices. Discussed progress towards all OT goals since commencing CDT. Discussed detrimental impact of obesity on lower and upper extremity lymphedema over time. Reviewed OT goals for lymphedema care with Pt and discussed progress to date.  All questions answered to the Pt's satisfaction. Good return. Person educated: Patient  Education method: Explanation, Demonstration, and Handouts Education comprehension: verbalized understanding, returned demonstration, verbal cues required, and needs further education   HOME EXERCISE PROGRAM: BLE lymphatic pumping there ex using- 1 sets of 10 reps, each exercise in order-  1-2 x daily, bilaterally Simple self MLD 1 x daily Daily skin care to increase hydration, skin mobility and decrease infection risk- can be done during MLD During Intensive Phase CDT: Compression wraps 23/7 until limb volume reduction complete During Self-management Phase CDT: Fit with appropriate compression garments or alternatives. Consider BLE, knee length, Mediven, custom, CircAid , Velcro style leggings over soft cotton liners.  ASSESSMENT: CLINICAL IMPRESSION: Because Pt's paid helper who  assists her with donning and gauging compression on CircAid garments is not always reliable, OT created a mock  up of needed modifications to CircAids today by adding webbing loops to end of straps with hook Velcro sewn in place . These will be sewn down underneath applied Velcro to extend strap length. Pt is able to place the covered thumb hook on a dressing stick into this loop and pull them closed. She is then able to adjust compression by using the stick again to adjust the tension. These are only needed for the bottom 4 straps as Pt can reach and grasp the top 2 sets of straps. All components cut and pinned into place and Pt practiced to ensure this modification is a workable solution. Pt will have sewing done at a local seamstress shop and bring them back for further training PRN. Reapplied compression leggings with max assistance to expedite. Cont asper POC reducing OT frequency to follow up in 1 month.   Pt tolerated LLE/LLQ MLD well today without pain. Provided skin care to limit infection risk throughout MLD. Applied CircAid compression garment alternatives as established to bilateral legs at end of session with min A. Pt continues to improve her ability to gauge these garments. Pt in agreement with reduction in OT frequency to 1 x weekly for the next 4 weeks as she transitions to self management of chronic, progressive lymphedema. Cont as per POC.  (INITIAL EVAL 07/12/23: Judith Arnold is a 73 yo female presenting with chronic, progressive, BLE swelling 2/2 unknown etiology, which at first glance appears to be circulatory in nature due to skin coloring at distal legs where swelling is most invested. Pt is presently unable to wear traditional elastic compression garments due to difficulty donning and doffing them.  Chronic, progressive lymphedema with associated skin changes, including fibrosis, limits this patent's functional performance in all occupational domains, including functional  ambulation and  mobility, basic and instrumental ADLs (lower body dressing, LB bathing, fitting street shoes and LB clothing, driving, shopping, and home management. It also limits perform productive activities, leisure pursuits, and participation in social and community activities. BLE lymphedema contributes to elevated infection risk. Pt will benefit from skilled OT for Complete Decongestive Therapy (CDT), which  typically includes manual lymphatic drainage (MLD), skin care to limit' infection risk and increase skin excursion, lymphatic pumping exercise, and during the Intensive Phase multilayer, gradient compression bandaging to reduce limb volume. Once limb volume is reduced as much as possible, Pt is fitted with appropriate compression garments and/ or devices and transitions into the self management phase of care consisting of follow long support PRN.    Pt understands that her fair prognosis will become poor without daily assistance with compression wrapping since she is unable to reach feet and legs to apply them herself. Pt assures OT that she has a friend she believes is willing to assist her between OT sessions. )   OBJECTIVE IMPAIRMENTS: decreased standing and walking tolerance, activity tolerance, decreased balance, decreased knowledge of condition, decreased knowledge of use of DME, decreased mobility, decreased ROM, decreased strength, increased edema, impaired flexibility, impaired sensation, impaired UE functional use, impaired vision/reception, pain, and chronic, progressive, BLE swelling and associated skin changes, at increased infection risk   ACTIVITY LIMITATIONS: Functional ambulation and mobility (lifting, carrying, steps/stairs, transfers, squatting) , Basic and instrumental ADLs, leisure pursuits, productive activities, social participation  PARTICIPATION LIMITATIONS: meal prep, cleaning, laundry, driving, shopping, yard work, and    PERSONAL FACTORS: Age, Fitness, Past/current  experiences, Time since onset of injury/illness/exacerbation, 2+ co morbidities: OSA, HTN,  are also affecting patient's functional outcome.   REHAB POTENTIAL: Good  EVALUATION COMPLEXITY: Moderate   GOALS: Goals reviewed with patient? Yes  SHORT TERM GOALS: Target date: 4th OT Rx visit   Pt will demonstrate understanding of lymphedema precautions and prevention strategies with modified independence using a printed reference to identify at least 5 precautions and discussing how s/he may implement them into daily life to reduce risk of progression with extra time. Baseline:Max A Goal status: GOAL MET  2.  Pt will be able to apply multilayer, knee length, gradient, compression wraps to one leg at a time from toes to below knee with max caregiver assist to decrease limb volume, to limit infection risk, and to limit lymphedema progression.  Baseline: Dependent Goal status: GOAL MET  LONG TERM GOALS: Target date: 09/19/23  1.Given this patient's Intake score of TBA % on the Lymphedema Life Impact Scale (LLIS), patient will experience a reduction of at least 5 points in her perceived level of functional impairment resulting from lymphedema to improve functional performance and quality of life (QOL). Baseline: TBA % Goal status:  GOAL DEFERRED  2.  Pt will achieve at least a 10% volume reduction in B legs to return limb to typical size and shape, to limit infection risk and LE progression, to decrease pain, to improve function. Baseline: Dependent Goal status:GOAL MET R LEG volume reduced by 14.7%, and L LEG reduced by 10.7% since initially measured on 07/18/21  3.  Pt will obtain appropriate compression garments/devices and achieve modified independence (extra time + assistive devices) with donning/doffing to optimize limb volume reductions and limit LE progression over time. Baseline: Dependent Goal status: GOAL PARTIALLY MET. Pt obtained BLE, Velcro wrap style, knee length, CircAid elastic   garment alternatives , which are replacing multilayer compression wraps. Pt requires moderate assistance to don and  gauge these garment alternatives at most distal straps where she cannot reach them. Pt plans to take garments to an alterations shop o have webbing loops added to each strap so she can reach them easier.She is independent with doffing them.   4.During Intensive phase CDT, with max CG assistance, Pt will achieve at least 85% compliance with all adapted lymphedema self-care home program components, including daily skin care, compression wraps and /or garments, simple self MLD and lymphatic pumping therex to habituate LE self care protocol  into ADLs for optimal LE self-management over time. Baseline: Dependent Goal status: GOAL MET  PLAN:  OT FREQUENCY: reduce OT frequency 1 x weekly for 4 weeks to support transition to LE self-care  OT DURATION: 12 weeks and PRN  PLANNED INTERVENTIONS:compression bandaging, skin care,  97110-Therapeutic exercises, 97530- Therapeutic activity, 97535- Self Care, Manual lymph drainage, DME instructions, and fit with appropriate compression  PLAN FOR NEXT SESSION:  Manual therapy to LLE as established including MLD, skin care and compression Fit CircAid to LLE. Cont Pt edu for LE self-care   Zebedee Dec, MS, OTR/L, CLT-LANA 01/17/24 3:17 PM

## 2024-01-22 ENCOUNTER — Ambulatory Visit: Admitting: Occupational Therapy

## 2024-01-22 ENCOUNTER — Ambulatory Visit

## 2024-01-22 DIAGNOSIS — M25551 Pain in right hip: Secondary | ICD-10-CM

## 2024-01-22 DIAGNOSIS — R278 Other lack of coordination: Secondary | ICD-10-CM | POA: Diagnosis not present

## 2024-01-22 DIAGNOSIS — M5459 Other low back pain: Secondary | ICD-10-CM

## 2024-01-22 DIAGNOSIS — M6281 Muscle weakness (generalized): Secondary | ICD-10-CM

## 2024-01-22 DIAGNOSIS — I890 Lymphedema, not elsewhere classified: Secondary | ICD-10-CM | POA: Diagnosis not present

## 2024-01-22 DIAGNOSIS — R262 Difficulty in walking, not elsewhere classified: Secondary | ICD-10-CM

## 2024-01-22 NOTE — Therapy (Signed)
 OUTPATIENT PHYSICAL THERAPY THORACOLUMBAR TREATMENT/Physical Therapy Progress Note   Dates of reporting period  12/11/2023   to   01/15/2024   Patient Name: Judith Arnold MRN: [id number] DOB:May 15, 1950, 73 y.o., female Today's Date: 01/22/2024  END OF SESSION:  PT End of Session - 01/22/24 1317     Visit Number 31    Number of Visits 44    Date for Recertification  03/04/24    Progress Note Due on Visit 40    PT Start Time 1315    PT Stop Time 1400    PT Time Calculation (min) 45 min    Equipment Utilized During Treatment Gait belt    Activity Tolerance Patient tolerated treatment well;Patient limited by pain    Behavior During Therapy WFL for tasks assessed/performed               Past Medical History:  Diagnosis Date   Allergy    Arthritis    lower back, left shoulder   Asthma    Carpal tunnel syndrome on both sides    Dental bridge present    top   Dental crown present    multiple   Family history of adverse reaction to anesthesia    Father and sister - PONV   GERD (gastroesophageal reflux disease)    Glaucoma    Hypertension    Leaky heart valve    Pre-diabetes    Sleep apnea    Past Surgical History:  Procedure Laterality Date   CATARACT EXTRACTION W/PHACO Right 11/16/2016   Procedure: CATARACT EXTRACTION PHACO AND INTRAOCULAR LENS PLACEMENT (IOC)  right;  Surgeon: Mittie Gaskin, MD;  Location: [facility];  Service: Ophthalmology;  Laterality: Right;  pre diabetic latex sensitivity sleep apnea   CATARACT EXTRACTION W/PHACO Left 01/18/2017   Procedure: CATARACT EXTRACTION PHACO AND INTRAOCULAR LENS PLACEMENT (IOC) LEFT DIABETIC;  Surgeon: Mittie Gaskin, MD;  Location: [facility];  Service: Ophthalmology;  Laterality: Left;   COLONOSCOPY     COLONOSCOPY WITH PROPOFOL  N/A 04/09/2018   Procedure: COLONOSCOPY WITH PROPOFOL ;  Surgeon: Viktoria Lamar DASEN, MD;  Location: [facility];  Service: Endoscopy;  Laterality: N/A;    IRIDOTOMY / IRIDECTOMY Bilateral 2005   TOOTH EXTRACTION     Patient Active Problem List   Diagnosis Date Noted   Needs flu shot 01/02/2024   Chronic bilateral low back pain with right-sided sciatica 09/29/2023   Hypertension associated with diabetes (HCC) 01/20/2023   Combined hyperlipidemia associated with type 2 diabetes mellitus (HCC) 01/20/2023   Intertrigo 11/01/2022   Stasis dermatitis of both legs 10/21/2022   Leg swelling 10/21/2022   Lymphedema of lower extremity 10/21/2022   Type 2 diabetes mellitus without complication, without long-term current use of insulin (HCC) 05/24/2022   Mixed hyperlipidemia 05/24/2022   Essential hypertension, benign 05/24/2022   Absolute anemia 05/24/2022   Gastroesophageal reflux disease without esophagitis 05/24/2022   Allergic rhinitis 05/24/2022   Asthma, allergic, moderate persistent, uncomplicated 05/24/2022   Bilateral chronic angle-closure glaucoma, indeterminate stage 05/24/2022   Airway hyperreactivity 08/29/2013   OSA on CPAP 08/29/2013    PCP: Fernand Fredy RAMAN, MD  REFERRING PROVIDER: Fernand Fredy, RAMAN, MD  REFERRING DIAG:  Diagnosis  G89.29,M54.41 (ICD-10-CM) - Chronic bilateral low back pain with right-sided sciatica    Rationale for Evaluation and Treatment: Rehabilitation  THERAPY DIAG:  Other low back pain  Difficulty in walking, not elsewhere classified  Pain in right hip  Muscle weakness (generalized)  Other lack of coordination  ONSET  DATE: >3 years   SUBJECTIVE:                                                                                                                                                                       SUBJECTIVE STATEMENT:  Patient reports doing okay and was able to do some autumn cleaning in the home - reaching down to floor to pick up clothes.    From EVAL: Pt is a pleasant 73 y/o female presenting to PT eval for chronic LBP with R side hip pain. She reports onset of LBP over 3  years ago. Primary site of pain is mostly felt in R hip and lower spine. Pt reports hx of arthritis in lower back and R hip. She is now starting to feel arthritis pain in her knees as well. Reports RLE is actually her good leg, has some issues with pain into groin region of LLE. Pt states strength in legs has been impacted and that her general mobility is worse. Pt can only ambulate comfortably by taking weight off of lower back by using her 4WW. She states not using 4WW so much for balance, but for reducing pain with gait. She is unable to stand up fully due to pain. Pt says she used to be very fit (took take karate, gymnastics years ago). She is also seeing OT for BLE lymphedema management. She endorses difficulty getting out of chairs, difficulty with steps, she must use a ramp to get in and out of her home. Pt reports my brain does not retain what I'm talking about, can lose train of thought.  PERTINENT HISTORY:  PMH per chart includes arthritis low back and left shoulder, bilateral carpal tunnel, asthma, glaucoma, HTN, leaky heart valve, sleep apnea, pre-diabetes, being seen by lymphedema specialist for BLE lymphedema   PAIN:  Are you having pain? LBP and R hip pain  Lowest pain level: 0/10  With standing: 2/10  Worst: 5-6/10   PRECAUTIONS: LATEX ALLERGY per chart, fall  RED FLAGS: None  WEIGHT BEARING RESTRICTIONS: No  FALLS:  Has patient fallen in last 6 months? No  LIVING ENVIRONMENT: Uses ramp to get into home due to difficulty with steps, has 4WW  PLOF: Independent  PATIENT GOALS:  Says due to back pain she is unable to reach my feet, has difficulty getting her socks on without a grabber, wants to be more flexible and stronger to increase ease with these activities and mobility    OBJECTIVE:  Note: Objective measures were completed at Evaluation unless otherwise noted.  DIAGNOSTIC FINDINGS:  No recent pertinent imaging available in chart  PATIENT SURVEYS:   Modified Oswestry score: 34%  Interpretation of scores: Score Category Description  0-20% Minimal Disability The patient can  cope with most living activities. Usually no treatment is indicated apart from advice on lifting, sitting and exercise  21-40% Moderate Disability The patient experiences more pain and difficulty with sitting, lifting and standing. Travel and social life are more difficult and they may be disabled from work. Personal care, sexual activity and sleeping are not grossly affected, and the patient can usually be managed by conservative means  41-60% Severe Disability Pain remains the main problem in this group, but activities of daily living are affected. These patients require a detailed investigation  61-80% Crippled Back pain impinges on all aspects of the patient's life. Positive intervention is required  81-100% Bed-bound  These patients are either bed-bound or exaggerating their symptoms  Bluford FORBES Zoe DELENA Karon DELENA, et al. Surgery versus conservative management of stable thoracolumbar fracture: the PRESTO feasibility RCT. [place] ([place]): [employer]; 2021 Nov. [facility] Technology Assessment, No. 25.62.) Appendix 3, Oswestry Disability Index category descriptors. Available from: [email or web address].cz  Minimally Clinically Important Difference (MCID) = 12.8%  COGNITION: Overall cognitive status: Within functional limits for tasks assessed, very pleasant pt can become tangential and requires redirection to activity    SENSATION: Intact to light touch BLE   MUSCLE LENGTH: deferred  POSTURE: increased thoracic kyphosis, rounded shoulders, unable to achieve full upright position due to pain, maintains increased hip and knee flexion when attempting to stand fully upright   PALPATION: deferred  Thoracolumbar AROM: Extension - unable to achieve neutral position, remains in significant flexion  Flexion - lacking at least 25%   Rotation - lacking at least 40% bilat  and pain-limited    LOWER EXTREMITY MMT:    MMT Right eval Left eval  Hip flexion 4- 3*  Hip extension    Hip abduction 4+ 4+  Hip adduction 4+ 4+  Hip internal rotation    Hip external rotation    Knee flexion 4 3*  Knee extension 4+ 4+  Ankle dorsiflexion 4 4+  Ankle plantarflexion    Ankle inversion    Ankle eversion     (Blank rows = not tested) *=pain limited   LUMBAR SPECIAL TESTS:  deferred  FUNCTIONAL TESTS:  10 meter walk test: 0.63 m/s crouched posture, decreased hip extension, decreased step-length and heel strike, elevated R>L shoulder, heavy BUE weightbearing on 4WW  GAIT: Distance walked: 10MWT/clinic distances (see above) Assistive device utilized: Environmental Consultant - 4 wheeled Level of assistance: Modified independence Comments: gait mechanics impaired: decreased gait speed, crouched posture, decreased hip extension, decreased step-length and heel strike, elevated R>L shoulder, heavy BUE weightbearing on 4WW  10 Meter Walk Test: Patient instructed to walk 10 meters (32.8 ft) as quickly and as safely as possible at their normal speed x2 and at a fast speed x2. Time measured from 2 meter mark to 8 meter mark to accommodate ramp-up and ramp-down.  Normal speed 1: 0.66 m/s with rollator Normal speed 2: 0.66 m/s with rollator Normal speed 3: 0.68 m/s with upright 4WW Normal speed 4: 0.68 m/s with upright 4WW Average Normal speed: 0.67 m/s  Cut off scores: <0.4 m/s = household Ambulator, 0.4-0.8 m/s = limited community Ambulator, >0.8 m/s = community Ambulator, >1.2 m/s = crossing a street, <1.0 = increased fall risk MCID 0.05 m/s (small), 0.13 m/s (moderate), 0.06 m/s (significant)  (ANPTA Core Set of Outcome Measures for Adults with Neurologic Conditions, 2018)   MODIFIED OSWESTRY DISABILITY SCALE  Date: 12/11/2023 Score  Pain intensity 3 =  Pain medication provides me with moderate relief from  pain.  2. Personal care  (washing, dressing, etc.) 1 =  I can take care of myself normally, but it increases my pain.  3. Lifting 1 = I can lift heavy weights, but it causes increased pain.  4. Walking 4 = I can only walk with crutches or a cane.  5. Sitting 1 =  I can only sit in my favorite chair as long as I like.  6. Standing 3 =  Pain prevents me from standing more than 1/2 hour.  7. Sleeping 1 = I can sleep well only by using pain medication.  8. Social Life 2 = Pain prevents me from participating in more energetic activities (eg. sports, dancing).  9. Traveling 1 =  I can travel anywhere, but it increases my pain.  10. Employment/ Homemaking 2 = I can perform most of my homemaking/job duties, but pain prevents me from performing more physically stressful activities (eg, lifting, vacuuming).  Total 19/50 = 38%   Interpretation of scores: Score Category Description  0-20% Minimal Disability The patient can cope with most living activities. Usually no treatment is indicated apart from advice on lifting, sitting and exercise  21-40% Moderate Disability The patient experiences more pain and difficulty with sitting, lifting and standing. Travel and social life are more difficult and they may be disabled from work. Personal care, sexual activity and sleeping are not grossly affected, and the patient can usually be managed by conservative means  41-60% Severe Disability Pain remains the main problem in this group, but activities of daily living are affected. These patients require a detailed investigation  61-80% Crippled Back pain impinges on all aspects of the patient's life. Positive intervention is required  81-100% Bed-bound  These patients are either bed-bound or exaggerating their symptoms  Bluford FORBES Zoe DELENA Karon DELENA, et al. Surgery versus conservative management of stable thoracolumbar fracture: the PRESTO feasibility RCT. [place]): [employer]; 2021 Nov. [facility] Technology Assessment, No.  25.62.) Appendix 3, Oswestry Disability Index category descriptors. Available from: [email or web address].cz  Minimally Clinically Important Difference (MCID) = 12.8%   Pt performed 5 time sit<>stand (5xSTS): 21.78 sec without UE support sec (>15 sec indicates increased fall risk)      TREATMENT DATE: 01/22/24   Nustep - interval training to promote LE strength/cardioresp endurance. PT sets up intervention, adjusts intensity throughout and monitors pt for response. Cuing for SPM/speed, pt maintains SPM in 60s  with pace partner setting. Total time = L1 with BUE/LE  Circuit style  Rd. 1 Step tap onto cone alt LE 10 reps of LAQ 10 reps of seated SLR up/over cone Gait x 160 feet with upright 4WW BORG= 11  Rd. 2 Step tap onto cone alt LE 10 reps of LAQ 10 reps of seated SLR up/over cone Gait x 160 feet  with upright 4WW BORG= 13  Rd. 3  Sit to stand x 10  Hip add squeeze with ball (hold 5 sec)   x 10  Ham curls with RTB x 10  Gait x 160 feet with upright 4WW  Rd. 4  Sit to stand x 10 - BORG = 1  Ham curls with RTB x 10    PATIENT EDUCATION:  Education details: Exercise technique strategies                  Person educated: Patient Education method: Explanation Education comprehension: verbalized understanding  HOME EXERCISE PROGRAM:  Access Code: [email or web address] URL: [email or web address] Date: 01/08/2024 Prepared by: Reyes London  Exercises - Standing Bent Over Single Arm Scapular Row with Table Support with PLB  - 3 x weekly - 3 sets - 10 reps - Seated Shoulder Row with Anchored Resistance  - 3 x weekly - 3 sets - 10 reps - Seated Shoulder Horizontal Abduction with Dumbbells - Thumbs Up  - 3 x weekly - 3 sets - 10 reps - Seated Bicep Curls Supinated with Dumbbells  - 3 x weekly - 3 sets - 10 reps - Seated Flexion Stretch  - 3 x weekly - 3 sets - 10 reps     Access Code: [email or web address] URL:  [email or web address] Date: 12/28/2023 Prepared by: Reyes London  Exercises - Seated Shoulder Pendulum Exercise  - 1 x daily - 3 sets - 10 reps - Seated Shoulder Flexion Towel Slide at Table Top  - 1 x daily - 3 sets - 10 reps - Seated Shoulder Flexion  - 1 x daily - 3 sets - 10 reps     Access Code: [email or web address] URL: [email or web address] Date: 10/05/2023 Prepared by: Lonni Gainer  Exercises - Supine Bridge  - 3 x weekly - 3 sets - 10 reps - Clamshell  - 3 x weekly - 3 sets - 10 reps - Supine Lower Trunk Rotation  - 1 x daily - 3 sets - 30 sec hold - Seated Hamstring Stretch  - 1 x daily - 3 sets - 30 hold - Seated Clamshell  - 1 x daily - 7 x weekly - 2 sets - 20 reps - 3 sec hold - Supine Transversus Abdominis Bracing - Hands on Stomach  - 3 x weekly - 3 sets - 10 reps - Supine Gluteal Sets  - 3 x weekly - 3 sets - 10 reps  ASSESSMENT:  CLINICAL IMPRESSION: Patient was well motivated today and able to participate in circuit style exercise program well- very little rest break- able to work through several rounds. She reported feeling really good and thankful to be able to do as much as she did.  Pt will continue to benefit from skilled therapy to address remaining deficits in order to improve overall QoL and return to PLOF.      OBJECTIVE IMPAIRMENTS: Abnormal gait, decreased activity tolerance, decreased balance, decreased mobility, difficulty walking, decreased strength, hypomobility, increased edema, improper body mechanics, postural dysfunction, and pain.   ACTIVITY LIMITATIONS: carrying, lifting, bending, squatting, stairs, transfers, bed mobility, and locomotion level  PARTICIPATION LIMITATIONS: meal prep, cleaning, shopping, community activity, and yard work  PERSONAL FACTORS: Age, Fitness, Sex, Time since onset of injury/illness/exacerbation, and 3+ comorbidities: PMH per chart includes arthritis low back and left shoulder, bilateral  carpal tunnel, asthma, glaucoma, HTN, leaky heart valve, sleep apnea, pre-diabetes, being seen by lymphedema specialist for BLE lymphedema  are also affecting patient's functional outcome.   REHAB POTENTIAL: Good  CLINICAL DECISION MAKING: Evolving/moderate complexity  EVALUATION COMPLEXITY: Moderate   GOALS: Goals reviewed with patient? Yes   SHORT TERM GOALS: Target date: 10/31/2023    Patient will be independent in home exercise program to improve strength/mobility for better functional independence with ADLs. Baseline:to be initiated; 11/06/2023=Patient reports knowledgeable of HEP but not always able to perform and sometimes painful so has to modify.  Goal status: MET   LONG TERM GOALS: Target date: 03/04/2024    Patient will reduce modified Oswestry score to <20 as to demonstrate minimal disability with ADLs including improved sleeping tolerance, walking/sitting tolerance etc for better mobility with ADLs.  Baseline: 34; 11/06/2023= 32; 12/11/2023= 38% Goal  status: ONGOING  2.  Patient (> 38 years old) will complete five times sit to stand test in < 15 seconds indicating an increased LE strength and improved balance. Baseline: 09/26/2023= 26.46 sec with heavy UE Support;  11/06/2023= 22.31 sec without UE Support; 12/11/2023 =21.78 sec without UE support  01/15/2024= 15.35 sec Goal status: PROGRESSING  3.  Patient will increase Berg Balance score by > 6 points to demonstrate decreased fall risk during functional activities Baseline: 09/26/2023= 31/56; 11/06/2023= 40/56 Goal status: MET  4.  Patient will increase 10 meter walk test to >1.42m/s as to improve gait speed for better community ambulation and to reduce fall risk. Baseline:  0.63 m/s with 4WW; 0.74 m/s; 12/11/2203= 0.66 m/s with 4WW and 0.68 m/s with upright 4WW 01/15/2204= 0.8 m/s with upright 4WW Goal status: PROGRESSING  5. Patient will transition from skilled PT services to independent with gym based program for long  term plan for fitness and management of LE weakness/low back pain.   Baseline: Patient current engaged with outpatient PT services to assist with strength and low back pain management. 01/15/2024- Patient has learned some gym exercises but leaning toward more home program for greatest rehab potential- still ongoing adding activities as appropriate.   Goal status: ONGONG  6. Pt will increase by at least 60m (11ft) in order to demonstrate clinically significant improvement in cardiopulmonary endurance and community ambulation  Baseline: 01/15/2024= 810 feet with upright 4WW   Goal status: New       PLAN:  PT FREQUENCY: 1-2x/week  PT DURATION: 12 weeks  PLANNED INTERVENTIONS: [id number]- PT Re-evaluation, 97750- Physical Performance Testing, 97110-Therapeutic exercises, 97530- Therapeutic activity, [id number]- Neuromuscular re-education, 97535- Self Care, [id number]- Manual therapy, [id number]- Gait training, [phone]- Orthotic Initial, [phone]- Orthotic/Prosthetic subsequent, [phone]- Canalith repositioning, Patient/Family education, Balance training, Stair training, Taping, Joint mobilization, Spinal mobilization, Vestibular training, DME instructions, Cryotherapy, and Moist heat.  PLAN FOR NEXT SESSION:   Progressive LE strengthening and mobility training. Continue to progress functional mobility in standing and seated if needed  Continue with some static and dynamic balance activities as Continue to progress HEP to activities that promote mobility with the least amount of pain as possible.   Chyrl London, PT Physical Therapist - [facility]  01/22/24, 2:11 PM

## 2024-01-24 ENCOUNTER — Ambulatory Visit

## 2024-01-24 ENCOUNTER — Encounter: Admitting: Occupational Therapy

## 2024-01-24 ENCOUNTER — Ambulatory Visit: Admitting: Occupational Therapy

## 2024-01-29 ENCOUNTER — Ambulatory Visit

## 2024-01-29 ENCOUNTER — Ambulatory Visit: Admitting: Occupational Therapy

## 2024-01-29 DIAGNOSIS — M25551 Pain in right hip: Secondary | ICD-10-CM

## 2024-01-29 DIAGNOSIS — R278 Other lack of coordination: Secondary | ICD-10-CM

## 2024-01-29 DIAGNOSIS — M5459 Other low back pain: Secondary | ICD-10-CM

## 2024-01-29 DIAGNOSIS — M6281 Muscle weakness (generalized): Secondary | ICD-10-CM

## 2024-01-29 DIAGNOSIS — I890 Lymphedema, not elsewhere classified: Secondary | ICD-10-CM | POA: Diagnosis not present

## 2024-01-29 DIAGNOSIS — R262 Difficulty in walking, not elsewhere classified: Secondary | ICD-10-CM | POA: Diagnosis not present

## 2024-01-29 NOTE — Therapy (Signed)
 OUTPATIENT PHYSICAL THERAPY THORACOLUMBAR TREATMENT  Patient Name: Judith Arnold MRN: [id number] DOB:09-04-1950, 73 y.o., female Today's Date: 01/29/2024  END OF SESSION:  PT End of Session - 01/29/24 1401     Visit Number 32    Number of Visits 44    Date for Recertification  03/04/24    Authorization Type [employer]= 16 visits from 10/1-12/30    Authorization - Visit Number 12    Authorization - Number of Visits 16    Progress Note Due on Visit 40    PT Start Time 1317    PT Stop Time 1400    PT Time Calculation (min) 43 min    Equipment Utilized During Treatment Gait belt    Activity Tolerance Patient tolerated treatment well;Patient limited by pain    Behavior During Therapy WFL for tasks assessed/performed                Past Medical History:  Diagnosis Date   Allergy    Arthritis    lower back, left shoulder   Asthma    Carpal tunnel syndrome on both sides    Dental bridge present    top   Dental crown present    multiple   Family history of adverse reaction to anesthesia    Father and sister - PONV   GERD (gastroesophageal reflux disease)    Glaucoma    Hypertension    Leaky heart valve    Pre-diabetes    Sleep apnea    Past Surgical History:  Procedure Laterality Date   CATARACT EXTRACTION W/PHACO Right 11/16/2016   Procedure: CATARACT EXTRACTION PHACO AND INTRAOCULAR LENS PLACEMENT (IOC)  right;  Surgeon: Mittie Gaskin, MD;  Location: [facility];  Service: Ophthalmology;  Laterality: Right;  pre diabetic latex sensitivity sleep apnea   CATARACT EXTRACTION W/PHACO Left 01/18/2017   Procedure: CATARACT EXTRACTION PHACO AND INTRAOCULAR LENS PLACEMENT (IOC) LEFT DIABETIC;  Surgeon: Mittie Gaskin, MD;  Location: [facility];  Service: Ophthalmology;  Laterality: Left;   COLONOSCOPY     COLONOSCOPY WITH PROPOFOL  N/A 04/09/2018   Procedure: COLONOSCOPY WITH PROPOFOL ;  Surgeon: Viktoria Lamar DASEN, MD;  Location: [facility]  ENDOSCOPY;  Service: Endoscopy;  Laterality: N/A;   IRIDOTOMY / IRIDECTOMY Bilateral 2005   TOOTH EXTRACTION     Patient Active Problem List   Diagnosis Date Noted   Needs flu shot 01/02/2024   Chronic bilateral low back pain with right-sided sciatica 09/29/2023   Hypertension associated with diabetes (HCC) 01/20/2023   Combined hyperlipidemia associated with type 2 diabetes mellitus (HCC) 01/20/2023   Intertrigo 11/01/2022   Stasis dermatitis of both legs 10/21/2022   Leg swelling 10/21/2022   Lymphedema of lower extremity 10/21/2022   Type 2 diabetes mellitus without complication, without long-term current use of insulin (HCC) 05/24/2022   Mixed hyperlipidemia 05/24/2022   Essential hypertension, benign 05/24/2022   Absolute anemia 05/24/2022   Gastroesophageal reflux disease without esophagitis 05/24/2022   Allergic rhinitis 05/24/2022   Asthma, allergic, moderate persistent, uncomplicated 05/24/2022   Bilateral chronic angle-closure glaucoma, indeterminate stage 05/24/2022   Airway hyperreactivity 08/29/2013   OSA on CPAP 08/29/2013    PCP: Fernand Fredy RAMAN, MD  REFERRING PROVIDER: Fernand Fredy, RAMAN, MD  REFERRING DIAG:  Diagnosis  G89.29,M54.41 (ICD-10-CM) - Chronic bilateral low back pain with right-sided sciatica    Rationale for Evaluation and Treatment: Rehabilitation  THERAPY DIAG:  Other low back pain  Pain in right hip  Difficulty in walking, not elsewhere classified  Muscle weakness (generalized)  Other lack of coordination  ONSET DATE: >3 years   SUBJECTIVE:                                                                                                                                                                       SUBJECTIVE STATEMENT:  Patient reports doing uneventful weekend  From EVAL: Pt is a pleasant 73 y/o female presenting to PT eval for chronic LBP with R side hip pain. She reports onset of LBP over 3 years ago. Primary site of pain is  mostly felt in R hip and lower spine. Pt reports hx of arthritis in lower back and R hip. She is now starting to feel arthritis pain in her knees as well. Reports RLE is actually her good leg, has some issues with pain into groin region of LLE. Pt states strength in legs has been impacted and that her general mobility is worse. Pt can only ambulate comfortably by taking weight off of lower back by using her 4WW. She states not using 4WW so much for balance, but for reducing pain with gait. She is unable to stand up fully due to pain. Pt says she used to be very fit (took take karate, gymnastics years ago). She is also seeing OT for BLE lymphedema management. She endorses difficulty getting out of chairs, difficulty with steps, she must use a ramp to get in and out of her home. Pt reports my brain does not retain what I'm talking about, can lose train of thought.  PERTINENT HISTORY:  PMH per chart includes arthritis low back and left shoulder, bilateral carpal tunnel, asthma, glaucoma, HTN, leaky heart valve, sleep apnea, pre-diabetes, being seen by lymphedema specialist for BLE lymphedema   PAIN:  Are you having pain? LBP and R hip pain  Lowest pain level: 0/10  With standing: 2/10  Worst: 5-6/10   PRECAUTIONS: LATEX ALLERGY per chart, fall  RED FLAGS: None  WEIGHT BEARING RESTRICTIONS: No  FALLS:  Has patient fallen in last 6 months? No  LIVING ENVIRONMENT: Uses ramp to get into home due to difficulty with steps, has 4WW  PLOF: Independent  PATIENT GOALS:  Says due to back pain she is unable to reach my feet, has difficulty getting her socks on without a grabber, wants to be more flexible and stronger to increase ease with these activities and mobility    OBJECTIVE:  Note: Objective measures were completed at Evaluation unless otherwise noted.  DIAGNOSTIC FINDINGS:  No recent pertinent imaging available in chart  PATIENT SURVEYS:  Modified Oswestry score:  34%  Interpretation of scores: Score Category Description  0-20% Minimal Disability The patient can cope with most living activities. Usually no treatment is indicated apart  from advice on lifting, sitting and exercise  21-40% Moderate Disability The patient experiences more pain and difficulty with sitting, lifting and standing. Travel and social life are more difficult and they may be disabled from work. Personal care, sexual activity and sleeping are not grossly affected, and the patient can usually be managed by conservative means  41-60% Severe Disability Pain remains the main problem in this group, but activities of daily living are affected. These patients require a detailed investigation  61-80% Crippled Back pain impinges on all aspects of the patient's life. Positive intervention is required  81-100% Bed-bound  These patients are either bed-bound or exaggerating their symptoms  Bluford FORBES Zoe DELENA Karon DELENA, et al. Surgery versus conservative management of stable thoracolumbar fracture: the PRESTO feasibility RCT. [place] ([place]): [employer]; 2021 Nov. [facility] Technology Assessment, No. 25.62.) Appendix 3, Oswestry Disability Index category descriptors. Available from: Findjewelers.cz  Minimally Clinically Important Difference (MCID) = 12.8%  COGNITION: Overall cognitive status: Within functional limits for tasks assessed, very pleasant pt can become tangential and requires redirection to activity    SENSATION: Intact to light touch BLE   MUSCLE LENGTH: deferred  POSTURE: increased thoracic kyphosis, rounded shoulders, unable to achieve full upright position due to pain, maintains increased hip and knee flexion when attempting to stand fully upright   PALPATION: deferred  Thoracolumbar AROM: Extension - unable to achieve neutral position, remains in significant flexion  Flexion - lacking at least 25%  Rotation - lacking at least 40%  bilat  and pain-limited    LOWER EXTREMITY MMT:    MMT Right eval Left eval  Hip flexion 4- 3*  Hip extension    Hip abduction 4+ 4+  Hip adduction 4+ 4+  Hip internal rotation    Hip external rotation    Knee flexion 4 3*  Knee extension 4+ 4+  Ankle dorsiflexion 4 4+  Ankle plantarflexion    Ankle inversion    Ankle eversion     (Blank rows = not tested) *=pain limited   LUMBAR SPECIAL TESTS:  deferred  FUNCTIONAL TESTS:  10 meter walk test: 0.63 m/s crouched posture, decreased hip extension, decreased step-length and heel strike, elevated R>L shoulder, heavy BUE weightbearing on 4WW  GAIT: Distance walked: 10MWT/clinic distances (see above) Assistive device utilized: Environmental Consultant - 4 wheeled Level of assistance: Modified independence Comments: gait mechanics impaired: decreased gait speed, crouched posture, decreased hip extension, decreased step-length and heel strike, elevated R>L shoulder, heavy BUE weightbearing on 4WW  10 Meter Walk Test: Patient instructed to walk 10 meters (32.8 ft) as quickly and as safely as possible at their normal speed x2 and at a fast speed x2. Time measured from 2 meter mark to 8 meter mark to accommodate ramp-up and ramp-down.  Normal speed 1: 0.66 m/s with rollator Normal speed 2: 0.66 m/s with rollator Normal speed 3: 0.68 m/s with upright 4WW Normal speed 4: 0.68 m/s with upright 4WW Average Normal speed: 0.67 m/s  Cut off scores: <0.4 m/s = household Ambulator, 0.4-0.8 m/s = limited community Ambulator, >0.8 m/s = community Ambulator, >1.2 m/s = crossing a street, <1.0 = increased fall risk MCID 0.05 m/s (small), 0.13 m/s (moderate), 0.06 m/s (significant)  (ANPTA Core Set of Outcome Measures for Adults with Neurologic Conditions, 2018)   MODIFIED OSWESTRY DISABILITY SCALE  Date: 12/11/2023 Score  Pain intensity 3 =  Pain medication provides me with moderate relief from pain.  2. Personal care (washing, dressing, etc.) 1 =  I can take  care of myself normally, but it increases my pain.  3. Lifting 1 = I can lift heavy weights, but it causes increased pain.  4. Walking 4 = I can only walk with crutches or a cane.  5. Sitting 1 =  I can only sit in my favorite chair as long as I like.  6. Standing 3 =  Pain prevents me from standing more than 1/2 hour.  7. Sleeping 1 = I can sleep well only by using pain medication.  8. Social Life 2 = Pain prevents me from participating in more energetic activities (eg. sports, dancing).  9. Traveling 1 =  I can travel anywhere, but it increases my pain.  10. Employment/ Homemaking 2 = I can perform most of my homemaking/job duties, but pain prevents me from performing more physically stressful activities (eg, lifting, vacuuming).  Total 19/50 = 38%   Interpretation of scores: Score Category Description  0-20% Minimal Disability The patient can cope with most living activities. Usually no treatment is indicated apart from advice on lifting, sitting and exercise  21-40% Moderate Disability The patient experiences more pain and difficulty with sitting, lifting and standing. Travel and social life are more difficult and they may be disabled from work. Personal care, sexual activity and sleeping are not grossly affected, and the patient can usually be managed by conservative means  41-60% Severe Disability Pain remains the main problem in this group, but activities of daily living are affected. These patients require a detailed investigation  61-80% Crippled Back pain impinges on all aspects of the patient's life. Positive intervention is required  81-100% Bed-bound  These patients are either bed-bound or exaggerating their symptoms  Bluford FORBES Zoe DELENA Karon DELENA, et al. Surgery versus conservative management of stable thoracolumbar fracture: the PRESTO feasibility RCT. [place] ([place]): [employer]; 2021 Nov. [facility] Technology Assessment, No. 25.62.) Appendix 3, Oswestry Disability Index  category descriptors. Available from: Findjewelers.cz  Minimally Clinically Important Difference (MCID) = 12.8%   Pt performed 5 time sit<>stand (5xSTS): 21.78 sec without UE support sec (>15 sec indicates increased fall risk)      TREATMENT DATE: 01/29/24   Self care/Home management:  Instructed in kitchen sink LE strengthening  -Stand hip march  - Stand calf raises  -Stand hip abd  -Stand hip ext  - Stand minisquats  - Stand ham curls  Isssued handout and extensive review ensuring she understood the pictures and instructions.   Gait in clinic x 450 feet with 4WW-   -  PATIENT EDUCATION:  Education details: Exercise technique strategies                  Person educated: Patient Education method: Explanation Education comprehension: verbalized understanding  HOME EXERCISE PROGRAM:  Access Code: [email or web address] URL: [email or web address] Date: 01/29/2024 Prepared by: Reyes London  Exercises - Standing March with Counter Support  - 2-3 x weekly - 3 sets - 10 reps - Heel Raises with Counter Support  - 2-3 x weekly - 3 sets - 10 reps - 2 sec hold - Standing Hip Extension with Counter Support  - 2-3 x weekly - 3 sets - 10 reps - Mini Squat with Counter Support  - 2-3 x weekly - 3 sets - 10 reps - Standing Hip Abduction with Counter Support  - 2-3 x weekly - 3 sets - 10 reps - Standing Knee Flexion with Counter Support  - 2-3 x weekly - 3 sets - 10 reps  Access Code: [email or web address] URL: [email or web address] Date: 01/08/2024 Prepared by: Reyes London  Exercises - Standing Bent Over Single Arm Scapular Row with Table Support with PLB  - 3 x weekly - 3 sets - 10 reps - Seated Shoulder Row with Anchored Resistance  - 3 x weekly - 3 sets - 10 reps - Seated Shoulder Horizontal Abduction with Dumbbells - Thumbs Up  - 3 x weekly - 3 sets - 10 reps - Seated Bicep Curls Supinated with Dumbbells  - 3 x weekly - 3  sets - 10 reps - Seated Flexion Stretch  - 3 x weekly - 3 sets - 10 reps     Access Code: [email or web address] URL: [email or web address] Date: 12/28/2023 Prepared by: Reyes London  Exercises - Seated Shoulder Pendulum Exercise  - 1 x daily - 3 sets - 10 reps - Seated Shoulder Flexion Towel Slide at Table Top  - 1 x daily - 3 sets - 10 reps - Seated Shoulder Flexion  - 1 x daily - 3 sets - 10 reps     Access Code: [email or web address] URL: [email or web address] Date: 10/05/2023 Prepared by: Lonni Gainer  Exercises - Supine Bridge  - 3 x weekly - 3 sets - 10 reps - Clamshell  - 3 x weekly - 3 sets - 10 reps - Supine Lower Trunk Rotation  - 1 x daily - 3 sets - 30 sec hold - Seated Hamstring Stretch  - 1 x daily - 3 sets - 30 hold - Seated Clamshell  - 1 x daily - 7 x weekly - 2 sets - 20 reps - 3 sec hold - Supine Transversus Abdominis Bracing - Hands on Stomach  - 3 x weekly - 3 sets - 10 reps - Supine Gluteal Sets  - 3 x weekly - 3 sets - 10 reps  ASSESSMENT:  CLINICAL IMPRESSION: Discussed remaining visit approved through insurance and extensively discussed current HEP for low back and arm strengthening. Today focused  on LE strength in home setting- Kitchen sink and patient was able to perform all standing with brief rest break yet extensive VC and tactile cues for correct technique.   Pt will continue to benefit from skilled therapy to address remaining deficits in order to improve overall QoL and return to PLOF.      OBJECTIVE IMPAIRMENTS: Abnormal gait, decreased activity tolerance, decreased balance, decreased mobility, difficulty walking, decreased strength, hypomobility, increased edema, improper body mechanics, postural dysfunction, and pain.   ACTIVITY LIMITATIONS: carrying, lifting, bending, squatting, stairs, transfers, bed mobility, and locomotion level  PARTICIPATION LIMITATIONS: meal prep, cleaning, shopping, community activity, and yard  work  PERSONAL FACTORS: Age, Fitness, Sex, Time since onset of injury/illness/exacerbation, and 3+ comorbidities: PMH per chart includes arthritis low back and left shoulder, bilateral carpal tunnel, asthma, glaucoma, HTN, leaky heart valve, sleep apnea, pre-diabetes, being seen by lymphedema specialist for BLE lymphedema  are also affecting patient's functional outcome.   REHAB POTENTIAL: Good  CLINICAL DECISION MAKING: Evolving/moderate complexity  EVALUATION COMPLEXITY: Moderate   GOALS: Goals reviewed with patient? Yes   SHORT TERM GOALS: Target date: 10/31/2023    Patient will be independent in home exercise program to improve strength/mobility for better functional independence with ADLs. Baseline:to be initiated; 11/06/2023=Patient reports knowledgeable of HEP but not always able to perform and sometimes painful so has to modify.  Goal status: MET   LONG TERM GOALS: Target date: 03/04/2024    Patient will reduce modified Oswestry score to <20 as to demonstrate minimal disability with ADLs  including improved sleeping tolerance, walking/sitting tolerance etc for better mobility with ADLs.  Baseline: 34; 11/06/2023= 32; 12/11/2023= 38% Goal status: ONGOING  2.  Patient (> 30 years old) will complete five times sit to stand test in < 15 seconds indicating an increased LE strength and improved balance. Baseline: 09/26/2023= 26.46 sec with heavy UE Support;  11/06/2023= 22.31 sec without UE Support; 12/11/2023 =21.78 sec without UE support  01/15/2024= 15.35 sec Goal status: PROGRESSING  3.  Patient will increase Berg Balance score by > 6 points to demonstrate decreased fall risk during functional activities Baseline: 09/26/2023= 31/56; 11/06/2023= 40/56 Goal status: MET  4.  Patient will increase 10 meter walk test to >1.78m/s as to improve gait speed for better community ambulation and to reduce fall risk. Baseline:  0.63 m/s with 4WW; 0.74 m/s; 12/11/2203= 0.66 m/s with 4WW and 0.68  m/s with upright 4WW 01/15/2204= 0.8 m/s with upright 4WW Goal status: PROGRESSING  5. Patient will transition from skilled PT services to independent with gym based program for long term plan for fitness and management of LE weakness/low back pain.   Baseline: Patient current engaged with outpatient PT services to assist with strength and low back pain management. 01/15/2024- Patient has learned some gym exercises but leaning toward more home program for greatest rehab potential- still ongoing adding activities as appropriate.   Goal status: ONGONG  6. Pt will increase by at least 19m (127ft) in order to demonstrate clinically significant improvement in cardiopulmonary endurance and community ambulation  Baseline: 01/15/2024= 810 feet with upright 4WW   Goal status: New       PLAN:  PT FREQUENCY: 1-2x/week  PT DURATION: 12 weeks  PLANNED INTERVENTIONS: 97164- PT Re-evaluation, 97750- Physical Performance Testing, 97110-Therapeutic exercises, 97530- Therapeutic activity, [id number]- Neuromuscular re-education, 97535- Self Care, [id number]- Manual therapy, [id number]- Gait training, [phone]- Orthotic Initial, [phone]- Orthotic/Prosthetic subsequent, [phone]- Canalith repositioning, Patient/Family education, Balance training, Stair training, Taping, Joint mobilization, Spinal mobilization, Vestibular training, DME instructions, Cryotherapy, and Moist heat.  PLAN FOR NEXT SESSION:   Progressive LE strengthening and mobility training. Continue with some static and dynamic balance activities as Continue to progress HEP to activities that promote mobility with the least amount of pain as possible.   Chyrl London, PT Physical Therapist - [facility]  01/29/24, 2:06 PM

## 2024-01-31 ENCOUNTER — Ambulatory Visit

## 2024-01-31 ENCOUNTER — Ambulatory Visit: Admitting: Occupational Therapy

## 2024-02-01 ENCOUNTER — Encounter: Admitting: Occupational Therapy

## 2024-02-01 ENCOUNTER — Ambulatory Visit

## 2024-02-01 ENCOUNTER — Ambulatory Visit: Admitting: Occupational Therapy

## 2024-02-03 NOTE — Progress Notes (Signed)
   02/03/2024  Patient ID: Judith Arnold, female   DOB: 09/11/1950, 73 y.o.   MRN: [id number]  Pharmacy Quality Measure Review  This patient is appearing on a report for being at risk of failing the KED measure this calendar year.   Last documented uACR/GFR documented on 01/02/24  Insurance report was not up to date, no further action needed at this time.

## 2024-02-05 ENCOUNTER — Ambulatory Visit

## 2024-02-05 ENCOUNTER — Ambulatory Visit: Admitting: Occupational Therapy

## 2024-02-07 ENCOUNTER — Ambulatory Visit: Admitting: Occupational Therapy

## 2024-02-07 ENCOUNTER — Ambulatory Visit

## 2024-02-11 NOTE — Therapy (Incomplete)
 OUTPATIENT PHYSICAL THERAPY THORACOLUMBAR TREATMENT  Patient Name: WYONIA FONTANELLA MRN: [id number] DOB:[birth year], 73 y.o., female Today's Date: 02/11/2024  END OF SESSION:          Past Medical History:  Diagnosis Date   Allergy    Arthritis    lower back, left shoulder   Asthma    Carpal tunnel syndrome on both sides    Dental bridge present    top   Dental crown present    multiple   Family history of adverse reaction to anesthesia    Father and sister - PONV   GERD (gastroesophageal reflux disease)    Glaucoma    Hypertension    Leaky heart valve    Pre-diabetes    Sleep apnea    Past Surgical History:  Procedure Laterality Date   CATARACT EXTRACTION W/PHACO Right 11/16/2016   Procedure: CATARACT EXTRACTION PHACO AND INTRAOCULAR LENS PLACEMENT (IOC)  right;  Surgeon: Mittie Gaskin, MD;  Location: [facility];  Service: Ophthalmology;  Laterality: Right;  pre diabetic latex sensitivity sleep apnea   CATARACT EXTRACTION W/PHACO Left 01/18/2017   Procedure: CATARACT EXTRACTION PHACO AND INTRAOCULAR LENS PLACEMENT (IOC) LEFT DIABETIC;  Surgeon: Mittie Gaskin, MD;  Location: [facility];  Service: Ophthalmology;  Laterality: Left;   COLONOSCOPY     COLONOSCOPY WITH PROPOFOL  N/A 04/09/2018   Procedure: COLONOSCOPY WITH PROPOFOL ;  Surgeon: Viktoria Lamar DASEN, MD;  Location: [facility];  Service: Endoscopy;  Laterality: N/A;   IRIDOTOMY / IRIDECTOMY Bilateral 2005   TOOTH EXTRACTION     Patient Active Problem List   Diagnosis Date Noted   Needs flu shot 01/02/2024   Chronic bilateral low back pain with right-sided sciatica 09/29/2023   Hypertension associated with diabetes (HCC) 01/20/2023   Combined hyperlipidemia associated with type 2 diabetes mellitus (HCC) 01/20/2023   Intertrigo 11/01/2022   Stasis dermatitis of both legs 10/21/2022   Leg swelling 10/21/2022   Lymphedema of lower extremity 10/21/2022   Type 2 diabetes  mellitus without complication, without long-term current use of insulin (HCC) 05/24/2022   Mixed hyperlipidemia 05/24/2022   Essential hypertension, benign 05/24/2022   Absolute anemia 05/24/2022   Gastroesophageal reflux disease without esophagitis 05/24/2022   Allergic rhinitis 05/24/2022   Asthma, allergic, moderate persistent, uncomplicated 05/24/2022   Bilateral chronic angle-closure glaucoma, indeterminate stage 05/24/2022   Airway hyperreactivity 08/29/2013   OSA on CPAP 08/29/2013    PCP: Fernand Fredy RAMAN, MD  REFERRING PROVIDER: Fernand Fredy, RAMAN, MD  REFERRING DIAG:  Diagnosis  G89.29,M54.41 (ICD-10-CM) - Chronic bilateral low back pain with right-sided sciatica    Rationale for Evaluation and Treatment: Rehabilitation  THERAPY DIAG:  No diagnosis found.  ONSET DATE: >3 years   SUBJECTIVE:  SUBJECTIVE STATEMENT:  Patient reports doing uneventful weekend  From EVAL: Pt is a pleasant 73 y/o female presenting to PT eval for chronic LBP with R side hip pain. She reports onset of LBP over 3 years ago. Primary site of pain is mostly felt in R hip and lower spine. Pt reports hx of arthritis in lower back and R hip. She is now starting to feel arthritis pain in her knees as well. Reports RLE is actually her good leg, has some issues with pain into groin region of LLE. Pt states strength in legs has been impacted and that her general mobility is worse. Pt can only ambulate comfortably by taking weight off of lower back by using her 4WW. She states not using 4WW so much for balance, but for reducing pain with gait. She is unable to stand up fully due to pain. Pt says she used to be very fit (took take karate, gymnastics years ago). She is also seeing OT for BLE lymphedema management. She endorses difficulty getting out  of chairs, difficulty with steps, she must use a ramp to get in and out of her home. Pt reports my brain does not retain what I'm talking about, can lose train of thought.  PERTINENT HISTORY:  PMH per chart includes arthritis low back and left shoulder, bilateral carpal tunnel, asthma, glaucoma, HTN, leaky heart valve, sleep apnea, pre-diabetes, being seen by lymphedema specialist for BLE lymphedema   PAIN:  Are you having pain? LBP and R hip pain  Lowest pain level: 0/10  With standing: 2/10  Worst: 5-6/10   PRECAUTIONS: LATEX ALLERGY per chart, fall  RED FLAGS: None  WEIGHT BEARING RESTRICTIONS: No  FALLS:  Has patient fallen in last 6 months? No  LIVING ENVIRONMENT: Uses ramp to get into home due to difficulty with steps, has 4WW  PLOF: Independent  PATIENT GOALS:  Says due to back pain she is unable to reach my feet, has difficulty getting her socks on without a grabber, wants to be more flexible and stronger to increase ease with these activities and mobility    OBJECTIVE:  Note: Objective measures were completed at Evaluation unless otherwise noted.  DIAGNOSTIC FINDINGS:  No recent pertinent imaging available in chart  PATIENT SURVEYS:  Modified Oswestry score: 34%  Interpretation of scores: Score Category Description  0-20% Minimal Disability The patient can cope with most living activities. Usually no treatment is indicated apart from advice on lifting, sitting and exercise  21-40% Moderate Disability The patient experiences more pain and difficulty with sitting, lifting and standing. Travel and social life are more difficult and they may be disabled from work. Personal care, sexual activity and sleeping are not grossly affected, and the patient can usually be managed by conservative means  41-60% Severe Disability Pain remains the main problem in this group, but activities of daily living are affected. These patients require a detailed investigation  61-80%  Crippled Back pain impinges on all aspects of the patient's life. Positive intervention is required  81-100% Bed-bound  These patients are either bed-bound or exaggerating their symptoms  Bluford FORBES Zoe DELENA Karon DELENA, et al. Surgery versus conservative management of stable thoracolumbar fracture: the PRESTO feasibility RCT. [place] ([place]): [employer]; 2021 Nov. [employer] Assessment, No. 25.62.) Appendix 3, Oswestry Disability Index category descriptors. Available from: Findjewelers.cz  Minimally Clinically Important Difference (MCID) = 12.8%  COGNITION: Overall cognitive status: Within functional limits for tasks assessed, very pleasant pt can become tangential and requires  redirection to activity    SENSATION: Intact to light touch BLE   MUSCLE LENGTH: deferred  POSTURE: increased thoracic kyphosis, rounded shoulders, unable to achieve full upright position due to pain, maintains increased hip and knee flexion when attempting to stand fully upright   PALPATION: deferred  Thoracolumbar AROM: Extension - unable to achieve neutral position, remains in significant flexion  Flexion - lacking at least 25%  Rotation - lacking at least 40% bilat  and pain-limited    LOWER EXTREMITY MMT:    MMT Right eval Left eval  Hip flexion 4- 3*  Hip extension    Hip abduction 4+ 4+  Hip adduction 4+ 4+  Hip internal rotation    Hip external rotation    Knee flexion 4 3*  Knee extension 4+ 4+  Ankle dorsiflexion 4 4+  Ankle plantarflexion    Ankle inversion    Ankle eversion     (Blank rows = not tested) *=pain limited   LUMBAR SPECIAL TESTS:  deferred  FUNCTIONAL TESTS:  10 meter walk test: 0.63 m/s crouched posture, decreased hip extension, decreased step-length and heel strike, elevated R>L shoulder, heavy BUE weightbearing on 4WW  GAIT: Distance walked: 10MWT/clinic distances (see above) Assistive device utilized: Environmental Consultant - 4  wheeled Level of assistance: Modified independence Comments: gait mechanics impaired: decreased gait speed, crouched posture, decreased hip extension, decreased step-length and heel strike, elevated R>L shoulder, heavy BUE weightbearing on 4WW  10 Meter Walk Test: Patient instructed to walk 10 meters (32.8 ft) as quickly and as safely as possible at their normal speed x2 and at a fast speed x2. Time measured from 2 meter mark to 8 meter mark to accommodate ramp-up and ramp-down.  Normal speed 1: 0.66 m/s with rollator Normal speed 2: 0.66 m/s with rollator Normal speed 3: 0.68 m/s with upright 4WW Normal speed 4: 0.68 m/s with upright 4WW Average Normal speed: 0.67 m/s  Cut off scores: <0.4 m/s = household Ambulator, 0.4-0.8 m/s = limited community Ambulator, >0.8 m/s = community Ambulator, >1.2 m/s = crossing a street, <1.0 = increased fall risk MCID 0.05 m/s (small), 0.13 m/s (moderate), 0.06 m/s (significant)  (ANPTA Core Set of Outcome Measures for Adults with Neurologic Conditions, 2018)   MODIFIED OSWESTRY DISABILITY SCALE  Date: 12/11/2023 Score  Pain intensity 3 =  Pain medication provides me with moderate relief from pain.  2. Personal care (washing, dressing, etc.) 1 =  I can take care of myself normally, but it increases my pain.  3. Lifting 1 = I can lift heavy weights, but it causes increased pain.  4. Walking 4 = I can only walk with crutches or a cane.  5. Sitting 1 =  I can only sit in my favorite chair as long as I like.  6. Standing 3 =  Pain prevents me from standing more than 1/2 hour.  7. Sleeping 1 = I can sleep well only by using pain medication.  8. Social Life 2 = Pain prevents me from participating in more energetic activities (eg. sports, dancing).  9. Traveling 1 =  I can travel anywhere, but it increases my pain.  10. Employment/ Homemaking 2 = I can perform most of my homemaking/job duties, but pain prevents me from performing more physically stressful  activities (eg, lifting, vacuuming).  Total 19/50 = 38%   Interpretation of scores: Score Category Description  0-20% Minimal Disability The patient can cope with most living activities. Usually no treatment is indicated apart from  advice on lifting, sitting and exercise  21-40% Moderate Disability The patient experiences more pain and difficulty with sitting, lifting and standing. Travel and social life are more difficult and they may be disabled from work. Personal care, sexual activity and sleeping are not grossly affected, and the patient can usually be managed by conservative means  41-60% Severe Disability Pain remains the main problem in this group, but activities of daily living are affected. These patients require a detailed investigation  61-80% Crippled Back pain impinges on all aspects of the patient's life. Positive intervention is required  81-100% Bed-bound  These patients are either bed-bound or exaggerating their symptoms  Bluford FORBES Zoe DELENA Karon DELENA, et al. Surgery versus conservative management of stable thoracolumbar fracture: the PRESTO feasibility RCT. [place] ([place]): [employer]; 2021 Nov. [facility] Technology Assessment, No. 25.62.) Appendix 3, Oswestry Disability Index category descriptors. Available from: Findjewelers.cz  Minimally Clinically Important Difference (MCID) = 12.8%   Pt performed 5 time sit<>stand (5xSTS): 21.78 sec without UE support sec (>15 sec indicates increased fall risk)      TREATMENT DATE: 02/11/24   Self care/Home management:  Instructed in kitchen sink LE strengthening  -Stand hip march  - Stand calf raises  -Stand hip abd  -Stand hip ext  - Stand minisquats  - Stand ham curls  Isssued handout and extensive review ensuring she understood the pictures and instructions.   Gait in clinic x 450 feet with 4WW-   -  PATIENT EDUCATION:  Education details: Exercise technique strategies                   Person educated: Patient Education method: Explanation Education comprehension: verbalized understanding  HOME EXERCISE PROGRAM:  Access Code: [email or web address] URL: [email or web address] Date: 01/29/2024 Prepared by: Reyes London  Exercises - Standing March with Counter Support  - 2-3 x weekly - 3 sets - 10 reps - Heel Raises with Counter Support  - 2-3 x weekly - 3 sets - 10 reps - 2 sec hold - Standing Hip Extension with Counter Support  - 2-3 x weekly - 3 sets - 10 reps - Mini Squat with Counter Support  - 2-3 x weekly - 3 sets - 10 reps - Standing Hip Abduction with Counter Support  - 2-3 x weekly - 3 sets - 10 reps - Standing Knee Flexion with Counter Support  - 2-3 x weekly - 3 sets - 10 reps      Access Code: [email or web address] URL: [email or web address] Date: 01/08/2024 Prepared by: Reyes London  Exercises - Standing Bent Over Single Arm Scapular Row with Table Support with PLB  - 3 x weekly - 3 sets - 10 reps - Seated Shoulder Row with Anchored Resistance  - 3 x weekly - 3 sets - 10 reps - Seated Shoulder Horizontal Abduction with Dumbbells - Thumbs Up  - 3 x weekly - 3 sets - 10 reps - Seated Bicep Curls Supinated with Dumbbells  - 3 x weekly - 3 sets - 10 reps - Seated Flexion Stretch  - 3 x weekly - 3 sets - 10 reps     Access Code: [email or web address] URL: [email or web address] Date: 12/28/2023 Prepared by: Reyes London  Exercises - Seated Shoulder Pendulum Exercise  - 1 x daily - 3 sets - 10 reps - Seated Shoulder Flexion Towel Slide at Table Top  - 1 x daily - 3 sets - 10 reps - Seated Shoulder Flexion  - 1 x daily - 3 sets - 10 reps  Access Code: [email or web address] URL: [email or web address] Date: 10/05/2023 Prepared by: Lonni Gainer  Exercises - Supine Bridge  - 3 x weekly - 3 sets - 10 reps - Clamshell  - 3 x weekly - 3 sets - 10 reps - Supine Lower Trunk Rotation  - 1 x daily - 3 sets - 30 sec  hold - Seated Hamstring Stretch  - 1 x daily - 3 sets - 30 hold - Seated Clamshell  - 1 x daily - 7 x weekly - 2 sets - 20 reps - 3 sec hold - Supine Transversus Abdominis Bracing - Hands on Stomach  - 3 x weekly - 3 sets - 10 reps - Supine Gluteal Sets  - 3 x weekly - 3 sets - 10 reps  ASSESSMENT:  CLINICAL IMPRESSION: *** Discussed remaining visit approved through insurance and extensively discussed current HEP for low back and arm strengthening. Today focused  on LE strength in home setting- Kitchen sink and patient was able to perform all standing with brief rest break yet extensive VC and tactile cues for correct technique.   Pt will continue to benefit from skilled therapy to address remaining deficits in order to improve overall QoL and return to PLOF.      OBJECTIVE IMPAIRMENTS: Abnormal gait, decreased activity tolerance, decreased balance, decreased mobility, difficulty walking, decreased strength, hypomobility, increased edema, improper body mechanics, postural dysfunction, and pain.   ACTIVITY LIMITATIONS: carrying, lifting, bending, squatting, stairs, transfers, bed mobility, and locomotion level  PARTICIPATION LIMITATIONS: meal prep, cleaning, shopping, community activity, and yard work  PERSONAL FACTORS: Age, Fitness, Sex, Time since onset of injury/illness/exacerbation, and 3+ comorbidities: PMH per chart includes arthritis low back and left shoulder, bilateral carpal tunnel, asthma, glaucoma, HTN, leaky heart valve, sleep apnea, pre-diabetes, being seen by lymphedema specialist for BLE lymphedema  are also affecting patient's functional outcome.   REHAB POTENTIAL: Good  CLINICAL DECISION MAKING: Evolving/moderate complexity  EVALUATION COMPLEXITY: Moderate   GOALS: Goals reviewed with patient? Yes   SHORT TERM GOALS: Target date: 10/31/2023    Patient will be independent in home exercise program to improve strength/mobility for better functional independence with  ADLs. Baseline:to be initiated; 11/06/2023=Patient reports knowledgeable of HEP but not always able to perform and sometimes painful so has to modify.  Goal status: MET   LONG TERM GOALS: Target date: 03/04/2024    Patient will reduce modified Oswestry score to <20 as to demonstrate minimal disability with ADLs including improved sleeping tolerance, walking/sitting tolerance etc for better mobility with ADLs.  Baseline: 34; 11/06/2023= 32; 12/11/2023= 38% Goal status: ONGOING  2.  Patient (> 39 years old) will complete five times sit to stand test in < 15 seconds indicating an increased LE strength and improved balance. Baseline: 09/26/2023= 26.46 sec with heavy UE Support;  11/06/2023= 22.31 sec without UE Support; 12/11/2023 =21.78 sec without UE support  01/15/2024= 15.35 sec Goal status: PROGRESSING  3.  Patient will increase Berg Balance score by > 6 points to demonstrate decreased fall risk during functional activities Baseline: 09/26/2023= 31/56; 11/06/2023= 40/56 Goal status: MET  4.  Patient will increase 10 meter walk test to >1.70m/s as to improve gait speed for better community ambulation and to reduce fall risk. Baseline:  0.63 m/s with 4WW; 0.74 m/s; 12/11/2203= 0.66 m/s with 4WW and 0.68 m/s with upright 4WW 01/15/2204= 0.8 m/s with upright 4WW Goal status: PROGRESSING  5. Patient will transition from skilled PT services to independent with gym based program for long term plan  for fitness and management of LE weakness/low back pain.   Baseline: Patient current engaged with outpatient PT services to assist with strength and low back pain management. 01/15/2024- Patient has learned some gym exercises but leaning toward more home program for greatest rehab potential- still ongoing adding activities as appropriate.   Goal status: ONGONG  6. Pt will increase by at least 58m (154ft) in order to demonstrate clinically significant improvement in cardiopulmonary endurance and community  ambulation  Baseline: 01/15/2024= 810 feet with upright 4WW   Goal status: New       PLAN:  PT FREQUENCY: 1-2x/week  PT DURATION: 12 weeks  PLANNED INTERVENTIONS: [id number]- PT Re-evaluation, 97750- Physical Performance Testing, 97110-Therapeutic exercises, 97530- Therapeutic activity, [id number]- Neuromuscular re-education, 97535- Self Care, [id number]- Manual therapy, [id number]- Gait training, [phone]- Orthotic Initial, [phone]- Orthotic/Prosthetic subsequent, [phone]- Canalith repositioning, Patient/Family education, Balance training, Stair training, Taping, Joint mobilization, Spinal mobilization, Vestibular training, DME instructions, Cryotherapy, and Moist heat.  PLAN FOR NEXT SESSION:  *** Progressive LE strengthening and mobility training. Continue with some static and dynamic balance activities as Continue to progress HEP to activities that promote mobility with the least amount of pain as possible.   Chyrl London, PT Physical Therapist - [facility]  02/11/24, 10:22 PM

## 2024-02-12 ENCOUNTER — Ambulatory Visit

## 2024-02-12 ENCOUNTER — Ambulatory Visit: Admitting: Occupational Therapy

## 2024-02-14 ENCOUNTER — Ambulatory Visit

## 2024-02-14 ENCOUNTER — Ambulatory Visit: Admitting: Occupational Therapy

## 2024-02-19 ENCOUNTER — Ambulatory Visit: Admitting: Occupational Therapy

## 2024-02-19 ENCOUNTER — Ambulatory Visit

## 2024-02-21 ENCOUNTER — Ambulatory Visit

## 2024-02-21 ENCOUNTER — Ambulatory Visit: Admitting: Occupational Therapy

## 2024-02-26 ENCOUNTER — Ambulatory Visit: Admitting: Occupational Therapy

## 2024-02-26 ENCOUNTER — Ambulatory Visit

## 2024-02-26 DIAGNOSIS — R278 Other lack of coordination: Secondary | ICD-10-CM | POA: Insufficient documentation

## 2024-02-26 DIAGNOSIS — R262 Difficulty in walking, not elsewhere classified: Secondary | ICD-10-CM | POA: Insufficient documentation

## 2024-02-26 DIAGNOSIS — M5459 Other low back pain: Secondary | ICD-10-CM | POA: Diagnosis present

## 2024-02-26 DIAGNOSIS — M6281 Muscle weakness (generalized): Secondary | ICD-10-CM | POA: Insufficient documentation

## 2024-02-26 DIAGNOSIS — M25551 Pain in right hip: Secondary | ICD-10-CM

## 2024-02-26 NOTE — Therapy (Signed)
 OUTPATIENT PHYSICAL THERAPY THORACOLUMBAR TREATMENT  Patient Name: Judith Arnold MRN: [id number] DOB:Feb 04, 1951, 73 y.o., female Today's Date: 02/26/2024  END OF SESSION:  PT End of Session - 02/26/24 1308     Visit Number 33    Number of Visits 44    Date for Recertification  03/04/24    Authorization Type [employer]= 16 visits from 10/1-12/30    Authorization - Visit Number 13    Authorization - Number of Visits 16    Progress Note Due on Visit 40    PT Start Time 1315    PT Stop Time 1400    PT Time Calculation (min) 45 min    Equipment Utilized During Treatment Gait belt    Activity Tolerance Patient tolerated treatment well;Patient limited by pain    Behavior During Therapy WFL for tasks assessed/performed                 Past Medical History:  Diagnosis Date   Allergy    Arthritis    lower back, left shoulder   Asthma    Carpal tunnel syndrome on both sides    Dental bridge present    top   Dental crown present    multiple   Family history of adverse reaction to anesthesia    Father and sister - PONV   GERD (gastroesophageal reflux disease)    Glaucoma    Hypertension    Leaky heart valve    Pre-diabetes    Sleep apnea    Past Surgical History:  Procedure Laterality Date   CATARACT EXTRACTION W/PHACO Right 11/16/2016   Procedure: CATARACT EXTRACTION PHACO AND INTRAOCULAR LENS PLACEMENT (IOC)  right;  Surgeon: Mittie Gaskin, MD;  Location: [facility];  Service: Ophthalmology;  Laterality: Right;  pre diabetic latex sensitivity sleep apnea   CATARACT EXTRACTION W/PHACO Left 01/18/2017   Procedure: CATARACT EXTRACTION PHACO AND INTRAOCULAR LENS PLACEMENT (IOC) LEFT DIABETIC;  Surgeon: Mittie Gaskin, MD;  Location: [facility];  Service: Ophthalmology;  Laterality: Left;   COLONOSCOPY     COLONOSCOPY WITH PROPOFOL  N/A 04/09/2018   Procedure: COLONOSCOPY WITH PROPOFOL ;  Surgeon: Viktoria Lamar DASEN, MD;  Location: [facility]  ENDOSCOPY;  Service: Endoscopy;  Laterality: N/A;   IRIDOTOMY / IRIDECTOMY Bilateral 2005   TOOTH EXTRACTION     Patient Active Problem List   Diagnosis Date Noted   Needs flu shot 01/02/2024   Chronic bilateral low back pain with right-sided sciatica 09/29/2023   Hypertension associated with diabetes (HCC) 01/20/2023   Combined hyperlipidemia associated with type 2 diabetes mellitus (HCC) 01/20/2023   Intertrigo 11/01/2022   Stasis dermatitis of both legs 10/21/2022   Leg swelling 10/21/2022   Lymphedema of lower extremity 10/21/2022   Type 2 diabetes mellitus without complication, without long-term current use of insulin (HCC) 05/24/2022   Mixed hyperlipidemia 05/24/2022   Essential hypertension, benign 05/24/2022   Absolute anemia 05/24/2022   Gastroesophageal reflux disease without esophagitis 05/24/2022   Allergic rhinitis 05/24/2022   Asthma, allergic, moderate persistent, uncomplicated 05/24/2022   Bilateral chronic angle-closure glaucoma, indeterminate stage 05/24/2022   Airway hyperreactivity 08/29/2013   OSA on CPAP 08/29/2013    PCP: Fernand Fredy RAMAN, MD  REFERRING PROVIDER: Fernand Fredy, RAMAN, MD  REFERRING DIAG:  Diagnosis  G89.29,M54.41 (ICD-10-CM) - Chronic bilateral low back pain with right-sided sciatica    Rationale for Evaluation and Treatment: Rehabilitation  THERAPY DIAG:  Other low back pain  Pain in right hip  Difficulty in walking, not elsewhere classified  Muscle weakness (generalized)  Other lack of coordination  ONSET DATE: >3 years   SUBJECTIVE:                                                                                                                                                                       SUBJECTIVE STATEMENT:  Today: Patient reports that her biggest issues this week is a sore left shoulder. Denies any falls since last visit.   From EVAL: Pt is a pleasant 73 y/o female presenting to PT eval for chronic LBP with R side  hip pain. She reports onset of LBP over 3 years ago. Primary site of pain is mostly felt in R hip and lower spine. Pt reports hx of arthritis in lower back and R hip. She is now starting to feel arthritis pain in her knees as well. Reports RLE is actually her good leg, has some issues with pain into groin region of LLE. Pt states strength in legs has been impacted and that her general mobility is worse. Pt can only ambulate comfortably by taking weight off of lower back by using her 4WW. She states not using 4WW so much for balance, but for reducing pain with gait. She is unable to stand up fully due to pain. Pt says she used to be very fit (took take karate, gymnastics years ago). She is also seeing OT for BLE lymphedema management. She endorses difficulty getting out of chairs, difficulty with steps, she must use a ramp to get in and out of her home. Pt reports my brain does not retain what I'm talking about, can lose train of thought.  PERTINENT HISTORY:  PMH per chart includes arthritis low back and left shoulder, bilateral carpal tunnel, asthma, glaucoma, HTN, leaky heart valve, sleep apnea, pre-diabetes, being seen by lymphedema specialist for BLE lymphedema   PAIN:  Are you having pain? LBP and R hip pain  Lowest pain level: 0/10  With standing: 2/10  Worst: 5-6/10   PRECAUTIONS: LATEX ALLERGY per chart, fall  RED FLAGS: None  WEIGHT BEARING RESTRICTIONS: No  FALLS:  Has patient fallen in last 6 months? No  LIVING ENVIRONMENT: Uses ramp to get into home due to difficulty with steps, has 4WW  PLOF: Independent  PATIENT GOALS:  Says due to back pain she is unable to reach my feet, has difficulty getting her socks on without a grabber, wants to be more flexible and stronger to increase ease with these activities and mobility    OBJECTIVE:  Note: Objective measures were completed at Evaluation unless otherwise noted.  DIAGNOSTIC FINDINGS:  No recent pertinent imaging  available in chart  PATIENT SURVEYS:  Modified Oswestry score: 34%  Interpretation of scores: Score Category Description  0-20%  Minimal Disability The patient can cope with most living activities. Usually no treatment is indicated apart from advice on lifting, sitting and exercise  21-40% Moderate Disability The patient experiences more pain and difficulty with sitting, lifting and standing. Travel and social life are more difficult and they may be disabled from work. Personal care, sexual activity and sleeping are not grossly affected, and the patient can usually be managed by conservative means  41-60% Severe Disability Pain remains the main problem in this group, but activities of daily living are affected. These patients require a detailed investigation  61-80% Crippled Back pain impinges on all aspects of the patients life. Positive intervention is required  81-100% Bed-bound  These patients are either bed-bound or exaggerating their symptoms  Bluford FORBES Zoe DELENA Karon DELENA, et al. Surgery versus conservative management of stable thoracolumbar fracture: the PRESTO feasibility RCT. [place] ([place]): [employer]; 2021 Nov. [facility] Technology Assessment, No. 25.62.) Appendix 3, Oswestry Disability Index category descriptors. Available from: Findjewelers.cz  Minimally Clinically Important Difference (MCID) = 12.8%  COGNITION: Overall cognitive status: Within functional limits for tasks assessed, very pleasant pt can become tangential and requires redirection to activity    SENSATION: Intact to light touch BLE   MUSCLE LENGTH: deferred  POSTURE: increased thoracic kyphosis, rounded shoulders, unable to achieve full upright position due to pain, maintains increased hip and knee flexion when attempting to stand fully upright   PALPATION: deferred  Thoracolumbar AROM: Extension - unable to achieve neutral position, remains in significant flexion   Flexion - lacking at least 25%  Rotation - lacking at least 40% bilat  and pain-limited    LOWER EXTREMITY MMT:    MMT Right eval Left eval  Hip flexion 4- 3*  Hip extension    Hip abduction 4+ 4+  Hip adduction 4+ 4+  Hip internal rotation    Hip external rotation    Knee flexion 4 3*  Knee extension 4+ 4+  Ankle dorsiflexion 4 4+  Ankle plantarflexion    Ankle inversion    Ankle eversion     (Blank rows = not tested) *=pain limited   LUMBAR SPECIAL TESTS:  deferred  FUNCTIONAL TESTS:  10 meter walk test: 0.63 m/s crouched posture, decreased hip extension, decreased step-length and heel strike, elevated R>L shoulder, heavy BUE weightbearing on 4WW  GAIT: Distance walked: 10MWT/clinic distances (see above) Assistive device utilized: Environmental Consultant - 4 wheeled Level of assistance: Modified independence Comments: gait mechanics impaired: decreased gait speed, crouched posture, decreased hip extension, decreased step-length and heel strike, elevated R>L shoulder, heavy BUE weightbearing on 4WW  10 Meter Walk Test: Patient instructed to walk 10 meters (32.8 ft) as quickly and as safely as possible at their normal speed x2 and at a fast speed x2. Time measured from 2 meter mark to 8 meter mark to accommodate ramp-up and ramp-down.  Normal speed 1: 0.66 m/s with rollator Normal speed 2: 0.66 m/s with rollator Normal speed 3: 0.68 m/s with upright 4WW Normal speed 4: 0.68 m/s with upright 4WW Average Normal speed: 0.67 m/s  Cut off scores: <0.4 m/s = household Ambulator, 0.4-0.8 m/s = limited community Ambulator, >0.8 m/s = community Ambulator, >1.2 m/s = crossing a street, <1.0 = increased fall risk MCID 0.05 m/s (small), 0.13 m/s (moderate), 0.06 m/s (significant)  (ANPTA Core Set of Outcome Measures for Adults with Neurologic Conditions, 2018)   MODIFIED OSWESTRY DISABILITY SCALE  Date: 12/11/2023 Score  Pain intensity 3 =  Pain medication provides  me with moderate relief from  pain.  2. Personal care (washing, dressing, etc.) 1 =  I can take care of myself normally, but it increases my pain.  3. Lifting 1 = I can lift heavy weights, but it causes increased pain.  4. Walking 4 = I can only walk with crutches or a cane.  5. Sitting 1 =  I can only sit in my favorite chair as long as I like.  6. Standing 3 =  Pain prevents me from standing more than 1/2 hour.  7. Sleeping 1 = I can sleep well only by using pain medication.  8. Social Life 2 = Pain prevents me from participating in more energetic activities (eg. sports, dancing).  9. Traveling 1 =  I can travel anywhere, but it increases my pain.  10. Employment/ Homemaking 2 = I can perform most of my homemaking/job duties, but pain prevents me from performing more physically stressful activities (eg, lifting, vacuuming).  Total 19/50 = 38%   Interpretation of scores: Score Category Description  0-20% Minimal Disability The patient can cope with most living activities. Usually no treatment is indicated apart from advice on lifting, sitting and exercise  21-40% Moderate Disability The patient experiences more pain and difficulty with sitting, lifting and standing. Travel and social life are more difficult and they may be disabled from work. Personal care, sexual activity and sleeping are not grossly affected, and the patient can usually be managed by conservative means  41-60% Severe Disability Pain remains the main problem in this group, but activities of daily living are affected. These patients require a detailed investigation  61-80% Crippled Back pain impinges on all aspects of the patients life. Positive intervention is required  81-100% Bed-bound  These patients are either bed-bound or exaggerating their symptoms  Bluford FORBES Zoe DELENA Karon DELENA, et al. Surgery versus conservative management of stable thoracolumbar fracture: the PRESTO feasibility RCT. [place] ([place]): [employer]; 2021 Nov. [facility]  Technology Assessment, No. 25.62.) Appendix 3, Oswestry Disability Index category descriptors. Available from: Findjewelers.cz  Minimally Clinically Important Difference (MCID) = 12.8%   Pt performed 5 time sit<>stand (5xSTS): 21.78 sec without UE support sec (>15 sec indicates increased fall risk)      TREATMENT DATE: 02/26/2024   Self care/Home management:   Review of ambulation daily - as able with arthritis- Discussed using her 4WW for more confined areas. Patient ambulated in clinic x 175 feet with 3# AW  Review of LE strengthening HEP from previous visit to ensure understanding: using 3# AW at support bar  -Stand hip march  - Stand calf raises  -Stand hip abd  -Stand hip ext  - Stand minisquats  - Stand ham curls  Re- Isssued handout and extensive review ensuring she understood the pictures and instructions.   6 Min Walk Test:  Instructed patient to ambulate as quickly and as safely as possible for 6 minutes using LRAD. Patient was allowed to take standing rest breaks without stopping the test, but if the patient required a sitting rest break the clock would be stopped and the test would be over.  Results: 900 feet  using a upright 4WW with independently . Results indicate that the patient has reduced endurance with ambulation compared to age matched norms.  Age Matched Norms: 85-69 yo M: 76 F: 35, 15-79 yo M: 57 F: 471, 29-89 yo M: 417 F: 392 MDC: 58.21 meters (190.98 feet) or 50 meters (ANPTA Core Set of Outcome Measures for  Adults with Neurologic Conditions, 2018) min Gait in clinic x 450 feet with 4WW-   10 Meter Walk Test: Patient instructed to walk 10 meters (32.8 ft) as quickly and as safely as possible at their normal speed x2 and at a fast speed x2. Time measured from 2 meter mark to 8 meter mark to accommodate ramp-up and ramp-down.  Normal speed 1: 0.77 m/s Normal speed 2: 0.76 m/s Average Normal speed: 0.77 m/s Cut off scores:  <0.4 m/s = household Ambulator, 0.4-0.8 m/s = limited community Ambulator, >0.8 m/s = community Ambulator, >1.2 m/s = crossing a street, <1.0 = increased fall risk MCID 0.05 m/s (small), 0.13 m/s (moderate), 0.06 m/s (significant)  (ANPTA Core Set of Outcome Measures for Adults with Neurologic Conditions, 2018)   -  PATIENT EDUCATION:  Education details: Exercise technique strategies                  Person educated: Patient Education method: Explanation Education comprehension: verbalized understanding  HOME EXERCISE PROGRAM:  Access Code: [email or web address] URL: [email or web address] Date: 01/29/2024 Prepared by: Reyes London  Exercises - Standing March with Counter Support  - 2-3 x weekly - 3 sets - 10 reps - Heel Raises with Counter Support  - 2-3 x weekly - 3 sets - 10 reps - 2 sec hold - Standing Hip Extension with Counter Support  - 2-3 x weekly - 3 sets - 10 reps - Mini Squat with Counter Support  - 2-3 x weekly - 3 sets - 10 reps - Standing Hip Abduction with Counter Support  - 2-3 x weekly - 3 sets - 10 reps - Standing Knee Flexion with Counter Support  - 2-3 x weekly - 3 sets - 10 reps      Access Code: [email or web address] URL: [email or web address] Date: 01/08/2024 Prepared by: Reyes London  Exercises - Standing Bent Over Single Arm Scapular Row with Table Support with PLB  - 3 x weekly - 3 sets - 10 reps - Seated Shoulder Row with Anchored Resistance  - 3 x weekly - 3 sets - 10 reps - Seated Shoulder Horizontal Abduction with Dumbbells - Thumbs Up  - 3 x weekly - 3 sets - 10 reps - Seated Bicep Curls Supinated with Dumbbells  - 3 x weekly - 3 sets - 10 reps - Seated Flexion Stretch  - 3 x weekly - 3 sets - 10 reps     Access Code: [email or web address] URL: [email or web address] Date: 12/28/2023 Prepared by: Reyes London  Exercises - Seated Shoulder Pendulum Exercise  - 1 x daily - 3 sets - 10 reps - Seated Shoulder Flexion  Towel Slide at Table Top  - 1 x daily - 3 sets - 10 reps - Seated Shoulder Flexion  - 1 x daily - 3 sets - 10 reps     Access Code: [email or web address] URL: [email or web address] Date: 10/05/2023 Prepared by: Lonni Gainer  Exercises - Supine Bridge  - 3 x weekly - 3 sets - 10 reps - Clamshell  - 3 x weekly - 3 sets - 10 reps - Supine Lower Trunk Rotation  - 1 x daily - 3 sets - 30 sec hold - Seated Hamstring Stretch  - 1 x daily - 3 sets - 30 hold - Seated Clamshell  - 1 x daily - 7 x weekly - 2 sets - 20 reps - 3 sec hold - Supine Transversus Abdominis Bracing - Hands on Stomach  - 3 x weekly - 3 sets - 10 reps - Supine  Gluteal Sets  - 3 x weekly - 3 sets - 10 reps  ASSESSMENT:  CLINICAL IMPRESSION: Reviewed existing HEP and patient admits she didn't do much during the holdiay break due to dealing with household issues.  Discussed plan to have her practice for 1 more week then return for last remaining appointment in current certification. She required min VC and visual demo to perform correct and required minimal rest. Retested her 6 min walk and she is making progress despite limited visits over last month. Pt will continue to benefit from skilled therapy to address remaining deficits in order to improve overall QoL and return to PLOF.      OBJECTIVE IMPAIRMENTS: Abnormal gait, decreased activity tolerance, decreased balance, decreased mobility, difficulty walking, decreased strength, hypomobility, increased edema, improper body mechanics, postural dysfunction, and pain.   ACTIVITY LIMITATIONS: carrying, lifting, bending, squatting, stairs, transfers, bed mobility, and locomotion level  PARTICIPATION LIMITATIONS: meal prep, cleaning, shopping, community activity, and yard work  PERSONAL FACTORS: Age, Fitness, Sex, Time since onset of injury/illness/exacerbation, and 3+ comorbidities: PMH per chart includes arthritis low back and left shoulder, bilateral carpal tunnel,  asthma, glaucoma, HTN, leaky heart valve, sleep apnea, pre-diabetes, being seen by lymphedema specialist for BLE lymphedema  are also affecting patient's functional outcome.   REHAB POTENTIAL: Good  CLINICAL DECISION MAKING: Evolving/moderate complexity  EVALUATION COMPLEXITY: Moderate   GOALS: Goals reviewed with patient? Yes   SHORT TERM GOALS: Target date: 10/31/2023    Patient will be independent in home exercise program to improve strength/mobility for better functional independence with ADLs. Baseline:to be initiated; 11/06/2023=Patient reports knowledgeable of HEP but not always able to perform and sometimes painful so has to modify.  Goal status: MET   LONG TERM GOALS: Target date: 03/04/2024    Patient will reduce modified Oswestry score to <20 as to demonstrate minimal disability with ADLs including improved sleeping tolerance, walking/sitting tolerance etc for better mobility with ADLs.  Baseline: 34; 11/06/2023= 32; 12/11/2023= 38% Goal status: ONGOING  2.  Patient (> 88 years old) will complete five times sit to stand test in < 15 seconds indicating an increased LE strength and improved balance. Baseline: 09/26/2023= 26.46 sec with heavy UE Support;  11/06/2023= 22.31 sec without UE Support; 12/11/2023 =21.78 sec without UE support  01/15/2024= 15.35 sec Goal status: PROGRESSING  3.  Patient will increase Berg Balance score by > 6 points to demonstrate decreased fall risk during functional activities Baseline: 09/26/2023= 31/56; 11/06/2023= 40/56 Goal status: MET  4.  Patient will increase 10 meter walk test to >1.40m/s as to improve gait speed for better community ambulation and to reduce fall risk. Baseline:  0.63 m/s with 4WW; 0.74 m/s; 12/11/2203= 0.66 m/s with 4WW and 0.68 m/s with upright 4WW 01/15/2024= 0.8 m/s with upright 4WW 02/26/24=0.77 m/s Goal status: PROGRESSING  5. Patient will transition from skilled PT services to independent with gym based program for  long term plan for fitness and management of LE weakness/low back pain.   Baseline: Patient current engaged with outpatient PT services to assist with strength and low back pain management. 01/15/2024- Patient has learned some gym exercises but leaning toward more home program for greatest rehab potential- still ongoing adding activities as appropriate.   Goal status: ONGONG  6. Pt will increase by at least 51m (132ft) in order to demonstrate clinically significant improvement in cardiopulmonary endurance and community ambulation  Baseline: 01/15/2024= 810 feet with upright 4WW; 02/26/2024= 900 feet    Goal status:  New       PLAN:  PT FREQUENCY: 1-2x/week  PT DURATION: 12 weeks  PLANNED INTERVENTIONS: 97164- PT Re-evaluation, 97750- Physical Performance Testing, 97110-Therapeutic exercises, 97530- Therapeutic activity, [id number]- Neuromuscular re-education, 97535- Self Care, [id number]- Manual therapy, [phone]- Gait training, [phone]- Orthotic Initial, [phone]- Orthotic/Prosthetic subsequent, [phone]- Canalith repositioning, Patient/Family education, Balance training, Stair training, Taping, Joint mobilization, Spinal mobilization, Vestibular training, DME instructions, Cryotherapy, and Moist heat.  PLAN FOR NEXT SESSION:  Review all remaining goals and finalize HEP next visit - Plan for discharge next visit.  Chyrl London, PT Physical Therapist - [facility]  02/26/2024, 3:21 PM

## 2024-02-28 ENCOUNTER — Ambulatory Visit

## 2024-02-28 ENCOUNTER — Ambulatory Visit: Admitting: Occupational Therapy

## 2024-03-01 ENCOUNTER — Ambulatory Visit: Admitting: Occupational Therapy

## 2024-03-01 ENCOUNTER — Ambulatory Visit

## 2024-03-01 DIAGNOSIS — M25551 Pain in right hip: Secondary | ICD-10-CM

## 2024-03-01 DIAGNOSIS — M5459 Other low back pain: Secondary | ICD-10-CM

## 2024-03-01 DIAGNOSIS — R278 Other lack of coordination: Secondary | ICD-10-CM

## 2024-03-01 DIAGNOSIS — M6281 Muscle weakness (generalized): Secondary | ICD-10-CM

## 2024-03-01 DIAGNOSIS — R262 Difficulty in walking, not elsewhere classified: Secondary | ICD-10-CM

## 2024-03-01 NOTE — Therapy (Signed)
 " OUTPATIENT PHYSICAL THERAPY THORACOLUMBAR TREATMENT/PT DISCHARGE SUMMARY  Patient Name: Judith Arnold MRN: [id number] DOB:05-03-1950, 73 y.o., female Today's Date: 03/02/2024  END OF SESSION:  PT End of Session - 03/01/24 0808     Visit Number 34    Number of Visits 44    Date for Recertification  03/04/24    Authorization Type [employer]= 16 visits from 10/1-12/30    Authorization - Number of Visits 16    Progress Note Due on Visit 40    PT Start Time 0804    PT Stop Time 0844    PT Time Calculation (min) 40 min    Equipment Utilized During Treatment Gait belt    Activity Tolerance Patient tolerated treatment well;Patient limited by pain    Behavior During Therapy WFL for tasks assessed/performed                 Past Medical History:  Diagnosis Date   Allergy    Arthritis    lower back, left shoulder   Asthma    Carpal tunnel syndrome on both sides    Dental bridge present    top   Dental crown present    multiple   Family history of adverse reaction to anesthesia    Father and sister - PONV   GERD (gastroesophageal reflux disease)    Glaucoma    Hypertension    Leaky heart valve    Pre-diabetes    Sleep apnea    Past Surgical History:  Procedure Laterality Date   CATARACT EXTRACTION W/PHACO Right 11/16/2016   Procedure: CATARACT EXTRACTION PHACO AND INTRAOCULAR LENS PLACEMENT (IOC)  right;  Surgeon: Mittie Gaskin, MD;  Location: [facility];  Service: Ophthalmology;  Laterality: Right;  pre diabetic latex sensitivity sleep apnea   CATARACT EXTRACTION W/PHACO Left 01/18/2017   Procedure: CATARACT EXTRACTION PHACO AND INTRAOCULAR LENS PLACEMENT (IOC) LEFT DIABETIC;  Surgeon: Mittie Gaskin, MD;  Location: [facility];  Service: Ophthalmology;  Laterality: Left;   COLONOSCOPY     COLONOSCOPY WITH PROPOFOL  N/A 04/09/2018   Procedure: COLONOSCOPY WITH PROPOFOL ;  Surgeon: Viktoria Lamar DASEN, MD;  Location: [facility];  Service:  Endoscopy;  Laterality: N/A;   IRIDOTOMY / IRIDECTOMY Bilateral 2005   TOOTH EXTRACTION     Patient Active Problem List   Diagnosis Date Noted   Needs flu shot 01/02/2024   Chronic bilateral low back pain with right-sided sciatica 09/29/2023   Hypertension associated with diabetes (HCC) 01/20/2023   Combined hyperlipidemia associated with type 2 diabetes mellitus (HCC) 01/20/2023   Intertrigo 11/01/2022   Stasis dermatitis of both legs 10/21/2022   Leg swelling 10/21/2022   Lymphedema of lower extremity 10/21/2022   Type 2 diabetes mellitus without complication, without long-term current use of insulin (HCC) 05/24/2022   Mixed hyperlipidemia 05/24/2022   Essential hypertension, benign 05/24/2022   Absolute anemia 05/24/2022   Gastroesophageal reflux disease without esophagitis 05/24/2022   Allergic rhinitis 05/24/2022   Asthma, allergic, moderate persistent, uncomplicated 05/24/2022   Bilateral chronic angle-closure glaucoma, indeterminate stage 05/24/2022   Airway hyperreactivity 08/29/2013   OSA on CPAP 08/29/2013    PCP: Fernand Fredy RAMAN, MD  REFERRING PROVIDER: Fernand Fredy, RAMAN, MD  REFERRING DIAG:  Diagnosis  G89.29,M54.41 (ICD-10-CM) - Chronic bilateral low back pain with right-sided sciatica    Rationale for Evaluation and Treatment: Rehabilitation  THERAPY DIAG:  Other low back pain  Pain in right hip  Difficulty in walking, not elsewhere classified  Muscle weakness (generalized)  Other lack of coordination  ONSET DATE: >3 years   SUBJECTIVE:                                                                                                                                                                       SUBJECTIVE STATEMENT:  Today: Patient reports she is doing okay overall. She states she is compliant with her home program and walking. She denies any falls and reports pain is managed with advil and exercises.     From EVAL: Pt is a pleasant 73 y/o  female presenting to PT eval for chronic LBP with R side hip pain. She reports onset of LBP over 3 years ago. Primary site of pain is mostly felt in R hip and lower spine. Pt reports hx of arthritis in lower back and R hip. She is now starting to feel arthritis pain in her knees as well. Reports RLE is actually her good leg, has some issues with pain into groin region of LLE. Pt states strength in legs has been impacted and that her general mobility is worse. Pt can only ambulate comfortably by taking weight off of lower back by using her 4WW. She states not using 4WW so much for balance, but for reducing pain with gait. She is unable to stand up fully due to pain. Pt says she used to be very fit (took take karate, gymnastics years ago). She is also seeing OT for BLE lymphedema management. She endorses difficulty getting out of chairs, difficulty with steps, she must use a ramp to get in and out of her home. Pt reports my brain does not retain what I'm talking about, can lose train of thought.  PERTINENT HISTORY:  PMH per chart includes arthritis low back and left shoulder, bilateral carpal tunnel, asthma, glaucoma, HTN, leaky heart valve, sleep apnea, pre-diabetes, being seen by lymphedema specialist for BLE lymphedema   PAIN:  Are you having pain? LBP and R hip pain  Lowest pain level: 0/10  With standing: 2/10  Worst: 5-6/10   PRECAUTIONS: LATEX ALLERGY per chart, fall  RED FLAGS: None  WEIGHT BEARING RESTRICTIONS: No  FALLS:  Has patient fallen in last 6 months? No  LIVING ENVIRONMENT: Uses ramp to get into home due to difficulty with steps, has 4WW  PLOF: Independent  PATIENT GOALS:  Says due to back pain she is unable to reach my feet, has difficulty getting her socks on without a grabber, wants to be more flexible and stronger to increase ease with these activities and mobility    OBJECTIVE:  Note: Objective measures were completed at Evaluation unless otherwise  noted.  DIAGNOSTIC FINDINGS:  No recent pertinent imaging available in chart  PATIENT SURVEYS:  Modified Oswestry score:  34%  Interpretation of scores: Score Category Description  0-20% Minimal Disability The patient can cope with most living activities. Usually no treatment is indicated apart from advice on lifting, sitting and exercise  21-40% Moderate Disability The patient experiences more pain and difficulty with sitting, lifting and standing. Travel and social life are more difficult and they may be disabled from work. Personal care, sexual activity and sleeping are not grossly affected, and the patient can usually be managed by conservative means  41-60% Severe Disability Pain remains the main problem in this group, but activities of daily living are affected. These patients require a detailed investigation  61-80% Crippled Back pain impinges on all aspects of the patients life. Positive intervention is required  81-100% Bed-bound  These patients are either bed-bound or exaggerating their symptoms  Bluford FORBES Zoe DELENA Karon DELENA, et al. Surgery versus conservative management of stable thoracolumbar fracture: the PRESTO feasibility RCT. [place]): [employer]; 2021 Nov. [facility] Technology Assessment, No. 25.62.) Appendix 3, Oswestry Disability Index category descriptors. Available from: [email or web address]  Minimally Clinically Important Difference (MCID) = 12.8%  COGNITION: Overall cognitive status: Within functional limits for tasks assessed, very pleasant pt can become tangential and requires redirection to activity    SENSATION: Intact to light touch BLE   MUSCLE LENGTH: deferred  POSTURE: increased thoracic kyphosis, rounded shoulders, unable to achieve full upright position due to pain, maintains increased hip and knee flexion when attempting to stand fully upright   PALPATION: deferred  Thoracolumbar AROM: Extension - unable  to achieve neutral position, remains in significant flexion  Flexion - lacking at least 25%  Rotation - lacking at least 40% bilat  and pain-limited    LOWER EXTREMITY MMT:    MMT Right eval Left eval  Hip flexion 4- 3*  Hip extension    Hip abduction 4+ 4+  Hip adduction 4+ 4+  Hip internal rotation    Hip external rotation    Knee flexion 4 3*  Knee extension 4+ 4+  Ankle dorsiflexion 4 4+  Ankle plantarflexion    Ankle inversion    Ankle eversion     (Blank rows = not tested) *=pain limited   LUMBAR SPECIAL TESTS:  deferred  FUNCTIONAL TESTS:  10 meter walk test: 0.63 m/s crouched posture, decreased hip extension, decreased step-length and heel strike, elevated R>L shoulder, heavy BUE weightbearing on 4WW  GAIT: Distance walked: 10MWT/clinic distances (see above) Assistive device utilized: Environmental Consultant - 4 wheeled Level of assistance: Modified independence Comments: gait mechanics impaired: decreased gait speed, crouched posture, decreased hip extension, decreased step-length and heel strike, elevated R>L shoulder, heavy BUE weightbearing on 4WW  10 Meter Walk Test: Patient instructed to walk 10 meters (32.8 ft) as quickly and as safely as possible at their normal speed x2 and at a fast speed x2. Time measured from 2 meter mark to 8 meter mark to accommodate ramp-up and ramp-down.  Normal speed 1: 0.66 m/s with rollator Normal speed 2: 0.66 m/s with rollator Normal speed 3: 0.68 m/s with upright 4WW Normal speed 4: 0.68 m/s with upright 4WW Average Normal speed: 0.67 m/s  Cut off scores: <0.4 m/s = household Ambulator, 0.4-0.8 m/s = limited community Ambulator, >0.8 m/s = community Ambulator, >1.2 m/s = crossing a street, <1.0 = increased fall risk MCID 0.05 m/s (small), 0.13 m/s (moderate), 0.06 m/s (significant)  (ANPTA Core Set of Outcome Measures for Adults with Neurologic Conditions, 2018)   MODIFIED OSWESTRY DISABILITY SCALE  Date: 12/11/2023  Score  Pain intensity  3 =  Pain medication provides me with moderate relief from pain.  2. Personal care (washing, dressing, etc.) 1 =  I can take care of myself normally, but it increases my pain.  3. Lifting 1 = I can lift heavy weights, but it causes increased pain.  4. Walking 4 = I can only walk with crutches or a cane.  5. Sitting 1 =  I can only sit in my favorite chair as long as I like.  6. Standing 3 =  Pain prevents me from standing more than 1/2 hour.  7. Sleeping 1 = I can sleep well only by using pain medication.  8. Social Life 2 = Pain prevents me from participating in more energetic activities (eg. sports, dancing).  9. Traveling 1 =  I can travel anywhere, but it increases my pain.  10. Employment/ Homemaking 2 = I can perform most of my homemaking/job duties, but pain prevents me from performing more physically stressful activities (eg, lifting, vacuuming).  Total 19/50 = 38%   Interpretation of scores: Score Category Description  0-20% Minimal Disability The patient can cope with most living activities. Usually no treatment is indicated apart from advice on lifting, sitting and exercise  21-40% Moderate Disability The patient experiences more pain and difficulty with sitting, lifting and standing. Travel and social life are more difficult and they may be disabled from work. Personal care, sexual activity and sleeping are not grossly affected, and the patient can usually be managed by conservative means  41-60% Severe Disability Pain remains the main problem in this group, but activities of daily living are affected. These patients require a detailed investigation  61-80% Crippled Back pain impinges on all aspects of the patients life. Positive intervention is required  81-100% Bed-bound  These patients are either bed-bound or exaggerating their symptoms  Bluford FORBES Zoe DELENA Karon DELENA, et al. Surgery versus conservative management of stable thoracolumbar fracture: the PRESTO feasibility RCT.  [place] ([place]): [employer]; 2021 Nov. [facility] Technology Assessment, No. 25.62.) Appendix 3, Oswestry Disability Index category descriptors. Available from: [email or web address].cz  Minimally Clinically Important Difference (MCID) = 12.8%   Pt performed 5 time sit<>stand (5xSTS): 21.78 sec without UE support sec (>15 sec indicates increased fall risk)      TREATMENT DATE: 03/02/2024   Self care/Home management:   Review of ambulation daily - as able with arthritis- Discussed using her 4WW for more confined areas. Patient ambulated in clinic x 175 feet with 3# AW  Review of LE strengthening HEP and low back stretching- verbally detailing expectation  and how to help manage her low back pain  Discussion of gait speed and correlation to fall risk.   Discussion of use of upright 4WW vs. Regular 4WW  Modified Oswestry score: 32%  Interpretation of scores: Score Category Description  0-20% Minimal Disability The patient can cope with most living activities. Usually no treatment is indicated apart from advice on lifting, sitting and exercise  21-40% Moderate Disability The patient experiences more pain and difficulty with sitting, lifting and standing. Travel and social life are more difficult and they may be disabled from work. Personal care, sexual activity and sleeping are not grossly affected, and the patient can usually be managed by conservative means  41-60% Severe Disability Pain remains the main problem in this group, but activities of daily living are affected. These patients require a detailed investigation  61-80% Crippled Back pain impinges on all aspects of the  patients life. Positive intervention is required  81-100% Bed-bound  These patients are either bed-bound or exaggerating their symptoms  Bluford FORBES Zoe DELENA Karon DELENA, et al. Surgery versus conservative management of stable thoracolumbar fracture: the PRESTO feasibility RCT. [place]  ([place]): [employer]; 2021 Nov. [facility] Technology Assessment, No. 25.62.) Appendix 3, Oswestry Disability Index category descriptors. Available from: [email or web address].cz  Pt performed 5 time sit<>stand (5xSTS): 03/01/2024= 12.35 sec (>15 sec indicates increased fall risk)    10 Meter Walk Test: Patient instructed to walk 10 meters (32.8 ft) as quickly and as safely as possible at their normal speed x2 and at a fast speed x2. Time measured from 2 meter mark to 8 meter mark to accommodate ramp-up and ramp-down.  Normal speed 1: 0.83 m/s Normal speed 2: 0.81 m/s Average Normal speed: 0.82 m/s Cut off scores: <0.4 m/s = household Ambulator, 0.4-0.8 m/s = limited community Ambulator, >0.8 m/s = community Ambulator, >1.2 m/s = crossing a street, <1.0 = increased fall risk MCID 0.05 m/s (small), 0.13 m/s (moderate), 0.06 m/s (significant)  (ANPTA Core Set of Outcome Measures for Adults with Neurologic Conditions, 2018)   -  PATIENT EDUCATION:  Education details: Exercise technique strategies                  Person educated: Patient Education method: Explanation Education comprehension: verbalized understanding  HOME EXERCISE PROGRAM:  Access Code: [email or web address] URL: [email or web address] Date: 01/29/2024 Prepared by: Reyes London  Exercises - Standing March with Counter Support  - 2-3 x weekly - 3 sets - 10 reps - Heel Raises with Counter Support  - 2-3 x weekly - 3 sets - 10 reps - 2 sec hold - Standing Hip Extension with Counter Support  - 2-3 x weekly - 3 sets - 10 reps - Mini Squat with Counter Support  - 2-3 x weekly - 3 sets - 10 reps - Standing Hip Abduction with Counter Support  - 2-3 x weekly - 3 sets - 10 reps - Standing Knee Flexion with Counter Support  - 2-3 x weekly - 3 sets - 10 reps      Access Code: [email or web address] URL: [email or web address] Date: 01/08/2024 Prepared by: Reyes London  Exercises -  Standing Bent Over Single Arm Scapular Row with Table Support with PLB  - 3 x weekly - 3 sets - 10 reps - Seated Shoulder Row with Anchored Resistance  - 3 x weekly - 3 sets - 10 reps - Seated Shoulder Horizontal Abduction with Dumbbells - Thumbs Up  - 3 x weekly - 3 sets - 10 reps - Seated Bicep Curls Supinated with Dumbbells  - 3 x weekly - 3 sets - 10 reps - Seated Flexion Stretch  - 3 x weekly - 3 sets - 10 reps     Access Code: [email or web address] URL: [email or web address] Date: 12/28/2023 Prepared by: Reyes London  Exercises - Seated Shoulder Pendulum Exercise  - 1 x daily - 3 sets - 10 reps - Seated Shoulder Flexion Towel Slide at Table Top  - 1 x daily - 3 sets - 10 reps - Seated Shoulder Flexion  - 1 x daily - 3 sets - 10 reps     Access Code: [email or web address] URL: [email or web address] Date: 10/05/2023 Prepared by: Lonni Gainer  Exercises - Supine Bridge  - 3 x weekly - 3 sets - 10 reps - Clamshell  - 3 x weekly - 3 sets - 10 reps - Supine Lower Trunk Rotation  - 1 x daily -  3 sets - 30 sec hold - Seated Hamstring Stretch  - 1 x daily - 3 sets - 30 hold - Seated Clamshell  - 1 x daily - 7 x weekly - 2 sets - 20 reps - 3 sec hold - Supine Transversus Abdominis Bracing - Hands on Stomach  - 3 x weekly - 3 sets - 10 reps - Supine Gluteal Sets  - 3 x weekly - 3 sets - 10 reps  ASSESSMENT:  CLINICAL IMPRESSION: Patient presented with good motivation for her last remaining PT visit. Spent time reviewing purpose of all HEP, Walking- stretching- and strengthening as ways to manage her chronic back. Reassessed remaining goals and patient continues to make steady progress- improved self report on Mod Oswestry and improved walking speed, distance (as seen last visit) and improved functional LE strength as seen by 5 x Sit to stand. Patient has met majority of goals at this time and appropriate from discharge from PT. Patient in agreement and plans on  continuing her HEP in home setting. Recommended that she continue to use her upright 4WW to promote best posture and use of low back and core musculature.   OBJECTIVE IMPAIRMENTS: Abnormal gait, decreased activity tolerance, decreased balance, decreased mobility, difficulty walking, decreased strength, hypomobility, increased edema, improper body mechanics, postural dysfunction, and pain.   ACTIVITY LIMITATIONS: carrying, lifting, bending, squatting, stairs, transfers, bed mobility, and locomotion level  PARTICIPATION LIMITATIONS: meal prep, cleaning, shopping, community activity, and yard work  PERSONAL FACTORS: Age, Fitness, Sex, Time since onset of injury/illness/exacerbation, and 3+ comorbidities: PMH per chart includes arthritis low back and left shoulder, bilateral carpal tunnel, asthma, glaucoma, HTN, leaky heart valve, sleep apnea, pre-diabetes, being seen by lymphedema specialist for BLE lymphedema  are also affecting patient's functional outcome.   REHAB POTENTIAL: Good  CLINICAL DECISION MAKING: Evolving/moderate complexity  EVALUATION COMPLEXITY: Moderate   GOALS: Goals reviewed with patient? Yes   SHORT TERM GOALS: Target date: 10/31/2023    Patient will be independent in home exercise program to improve strength/mobility for better functional independence with ADLs. Baseline:to be initiated; 11/06/2023=Patient reports knowledgeable of HEP but not always able to perform and sometimes painful so has to modify.  Goal status: MET   LONG TERM GOALS: Target date: 03/04/2024    Patient will reduce modified Oswestry score to <20 as to demonstrate minimal disability with ADLs including improved sleeping tolerance, walking/sitting tolerance etc for better mobility with ADLs.  Baseline: 34; 11/06/2023= 32; 12/11/2023= 38%; 03/01/2024= 32% Goal status: PROGRESSED BUT NOT MET  2.  Patient (> 55 years old) will complete five times sit to stand test in < 15 seconds indicating an  increased LE strength and improved balance. Baseline: 09/26/2023= 26.46 sec with heavy UE Support;  11/06/2023= 22.31 sec without UE Support; 12/11/2023 =21.78 sec without UE support  01/15/2024= 15.35 sec  03/01/2024= 12.35 sec Goal status: MET  3.  Patient will increase Berg Balance score by > 6 points to demonstrate decreased fall risk during functional activities Baseline: 09/26/2023= 31/56; 11/06/2023= 40/56 Goal status: MET  4.  Patient will increase 10 meter walk test to >1.6m/s as to improve gait speed for better community ambulation and to reduce fall risk. Baseline:  0.63 m/s with 4WW; 0.74 m/s; 12/11/2203= 0.66 m/s with 4WW and 0.68 m/s with upright 4WW 01/15/2024= 0.8 m/s with upright 4WW 02/26/24=0.77 m/s 02/29/24= 0.82 m/s Goal status: PROGRESSED BUT NOT MET  5. Patient will transition from skilled PT services to independent with gym based program  for long term plan for fitness and management of LE weakness/low back pain.   Baseline: Patient current engaged with outpatient PT services to assist with strength and low back pain management. 01/15/2024- Patient has learned some gym exercises but leaning toward more home program for greatest rehab potential- still ongoing adding activities as appropriate. 03/01/2024= Patient is knowledgeable of current HEP and reports compliant at home and working with some weights for her arms and walking.   Goal status: MET  6. Pt will increase by at least 8m (137ft) in order to demonstrate clinically significant improvement in cardiopulmonary endurance and community ambulation  Baseline: 01/15/2024= 810 feet with upright 4WW; 02/26/2024= 900 feet    Goal status: Progressed but not met       PLAN:  PT FREQUENCY: 1-2x/week  PT DURATION: 12 weeks  PLANNED INTERVENTIONS: 97164- PT Re-evaluation, 97750- Physical Performance Testing, 97110-Therapeutic exercises, 97530- Therapeutic activity, [id number]- Neuromuscular re-education, 97535- Self Care,  97140- Manual therapy, [id number]- Gait training, 02239- Orthotic Initial, [id number]- Orthotic/Prosthetic subsequent, [phone]- Canalith repositioning, Patient/Family education, Balance training, Stair training, Taping, Joint mobilization, Spinal mobilization, Vestibular training, DME instructions, Cryotherapy, and Moist heat.  PLAN FOR NEXT SESSION:  -Discharge patient today.    Chyrl London, PT Physical Therapist - [facility]  03/02/2024, 9:58 AM   "

## 2024-03-04 ENCOUNTER — Ambulatory Visit: Admitting: Occupational Therapy

## 2024-03-04 ENCOUNTER — Ambulatory Visit

## 2024-03-05 ENCOUNTER — Other Ambulatory Visit: Payer: Self-pay | Admitting: Internal Medicine

## 2024-03-05 MED ORDER — MONTELUKAST SODIUM 10 MG PO TABS: 10.0000 mg | ORAL_TABLET | Freq: Every day | ORAL | 0 refills | Status: DC

## 2024-03-06 ENCOUNTER — Ambulatory Visit: Admitting: Occupational Therapy

## 2024-03-06 ENCOUNTER — Ambulatory Visit

## 2024-03-06 ENCOUNTER — Telehealth: Payer: Self-pay

## 2024-03-06 ENCOUNTER — Other Ambulatory Visit: Payer: Self-pay

## 2024-03-06 NOTE — Telephone Encounter (Signed)
 Pt's pharmacy is requesting a holdover supply of generic Montelukast  as they are out of stock of montelukast . Pt is out of town.

## 2024-03-07 IMAGING — MG MM DIGITAL SCREENING BILAT W/ TOMO AND CAD
6 of 12 series · 6 of 36 positions shown · non-contrast
Comparison: Previous exam(s).

CLINICAL DATA: Screening.

EXAM:
DIGITAL SCREENING BILATERAL MAMMOGRAM WITH TOMOSYNTHESIS AND CAD
TECHNIQUE: Bilateral screening digital craniocaudal and mediolateral oblique
mammograms were obtained. Bilateral screening digital breast
tomosynthesis was performed. The images were evaluated with
computer-aided detection. Best images possible per technologist
communication.

[R CC synth-2D (1 of 2)]
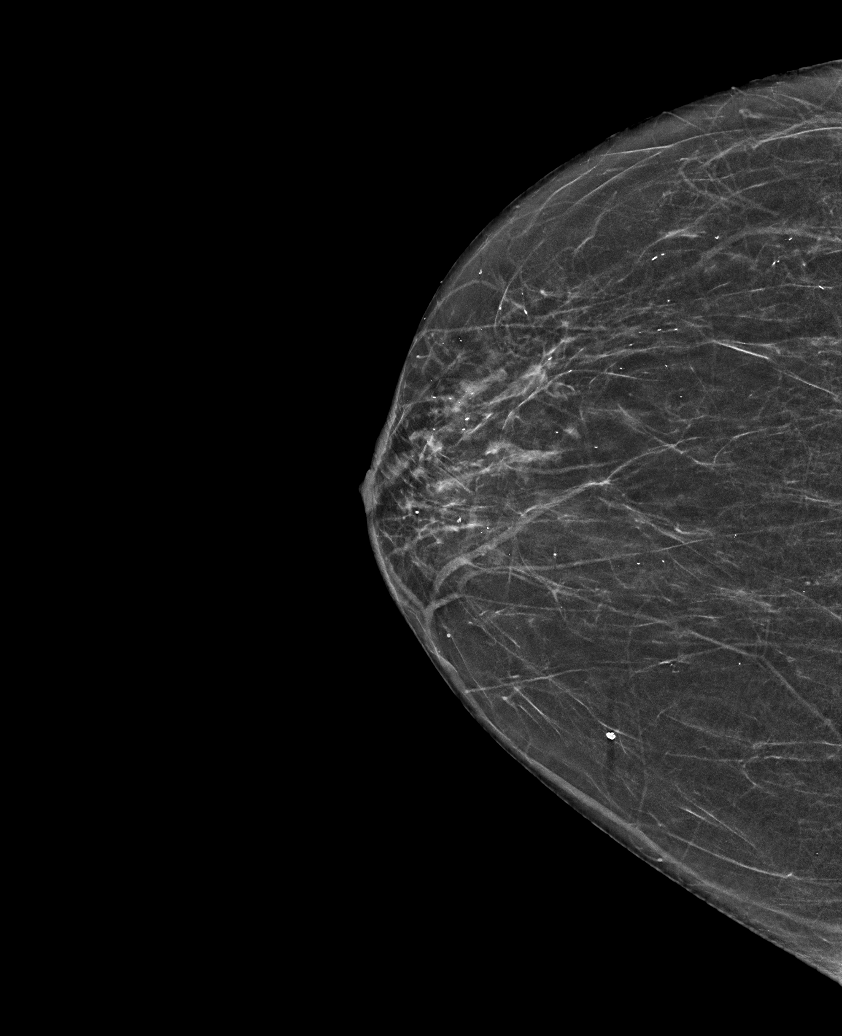

[R CC synth-2D (2 of 2)]
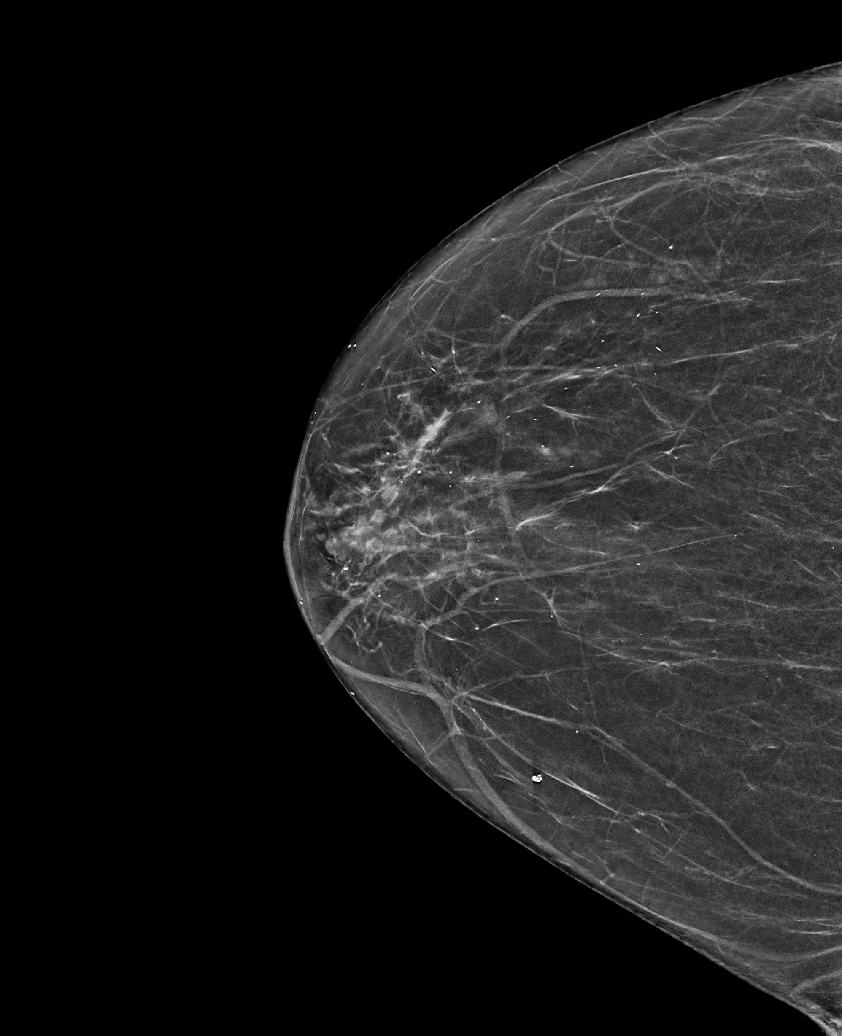

[L MLO synth-2D (1 of 2)]
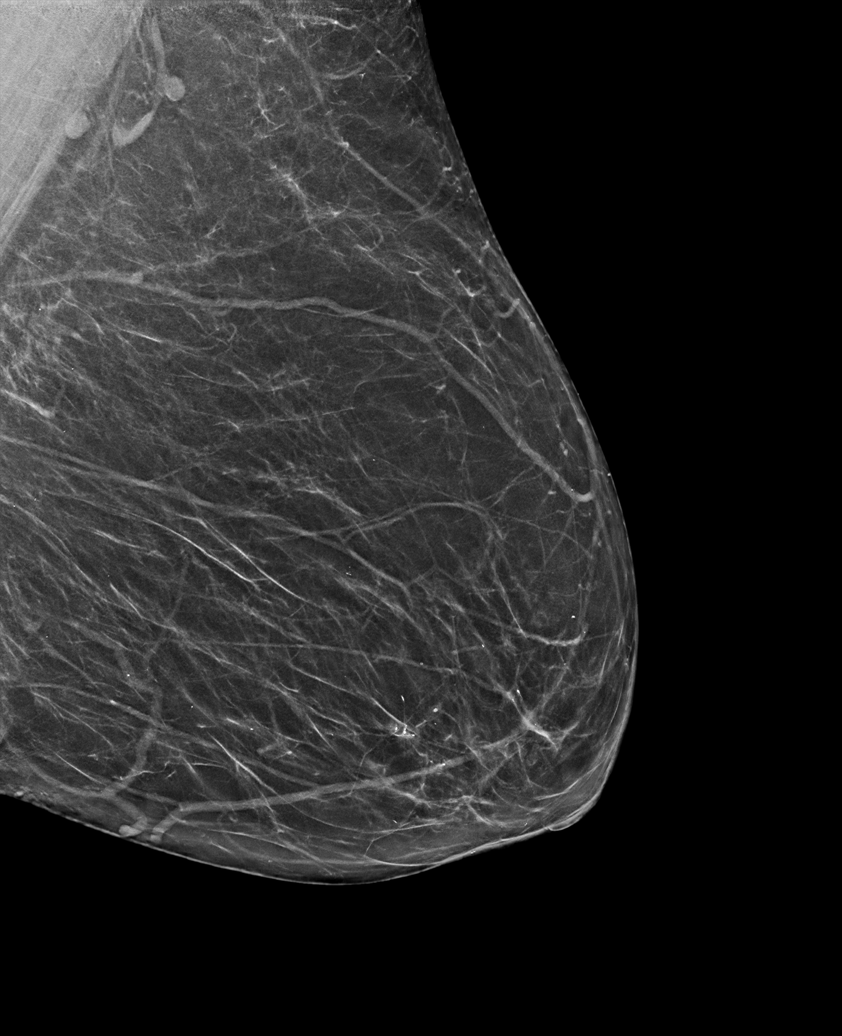

[R MLO synth-2D]
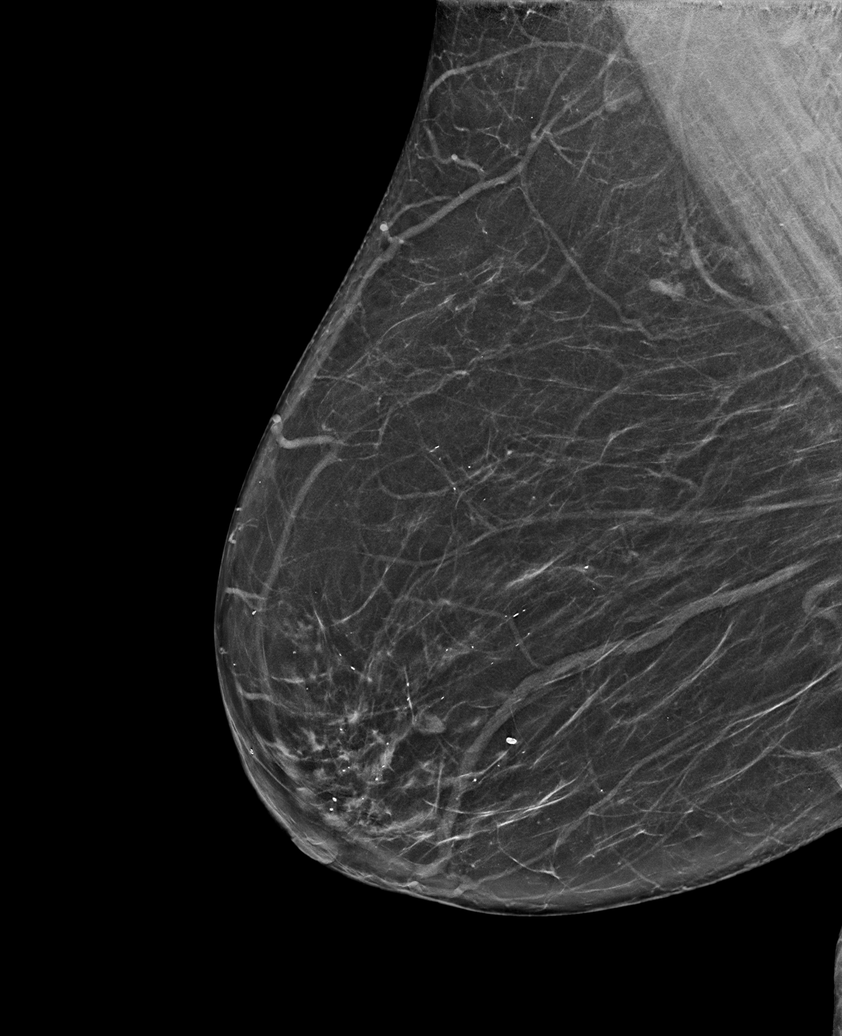

[L CC synth-2D]
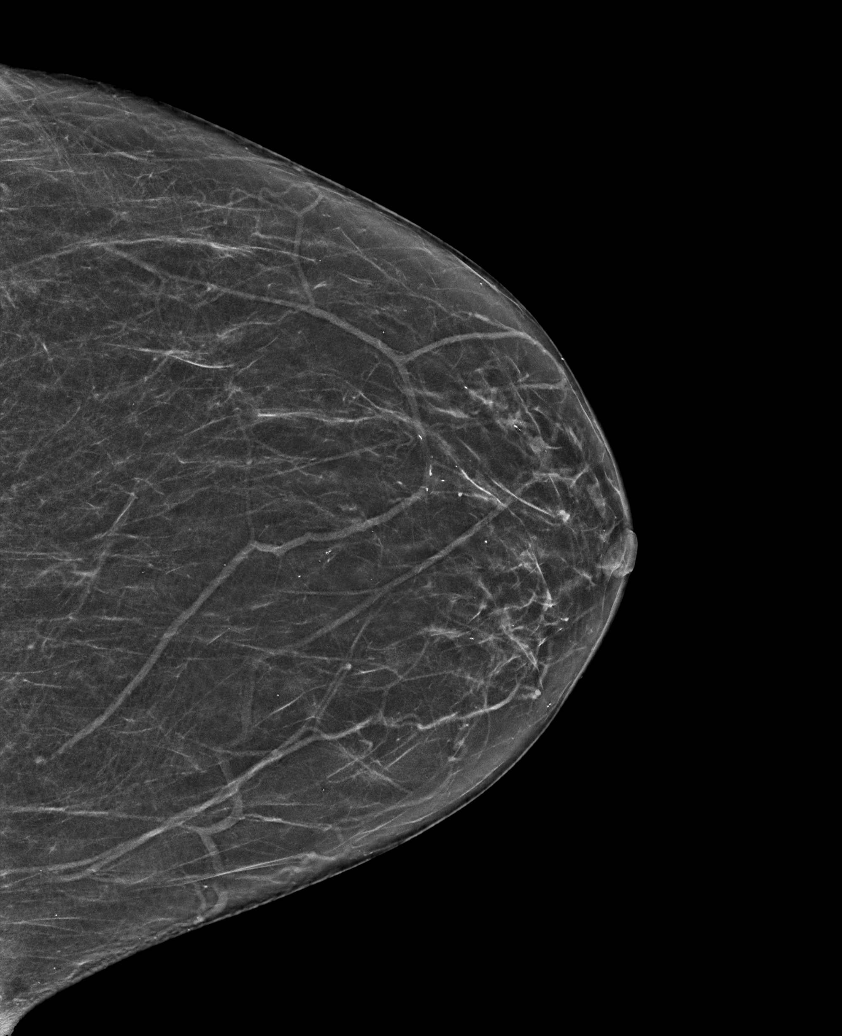

[L MLO synth-2D (2 of 2)]
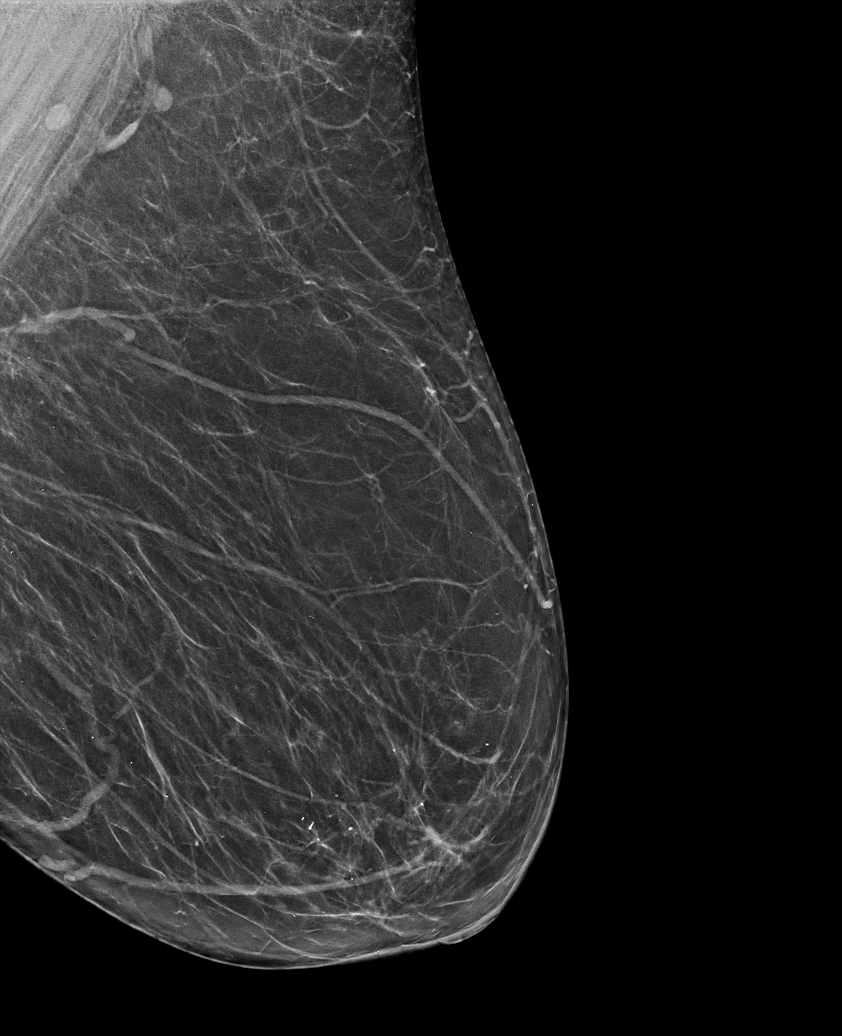

[6 of 36 positions shown; findings below may reference images not displayed]

ACR Breast Density Category b: There are scattered areas of
fibroglandular density.
FINDINGS: There are no findings suspicious for malignancy.
IMPRESSION: No mammographic evidence of malignancy. A result letter of this
screening mammogram will be mailed directly to the patient.

RECOMMENDATION:
Screening mammogram in one year. (Code:B3-[id number])

BI-RADS CATEGORY  1: Negative.

## 2024-03-11 ENCOUNTER — Ambulatory Visit: Admitting: Occupational Therapy

## 2024-03-11 ENCOUNTER — Ambulatory Visit

## 2024-03-13 ENCOUNTER — Ambulatory Visit: Admitting: Occupational Therapy

## 2024-03-13 ENCOUNTER — Other Ambulatory Visit: Payer: Self-pay | Admitting: Internal Medicine

## 2024-03-13 ENCOUNTER — Ambulatory Visit

## 2024-03-18 ENCOUNTER — Ambulatory Visit

## 2024-03-18 ENCOUNTER — Ambulatory Visit: Admitting: Occupational Therapy

## 2024-03-20 ENCOUNTER — Ambulatory Visit: Admitting: Occupational Therapy

## 2024-03-20 ENCOUNTER — Ambulatory Visit

## 2024-03-25 ENCOUNTER — Ambulatory Visit

## 2024-03-25 ENCOUNTER — Ambulatory Visit: Admitting: Occupational Therapy

## 2024-03-27 ENCOUNTER — Ambulatory Visit: Admitting: Occupational Therapy

## 2024-03-27 ENCOUNTER — Ambulatory Visit

## 2024-04-01 ENCOUNTER — Ambulatory Visit: Admitting: Occupational Therapy

## 2024-04-01 ENCOUNTER — Ambulatory Visit

## 2024-04-03 ENCOUNTER — Ambulatory Visit: Admitting: Occupational Therapy

## 2024-04-03 ENCOUNTER — Ambulatory Visit

## 2024-04-04 ENCOUNTER — Ambulatory Visit: Admitting: Internal Medicine

## 2024-04-08 ENCOUNTER — Ambulatory Visit

## 2024-04-08 ENCOUNTER — Ambulatory Visit: Admitting: Occupational Therapy

## 2024-04-08 ENCOUNTER — Ambulatory Visit: Admitting: Internal Medicine

## 2024-04-10 ENCOUNTER — Ambulatory Visit: Admitting: Occupational Therapy

## 2024-04-10 ENCOUNTER — Ambulatory Visit

## 2024-04-15 ENCOUNTER — Ambulatory Visit: Admitting: Occupational Therapy

## 2024-04-15 ENCOUNTER — Ambulatory Visit

## 2024-04-17 ENCOUNTER — Ambulatory Visit

## 2024-04-17 ENCOUNTER — Ambulatory Visit: Admitting: Occupational Therapy

## 2024-04-18 ENCOUNTER — Ambulatory Visit: Admitting: Internal Medicine

## 2024-04-22 ENCOUNTER — Ambulatory Visit

## 2024-04-22 ENCOUNTER — Ambulatory Visit: Admitting: Occupational Therapy

## 2024-04-24 ENCOUNTER — Ambulatory Visit

## 2024-04-24 ENCOUNTER — Ambulatory Visit: Admitting: Occupational Therapy

## 2024-04-29 ENCOUNTER — Ambulatory Visit

## 2024-04-29 ENCOUNTER — Ambulatory Visit: Admitting: Occupational Therapy

## 2024-05-01 ENCOUNTER — Ambulatory Visit

## 2024-05-06 ENCOUNTER — Encounter: Admitting: Occupational Therapy

## 2024-05-06 ENCOUNTER — Ambulatory Visit

## 2024-05-08 ENCOUNTER — Ambulatory Visit

## 2024-05-13 ENCOUNTER — Ambulatory Visit

## 2024-05-13 ENCOUNTER — Encounter: Admitting: Occupational Therapy

## 2024-05-15 ENCOUNTER — Ambulatory Visit

## 2024-05-20 ENCOUNTER — Encounter: Admitting: Occupational Therapy

## 2024-05-20 ENCOUNTER — Ambulatory Visit

## 2024-05-22 ENCOUNTER — Ambulatory Visit

## 2024-05-27 ENCOUNTER — Encounter: Admitting: Occupational Therapy

## 2024-05-27 ENCOUNTER — Ambulatory Visit

## 2024-05-29 ENCOUNTER — Ambulatory Visit

## 2024-06-03 ENCOUNTER — Ambulatory Visit

## 2024-06-03 ENCOUNTER — Encounter: Admitting: Occupational Therapy

## 2024-06-05 ENCOUNTER — Ambulatory Visit

## 2024-06-10 ENCOUNTER — Encounter: Admitting: Occupational Therapy

## 2024-06-10 ENCOUNTER — Ambulatory Visit

## 2024-06-12 ENCOUNTER — Ambulatory Visit

## 2024-06-17 ENCOUNTER — Ambulatory Visit

## 2024-06-17 ENCOUNTER — Encounter: Admitting: Occupational Therapy

## 2024-06-19 ENCOUNTER — Ambulatory Visit

## 2024-06-24 ENCOUNTER — Encounter: Admitting: Occupational Therapy

## 2024-06-24 ENCOUNTER — Ambulatory Visit

## 2024-06-26 ENCOUNTER — Ambulatory Visit

## 2024-07-01 ENCOUNTER — Encounter: Admitting: Occupational Therapy

## 2024-07-01 ENCOUNTER — Ambulatory Visit

## 2024-07-03 ENCOUNTER — Ambulatory Visit

## 2024-07-08 ENCOUNTER — Encounter: Admitting: Occupational Therapy

## 2024-07-08 ENCOUNTER — Ambulatory Visit

## 2024-07-10 ENCOUNTER — Ambulatory Visit

## 2024-07-15 ENCOUNTER — Ambulatory Visit

## 2024-07-15 ENCOUNTER — Encounter: Admitting: Occupational Therapy

## 2024-07-17 ENCOUNTER — Ambulatory Visit

## 2024-07-22 ENCOUNTER — Ambulatory Visit

## 2024-07-22 ENCOUNTER — Encounter: Admitting: Occupational Therapy
